# Patient Record
Sex: Female | Born: 1940 | Race: White | Hispanic: No | Marital: Married | Smoking: Former smoker
Health system: Southern US, Community
[De-identification: ages and names within clinical notes are randomized; demographics above are authoritative.]

## PROBLEM LIST (undated history)

## (undated) DIAGNOSIS — I82629 Acute embolism and thrombosis of deep veins of unspecified upper extremity: Secondary | ICD-10-CM

## (undated) DIAGNOSIS — H547 Unspecified visual loss: Secondary | ICD-10-CM

## (undated) DIAGNOSIS — M8580 Other specified disorders of bone density and structure, unspecified site: Secondary | ICD-10-CM

## (undated) DIAGNOSIS — I4719 Other supraventricular tachycardia: Secondary | ICD-10-CM

## (undated) DIAGNOSIS — R609 Edema, unspecified: Secondary | ICD-10-CM

## (undated) DIAGNOSIS — R011 Cardiac murmur, unspecified: Secondary | ICD-10-CM

## (undated) DIAGNOSIS — I83893 Varicose veins of bilateral lower extremities with other complications: Secondary | ICD-10-CM

## (undated) DIAGNOSIS — R296 Repeated falls: Secondary | ICD-10-CM

## (undated) DIAGNOSIS — I471 Supraventricular tachycardia: Secondary | ICD-10-CM

## (undated) DIAGNOSIS — S52022A Displaced fracture of olecranon process without intraarticular extension of left ulna, initial encounter for closed fracture: Secondary | ICD-10-CM

## (undated) DIAGNOSIS — K219 Gastro-esophageal reflux disease without esophagitis: Secondary | ICD-10-CM

## (undated) DIAGNOSIS — R519 Headache, unspecified: Secondary | ICD-10-CM

## (undated) DIAGNOSIS — R0600 Dyspnea, unspecified: Secondary | ICD-10-CM

## (undated) DIAGNOSIS — G8929 Other chronic pain: Secondary | ICD-10-CM

## (undated) DIAGNOSIS — I8390 Asymptomatic varicose veins of unspecified lower extremity: Secondary | ICD-10-CM

## (undated) DIAGNOSIS — T7840XA Allergy, unspecified, initial encounter: Secondary | ICD-10-CM

## (undated) DIAGNOSIS — I509 Heart failure, unspecified: Secondary | ICD-10-CM

## (undated) DIAGNOSIS — M47812 Spondylosis without myelopathy or radiculopathy, cervical region: Secondary | ICD-10-CM

## (undated) DIAGNOSIS — F32A Depression, unspecified: Secondary | ICD-10-CM

## (undated) DIAGNOSIS — R51 Headache: Secondary | ICD-10-CM

## (undated) DIAGNOSIS — B9689 Other specified bacterial agents as the cause of diseases classified elsewhere: Secondary | ICD-10-CM

## (undated) DIAGNOSIS — J45909 Unspecified asthma, uncomplicated: Secondary | ICD-10-CM

## (undated) DIAGNOSIS — N760 Acute vaginitis: Secondary | ICD-10-CM

## (undated) DIAGNOSIS — I10 Essential (primary) hypertension: Secondary | ICD-10-CM

## (undated) DIAGNOSIS — D649 Anemia, unspecified: Secondary | ICD-10-CM

## (undated) DIAGNOSIS — F329 Major depressive disorder, single episode, unspecified: Secondary | ICD-10-CM

## (undated) DIAGNOSIS — G629 Polyneuropathy, unspecified: Secondary | ICD-10-CM

## (undated) DIAGNOSIS — M797 Fibromyalgia: Secondary | ICD-10-CM

## (undated) DIAGNOSIS — E559 Vitamin D deficiency, unspecified: Secondary | ICD-10-CM

## (undated) DIAGNOSIS — J189 Pneumonia, unspecified organism: Secondary | ICD-10-CM

## (undated) HISTORY — PX: COLONOSCOPY: SHX174

## (undated) HISTORY — DX: Major depressive disorder, single episode, unspecified: F32.9

## (undated) HISTORY — PX: BACK SURGERY: SHX140

## (undated) HISTORY — PX: TONSILLECTOMY AND ADENOIDECTOMY: SUR1326

## (undated) HISTORY — DX: Varicose veins of bilateral lower extremities with other complications: I83.893

## (undated) HISTORY — DX: Unspecified asthma, uncomplicated: J45.909

## (undated) HISTORY — PX: WRIST SURGERY: SHX841

## (undated) HISTORY — PX: SHOULDER SURGERY: SHX246

## (undated) HISTORY — DX: Depression, unspecified: F32.A

## (undated) HISTORY — DX: Allergy, unspecified, initial encounter: T78.40XA

## (undated) HISTORY — DX: Unspecified visual loss: H54.7

## (undated) HISTORY — PX: KNEE SURGERY: SHX244

## (undated) HISTORY — DX: Spondylosis without myelopathy or radiculopathy, cervical region: M47.812

## (undated) HISTORY — DX: Acute embolism and thrombosis of deep veins of unspecified upper extremity: I82.629

## (undated) HISTORY — DX: Gastro-esophageal reflux disease without esophagitis: K21.9

## (undated) HISTORY — DX: Vitamin D deficiency, unspecified: E55.9

## (undated) HISTORY — DX: Acute vaginitis: N76.0

## (undated) HISTORY — PX: APPENDECTOMY: SHX54

## (undated) HISTORY — DX: Fibromyalgia: M79.7

## (undated) HISTORY — DX: Other specified bacterial agents as the cause of diseases classified elsewhere: B96.89

## (undated) HISTORY — PX: FOOT SURGERY: SHX648

## (undated) HISTORY — PX: FINGER SURGERY: SHX640

## (undated) HISTORY — DX: Other specified disorders of bone density and structure, unspecified site: M85.80

## (undated) HISTORY — DX: Edema, unspecified: R60.9

## (undated) HISTORY — DX: Other chronic pain: G89.29

## (undated) HISTORY — DX: Asymptomatic varicose veins of unspecified lower extremity: I83.90

---

## 1997-02-22 HISTORY — PX: ABDOMINAL HYSTERECTOMY: SHX81

## 1997-09-16 ENCOUNTER — Ambulatory Visit (HOSPITAL_COMMUNITY): Admission: RE | Admit: 1997-09-16 | Discharge: 1997-09-16 | Payer: Self-pay | Admitting: Obstetrics and Gynecology

## 1997-11-25 ENCOUNTER — Encounter: Payer: Self-pay | Admitting: Orthopedic Surgery

## 1997-11-25 ENCOUNTER — Ambulatory Visit (HOSPITAL_COMMUNITY): Admission: RE | Admit: 1997-11-25 | Discharge: 1997-11-25 | Payer: Self-pay | Admitting: Orthopedic Surgery

## 1998-02-22 HISTORY — PX: CATARACT EXTRACTION: SUR2

## 1998-10-26 ENCOUNTER — Encounter: Payer: Self-pay | Admitting: Emergency Medicine

## 1998-10-26 ENCOUNTER — Emergency Department (HOSPITAL_COMMUNITY): Admission: EM | Admit: 1998-10-26 | Discharge: 1998-10-26 | Payer: Self-pay | Admitting: Emergency Medicine

## 1998-12-29 ENCOUNTER — Ambulatory Visit (HOSPITAL_COMMUNITY): Admission: RE | Admit: 1998-12-29 | Discharge: 1998-12-29 | Payer: Self-pay | Admitting: Orthopedic Surgery

## 1999-01-03 ENCOUNTER — Inpatient Hospital Stay (HOSPITAL_COMMUNITY): Admission: EM | Admit: 1999-01-03 | Discharge: 1999-01-07 | Payer: Self-pay | Admitting: Emergency Medicine

## 1999-01-06 ENCOUNTER — Encounter: Payer: Self-pay | Admitting: Orthopedic Surgery

## 1999-03-13 ENCOUNTER — Other Ambulatory Visit: Admission: RE | Admit: 1999-03-13 | Discharge: 1999-03-13 | Payer: Self-pay | Admitting: Obstetrics and Gynecology

## 1999-03-25 ENCOUNTER — Encounter: Payer: Self-pay | Admitting: Obstetrics and Gynecology

## 1999-03-25 ENCOUNTER — Encounter: Admission: RE | Admit: 1999-03-25 | Discharge: 1999-03-25 | Payer: Self-pay | Admitting: Obstetrics and Gynecology

## 1999-04-07 ENCOUNTER — Encounter: Payer: Self-pay | Admitting: Orthopedic Surgery

## 1999-04-07 ENCOUNTER — Ambulatory Visit (HOSPITAL_COMMUNITY): Admission: RE | Admit: 1999-04-07 | Discharge: 1999-04-07 | Payer: Self-pay | Admitting: Orthopedic Surgery

## 1999-04-22 ENCOUNTER — Encounter: Admission: RE | Admit: 1999-04-22 | Discharge: 1999-04-22 | Payer: Self-pay | Admitting: Orthopedic Surgery

## 1999-04-22 ENCOUNTER — Encounter: Payer: Self-pay | Admitting: Orthopedic Surgery

## 1999-06-09 ENCOUNTER — Encounter: Admission: RE | Admit: 1999-06-09 | Discharge: 1999-06-09 | Payer: Self-pay | Admitting: *Deleted

## 1999-06-09 ENCOUNTER — Encounter: Payer: Self-pay | Admitting: Rheumatology

## 2000-01-22 ENCOUNTER — Encounter: Payer: Self-pay | Admitting: Orthopedic Surgery

## 2000-01-22 ENCOUNTER — Encounter: Admission: RE | Admit: 2000-01-22 | Discharge: 2000-01-22 | Payer: Self-pay | Admitting: Orthopedic Surgery

## 2000-03-14 ENCOUNTER — Other Ambulatory Visit: Admission: RE | Admit: 2000-03-14 | Discharge: 2000-03-14 | Payer: Self-pay | Admitting: Obstetrics and Gynecology

## 2000-06-08 ENCOUNTER — Encounter: Payer: Self-pay | Admitting: Obstetrics and Gynecology

## 2000-06-08 ENCOUNTER — Encounter: Admission: RE | Admit: 2000-06-08 | Discharge: 2000-06-08 | Payer: Self-pay | Admitting: Obstetrics and Gynecology

## 2001-04-17 ENCOUNTER — Other Ambulatory Visit: Admission: RE | Admit: 2001-04-17 | Discharge: 2001-04-17 | Payer: Self-pay | Admitting: Gynecology

## 2001-06-29 ENCOUNTER — Ambulatory Visit (HOSPITAL_COMMUNITY): Admission: RE | Admit: 2001-06-29 | Discharge: 2001-06-29 | Payer: Self-pay | Admitting: Gastroenterology

## 2001-11-02 ENCOUNTER — Emergency Department (HOSPITAL_COMMUNITY): Admission: EM | Admit: 2001-11-02 | Discharge: 2001-11-02 | Payer: Self-pay

## 2002-04-18 ENCOUNTER — Other Ambulatory Visit: Admission: RE | Admit: 2002-04-18 | Discharge: 2002-04-18 | Payer: Self-pay | Admitting: Gynecology

## 2002-04-23 ENCOUNTER — Encounter: Payer: Self-pay | Admitting: Gynecology

## 2002-04-23 ENCOUNTER — Encounter: Admission: RE | Admit: 2002-04-23 | Discharge: 2002-04-23 | Payer: Self-pay | Admitting: Gynecology

## 2002-06-04 ENCOUNTER — Emergency Department (HOSPITAL_COMMUNITY): Admission: EM | Admit: 2002-06-04 | Discharge: 2002-06-04 | Payer: Self-pay

## 2002-06-04 ENCOUNTER — Encounter: Payer: Self-pay | Admitting: Emergency Medicine

## 2002-06-04 ENCOUNTER — Encounter: Payer: Self-pay | Admitting: Orthopedic Surgery

## 2002-12-04 ENCOUNTER — Ambulatory Visit: Admission: RE | Admit: 2002-12-04 | Discharge: 2002-12-04 | Payer: Self-pay | Admitting: Orthopedic Surgery

## 2003-02-01 ENCOUNTER — Ambulatory Visit (HOSPITAL_COMMUNITY): Admission: RE | Admit: 2003-02-01 | Discharge: 2003-02-01 | Payer: Self-pay | Admitting: Orthopedic Surgery

## 2003-05-08 ENCOUNTER — Other Ambulatory Visit: Admission: RE | Admit: 2003-05-08 | Discharge: 2003-05-08 | Payer: Self-pay | Admitting: Gynecology

## 2003-05-24 ENCOUNTER — Ambulatory Visit (HOSPITAL_BASED_OUTPATIENT_CLINIC_OR_DEPARTMENT_OTHER): Admission: RE | Admit: 2003-05-24 | Discharge: 2003-05-24 | Payer: Self-pay | Admitting: Orthopedic Surgery

## 2003-07-23 ENCOUNTER — Encounter: Admission: RE | Admit: 2003-07-23 | Discharge: 2003-07-23 | Payer: Self-pay | Admitting: Internal Medicine

## 2003-08-13 ENCOUNTER — Ambulatory Visit (HOSPITAL_COMMUNITY): Admission: RE | Admit: 2003-08-13 | Discharge: 2003-08-13 | Payer: Self-pay | Admitting: Orthopedic Surgery

## 2003-10-06 ENCOUNTER — Observation Stay (HOSPITAL_COMMUNITY): Admission: EM | Admit: 2003-10-06 | Discharge: 2003-10-08 | Payer: Self-pay | Admitting: Emergency Medicine

## 2003-12-03 ENCOUNTER — Inpatient Hospital Stay (HOSPITAL_COMMUNITY): Admission: RE | Admit: 2003-12-03 | Discharge: 2003-12-08 | Payer: Self-pay | Admitting: Orthopedic Surgery

## 2003-12-04 ENCOUNTER — Ambulatory Visit: Payer: Self-pay | Admitting: Physical Medicine & Rehabilitation

## 2004-01-07 ENCOUNTER — Encounter: Admission: RE | Admit: 2004-01-07 | Discharge: 2004-04-06 | Payer: Self-pay | Admitting: Orthopedic Surgery

## 2004-04-29 ENCOUNTER — Other Ambulatory Visit: Admission: RE | Admit: 2004-04-29 | Discharge: 2004-04-29 | Payer: Self-pay | Admitting: Gynecology

## 2004-07-23 ENCOUNTER — Encounter: Admission: RE | Admit: 2004-07-23 | Discharge: 2004-07-23 | Payer: Self-pay | Admitting: Gynecology

## 2004-08-21 ENCOUNTER — Emergency Department (HOSPITAL_COMMUNITY): Admission: EM | Admit: 2004-08-21 | Discharge: 2004-08-21 | Payer: Self-pay | Admitting: Emergency Medicine

## 2004-09-15 ENCOUNTER — Observation Stay (HOSPITAL_COMMUNITY): Admission: RE | Admit: 2004-09-15 | Discharge: 2004-09-16 | Payer: Self-pay | Admitting: Orthopedic Surgery

## 2004-10-29 ENCOUNTER — Encounter: Admission: RE | Admit: 2004-10-29 | Discharge: 2005-01-27 | Payer: Self-pay | Admitting: Orthopedic Surgery

## 2005-02-02 ENCOUNTER — Encounter: Admission: RE | Admit: 2005-02-02 | Discharge: 2005-05-03 | Payer: Self-pay | Admitting: Orthopedic Surgery

## 2005-02-22 HISTORY — PX: NECK SURGERY: SHX720

## 2005-02-22 HISTORY — PX: REFRACTIVE SURGERY: SHX103

## 2005-03-24 ENCOUNTER — Encounter: Admission: RE | Admit: 2005-03-24 | Discharge: 2005-03-24 | Payer: Self-pay | Admitting: Anesthesiology

## 2005-08-22 LAB — HM DEXA SCAN

## 2005-09-21 ENCOUNTER — Encounter: Admission: RE | Admit: 2005-09-21 | Discharge: 2005-09-21 | Payer: Self-pay | Admitting: Family Medicine

## 2006-03-28 ENCOUNTER — Encounter: Admission: RE | Admit: 2006-03-28 | Discharge: 2006-03-28 | Payer: Self-pay | Admitting: Family Medicine

## 2006-08-17 ENCOUNTER — Ambulatory Visit (HOSPITAL_COMMUNITY): Admission: RE | Admit: 2006-08-17 | Discharge: 2006-08-17 | Payer: Self-pay | Admitting: Anesthesiology

## 2006-12-13 ENCOUNTER — Encounter: Admission: RE | Admit: 2006-12-13 | Discharge: 2006-12-13 | Payer: Self-pay | Admitting: Chiropractic Medicine

## 2007-02-03 ENCOUNTER — Encounter: Admission: RE | Admit: 2007-02-03 | Discharge: 2007-02-03 | Payer: Self-pay | Admitting: Anesthesiology

## 2007-07-24 HISTORY — PX: SPINAL FUSION: SHX223

## 2008-08-27 ENCOUNTER — Inpatient Hospital Stay (HOSPITAL_COMMUNITY): Admission: EM | Admit: 2008-08-27 | Discharge: 2008-08-29 | Payer: Self-pay | Admitting: Emergency Medicine

## 2009-05-12 ENCOUNTER — Encounter: Admission: RE | Admit: 2009-05-12 | Discharge: 2009-05-12 | Payer: Self-pay | Admitting: Orthopedic Surgery

## 2009-05-21 ENCOUNTER — Ambulatory Visit (HOSPITAL_BASED_OUTPATIENT_CLINIC_OR_DEPARTMENT_OTHER): Admission: RE | Admit: 2009-05-21 | Discharge: 2009-05-21 | Payer: Self-pay | Admitting: Orthopedic Surgery

## 2009-08-30 ENCOUNTER — Encounter: Admission: RE | Admit: 2009-08-30 | Discharge: 2009-08-30 | Payer: Self-pay | Admitting: Anesthesiology

## 2009-09-03 ENCOUNTER — Encounter: Admission: RE | Admit: 2009-09-03 | Discharge: 2009-09-03 | Payer: Self-pay | Admitting: Orthopaedic Surgery

## 2009-11-27 ENCOUNTER — Encounter: Admission: RE | Admit: 2009-11-27 | Discharge: 2009-11-27 | Payer: Self-pay | Admitting: Orthopaedic Surgery

## 2010-03-15 ENCOUNTER — Encounter: Payer: Self-pay | Admitting: Internal Medicine

## 2010-05-18 LAB — WOUND CULTURE: Culture: NO GROWTH

## 2010-05-18 LAB — ANAEROBIC CULTURE

## 2010-05-31 LAB — CBC
HCT: 32.1 % — ABNORMAL LOW (ref 36.0–46.0)
HCT: 34.4 % — ABNORMAL LOW (ref 36.0–46.0)
Hemoglobin: 10.7 g/dL — ABNORMAL LOW (ref 12.0–15.0)
Hemoglobin: 11.7 g/dL — ABNORMAL LOW (ref 12.0–15.0)
MCHC: 33.3 g/dL (ref 30.0–36.0)
Platelets: 404 10*3/uL — ABNORMAL HIGH (ref 150–400)
RBC: 3.88 MIL/uL (ref 3.87–5.11)
RDW: 13.8 % (ref 11.5–15.5)
WBC: 5.8 10*3/uL (ref 4.0–10.5)

## 2010-05-31 LAB — DIFFERENTIAL
Basophils Absolute: 0 10*3/uL (ref 0.0–0.1)
Basophils Relative: 1 % (ref 0–1)
Eosinophils Relative: 2 % (ref 0–5)
Eosinophils Relative: 2 % (ref 0–5)
Lymphocytes Relative: 30 % (ref 12–46)
Lymphocytes Relative: 35 % (ref 12–46)
Lymphs Abs: 1.8 10*3/uL (ref 0.7–4.0)
Monocytes Absolute: 0.5 10*3/uL (ref 0.1–1.0)
Monocytes Absolute: 0.5 10*3/uL (ref 0.1–1.0)
Monocytes Relative: 8 % (ref 3–12)

## 2010-05-31 LAB — BASIC METABOLIC PANEL
BUN: 16 mg/dL (ref 6–23)
BUN: 19 mg/dL (ref 6–23)
CO2: 27 mEq/L (ref 19–32)
Calcium: 8.7 mg/dL (ref 8.4–10.5)
Calcium: 9.6 mg/dL (ref 8.4–10.5)
Chloride: 107 mEq/L (ref 96–112)
Creatinine, Ser: 0.7 mg/dL (ref 0.4–1.2)
GFR calc Af Amer: >60 mL/min (ref >60)
GFR calc non Af Amer: >60 mL/min (ref >60)
GFR calc non Af Amer: >60 mL/min (ref >60)
Glucose, Bld: 103 mg/dL — ABNORMAL HIGH (ref 70–99)
Glucose, Bld: 89 mg/dL (ref 70–99)

## 2010-05-31 LAB — GRAM STAIN

## 2010-05-31 LAB — CULTURE, ROUTINE-ABSCESS

## 2010-05-31 LAB — ANAEROBIC CULTURE

## 2010-05-31 LAB — SEDIMENTATION RATE: Sed Rate: 101 mm/hr — ABNORMAL HIGH (ref 0–22)

## 2010-07-07 NOTE — Op Note (Signed)
NAMEADAM, SCHEPPS                 ACCOUNT NO.:  [id number]   MEDICAL RECORD NO.:  [id number]          PATIENT TYPE:  INP   LOCATION:  [place]                         FACILITY:  [facility]   PHYSICIAN:  Doran Heater. Veverly Fells, M.D. DATE OF BIRTH:  04-25-1940   DATE OF PROCEDURE:  08/27/2008  DATE OF DISCHARGE:                               OPERATIVE REPORT   PREOPERATIVE DIAGNOSIS:  Right foot infection.   POSTOPERATIVE DIAGNOSIS:  Right foot infection.   PROCEDURE PERFORMED:  Irrigation and debridement of right foot, deep.   ATTENDING SURGEON:  Doran Heater. Veverly Fells, M.D.   ASSISTANT:  None.   ANESTHESIA:  General anesthesia was used.   ESTIMATED BLOOD LOSS:  Minimal.   FLUID REPLACEMENT:  Crystalloid 100 mL.   TOURNIQUET TIME:  Eighteen minutes at 300 mmHg.   INSTRUMENT COUNT:  Correct.   COMPLICATIONS:  None.   Perioperative antibiotics were given after cultures obtained.   INDICATIONS:  The patient is a 70 year old female about 2 months out  from a right foot surgery from Dr. Weber Cooks.  The patient had, had a  bunionectomy and a metatarsal osteotomy.  The patient had developed a  cellulitis that she was seen for last week by Dr. Beola Cord.  The patient  was placed on warm soaks and also on oral antibiotics.  The patient was  seen last Tuesday, which was 1 week ago.  The patient subsequently  stated by Friday her foot had become more swollen and more red.  She  thought that she could wait through the weekend.  Unfortunately, over  the weekend, her foot got worse and worse to the point where she was  having debilitating pain, extreme swelling and redness to the plantar  aspect of the metatarsophalangeal joint area.  The patient was seen in  the emergency department and was noted to have desquamation of the  plantar aspect of the first metatarsophalangeal joint with severe  swelling and erythema.  The patient is now ready for surgery for I and  D.  Informed consent obtained.   DESCRIPTION OF SURGERY:  After an adequate level of anesthesia was  achieved the patient had a nonsterile tourniquet placed on the right  proximal thigh.  The right leg was sterilely prepped and draped in the  usual manner.  After elevating the limb for 3 minutes, we elevated the  tourniquet to 300 mmHg.  A medial skin incision was created over the  medial border of the foot over the metatarsophalangeal joint of the  great toe.  I immediately encountered pus which was coming from the  plantar pad area underneath the metatarsophalangeal joint.  There was  quite a bit of purulent material expressed.  The desquamated skin was  taken off sharply with scissors.  There was noted to be hole in the  plantar aspect of her foot that communicated with the infected area  beneath the joint.  Her joint was not involved.  We did a sharp  debridement initially of all nonviable tissue.  We were careful in  trying to preserve the dorsal nerve root there  on that medial side.  Again, all nonviable tissue was removed.  We thoroughly irrigated.  Then  we went ahead and injected the metatarsophalangeal joint from a dorsal  approach with saline that did not extravasate into the wound, thus the  joint was closed and we did not make any attempt to open the joint.  The  joint looked normal on x-ray.  Following a third-degree debridement of  the plantar pad the skin surrounding the area where the foot opened up  underneath and from the side wound, we pulse irrigated with 3 liters of  normal saline irrigation.  We then placed a gauze sponge from the medial  wound through the plantar wound as a drain and then placed dry sponges  between the toes.  A large bulky bandage for the foot and then the foot  and ankle placed in a short-leg splint.  The patient tolerated the  procedure well.  Tourniquet deflated at 18 minutes and cultures were  obtained including stat Gram stain, aerobic and anaerobic cultures.  We  will await  those culture results and start her on empiric Unasyn  starting right after the culturing.      Doran Heater. Veverly Fells, M.D.  Electronically Signed     SRN/MEDQ  D:  08/27/2008  T:  08/28/2008  Job:  [id number]   cc:   Weber Cooks, M.D.  Fax: [phone]

## 2010-07-10 NOTE — Op Note (Signed)
[name], [name]                 ACCOUNT NO.:  [id number]   MEDICAL RECORD NO.:  [id number]          PATIENT TYPE:  AMB   LOCATION:  DAY                          FACILITY:  [facility]   PHYSICIAN:  Kipp Brood. Gioffre, M.D.DATE OF BIRTH:  1941/01/24   DATE OF PROCEDURE:  09/15/2004  DATE OF DISCHARGE:                                 OPERATIVE REPORT   SURGEON:  Kipp Brood. Gladstone Lighter, M.D.   ASSISTANT:  Elby Showers. Paitsel, P.A.   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 She previously had rotator cuff tendon repair with Mitek anchor  sutures by another surgeon in town in 1999. She did well for several years  and then suddenly developed pain and limited motion of her shoulder on the  left.   POSTOPERATIVE DIAGNOSIS:  Recurrent tear of the previous repaired rotator  cuff tendon on the left shoulder.   OPERATION:  1.  Partial acromionectomy and acromioplasty, left shoulder.  2.  Lysis of adhesions, left shoulder.  3.  Repair of a previously torn and repaired rotator cuff tendon, left      shoulder, utilizing the Restore tendon graft.   PROCEDURE:  Prior to general anesthesia, the patient had interscalene nerve  block. Following that, she was brought back to the operating room, and  general anesthetic and a routine orthopedic prepping and draping of her left  shoulder was carried out. She has 1 gram of IV Ancef. An incision was made  through the old incision site. Bleeders identified and cauterized. I then  went down and sized the deltoid tendon and the proximal portion of the  deltoid muscle. This was quite scarred down. I freed the area up, and then I  literally incised the adhesions in the subacromial space. Once this was  done, I noted her acromion was sharp and down growing, so I went ahead and  protected the underlying tendon and did a partial acromionectomy and an  acromioplasty in usual fashion. Following this, I exerted some traction on  the  arm and then identified a large longitudinal tendon proximally. I sewed  the first tear end to tendon proximally. I sutured that with #1 Ethibond  suture. The remaining tendon distally was torn and shredded, and I simply  reinforced with a Restore tendon graft. The proximal deltoid tendon portion  of that was absent from a previous scar and surgery, and I reinforced that  with a portion of the Restore tendon graft as well. The remaining part of  the wound was closed in layered usual fashion after we thoroughly irrigated  out the shoulder. Skin was closed with metal staples. Sterile dressings were  applied. She was placed in a shoulder immobilizer.       RAG/MEDQ  D:  09/15/2004  T:  09/15/2004  Job:  [id number]

## 2010-07-10 NOTE — Op Note (Signed)
NAMEANI, PATENAUDE                 ACCOUNT NO.:  [id number]   MEDICAL RECORD NO.:  [id number]          PATIENT TYPE:  INP   LOCATION:  [place]                         FACILITY:  [facility]   PHYSICIAN:  Kipp Brood. Gioffre, M.D.DATE OF BIRTH:  [birth year]   DATE OF PROCEDURE:  12/03/2003  DATE OF DISCHARGE:                                 OPERATIVE REPORT   SURGEON:  Dr. Gladstone Lighter   ASSISTANT:  Dr. 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   OPERATION:  Right total knee arthroplasty, utilizing the Osteonics system.  The sizes used:  A size 26 recessed patella, a size 7 right posterior  stabilized femoral component.  The tibial component was a size 7 tray with a  15 mm thickness flex tibial insert.  We did use vancomycin in the cement.   DESCRIPTION OF PROCEDURE:  Under general anesthesia, first of all the  anesthesiologists attempted a spinal.  They were not able to perform a  spinal, so they converted to do a general anesthetic.  At this time, the  patient had 1 g of IV Ancef.  Following that, a routine orthopedic prep  draping of the right knee was carried out.  An incision was made over the  anterior aspect of the right knee.  Bleeders were identified and cauterized.  Following that, a median parapatellar incision was created, and then the  patella was reflected laterally.  The knee was flexed.  I then carried out  medial and lateral meniscectomies and excised the anterior and posterior  cruciate ligaments.  Following this, we first made our initial drill hole in  the intercondylar notch.  The intramedullary guide rod was inserted with the  #1 jig.  We then removed 12 mm thickness off the distal femur.  After that,  the #2 jig was inserted, and we then went on and cut our anterior,  posterior, and chamfering cuts.  Following this, we prepared the tibia by  remaining  4 mm thickness off of the affected medial side of the tibia.  The  medial tibial plateau was much more depressed than the lateral, despite how  the x-rays looked.  After this, we then cut our patellar notch cut out of  the distal femur and our intercondylar notch cut as well.  We measured for a  size 7 right femoral component, posterior cruciate sacrificing type.  Following that, we then inserted our trials and went through a trial range  of motion and selected the 15 mm thickness tibial tray.  After this was  completed, we then cut our patella.  We utilized a recessed patella  component.  We removed 10 mm thickness off the articular surface of the  patella, and three drill holes were made in the patella.  Following this,  after we went through a range of motion, we then cut our keel cut out of the  proximal tibial metaphysis.  We thoroughly irrigated out the area and then  waterpicked the knee out,  dried the knee out, and then cemented all three  components in simultaneously.  Vancomycin was used in the cement.  All loose  pieces of cement were removed.  At this time, we then went through a range  of motion once again with first a 12 mm thickness tibial insert, then a 15  mm tibial thickness insert, and the 15 mm thickness insert was the most  stable.  We then removed the trials, waterpicked the knee out, dried the  knee out, and then inserted our permanent size 7, 15 mm thickness  tibial insert.  It was the flex-type insert.  We reduced the knee and  irrigated the knee out again and then inserted our Hemovac drain and closed  the knee in layers in the usual fashion.  Sterile Neosporin dressing was  applied.  The patient left the operating room in satisfactory condition.      RAG/MEDQ  D:  12/03/2003  T:  12/03/2003  Job:  [id number]   cc:   Ardeen Jourdain, M.D.  [address]  [address]  [place] [address]  Fax: [phone]

## 2010-07-10 NOTE — H&P (Signed)
NAMEMADISEN, Shirley [name]                 ACCOUNT NO.:  [id number]   MEDICAL RECORD NO.:  [id number]          PATIENT TYPE:  INP   LOCATION:  NA                           FACILITY:  [facility]   PHYSICIAN:  Kipp Brood. Gioffre, M.D.DATE OF BIRTH:  Jul 10, 1940   DATE OF ADMISSION:  DATE OF DISCHARGE:                                HISTORY & PHYSICAL   HISTORY:  The patient has had right knee pain for the past several years.  She has had increasing pain for the past few months. It is very difficult  for her to ambulate. She has gone through a series of Synvisc injections  without relief. She has also tried nonsteroidal antiinflammatory medication  with no relief. Due to the patient's degenerative arthritis, she plans to  proceed with a right total knee arthroplasty.   ALLERGIES:  MORPHINE caused a rash.   PRIMARY CARE PHYSICIAN:  Ardeen Jourdain, M.D.   PAST MEDICAL HISTORY:  Significant for bronchitis, blood clot in left arm,  migraine headaches, anxiety, osteoarthritis, neuropathy in her feet and  hiatal hernia.   CURRENT MEDICATIONS:  1.  Xanax 1 mg t.i.d.  2.  Neurontin 300 mg q.i.d.  3.  Cymbalta 60 mg daily.  4.  Oxycodone 20 mg t.i.d.   PAST SURGICAL HISTORY:  The patient had back surgery in 1977, right rotator  cuff repair in 1994, foot surgery in 1997.  Right rotator cuff repair in  2003, arthroscopic surgery right knee 2005, hysterectomy 1999.   FAMILY HISTORY:  Father heart disease and bladder cancer. Mother arthritis.   REVIEW OF SYMPTOMS:  GENERAL:  Denies weight change, fever, chills or  fatigue. HEENT:  Denies headache, visual changes, __________ or sore throat.  CARDIOVASCULAR:  Denies chest pain, palpitations, shortness of breath,  orthopnea.  PULMONARY:  Denies dyspnea, wheezing, cough, sputum production,  hemoptysis. GU:  Denies dysuria, frequency, urgency or hematuria. ENDOCRINE:  Denies polyuria, polydipsia, appetite change, heat or cold intolerance.  MUSCULOSKELETAL:  The patient has pain in her right knee. NEUROLOGIC:  Denies dizziness, vertigo, syncope, seizure.  SKIN:  Denies itching, rashes,  masses or moles.   PHYSICAL EXAMINATION:  VITAL SIGNS:  Temperature is 98.6, pulse is 82,  respirations 18, blood pressure 140/80 right arm sitting.  GENERAL:  A 70 year old female in no acute distress.  HEENT:  PERRL.  EOM's intact.  Pharynx clear.  NECK:  Supple without masses.  CHEST:  Clear to auscultation bilaterally.  No wheezing, rales or rhonchi  noted.  HEART:  Regular rate and rhythm without murmur.  ABDOMEN:  Positive bowel sounds, soft, nontender.  EXTREMITIES:  Examination of her right knee reveals valgus deformity, lacks  20 degrees of flexion, 5 degrees of extension. Skin is warm and dry.  X-ray  of her right knee reveals collapse in the lateral joints with a valgus  deformity.   IMPRESSION:  Degenerative arthritis right knee.   PLAN:  The patient is to be admitted to [facility] to undergo a  right total knee arthroplasty December 03, 2003.      LKP/MEDQ  D:  11/29/2003  T:  11/29/2003  Job:  [id number]

## 2010-07-10 NOTE — Discharge Summary (Signed)
NAMEPUNEET, [name]                 ACCOUNT NO.:  [id number]   MEDICAL RECORD NO.:  [id number]          PATIENT TYPE:  REC   LOCATION:  [place]                         FACILITY:  [place]   PHYSICIAN:  Ronald A. Gioffre, M.D.DATE OF BIRTH:  1940-07-04   DATE OF ADMISSION:  01/07/2004  DATE OF DISCHARGE:                                 DISCHARGE SUMMARY   ADMISSION DIAGNOSES:  1.  Degenerative arthritis right knee.  2.  Bronchitis.  3.  History of blood clot in left arm.  4.  Migraine headaches.  5.  Anxiety.  6.  Neuropathy in both feet.  7.  Hiatal hernia.   DISCHARGE DIAGNOSES:  1.  Degenerative arthritis right knee, status post right total knee      arthroplasty.  2.  Chronic bronchitis.  3.  Blood clot in left arm.  4.  Migraine headaches.  5.  Anxiety.  6.  Neuropathy in both feet.  7.  Hiatal hernia.   PROCEDURE:  The patient was taken to the operating room on December 03, 2003,  to undergo a right total knee arthroplasty.  Surgeon, Kipp Brood. Gladstone Lighter,  M.D.  Assistant, Pietro Cassis. Alvan Dame, M.D.  Surgery was performed under general  anesthesia.  A Hemovac drain was placed at the time of surgery.   CONSULTATIONS:  PT, OT and rehab.   HISTORY OF PRESENT ILLNESS:  This patient is a 70 year old female who has  had right knee pain for the past several years.  She has had increasing pain  for the past few months.  Pain is making it very difficult for her to  ambulate.  She has gone through a series of Synvisc injections without  relief.  She has also tried nonsteroidal anti-inflammatory medication with  no relief.  Due to the patient's degenerative arthritis, she plans to  proceed with a right total knee arthroplasty.  The patient was admitted to  the hospital for same.   LABORATORY DATA:  Admission CBC with wbc 6.7, rbc 3.75, hemoglobin 10.7,  hematocrit 32.3, platelet count 565,  slightly elevated.  Admission PT 11.6,  INR 0.8, PTT 31.  Admission chemistry with sodium 139,  potassium 3.9,  chloride 102, CO2 27, glucose 166 (elevated), BUN 12, creatinine 0.8.  Calcium 9.1, total protein 6.6, albumin 3.6.  Admission urinalysis normal.  The patient's blood type is A negative.  Negative antibody screen.   Preoperative x-ray of right knee reveals severe osteoarthritic changes  primarily lateral compartment, small joint effusion.  Postoperative x-ray of  right knee reveals right total knee prosthesis in good alignment.   I do not see the patient's EKG or chest x-ray on the medical record.   HOSPITAL COURSE:  The patient was admitted to [facility].  She was  taken to the operating room.  She underwent the above stated procedure  without complications.  She tolerated the procedure well and was allowed to  return to the recovery room and then the orthopedic floor to continue  postoperative care.  Hemovac drain was placed at the time of surgery.  Hemovac drain was  discontinued on postoperative day #1.  The patient's  hemoglobin and hematocrit were followed throughout hospitalization.  She had  a postoperative drop in her hemoglobin to 8.3.  She was transfused with two  units of packed red blood cells and her hemoglobin stabilized to 9.3 prior  to discharge.  The patient was placed on PC analgesia for pain control.  Patient was gradually weaned over to oral analgesics and PCA was  discontinued on postoperative day #3.  PT was consulted for consulted for  gait training ambulation.  The patient was able to ambulate greater than 100  feet by postoperative day #5 and we felt that she was too high functioning  for rehab and that she could safely go home.  The patient was discharged  home on December 06, 2003.   DISCHARGE MEDICATIONS:  1.  Percocet 10/650 one or two every six hours as needed for pain.  2.  Robaxin 500 mg one every six hours as needed for muscle spasm.  3.  Coumadin as dosed per pharmacy.  4.  Trinsicon one tablet twice daily for two weeks.    ACTIVITY:  Full weightbearing with walker.   WOUND CARE:  Daily dressing changes.   DISCHARGE INSTRUCTIONS:  The patient may shower but no tub baths.   FOLLOW UP:  The patient scheduled to follow up with Jori Moll A. Gioffre, M.D.,  two weeks from the date of surgery.   CONDITION ON DISCHARGE:  Stable.      LKP/MEDQ  D:  01/08/2004  T:  01/09/2004  Job:  [id number]

## 2010-07-10 NOTE — Op Note (Signed)
NAME:  Shirley [name], Shirley [name]                           ACCOUNT NO.:  [id number]   MEDICAL RECORD NO.:  [id number]                   PATIENT TYPE:  AMB   LOCATION:  [facility]                                  FACILITY:  [facility]   PHYSICIAN:  Yvette Rack., M.D.             DATE OF BIRTH:  05-25-1940   DATE OF PROCEDURE:  05/24/2003  DATE OF DISCHARGE:                                 OPERATIVE REPORT   INDICATIONS FOR PROCEDURE:  70 year old who, over a year ago, was injured  suffering a knee injury believed as a result of a fall in a store with  resulting pain.  She continued to complain of pain leading to MRI which  showed torn meniscus despite conservative treatment, she continued to  complain of pain and catching thought to be amenable to outpatient surgery.   PREOPERATIVE DIAGNOSIS:  Torn lateral meniscus, right knee, with grade 3  chondromalacia of lateral compartment, medial compartment, and  patellofemoral joint.   POSTOPERATIVE DIAGNOSIS:  Torn lateral meniscus, right knee, with grade 3  chondromalacia of lateral compartment, medial compartment, and  patellofemoral joint.   OPERATION:  1. Partial lateral meniscectomy.  2. Debridement chondroplasty (tricompartmental).   SURGEON:  Lockie Pares, M.D.   ANESTHESIA:  General.   DESCRIPTION OF PROCEDURE:  Arthroscope through an inferomedial and  inferolateral portal.  Systematic inspection of the knee showed the patient  to have grade 4 chondromalacia on one spot on the lateral plateau overlying  a complex tear of the meniscus which required resection of probably more a  peripheral tear of the mid third of the lateral meniscus leaving a good  portion of the anterior and posterior horn intact, but there was a  significant tear and generalized grade 3 changes on the femoral condyle,  grade 3 changes on the lateral compartment, with one small 5 by 5 mm area of  grade 4 changes.  The medial compartment showed no meniscal tear but,  rather,  a more localized lesion of the condyle, grade 3, probably 1 by 1 cm,  which was debrided, and the patellofemoral joint, particularly on the  patellar side, showed generalized advanced grade 3 changes which were  aggressively debrided.  Again, all three compartments were debrided, partial  lateral meniscectomy estimated probably left approximately 1/2 of the  meniscus intact on the lateral side.  The knee drained free of fluid.  The  portals were closed.  Accessory superolateral portal was made for better  access to the lateral compartment.  All three portals were closed.  The knee  was infiltrated with Marcaine and morphine, an addition 40 mg Depo-Medrol.  The patient went to the recovery room in stable condition.  Yvette Rack., M.D.    WDC/MEDQ  D:  05/24/2003  T:  05/24/2003  Job:  [id number]

## 2010-07-10 NOTE — H&P (Signed)
[facility]  Patient:    Shirley [name]                         MRN: [id number] Adm. Date:  [id number] Attending:  Lara Mulch                         History and Physical  HISTORY OF PRESENT ILLNESS:  The patient is a 70 year old white female who has ad several left foot surgeries over the past several years.  The first couple at [facility], and then also by W. Carmine Savoy., M.D.  It appears that she as had an MTP fusion.  She has had intermittent swelling and returned to see  Dr. French Ana this past week.  Dr. French Ana was concerned with her infection, and ad blood work drawn and it was sent, and was scheduled for a three phase bone scan. The patient started to have extreme amounts of swelling in the last 48 hours. ame to the emergency room due to increasing pain, redness and swelling.  PAST MEDICAL HISTORY:  Previous foot surgeries and migraine headaches.  ALLERGIES:  NONE.  REVIEW OF SYSTEMS:  Only with current problem.  PHYSICAL EXAMINATION:  HEENT:  Benign.  CHEST:  Clear to auscultation and percussion bilaterally.  ABDOMEN:  No palpable masses, normal bowel sounds.  VASCULAR EXAMINATION:  Normal.  MUSCULOSKELETAL EXAMINATION:  Upper extremities normal.  Lower extremities - left leg with 2-3+ swelling and erythema, and swelling continuing up the calf and into the thigh.  Soreness in the hamstrings and calf musculature.  Grossly neurovascularly intact.  X-rays show hardware with plate, screws, and pins. All seem to be in fairly reasonable conditioning except for a single pin which was broken and seems to be running towards the plantar aspect under the first MTP joint.  No frank evidence of osteomyelitis.  DIAGNOSIS:  Infection, cellulitis versus abscess versus osteomyelitis versus combination.  PLAN: 1. Aspiration in the emergency room under sterile conditions revealed no frank    purulence. 2. Admit for IV  antibiotics. 3. Noninvasive Doppler to rule out DVT. 4. Schedule for three phase bone scan since there is no frank purulence around he    foot, and we need to rule out an osteomyelitis. DD:  01/04/99 TD:  01/06/99 Job: [id number]

## 2010-07-10 NOTE — Procedures (Signed)
[facility]  Patient:    Shirley [name], Shirley [name] Visit Number: [id number] MRN: [id number]          Service Type: END Location: ENDO Attending Physician:  Orvis Brill Dictated by:   Jeryl Columbia, M.D. Proc. Date: 06/29/01 Admit Date:  06/29/2001   CC:         Nadene Rubins, M.D.  Selinda Orion, M.D.   Procedure Report  PROCEDURE:  Colonoscopy.  INDICATION:  Anemia in a patient due for colonic screening. Consent was signed after risk, benefits, methods, and options were thoroughly discussed in the office.  MEDICINES USED:  Fentanyl 175 mg, Versed 16 mg.  DESCRIPTION OF PROCEDURE:  Rectal inspection was pertinent for external hemorrhoids. Digital exam was negative. The video pediatric adjustable colonoscope was inserted and with some difficulty due to a tortuous colon was able to be advanced to the cecum. This did require rolling her on her back and various abdominal pressures. No obvious abnormalities or signs of bleeding were seen on insertion. The cecum was identified by the appendiceal orifice and the ileocecal valve. _______ the scope was inserted shortways into the terminal ileum which was normal. Photo documentation was obtained. The scope was slowly withdrawn. The prep was adequate. There was some liquid stool that required washing and suctioning. On slow withdrawal through the colon, other than the tortuosity and the flexures in the sigmoid, no abnormalities were seen. Once back in the rectum, the scope was then retroflexed pertinent for some internal hemorrhoids. The scope was drained and readvanced the shortways up the sigmoid. Air was suctioned and the scope removed. The patient tolerated the procedure well. There was no obvious immediate complication.  ENDOSCOPIC DIAGNOSES: 1. Internal/external hemorrhoids. 2. Tortuous sigmoid and flexures. 3. Otherwise within normal limits to the terminal ileum.  PLAN:  Would be happy to see  back p.r.n., otherwise recommend Dr. Tamala Julian to recheck guaiac, CBCs and decide if any other GI workup is needed, like a one-time upper GI/small bowel follow-through. Happy to orchestrate that if you wish; otherwise, probably repeat screening in five years. Might consider an air contrast barium enema since could _______ tortuosity or Diprivan due to her increased narcotic needs based on her chronic narcotics and benzodiazepines. Dictated by:   Jeryl Columbia, M.D. Attending Physician:  Orvis Brill DD:  06/29/01 TD:  07/01/01 Job: [id number]

## 2010-07-10 NOTE — H&P (Signed)
NAME:  Shirley [name], Shirley [name]                           ACCOUNT NO.:  [id number]   MEDICAL RECORD NO.:  [id number]                   PATIENT TYPE:  INP   LOCATION:  3015                                 FACILITY:  [facility]   PHYSICIAN:  Lance Muss, M.D.                DATE OF BIRTH:  1940-07-21   DATE OF ADMISSION:  10/06/2003  DATE OF DISCHARGE:                                HISTORY & PHYSICAL   PROBLEM LIST:  #1 - CONFUSION.  This is a 70 year old patient who has a  history of chronic orthopedic pain and sees Dr. Elsie Saas.  She is on  OxyContin three times a day and Phenergan for nausea.  Over the last two to  three weeks she has had gradual loss of appetite, nausea, vomiting and  diarrhea.  She has had  5-10 loose stools a day, but no blood or mucus,  abdominal pain or fever.  There has been no signs of GI bleeding.  She was  hospitalized for one day in [place], [place] with a diagnosis of  dehydration .  She was rehydrated and sent home.  She had a second two or  three-day stay and ultimately last week she saw Dr. Ivery Quale on October 03, 2003.  A CT scan of the brain was done and said to be normal.  Now over  the last two days she has had increasing loss of appetite, dry heaves this  morning, and again she has had off and on confusion over the last two to  three weeks.  She has a history of migraines, but no new headaches, no focal  neurological symptoms.   PAST HISTORY:  Multiple orthopedic operations.   SOCIAL HISTORY:  Rare smoking.  No alcohol intake.   ALLERGIES:  MORPHINE (caused rash).   FAMILY HISTORY:  Noncontributory.   OBJECTIVE:  VITAL SIGNS:  Blood pressure 160/90, respirations normal,  temperature normal, pulse 90 and regular.  HEENT:  Head was normal.  Eyes showed the right pupil to be 1 mm larger than  the left pupil.  Both reacted to light and accommodated. Both disks were  slightly hazy nonspecifically, but there was no definite papilledema.  The  patient is status post cataract surgery with lens implant.  The ENT  examination is otherwise unremarkable.  NECK:  Normal, supple with full range of motion.  Carotids are 2+ without  bruits.  Thyroid is negative.  CHEST:  Clear to auscultation and percussion.  BREASTS:  Examination was negative.  CARDIOVASCULAR:  Rate and rhythm are normal, with a 2/6 systolic ejection  murmur.  ABDOMEN:  Soft, flat and nontender.  No organomegaly or abnormal masses.  Bowel sounds are normal. There are no abdominal bruits.  NEUROLOGIC:  The patient was awake, alert, oriented and slightly confused to  some of the details over the last week or so.  SKIN:  Normal.  MUSCULOSKELETAL:  Joint survey revealed post surgical changes in her  shoulders, and feet and ankle surgery.  There is 4+ hypertrophic changes of  the right knee.  There is no acute inflammatory changes.  RECTAL:  Normal.  Stools negative for occult blood.  PELVIC:  Exam is deferred.   LABORATORY DATA:  CBC 12.4, hematocrit 37, white count normal at 6100,  platelets are normal.  EKG showed a normal sinus rhythm.  There was  widespread Q-waves of questionable significance.  O2 saturation was 95%.  CMET was normal.   ASSESSMENT:  I spoke with Dr. Kirstie Peri. Hertweck to sign off this patient to  him, and he told me that he had done a B12, folate and TSH levels within the  last week; they were said to be normal.   1. CONFUSION.  Possibly secondary to medications.  2. DIARRHEA.  Etiology unknown.   PLAN:  Admit.  IV fluids. Stool evaluation.  No narcotics.  Halcyon 0.125 mg  would be okay.  Dr. Tomasa Hosteller to follow in the morning.                                                Lance Muss, M.D.    Romie Minus  D:  10/06/2003  T:  10/07/2003  Job:  [id number]

## 2010-07-10 NOTE — Op Note (Signed)
[facility]  Patient:    Shirley [name], Shirley [name]                        MRN: [id number] Proc. Date: 04/07/99 Adm. Date:  [id number] Attending:  Clydie Braun CC:         Josephine Cables., M.D.                           Operative Report  PREOPERATIVE DIAGNOSIS:  Right shoulder dislocation anterior/inferior.  POSTOPERATIVE DIAGNOSIS:  Right shoulder dislocation anterior/inferior.  PROCEDURE:  Right shoulder closed reduction of dislocation.  SURGEON:  Audree Camel. Noemi Chapel, M.D.  ANESTHESIA:  General.  OPERATIVE TIME:   5 minutes.  COMPLICATIONS:  None.  INDICATIONS:  [name] is a 70 year old woman, who sustained a fall and an anterior/inferior dislocation of her right shoulder within the past 24 hours. his dislocation was not reducible in the office and thus she is to undergo closed reduction with anesthesia.  DESCRIPTION OF PROCEDURE:  [name] was brought to the operating room on April 07, 1999 and placed on the operative table in supine position.  After an adequate level of general anesthesia was obtained, she underwent a closed reduction and maneuver of abduction, external rotation and traction and the shoulder popped back into place.  It was stable to examination after this was done.  Intraoperative x-rays confirmed anatomic reduction of the shoulder without fractures.  At this  point, she was awakened, sling and swathe was placed and she was taken to recovery room in stable condition.  FOLLOW-UP CARE:   [name] will be discharged after being cleared through the recovery room.  She will see Dr. French Ana for follow-up in one week. DD:  04/07/99 TD:  04/07/99 Job: [id number]

## 2010-08-23 HISTORY — PX: TRANSTHORACIC ECHOCARDIOGRAM: SHX275

## 2010-08-28 ENCOUNTER — Institutional Professional Consult (permissible substitution): Payer: Self-pay | Admitting: Family Medicine

## 2011-09-15 ENCOUNTER — Encounter: Payer: Self-pay | Admitting: Medical

## 2011-09-15 ENCOUNTER — Ambulatory Visit (INDEPENDENT_AMBULATORY_CARE_PROVIDER_SITE_OTHER): Payer: Medicare Other | Admitting: Medical

## 2011-09-15 VITALS — BP 138/80 | HR 60 | Temp 96.4°F | Resp 16 | Ht 65.0 in | Wt 189.0 lb

## 2011-09-15 DIAGNOSIS — L89509 Pressure ulcer of unspecified ankle, unspecified stage: Secondary | ICD-10-CM

## 2011-09-15 DIAGNOSIS — R062 Wheezing: Secondary | ICD-10-CM

## 2011-09-15 DIAGNOSIS — L8990 Pressure ulcer of unspecified site, unspecified stage: Secondary | ICD-10-CM

## 2011-09-15 DIAGNOSIS — J40 Bronchitis, not specified as acute or chronic: Secondary | ICD-10-CM

## 2011-09-15 MED ORDER — ALBUTEROL SULFATE HFA 108 (90 BASE) MCG/ACT IN AERS: 2.0000 | INHALATION_SPRAY | Freq: Four times a day (QID) | RESPIRATORY_TRACT | Status: DC | PRN

## 2011-09-15 MED ORDER — PREDNISONE 10 MG PO TABS: ORAL_TABLET | ORAL | Status: DC

## 2011-09-15 MED ORDER — AMOXICILLIN-POT CLAVULANATE 875-125 MG PO TABS: 1.0000 | ORAL_TABLET | Freq: Two times a day (BID) | ORAL | Status: AC

## 2011-09-15 NOTE — Progress Notes (Signed)
Subjective:  Shirley [name] is a 71 y.o. female who presents for "bronchitis."  Went to Urgent Care last week, prescribed Zpak for bronchitis, but this didn't help and it usually doesn't help.  Is now worse.  She reports cough, productive sputum, unable to sleep due cough.  Has sinus pressure, odor of sinuses.  Has some wheezing.  Denies sore throat, ear pain.  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  Use to see allergist Dr. Gerilyn Nestle.  No other aggravating or relieving factors.    She also c/o wound to right ankle x 2 years, won't heal.  Has tried various creams, saw ortho one time, but still not resolved.   No other c/o.  The following portions of the patient's history were reviewed and updated as appropriate: allergies, current medications, past family history, past medical history, past social history, past surgical history and problem list.   Objective:   Filed Vitals:   09/15/11 1610  BP: 138/80  Pulse: 60  Temp: 96.4 F (35.8 C)  Resp: 16    General appearance: Alert, WD/WN, no distress, obvious back asymmetry from fusion/and or scoliosis                             Skin: right ankle with small 1cm diameter area of erythema with slight depression/ulceration, no eschar, no drainage, no warmth of fluctuance                           Head: no sinus tenderness                            Eyes: conjunctiva normal, corneas clear, PERRLA                            Ears: pearly TMs, external ear canals normal                          Nose: septum midline, turbinates swollen, with erythema and clear discharge             Mouth/throat: MMM, tongue normal, mild pharyngeal erythema                           Neck: supple, no adenopathy, no thyromegaly, nontender                          Heart: RRR, normal S1, S2, no murmurs                         Lungs: +bronchial breath sounds, +scattered rhonchi, expiratory wheezes, no rales  Extremities: no edema, nontender      Assessment and Plan:   Encounter Diagnoses  Name Primary?  . Bronchitis Yes  . Wheezing   . Decubitus ulcer of ankle    Prescription given today for Augmentin, Prednisone, Albuterol inhaler as below.  Discussed diagnosis and treatment of bronchitis.  Suggested symptomatic OTC remedies for cough and congestion. Call/return in 2-3 days if symptoms are worse or not improving.  Recheck 1wk,  Decubitus ankle ulcer - small, but not resolving.  We will try [employer].  She will pickup from  [facility] and bring it back so we can apply it tomorrow or Friday x 5-7 days.

## 2011-09-15 NOTE — Patient Instructions (Signed)
Acute Bronchitis Bronchitis is a problem of the air tubes leading to your lungs. Acute means the illness started quickly. In this condition, the lining of those tubes becomes puffy (swollen) and can leak fluid. This makes it harder for air to get in and out of your lungs. You may cough a lot. This is because the air tubes are narrow. Bronchitis is most often caused by a virus. Medicines that kill germs (antibiotics) may be needed with germ (bacteria) infections for people who:  Smoke.   Have lasting (chronic) lung problems.   Are elderly.  HOME CARE  Rest.   Drink enough water and fluids to keep the pee clear or pale yellow.   Only take medicine as told by your doctor.   Medicines may be prescribed that will open up the airways. This will help make breathing easier.   Bronchitis usually gets better on its own in a few days.  Recovery from some problems (symptoms) of bronchitis may be slow. You should start feeling a little better after 2 to 3 days. Coughing may last for 3 to 4 weeks. GET HELP RIGHT AWAY IF:  You or your child has a temperature by mouth above 101, not controlled by medicine.   Chills or chest pain develops.   You or your child develops very bad shortness of breath.   There is bloody saliva mixed with mucus (sputum).   You or your child throws up (vomits) often, loses too much fluid (dehydration), feels faint, or has a very bad headache.   You or your child does not improve after 1 week of treatment.  MAKE SURE YOU:   Understand these instructions.   Will watch this condition.   Will get help right away if you or your child is not doing well or gets worse.  Document Released: 07/28/2007 Document Re-Released: 05/05/2009 [employer] Patient Information 2011 [employer].  

## 2011-09-16 ENCOUNTER — Ambulatory Visit: Payer: Self-pay | Admitting: Family Medicine

## 2011-09-22 ENCOUNTER — Ambulatory Visit (INDEPENDENT_AMBULATORY_CARE_PROVIDER_SITE_OTHER): Payer: Medicare Other | Admitting: Medical

## 2011-09-22 ENCOUNTER — Encounter: Payer: Self-pay | Admitting: Medical

## 2011-09-22 VITALS — BP 120/80 | HR 100 | Temp 98.0°F | Resp 16 | Wt 187.0 lb

## 2011-09-22 DIAGNOSIS — L8990 Pressure ulcer of unspecified site, unspecified stage: Secondary | ICD-10-CM

## 2011-09-22 DIAGNOSIS — J40 Bronchitis, not specified as acute or chronic: Secondary | ICD-10-CM

## 2011-09-22 DIAGNOSIS — L89509 Pressure ulcer of unspecified ankle, unspecified stage: Secondary | ICD-10-CM

## 2011-09-22 DIAGNOSIS — R062 Wheezing: Secondary | ICD-10-CM

## 2011-09-22 MED ORDER — PREDNISONE 20 MG PO TABS: ORAL_TABLET | ORAL | Status: DC

## 2011-09-22 NOTE — Progress Notes (Signed)
Subjective:  Shirley [name] is a 71 y.o. female who presents for recheck to have unna boot placed and recheck on "bronchitis."  I saw her last week, put her on Prednisone taper, Augmentin, and Albuterol for bronchitis.  She feels some better, but still getting productive sputum, wheezing.  She says in the past she always has to have 2 rounds of steroid to resolve these bronchitis flares.   She requests second round of steroid today.  No other aggravating or relieving factors.    She also c/o wound to right ankle x 2 years, won't heal.  Has tried various creams, saw ortho one time, but still not resolved.   No other c/o.  The following portions of the patient's history were reviewed and updated as appropriate: allergies, current medications, past family history, past medical history, past social history, past surgical history and problem list.   Objective:   Filed Vitals:   09/22/11 1000  BP: 120/80  Pulse: 100  Temp: 98 F (36.7 C)  Resp: 16    General appearance: Alert, WD/WN, no distress, obvious back asymmetry from fusion/and or scoliosis                             Skin: right ankle with small 1cm diameter area of erythema with slight depression/ulceration, no eschar, no drainage, no warmth of fluctuance                           Head: no sinus tenderness                            Eyes: conjunctiva normal, corneas clear, PERRLA                            Ears: pearly TMs, external ear canals normal                          Nose: septum midline, turbinates swollen, with erythema and clear discharge             Mouth/throat: MMM, tongue normal, mild pharyngeal erythema                           Neck: supple, no adenopathy, no thyromegaly, nontender                          Heart: RRR, normal S1, S2, no murmurs                         Lungs: +bronchial breath sounds, +scattered rhonchi, expiratory wheezes, no rales                Extremities: no edema, nontender  CXR: tortuous aorta,  screws and hardware present from prior scoliosis surgery in the thoracic spine, possible opacity over left hilar area/upper lobe.  Will send CXR for overread.       Assessment and Plan:   Encounter Diagnoses  Name Primary?  . Bronchitis Yes  . Wheezing   . Decubitus ulcer of ankle    Prescription given today for additional prednisone.  i recommended trial of Symbicort but she declines.  Finish Augmentin, use Albuterol inhaler q4 hours, and if not  improving in 3-4 days, then return.   We will send CXR for overread.    Decubitus ankle ulcer - she brought in her [employer], and I wrapped the right ankle and lower leg with unna boot in office.  recheck in 5 days.

## 2011-09-23 ENCOUNTER — Encounter: Payer: Self-pay | Admitting: Family Medicine

## 2011-09-27 ENCOUNTER — Ambulatory Visit (INDEPENDENT_AMBULATORY_CARE_PROVIDER_SITE_OTHER): Payer: Medicare Other | Admitting: Medical

## 2011-09-27 ENCOUNTER — Encounter: Payer: Self-pay | Admitting: Medical

## 2011-09-27 VITALS — BP 130/80 | HR 80 | Temp 98.1°F | Resp 14 | Wt 177.0 lb

## 2011-09-27 DIAGNOSIS — B370 Candidal stomatitis: Secondary | ICD-10-CM

## 2011-09-27 DIAGNOSIS — L8990 Pressure ulcer of unspecified site, unspecified stage: Secondary | ICD-10-CM

## 2011-09-27 DIAGNOSIS — J45909 Unspecified asthma, uncomplicated: Secondary | ICD-10-CM

## 2011-09-27 DIAGNOSIS — L89509 Pressure ulcer of unspecified ankle, unspecified stage: Secondary | ICD-10-CM

## 2011-09-27 MED ORDER — FLUCONAZOLE 100 MG PO TABS: 100.0000 mg | ORAL_TABLET | Freq: Every day | ORAL | Status: AC
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
## 2011-09-29 ENCOUNTER — Other Ambulatory Visit: Payer: Self-pay | Admitting: Medical

## 2011-09-29 MED ORDER — DUODERM CGF SPOTS EXTRA THIN EX MISC: 4.0000 | CUTANEOUS | Status: DC

## 2011-10-01 NOTE — Progress Notes (Signed)
I called out the duoderm to [facility] and I also called and I let the patient know. CLS I also told her to follow up with [place] in 1 week at no charge per [place]. CLS

## 2011-10-20 ENCOUNTER — Encounter: Payer: Self-pay | Admitting: Family Medicine

## 2011-11-16 ENCOUNTER — Encounter (HOSPITAL_BASED_OUTPATIENT_CLINIC_OR_DEPARTMENT_OTHER): Payer: Medicare Other | Attending: General Surgery

## 2011-11-16 DIAGNOSIS — I10 Essential (primary) hypertension: Secondary | ICD-10-CM | POA: Insufficient documentation

## 2011-11-16 DIAGNOSIS — G589 Mononeuropathy, unspecified: Secondary | ICD-10-CM | POA: Insufficient documentation

## 2011-11-16 DIAGNOSIS — I872 Venous insufficiency (chronic) (peripheral): Secondary | ICD-10-CM | POA: Insufficient documentation

## 2011-11-16 DIAGNOSIS — J449 Chronic obstructive pulmonary disease, unspecified: Secondary | ICD-10-CM | POA: Insufficient documentation

## 2011-11-16 DIAGNOSIS — F329 Major depressive disorder, single episode, unspecified: Secondary | ICD-10-CM | POA: Insufficient documentation

## 2011-11-16 DIAGNOSIS — E785 Hyperlipidemia, unspecified: Secondary | ICD-10-CM | POA: Insufficient documentation

## 2011-11-16 DIAGNOSIS — Z79899 Other long term (current) drug therapy: Secondary | ICD-10-CM | POA: Insufficient documentation

## 2011-11-16 DIAGNOSIS — L97309 Non-pressure chronic ulcer of unspecified ankle with unspecified severity: Secondary | ICD-10-CM | POA: Insufficient documentation

## 2011-11-16 DIAGNOSIS — F3289 Other specified depressive episodes: Secondary | ICD-10-CM | POA: Insufficient documentation

## 2011-11-16 DIAGNOSIS — J4489 Other specified chronic obstructive pulmonary disease: Secondary | ICD-10-CM | POA: Insufficient documentation

## 2011-11-22 NOTE — Progress Notes (Signed)
[facility]  NAME:  RAYCHELLE, WHITEHILL NO.:  [id number]  MEDICAL RECORD NO.:  [id number]      DATE OF BIRTH:  1941/01/05  PHYSICIAN:  Judene Companion, M.D.           VISIT DATE:                                  OFFICE VISIT   This is a very pleasant 71 year old lady who has many medical problems including hypertension, hyperlipidemia, COPD, neuropathy, depression. 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 She has had many other surgeries including a hysterectomy and appendectomy, bilateral rotator cuff surgery, right venous vein stripping and cataracts.  She comes to [place] with a small ulcer on the lateral malleolus of the right foot.  Her blood pressure is 125/82, respirations 18, pulse 59, temperature 98.3.  She is 5 feet 8 inches and weighs 185 pounds.  She also has a history of depression and is on Zoloft.  The wound itself is only 2 or 3 mm in diameter, and appears to have some dermis that is intact.  It is reportedly venous stasis ulcer. She apparently has been on some Lasix in the past, and she is going to see her doctor in the next week or so, and I told her to ask him about taking some Lasix as she does have 1-2 mm finger depression pitting of her anterior leg.  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     Judene Companion, M.D.     PP/MEDQ  D:  11/22/2011  T:  11/22/2011  Job:  [id number]

## 2011-11-29 ENCOUNTER — Encounter (HOSPITAL_BASED_OUTPATIENT_CLINIC_OR_DEPARTMENT_OTHER): Payer: Medicare Other | Attending: General Surgery

## 2011-11-29 DIAGNOSIS — I8390 Asymptomatic varicose veins of unspecified lower extremity: Secondary | ICD-10-CM | POA: Insufficient documentation

## 2011-11-29 DIAGNOSIS — I872 Venous insufficiency (chronic) (peripheral): Secondary | ICD-10-CM | POA: Insufficient documentation

## 2011-11-29 DIAGNOSIS — L97309 Non-pressure chronic ulcer of unspecified ankle with unspecified severity: Secondary | ICD-10-CM | POA: Insufficient documentation

## 2011-11-29 DIAGNOSIS — L97809 Non-pressure chronic ulcer of other part of unspecified lower leg with unspecified severity: Secondary | ICD-10-CM | POA: Insufficient documentation

## 2011-12-14 ENCOUNTER — Encounter (HOSPITAL_BASED_OUTPATIENT_CLINIC_OR_DEPARTMENT_OTHER): Payer: Medicare Other

## 2012-02-15 ENCOUNTER — Encounter: Payer: Self-pay | Admitting: Family Medicine

## 2012-02-15 ENCOUNTER — Ambulatory Visit (INDEPENDENT_AMBULATORY_CARE_PROVIDER_SITE_OTHER): Payer: Medicare Other | Admitting: Family Medicine

## 2012-02-15 VITALS — BP 132/80 | HR 64 | Temp 98.0°F | Ht 65.0 in | Wt 182.0 lb

## 2012-02-15 DIAGNOSIS — J40 Bronchitis, not specified as acute or chronic: Secondary | ICD-10-CM

## 2012-02-15 DIAGNOSIS — Z23 Encounter for immunization: Secondary | ICD-10-CM

## 2012-02-15 DIAGNOSIS — R0602 Shortness of breath: Secondary | ICD-10-CM

## 2012-02-15 MED ORDER — PREDNISONE 20 MG PO TABS: 20.0000 mg | ORAL_TABLET | Freq: Two times a day (BID) | ORAL | Status: DC

## 2012-02-15 MED ORDER — DOXYCYCLINE HYCLATE 100 MG PO TABS: 100.0000 mg | ORAL_TABLET | Freq: Two times a day (BID) | ORAL | Status: DC

## 2012-02-15 MED ORDER — INFLUENZA VIRUS VACC SPLIT PF IM SUSP: 0.5000 mL | Freq: Once | INTRAMUSCULAR | Status: DC

## 2012-02-15 NOTE — Progress Notes (Signed)
Chief Complaint  Patient presents with  . Cough    started Saturday, cough is now productive and producing greenish mucus. Also feels/seems a litltle off balance(noticed when she was walking), states she "feels weird." Wants to know if she can have a flu shot today.   HPI: 5 nights ago she could "feel something coming on", scratchy throat, didn't sleep well, and felt much worse 2 days ago--tired.  Denies runny nose.  She has a tickle in her throat, which triggers a cough, and eventually able to cough up phlegm which is green in color.  Using mucinex for 3 days.  Denies fever.  Having some wheezing, started using albuterol 3 days ago (ran out, using her family members), twice daily, not sure if it helped.  Sometimes has chest tightness (not currently).  Today feels like she "isn't with it"--denies confusion, but just hard to concentrate. Denies vertigo, dizziness, ear pain, tinnitus.  Hasn't used inhaler yet today.  She started taking doxycycline leftover from previous infection x 3 days. Not sure if it has helped much.  Denies sick contacts, although recalls holding on to a handrail in a nursing home, and it seemed dirty.  Past Medical History  Diagnosis Date  . Edema   . Vision problem     wears glasses  . Chronic pain   . Depression   . BV (bacterial vaginosis)     history of frequent  . Allergy     vasomotor rhinitis  . Asthma     h/o asthma and bronchitis  . GERD (gastroesophageal reflux disease)     h/o  . Varicose veins   . Osteopenia     mild at hip (T-1.3)  . Cervical spondylosis     herniated disk protruding @ C6-7, impinging cord and left axillary root sleeve.  . Vitamin D deficiency   . Fibromyalgia     sees Dr.Phillips-pain management  . DVT of upper extremity (deep vein thrombosis)     h/o left(after wrist fracture/surgery)   Past Surgical History  Procedure Date  . Appendectomy   . Tonsillectomy and adenoidectomy   . Back surgery age 1    ruptured disk L3-4    . Neck surgery 2007  . Knee surgery     right knee replacement  . Shoulder surgery     x8 (4 on rt side and 4 on left side)  . Wrist surgery     2 surgeries left wrist after fracture(complicated by DVT)  . Foot surgery     multiple(at least 4 on right, 5 on left)-left Dr.Kerner([place])11/08,left Dr.Nunley-excision 2nd and 3rd mt heads w/artho and hammertoe surgery 4th and 5th 04/2006. left great toe IP fusion and revision of fusion of 2nd through 5th toes 12/2005. Right foot surgeries  Dr.Bednarz 04/2008 and 04/2009.  . Abdominal hysterectomy 1999    BSO for endometriosis  . Cataract extraction 2000    B/L  . Refractive surgery 2007    left eye  . Finger surgery     left finger for tender rupture from fall.  Marland Kitchen Spinal fusion 6/09    Dr.Cohen   History   Social History  . Marital Status: Married    Spouse Name: N/A    Number of Children: N/A  . Years of Education: N/A   Occupational History  . Not on file.   Social History Main Topics  . Smoking status: Former Smoker    Quit date: 02/14/1957  . Smokeless tobacco: Never Used  Comment: + h/o passive exposure (second hand) from family, husband (none currently)  . Alcohol Use: No  . Drug Use: No  . Sexually Active: Not on file   Other Topics Concern  . Not on file   Social History Narrative   Lives with husband.  3 children ([place], [place], [place]), 5 grandchildren   Current outpatient prescriptions:ALPRAZolam (XANAX) 1 MG tablet, Take 1 mg by mouth at bedtime as needed., Disp: , Rfl: ;  Fexofenadine HCl (ALLEGRA PO), Take by mouth., Disp: , Rfl: ;  gabapentin (NEURONTIN) 300 MG capsule, Take 300 mg by mouth 4 (four) times daily. , Disp: , Rfl: ;  oxycodone (ROXICODONE) 30 MG immediate release tablet, Take 30 mg by mouth every 4 (four) hours as needed., Disp: , Rfl:  albuterol (PROVENTIL HFA;VENTOLIN HFA) 108 (90 BASE) MCG/ACT inhaler, Inhale 2 puffs into the lungs every 6 (six) hours as needed for wheezing., Disp: 1  Inhaler, Rfl: 0 Taking leftover doxycycline x 3 days.  No Known Allergies  ROS:  Denies fevers, sinus pain, ear pain, sore throat.  Denies chest pain, palpitations, nausea, vomiting, diarrhea, abdominal pain, skin rash, depression.  Denies dizziness/vertigo.  Chronic pain, and reports she always is "leaning" due to her back/neck issues.  PHYSICAL EXAM: BP 132/80  Pulse 64  Temp 98 F (36.7 C) (Oral)  Ht 5\' 5"  (1.651 m)  Wt 182 lb (82.555 kg)  BMI 30.29 kg/m2  Pleasant elderly female, with occasional dry cough.  Speaking easily, in no distress HEENT:  PERRL, EOMI, conjunctiva clear.  TM's and EAC's normal.  Nasal mucosa mildly edematous, pale.  Sinuses nontender.  OP clear Neck: no lymphadenopathy Heart: irregularly irregular Lungs: diffuse ronchi and wheezes throughout, with fair air movement prior to neb.  After neb--felt much better.  No longer felt "off" or trouble concentrating, breathing improved. Exam unchanged--ongoing ronchi and wheezes.  EKG--sinus rhythm rate of 82; marked sinus arrhythmia.  No acute abnormalities EKG performed due to irregular nature of rhythm on exam to r/o atrial fibrillation.  ASSESSMENT/PLAN: 1. SOB (shortness of breath)    2. Bronchitis  predniSONE (DELTASONE) 20 MG tablet, doxycycline (VIBRA-TABS) 100 MG tablet  3. Need for prophylactic vaccination and inoculation against influenza  influenza  inactive virus vaccine (FLUZONE/FLUARIX) injection 0.5 mL    Bronchitis: prednisone 20mg  BID x 5 days.  Likely viral At this point obligated to complete course of ABX  Flu shot today--advised to get earlier in season in future. F/u next week if not improving. Risks/side effects of prednisone reviewed.

## 2012-02-15 NOTE — Patient Instructions (Signed)
Take prednisone twice daily for 5 days. Take antibiotics for a week.  You must complete all antibiotics, and never start them for a future illness without being evaliuated by MD first (unless it is an emergency)  Return next week if your breathing isn't improved.

## 2012-02-17 NOTE — Addendum Note (Signed)
Addended by: Randel Books on: 02/17/2012 09:36 AM   Modules accepted: Orders

## 2012-02-22 ENCOUNTER — Encounter (HOSPITAL_BASED_OUTPATIENT_CLINIC_OR_DEPARTMENT_OTHER): Payer: Medicare Other

## 2012-03-21 ENCOUNTER — Encounter (HOSPITAL_BASED_OUTPATIENT_CLINIC_OR_DEPARTMENT_OTHER): Payer: Medicare Other

## 2012-05-18 ENCOUNTER — Other Ambulatory Visit: Payer: Medicare Other

## 2012-05-18 NOTE — Progress Notes (Unsigned)
Patient came in, was sent over by her pain management doctor. Her bp at their office was 186/112. Patient's bp reading here was 152/96. She did tell me that her husband just had a stroke, that she was rushing to get to her appointment and just felt a little stressed today. She told me that she took a "nerve pill" after they took her bp at her pain management doctors office before she came over to [place]. She is asymptomatic at the present time. Denies any headache, chest pain, arm pain or SOB. Spoke with Dr.Knapp and she would like patient to monitor her bp for the next few days. If the blood pressures remain elevated she will call for appointment. Patient verbalized understanding and was allowed to check out.

## 2012-07-19 ENCOUNTER — Ambulatory Visit (INDEPENDENT_AMBULATORY_CARE_PROVIDER_SITE_OTHER): Payer: Medicare Other | Admitting: Family Medicine

## 2012-07-19 ENCOUNTER — Encounter: Payer: Self-pay | Admitting: Family Medicine

## 2012-07-19 VITALS — BP 120/70 | HR 82 | Temp 100.1°F | Wt 175.0 lb

## 2012-07-19 DIAGNOSIS — I499 Cardiac arrhythmia, unspecified: Secondary | ICD-10-CM

## 2012-07-19 DIAGNOSIS — J209 Acute bronchitis, unspecified: Secondary | ICD-10-CM

## 2012-07-19 DIAGNOSIS — H6690 Otitis media, unspecified, unspecified ear: Secondary | ICD-10-CM

## 2012-07-19 DIAGNOSIS — H6691 Otitis media, unspecified, right ear: Secondary | ICD-10-CM

## 2012-07-19 MED ORDER — AMOXICILLIN-POT CLAVULANATE 875-125 MG PO TABS: 1.0000 | ORAL_TABLET | Freq: Two times a day (BID) | ORAL | Status: DC

## 2012-07-19 NOTE — Progress Notes (Signed)
  Subjective:    Patient ID: Shirley [name], female    DOB: 05/12/1940, 72 y.o.   MRN: [id number]  HPI She has a one week history of sore throat and cough with some chills. She also is having some nasal congestion and runny nose and slightly hoarse voice. She is slowly getting worse.   Review of Systems     Objective:   Physical Exam alert and in no distress. Tympanic membrane on the right is slightly vascular, left is normal,canals are normal. Throat is clear. Tonsils are normal. Neck is supple without adenopathy or thyromegaly. Cardiac exam shows an irregular  rhythm without murmurs or gallops. Lungs show scattered rhonchi. EKG shows baseline interference making reading is very difficult but the arrhythmia appears to be supraventricular. Poor R wave progression also noted. P waves also appear abnormal       Assessment & Plan:  Right otitis media - Plan: amoxicillin-clavulanate (AUGMENTIN) 875-125 MG per tablet  Acute bronchitis - Plan: amoxicillin-clavulanate (AUGMENTIN) 875-125 MG per tablet  Arrhythmia - Plan: EKG 12-Lead, CBC with Differential, Comprehensive metabolic panel, Lipid panel, TSH since the EKG is difficult to interpret, I will have her return here in 10 days for reevaluation of her bronchitis and otitis as well as hopefully getting a better EKG. Cardiology evaluation may possibly be necessary.

## 2012-07-20 LAB — COMPREHENSIVE METABOLIC PANEL
AST: 14 U/L (ref 0–37)
Albumin: 3.9 g/dL (ref 3.5–5.2)
Alkaline Phosphatase: 59 U/L (ref 39–117)
Glucose, Bld: 104 mg/dL — ABNORMAL HIGH (ref 70–99)
Potassium: 4.1 mEq/L (ref 3.5–5.3)
Sodium: 137 mEq/L (ref 135–145)
Total Bilirubin: 0.5 mg/dL (ref 0.3–1.2)
Total Protein: 6.3 g/dL (ref 6.0–8.3)

## 2012-07-20 LAB — CBC WITH DIFFERENTIAL/PLATELET
Basophils Absolute: 0 10*3/uL (ref 0.0–0.1)
Basophils Relative: 0 % (ref 0–1)
Eosinophils Absolute: 0.1 10*3/uL (ref 0.0–0.7)
MCH: 28.1 pg (ref 26.0–34.0)
MCHC: 32.5 g/dL (ref 30.0–36.0)
Monocytes Relative: 14 % — ABNORMAL HIGH (ref 3–12)
Neutro Abs: 5 10*3/uL (ref 1.7–7.7)
Neutrophils Relative %: 68 % (ref 43–77)
Platelets: 452 10*3/uL — ABNORMAL HIGH (ref 150–400)
RDW: 14.2 % (ref 11.5–15.5)

## 2012-07-20 LAB — LIPID PANEL
LDL Cholesterol: 75 mg/dL (ref 0–99)
Triglycerides: 86 mg/dL (ref <150)
VLDL: 17 mg/dL (ref 0–40)

## 2012-07-20 LAB — TSH: TSH: 1.507 u[IU]/mL (ref 0.350–4.500)

## 2012-07-20 NOTE — Progress Notes (Signed)
Quick Note:  CALLED PT HOME # TO INFORM HER THAT HER LABS LOOK OKAY PT VERBALIZED UNDERSTANDING ______

## 2012-07-31 ENCOUNTER — Encounter: Payer: Self-pay | Admitting: Family Medicine

## 2012-07-31 ENCOUNTER — Ambulatory Visit (INDEPENDENT_AMBULATORY_CARE_PROVIDER_SITE_OTHER): Payer: Medicare Other | Admitting: Family Medicine

## 2012-07-31 VITALS — BP 140/86 | HR 74 | Temp 97.9°F | Ht 65.0 in | Wt 180.0 lb

## 2012-07-31 DIAGNOSIS — I809 Phlebitis and thrombophlebitis of unspecified site: Secondary | ICD-10-CM

## 2012-07-31 DIAGNOSIS — I499 Cardiac arrhythmia, unspecified: Secondary | ICD-10-CM

## 2012-07-31 DIAGNOSIS — J45909 Unspecified asthma, uncomplicated: Secondary | ICD-10-CM

## 2012-07-31 DIAGNOSIS — R9431 Abnormal electrocardiogram [ECG] [EKG]: Secondary | ICD-10-CM

## 2012-07-31 DIAGNOSIS — R1900 Intra-abdominal and pelvic swelling, mass and lump, unspecified site: Secondary | ICD-10-CM

## 2012-07-31 MED ORDER — ALBUTEROL SULFATE HFA 108 (90 BASE) MCG/ACT IN AERS: 2.0000 | INHALATION_SPRAY | Freq: Four times a day (QID) | RESPIRATORY_TRACT | Status: DC | PRN

## 2012-07-31 NOTE — Patient Instructions (Signed)
We are referring you to cardiologist for irregular heart rhythm. We are sending you for ultrasound of your stomach to evaluate the "knot". Use moist compresses and ibuprofen for the knot on your right leg  Phlebitis Phlebitis is a redness, tenderness and soreness (inflammation) in a vein. This can occur in your arms, legs, or torso (trunk), as well as deeper inside your body.  CAUSES  Phlebitis can be triggered by multiple factors. These include:  Reduced (restricted) blood flow through your veins. This happens with prolonged bed rest, long distance travel, injury or surgery. Being overweight (obese) and pregnant can also restrict blood flow and lead to phlebitis.  Putting a catheter in the vein (intravenous or IV) and giving certain medications through in the vein (intravenously).  Cancer and cancer treatment.  Use of illegal intravenous drugs.  Inflammatory diseases.  Inherited (genetic) diseases that increase the risk for blood clots.  Hormone therapy (such as birth control pills). SYMPTOMS   Red, tender, swollen, painful area on your skin.  Usually, the area will be long and narrow.  Low grade fever.  Significant firmness along the center of this area. This can indicate that a blood clot has formed.  Surrounding redness or a high fever, which can indicate an infection (cellulitis). DIAGNOSIS   The appearance of your condition and your symptoms will cause your caregiver to suspect phlebitis. Usually, this is enough for a diagnosis.  Your caregiver may request blood tests or an ultrasound test of the area to be sure you do not have an infection or a blood clot. Blood tests and discussing your family history may also indicate if you have an underlying genetic disease that causes blood clots.  Occasionally, a piece of tissue is taken from the body (biopsy) if an unusual cause of phlebitis is suspected. TREATMENT   Raise (elevate) the affected area above the level of the  heart.  Apply a warm compress or heating pad for 20 minutes, 3 or 4 times a day. If you use an electric heating pad, follow the directions so you do not burn yourself.  Anti-inflammatory medications are usually recommended. Follow your caregiver's directions.  Any IV catheter, if present, will be removed by your caregiver.  Your caregiver may prescribe medicines that kill germs (antibiotics) if an infection is present.  Your caregiver may recommend blood thinners if a blood clot is suspected or present.  Support stockings or bandages may be helpful, depending on the cause and location of the phlebitis.  Surgery may be needed to remove very damaged sections of vein, but this is rare. HOME CARE INSTRUCTIONS   Take medications exactly as prescribed.  Follow up with your caregiver as directed.  Use support stockings or bandages if advised. These will speed healing and prevent recurrence.  If you are on blood thinners:  Do follow-up blood tests exactly as directed.  Check with your caregiver before using any new medications.  Wear a pendant to show that you are on blood thinners.  For phlebitis in the legs:  Avoid prolonged standing or bed rest.  Keep your legs moving. Raise your legs with sitting or lying.  Do not smoke.  Women, particularly those over the age of 75, should consider the risks and benefits of taking the contraceptive pill. This kind of hormone treatment can increase your risk for blood clots. SEEK MEDICAL CARE IF:   You have unusual bruising or any bleeding problems.  Swelling or pain in your affected arm or leg is  not gradually improving.  You are on anti-inflammatory medication and you develop belly (abdominal) pain. SEEK IMMEDIATE MEDICAL CARE IF:   An unexplained oral temperature above 100.5 F (38.1 C) develops.  You have sudden onset of chest pain or difficulty breathing. Document Released: 02/02/2001 Document Revised: 05/03/2011 Document  Reviewed: 11/04/2008 [facility] Patient Information 2014 [place].
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
## 2012-08-03 ENCOUNTER — Ambulatory Visit
Admission: RE | Admit: 2012-08-03 | Discharge: 2012-08-03 | Disposition: A | Discharge status: Home / Self Care | Payer: Medicare Other | Source: Ambulatory Visit | Attending: Family Medicine | Admitting: Family Medicine

## 2012-08-03 DIAGNOSIS — R1900 Intra-abdominal and pelvic swelling, mass and lump, unspecified site: Secondary | ICD-10-CM

## 2012-08-08 ENCOUNTER — Encounter: Payer: Self-pay | Admitting: Internal Medicine

## 2012-08-09 ENCOUNTER — Ambulatory Visit (INDEPENDENT_AMBULATORY_CARE_PROVIDER_SITE_OTHER): Payer: Medicare Other | Admitting: Cardiology

## 2012-08-09 ENCOUNTER — Encounter: Payer: Self-pay | Admitting: Cardiology

## 2012-08-09 VITALS — BP 138/70 | HR 88 | Ht 65.0 in | Wt 177.6 lb

## 2012-08-09 DIAGNOSIS — I471 Supraventricular tachycardia, unspecified: Secondary | ICD-10-CM

## 2012-08-09 DIAGNOSIS — I359 Nonrheumatic aortic valve disorder, unspecified: Secondary | ICD-10-CM

## 2012-08-09 DIAGNOSIS — R9431 Abnormal electrocardiogram [ECG] [EKG]: Secondary | ICD-10-CM

## 2012-08-09 DIAGNOSIS — I358 Other nonrheumatic aortic valve disorders: Secondary | ICD-10-CM

## 2012-08-09 DIAGNOSIS — I491 Atrial premature depolarization: Secondary | ICD-10-CM

## 2012-08-09 NOTE — Patient Instructions (Addendum)
Your ECG is indeed abnormal, but not overly concerning.  The underlying rhythm looks to be normal, but you have frequent early ("premature") beats.    This type of finding is often related to long standing lung disease.  You also have a soft Murmur -- irregular sound caused by turbulent flow through one of your heart valves.    I would like to evaluate the Murmur and the abnormal ECG with a test called an Echocardiogram.  I will see you back in a few weeks to discuss the results of your test.  Leonie Man, MD   Your physician has requested that you have an echocardiogram. Echocardiography is a painless test that uses sound waves to create images of your heart. It provides your doctor with information about the size and shape of your heart and how well your heart's chambers and valves are working. This procedure takes approximately one hour. There are no restrictions for this procedure.

## 2012-08-17 ENCOUNTER — Ambulatory Visit (HOSPITAL_COMMUNITY): Payer: Medicare Other

## 2012-08-19 ENCOUNTER — Encounter: Payer: Self-pay | Admitting: Cardiology

## 2012-08-19 DIAGNOSIS — R9431 Abnormal electrocardiogram [ECG] [EKG]: Secondary | ICD-10-CM | POA: Insufficient documentation

## 2012-08-19 DIAGNOSIS — I358 Other nonrheumatic aortic valve disorders: Secondary | ICD-10-CM | POA: Insufficient documentation

## 2012-08-19 DIAGNOSIS — I471 Supraventricular tachycardia: Secondary | ICD-10-CM | POA: Insufficient documentation

## 2012-08-19 NOTE — Assessment & Plan Note (Signed)
These episodes are very brief on her ECG. If she starts noticing having dizziness or sensation of rapid heart beats we would then want to consider checking a event monitor just to determine how fast she goes. That would potentially make [place] want to treat her rate a little more aggressively.

## 2012-08-19 NOTE — Assessment & Plan Note (Signed)
If these do become symptomatic, the best option would probably be a beta-1 selective beta blocker such as metoprolol, however without her having significant symptoms, I don't really know that we did do much aggressive treatment at this time.

## 2012-08-19 NOTE — Assessment & Plan Note (Addendum)
These findings are often consistent with active respiratory/lung disease. Would lack of symptoms and lack of prolonged tachycardia, I'm reluctant to actually treat her. I would like to get an echocardiogram just confirmed as no structural abnormality of the heart that could be contributing.
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
## 2012-08-19 NOTE — Assessment & Plan Note (Signed)
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

## 2012-08-21 ENCOUNTER — Ambulatory Visit: Payer: Medicare Other | Admitting: Family Medicine

## 2012-08-22 ENCOUNTER — Encounter: Payer: Self-pay | Admitting: Cardiology

## 2012-08-22 ENCOUNTER — Ambulatory Visit (INDEPENDENT_AMBULATORY_CARE_PROVIDER_SITE_OTHER): Payer: Medicare Other | Admitting: Cardiology

## 2012-08-22 VITALS — BP 120/60 | HR 56 | Ht 63.0 in | Wt 174.5 lb

## 2012-08-22 DIAGNOSIS — I83893 Varicose veins of bilateral lower extremities with other complications: Secondary | ICD-10-CM

## 2012-08-22 DIAGNOSIS — R600 Localized edema: Secondary | ICD-10-CM

## 2012-08-22 DIAGNOSIS — I358 Other nonrheumatic aortic valve disorders: Secondary | ICD-10-CM

## 2012-08-22 DIAGNOSIS — R609 Edema, unspecified: Secondary | ICD-10-CM

## 2012-08-22 DIAGNOSIS — I359 Nonrheumatic aortic valve disorder, unspecified: Secondary | ICD-10-CM

## 2012-08-22 DIAGNOSIS — I83813 Varicose veins of bilateral lower extremities with pain: Secondary | ICD-10-CM

## 2012-08-22 HISTORY — DX: Varicose veins of bilateral lower extremities with other complications: I83.893

## 2012-08-22 NOTE — Patient Instructions (Addendum)
We are still waiting for your Echocardiogram.    While we are waiting, we will order Vein Dopplers on your legs to look for blood clots & also for Vein valve insufficiency related to your varicose veins. This is to evaluate your leg pain.

## 2012-08-27 ENCOUNTER — Encounter: Payer: Self-pay | Admitting: Cardiology

## 2012-08-27 DIAGNOSIS — R600 Localized edema: Secondary | ICD-10-CM | POA: Insufficient documentation

## 2012-08-27 DIAGNOSIS — I83813 Varicose veins of bilateral lower extremities with pain: Secondary | ICD-10-CM | POA: Insufficient documentation

## 2012-08-27 NOTE — Progress Notes (Signed)
Patient ID: Shirley [name], female   DOB: 20-Sep-1940, 72 y.o.   MRN: [id number]   Patient ID: Shirley [name], female   DOB: 01/12/1941, 72 y.o.   MRN: [id number]  Clinic Note: HPI: Shirley [name] is a 72 y.o. female with a PMH below who is followed back on June 18 for abnormal ECG and cardiac arrhythmia. She was 07 echocardiogram performed which has not yet been done. However she came in today with a complaint of right leg and groin pain that began last night. She notes swelling that comes from the groin all with the knee is worse in the right side than the left it is bilateral she cannot recall any recent injury or recent travel. Is no bruising. She did notice it action of working on her feet all day long. She is felt some ropelike veins in her legs.she has had swelling in her legs in the past again today which is markedly prolonged time but not to this extent. She'll most of which is pulled muscle or some point that is worried about a clot.   She really hasn't had any more palpitations  since last visit. Marland Kitchen No chest pain or shortness of breathWith rest or exertion . She got over bronchitis episode. She has no syncope or near  syncope symptoms.No PND or orthopnea however.  Past Medical History  Diagnosis Date  . Edema   . Vision problem     wears glasses  . Chronic pain   . Depression   . BV (bacterial vaginosis)     history of frequent  . Allergy     vasomotor rhinitis  . Asthma     h/o asthma and bronchitis  . GERD (gastroesophageal reflux disease)     h/o  . Varicose veins   . Osteopenia     mild at hip (T-1.3)  . Cervical spondylosis     herniated disk protruding @ C6-7, impinging cord and left axillary root sleeve.  . Vitamin D deficiency   . Fibromyalgia     sees Dr.Phillips-pain management  . DVT of upper extremity (deep vein thrombosis)     h/o left(after wrist fracture/surgery)    Prior Cardiac Evaluation and Past Surgical History: ~20+ orthopedic surgeries Past Surgical History   Procedure Laterality Date  . Appendectomy    . Tonsillectomy and adenoidectomy    . Back surgery  age 44    ruptured disk L3-4   . Neck surgery  2007  . Knee surgery      right knee replacement  . Shoulder surgery      x8 (4 on rt side and 4 on left side)  . Wrist surgery      2 surgeries left wrist after fracture(complicated by DVT)  . Foot surgery      multiple(at least 4 on right, 5 on left)-left Dr.Kerner([place])11/08,left Dr.Nunley-excision 2nd and 3rd mt heads w/artho and hammertoe surgery 4th and 5th 04/2006. left great toe IP fusion and revision of fusion of 2nd through 5th toes 12/2005. Right foot surgeries  Dr.Bednarz 04/2008 and 04/2009.  . Abdominal hysterectomy  1999    BSO for endometriosis  . Cataract extraction  2000    B/L  . Refractive surgery  2007    left eye  . Finger surgery      left finger for tender rupture from fall.  Marland Kitchen Spinal fusion  6/09    Dr.Cohen   No Known Allergies  Current Outpatient Prescriptions  Medication Sig Dispense  Refill  . albuterol (PROVENTIL HFA;VENTOLIN HFA) 108 (90 BASE) MCG/ACT inhaler Inhale 2 puffs into the lungs every 6 (six) hours as needed for wheezing.  1 Inhaler  1  . ALPRAZolam (XANAX) 1 MG tablet Take 1 mg by mouth at bedtime as needed.      . Cholecalciferol (VITAMIN D3) 5000 UNITS CAPS Take by mouth.      . Fexofenadine HCl (ALLEGRA PO) Take by mouth.      . gabapentin (NEURONTIN) 300 MG capsule Take 300 mg by mouth 4 (four) times daily.       Marland Kitchen oxycodone (ROXICODONE) 30 MG immediate release tablet Take 30 mg by mouth every 4 (four) hours as needed.      . TURMERIC PO Take by mouth.       No current facility-administered medications for this visit.    History   Social History  . Marital Status: Married    Spouse Name: Shirley [name]    Number of Children: N/A  . Years of Education: N/A   Occupational History  . Not on file.   Social History Main Topics  . Smoking status: Former Smoker    Quit date: 02/14/1957  .  Smokeless tobacco: Never Used     Comment: + h/o passive exposure (second hand) from family, husband (none currently)  . Alcohol Use: No  . Drug Use: No  . Sexually Active: Not on file   Other Topics Concern  . Not on file   Social History Narrative   Lives with husband.  3 children ([place], [place], [place]), 5 grandchildren   Family History  Problem Relation Age of Onset  . Stroke Mother   . Osteoarthritis Mother     Died at age 52  . Heart disease Father   . Lung cancer Father   . Osteoporosis Sister   . Diabetes Brother   . Breast cancer Paternal Aunt   . Heart attack Brother 62  . Diabetes Brother   . Diabetes Brother   . Heart attack Brother 61  . Heart failure Father     Died at age 14  . Heart failure Maternal Grandmother     Died at age 90, unsure of her heart disease  . Heart failure Paternal Grandfather     Died at age 24, unsure of cardiac disease.   ROS: A comprehensive Review of Systems - Negative except Pertinent positives noted above and other positives below. Psychological ROS: positive for - anxiety and Significant social stressors dealing with her husbands health. ENT ROS: positive for - nasal congestion, nasal discharge, sneezing, vocal changes and hoarse voice with mild sore throat Allergy and Immunology ROS: positive for - seasonal allergies Respiratory ROS: positive for - cough, shortness of breath and wheezing  PHYSICAL EXAM BP 120/60  Pulse 56  Ht 5\' 3"  (1.6 m)  Wt 174 lb 8 oz (79.153 kg)  BMI 30.92 kg/m2 General appearance: alert, cooperative, appears stated age, no distress and Does have a mild hoarse voice and sounds somewhat congested. She is coughing a few times in the office but not actively wheezing. Neck: no carotid bruit, no JVD and supple, symmetrical, trachea midline Lungs: Coarse, rhonchorous breath sounds, a basal with inspiratory & expiratory wheezing. Heart:  bradycardic irregular rhythm, S1, S2 normal, no S3 or S4, no rub and  No gallop. 2/6 SEM,  Crescendo/decrescendo at RUSB Abdomen: Soft/NT/M.D. with the exception of mild area that was noted in the central epigastric area but is a slight distended area.  Otherwise NABS and no HSM. Extremities:  there is a mild amount of edema right leg is somewhat tense but definitely tender to palpitation. There is no bruising noted and no significant change in range of motion. Is not pitting edema is noted more tenseness. There are some ropelike varicose both legs. Pulses: 2+ and symmetric Skin: Skin color, texture, turgor normal. No rashes or lesions Neurologic: Grossly normal , CN 2-12 grossly intact HEENT: [place]/AT, EOMI, MMM, anicteric sclera  [id number] today: Yes Rate: 88 , Rhythm: Sinus rhythm with frequent PACs, and brief runs of paroxysmal atrial tachycardia. Left atrial abnormality. No ischemic changes.  Recent Labs: Reviewed on Epic  ASSESSMENT: We've yet to have her echocardiogram results evaluate her abnormal ECG an arrhythmia. She does or doesn't have much symptoms for that now. Her biggest concern is her leg.   Varicose veins of both lower extremities with pain - Plan: Lower Extremity Venous Duplex, CANCELED: Lower Extremity Venous Reflux Bilateral, CANCELED: Lower Extremity Venous Duplex Bilateral  Lower extremity edema - Plan: Lower Extremity Venous Duplex, CANCELED: Lower Extremity Venous Reflux Bilateral, CANCELED: Lower Extremity Venous Duplex Bilateral  PLAN: Per problem list. Orders Placed This Encounter  Procedures  . Lower Extremity Venous Duplex    Standing Status: Future     Number of Occurrences:      Standing Expiration Date: 08/22/2013    Scheduling Instructions:     HX DVT R/O DVT    Order Specific Question:  Where should this test be performed:    Answer:  [facility]   Followup: Roughly 2 weeks following  lotion with Dopplers and echocardiogram.  Leonie Man, M.D., M.S. [facility] [address] [place], [place]  [address]  [phone] Pager # [phone]

## 2012-08-27 NOTE — Assessment & Plan Note (Signed)
Echograms to pending.

## 2012-08-27 NOTE — Assessment & Plan Note (Signed)
Motion review his Dopplers for both DVT versus venous insufficiency.

## 2012-08-27 NOTE — Assessment & Plan Note (Signed)
The order lateral lower extremity venous Dopplers as well as venous insufficiency Dopplers to look for either DVT or valvular insufficiency. I do not think that this is an acute DVT however.

## 2012-08-31 ENCOUNTER — Ambulatory Visit (HOSPITAL_COMMUNITY)
Admission: RE | Admit: 2012-08-31 | Discharge: 2012-08-31 | Disposition: A | Discharge status: Home / Self Care | Payer: Medicare Other | Source: Ambulatory Visit | Attending: Cardiovascular Disease | Admitting: Cardiovascular Disease

## 2012-08-31 ENCOUNTER — Ambulatory Visit (HOSPITAL_COMMUNITY): Payer: Medicare Other

## 2012-08-31 ENCOUNTER — Ambulatory Visit (HOSPITAL_BASED_OUTPATIENT_CLINIC_OR_DEPARTMENT_OTHER)
Admission: RE | Admit: 2012-08-31 | Discharge: 2012-08-31 | Disposition: A | Discharge status: Home / Self Care | Payer: Medicare Other | Source: Ambulatory Visit | Attending: Cardiology | Admitting: Cardiology

## 2012-08-31 DIAGNOSIS — Z86718 Personal history of other venous thrombosis and embolism: Secondary | ICD-10-CM | POA: Insufficient documentation

## 2012-08-31 DIAGNOSIS — I83893 Varicose veins of bilateral lower extremities with other complications: Secondary | ICD-10-CM | POA: Insufficient documentation

## 2012-08-31 DIAGNOSIS — I491 Atrial premature depolarization: Secondary | ICD-10-CM | POA: Insufficient documentation

## 2012-08-31 DIAGNOSIS — R011 Cardiac murmur, unspecified: Secondary | ICD-10-CM | POA: Insufficient documentation

## 2012-08-31 DIAGNOSIS — R600 Localized edema: Secondary | ICD-10-CM

## 2012-08-31 DIAGNOSIS — R609 Edema, unspecified: Secondary | ICD-10-CM | POA: Insufficient documentation

## 2012-08-31 DIAGNOSIS — R9431 Abnormal electrocardiogram [ECG] [EKG]: Secondary | ICD-10-CM | POA: Insufficient documentation

## 2012-08-31 DIAGNOSIS — I358 Other nonrheumatic aortic valve disorders: Secondary | ICD-10-CM

## 2012-08-31 DIAGNOSIS — I471 Supraventricular tachycardia: Secondary | ICD-10-CM

## 2012-08-31 DIAGNOSIS — I83813 Varicose veins of bilateral lower extremities with pain: Secondary | ICD-10-CM

## 2012-08-31 NOTE — Progress Notes (Signed)
[facility]   2D echo completed 08/31/2012.   Jamison Neighbor, RDCS

## 2012-08-31 NOTE — Progress Notes (Signed)
Lower Extremity Venous Duplex Completed. Shirley [name]

## 2012-09-07 ENCOUNTER — Encounter: Payer: Self-pay | Admitting: Cardiology

## 2012-09-07 ENCOUNTER — Ambulatory Visit (INDEPENDENT_AMBULATORY_CARE_PROVIDER_SITE_OTHER): Payer: Medicare Other | Admitting: Cardiology

## 2012-09-07 VITALS — BP 140/60 | HR 66 | Ht 66.0 in | Wt 182.4 lb

## 2012-09-07 DIAGNOSIS — R0609 Other forms of dyspnea: Secondary | ICD-10-CM

## 2012-09-07 DIAGNOSIS — I359 Nonrheumatic aortic valve disorder, unspecified: Secondary | ICD-10-CM

## 2012-09-07 DIAGNOSIS — I83893 Varicose veins of bilateral lower extremities with other complications: Secondary | ICD-10-CM

## 2012-09-07 DIAGNOSIS — R609 Edema, unspecified: Secondary | ICD-10-CM

## 2012-09-07 DIAGNOSIS — I491 Atrial premature depolarization: Secondary | ICD-10-CM

## 2012-09-07 DIAGNOSIS — R600 Localized edema: Secondary | ICD-10-CM

## 2012-09-07 DIAGNOSIS — I83813 Varicose veins of bilateral lower extremities with pain: Secondary | ICD-10-CM

## 2012-09-07 DIAGNOSIS — I358 Other nonrheumatic aortic valve disorders: Secondary | ICD-10-CM

## 2012-09-07 MED ORDER — METOPROLOL TARTRATE 25 MG PO TABS: ORAL_TABLET | ORAL | Status: DC

## 2012-09-07 NOTE — Patient Instructions (Addendum)
Your Echocardiogram had some abnormalities that we discussed: (the first 3 may be related to exertional shortness of breath); the last 2 are related to the murmurs  Heart muscle (Left Ventricle) is a bit thickened - making it not relax as well as normal (probably due to high blood pressure)  Partly as a result of the abnormal relaxing , your Left Atrium is stretched out -- this makes you more susceptible to developing Atria Fibrillation  The pressures in the Right side of your hear are moderately elevated  Your Aortic valve has some mild calcium build -up leading to turbulent blood flow, (not a narrowing, so no much concern)  The valve between your Right Atrium and Ventricle does not keep blood from going backwards fully - called Tricuspid regurgitation. -- also sign of high Right Sided pressures  To treat you borderline blood pressure & palpitations, I am prescribing Metoprolol 12.5 mg daily (this will be a 1/2 tab of a 25 mg tab 2 x daily)  IF Swelling continue and bother you  Please call will get you an appointment with Dr Larene Beach   Leonie Man, MD

## 2012-09-10 ENCOUNTER — Encounter: Payer: Self-pay | Admitting: Cardiology

## 2012-09-10 NOTE — Progress Notes (Signed)
Patient ID: Shirley [name], female   DOB: [birth year], 72 y.o.   MRN: [id number] PCP: Shirley Ports, MD  Clinic Note: Chief Complaint  Patient presents with  . Follow-up    results echo ,venous doppler   HPI: Shirley [name] is a 72 y.o. female with a PMH below I initially saw on June 18 to evaluate and abnormal ECG and cardiac arrhythmia.  I then saw her back shortly thereafter on July 1, before her echocardiogram was done when she was noticing significant pain and swelling in her lower extremities. The worst was in her right leg. She then had lower extremity Dopplers performed as well as echocardiogram and is now here for followup.    She continues to note that her palpitations have improved, but still persistent. She also notes that the pain in her leg is less pronounced. She continues to deny chest pain with rest or exertion, but does note mild shortness of breath with exertion still, but improving after getting over her recent bronchitis episode. She has no syncope or near  syncope symptoms, but does feel a little lightheaded when the palpitations occur.Marland Kitchen No PND or orthopnea, and the edema is improved. No claudication symptoms. No melena hematochezia or hematuria.  Study Results:  09/01/2012 Lower Extremity Venous Dopplers: uNo evidence of thrombus or, phlebitis. Right and Left Greater Saphenous Veins appear enlarged and demonstrate valvular insufficiency; Right Short Saphenous Vein appears enlarged and demonstrates valvular insufficiency of 3 second duration; Left Short Saphenous Vein appears patent with no valvular insufficiency.  Results were reviewed by Dr. Debara [name], who felt that while the greater saphenous veins and right short saphenous vein certainly met criteria for venous insufficiency, they were 2 superficial and tortuous to be amenable for venous ablation by the VNUS technique.   He recommended that we consider referral toDr. Eilleen [name] at [facility] for this   2-D Echocardiogram: Mild  LVH/concentric remodeling. EF 60-65%. Grade 2 (pseudo-normal) diastolic dysfunction with elevated LVEDP/LAP. Aortic sclerosis without stenosis. Massively dilated left atrium, moderately elevated pulmonary arterial pressure of roughly 45 mmHg. Mildly dilated right atrium. No clear-cut etiology for murmur, however tricuspid regurgitation or aortic sclerosis as possible.  Past Medical History  Diagnosis Date  . Edema   . Vision problem     wears glasses  . Chronic pain   . Depression   . BV (bacterial vaginosis)     history of frequent  . Allergy     vasomotor rhinitis  . Asthma     h/o asthma and bronchitis  . GERD (gastroesophageal reflux disease)     h/o  . Varicose veins   . Osteopenia     mild at hip (T-1.3)  . Cervical spondylosis     herniated disk protruding @ C6-7, impinging cord and left axillary root sleeve.  . Vitamin D deficiency   . Fibromyalgia     sees Shirley [name]-pain management  . DVT of upper extremity (deep vein thrombosis)     h/o left(after wrist fracture/surgery)    Prior Cardiac Evaluation and Past Surgical History: ~20+ orthopedic surgeries Past Surgical History  Procedure Laterality Date  . Appendectomy    . Tonsillectomy and adenoidectomy    . Back surgery  age 8    ruptured disk L3-4   . Neck surgery  2007  . Knee surgery      right knee replacement  . Shoulder surgery      x8 (4 on rt side and 4 on left side)  .  Wrist surgery      2 surgeries left wrist after fracture(complicated by DVT)  . Foot surgery      multiple(at least 4 on right, 5 on left)-left Shirley [name]11/08,left Shirley [name] 2nd and 3rd mt heads w/artho and hammertoe surgery 4th and 5th 04/2006. left great toe IP fusion and revision of fusion of 2nd through 5th toes 12/2005. Right foot surgeries  Shirley [name] 04/2008 and 04/2009.  . Abdominal hysterectomy  1999    BSO for endometriosis  . Cataract extraction  2000    B/L  . Refractive surgery  2007    left eye  . Finger  surgery      left finger for tender rupture from fall.  Marland Kitchen Spinal fusion  6/09    Shirley [name]   No Known Allergies  Current Outpatient Prescriptions  Medication Sig Dispense Refill  . albuterol (PROVENTIL HFA;VENTOLIN HFA) 108 (90 BASE) MCG/ACT inhaler Inhale 2 puffs into the lungs every 6 (six) hours as needed for wheezing.  1 Inhaler  1  . ALPRAZolam (XANAX) 1 MG tablet Take 1 mg by mouth at bedtime as needed.      . Cholecalciferol (VITAMIN D3) 5000 UNITS CAPS Take by mouth.      . Fexofenadine HCl (ALLEGRA PO) Take by mouth.      . gabapentin (NEURONTIN) 300 MG capsule Take 300 mg by mouth 4 (four) times daily.       Marland Kitchen oxycodone (ROXICODONE) 30 MG immediate release tablet Take 30 mg by mouth every 4 (four) hours as needed.      . TURMERIC PO Take by mouth.       No current facility-administered medications for this visit.    History   Social History  . Marital Status: Married    Spouse Name: Shirley [name]    Number of Children: N/A  . Years of Education: N/A   Occupational History  . Not on file.   Social History Main Topics  . Smoking status: Former Smoker    Quit date: 02/14/1957  . Smokeless tobacco: Never Used     Comment: + h/o passive exposure (second hand) from family, husband (none currently)  . Alcohol Use: No  . Drug Use: No  . Sexually Active: Not on file   Other Topics Concern  . Not on file   Social History Narrative   Lives with husband.  3 children (Shirley [name], Shirley [name], Shirley [name]), 5 grandchildren    ROS: A comprehensive Review of Systems was Negative except Pertinent positives noted above and other positives below. Psychological ROS: positive for - anxiety and Significant social stressors dealing with her husbands health. ENT ROS: positive for - nasal congestion, nasal discharge, sneezing, vocal changes and hoarse voice with mild sore throat  Allergy and Immunology ROS: positive for - seasonal allergies Respiratory ROS: positive for - significantly improved  symptoms of cough, shortness of breath and wheezing  PHYSICAL EXAM BP 140/60  Pulse 66  Ht 5\' 6"  (1.676 m)  Wt 182 lb 6.4 oz (82.736 kg)  BMI 29.45 kg/m2 General appearance: A & O. x3, cooperative, appears stated age, no distress and Does have a mild hoarse voice and sounds somewhat congested. She is coughing a few times in the office but not actively wheezing. Neck: no carotid bruit, no JVD and supple, symmetrical, trachea midline Lungs: Coarse, rhonchorous breath sounds, a basal with inspiratory & expiratory wheezing. Heart:  bradycardic irregular rhythm, S1, S2 normal, no S3 or S4, no rub and No gallop. 2/6 SEM,  Crescendo/decrescendo at  RUSB Abdomen: Soft/NT/ND. with the exception of mild area that was noted in the central epigastric area but is a slight distended area. Otherwise NABS and no HSM. Extremities:  there is a mild amount of edema right leg is somewhat tense but definitely tender to palpitation. There is no bruising noted and no significant change in range of motion. Is not pitting edema is noted more tenseness. There are some ropelike varicose both legs. Pulses: 2+ and symmetric   [id number] today: Yes Rate: 88 , Rhythm: Sinus rhythm with frequent PACs, and brief runs of paroxysmal atrial tachycardia. Left atrial abnormality. No ischemic changes.  Recent Labs: Reviewed on Epic  ASSESSMENT /PLAN:  No diagnosis found.   . metoprolol tartrate (LOPRESSOR) 25 MG tablet Take 1/2 tablet by mouth twice a day  90 tablet  3    Followup: Roughly 2 weeks following  lotion with Dopplers and echocardiogram.  Leonie Man, M.D., M.S. [facility] [place] [place], [place]  [id number]  [phone] Pager # [phone]

## 2012-09-10 NOTE — Assessment & Plan Note (Signed)
With her edema along with venous insufficiency, the major recommendation here would be keep the feet elevated in the day and compression stockings. She didn't admitted to the compression stockings but was considered the idea support hose.

## 2012-09-10 NOTE — Assessment & Plan Note (Signed)
Clearly she does have venous insufficiency in the greater saphenous veins on the right short saphenous vein, unfortunately not amenable to the venous ablation procedures performed here by Dr. Debara Pickett. She is not overly excited about the concept of treatment, however I'll be happy to refer her to Dr. Eilleen Kempf from [facility]. She is planning to think about it and contact us.

## 2012-09-10 NOTE — Assessment & Plan Note (Signed)
She did have aortic sclerosis but not stenosis. I explained her that this was relatively benign. We don't follow if the murmur gets a lot louder.

## 2012-09-10 NOTE — Assessment & Plan Note (Addendum)
This is probably is much related to her allergies, but it may be also probably related to her elevated point pressures and diastolic dysfunction. I was surprised to see the center of left atrial dilatation which would suggest that she does have long-standing diastolic dysfunction.  if his symptoms were to persist or worsen. I would consider evaluation with a stress test. However it does not seem to be significant enough for her to be concerned about it significantly.

## 2012-09-10 NOTE — Assessment & Plan Note (Addendum)
With borderline blood pressure control. We'll start beta blocker -- Lopressor 12.5 mg twice a day. Can likely titrate up to at least 25 twice a day rapidly.

## 2012-09-27 ENCOUNTER — Telehealth: Payer: Self-pay | Admitting: Family Medicine

## 2012-09-27 NOTE — Telephone Encounter (Signed)
JUST SAW APPT NOTE. PT CANCELLED APPT.

## 2012-09-27 NOTE — Telephone Encounter (Signed)
PT STATES SHE RECEIVED A LETTER FROM YOU TELLING PT SHE NEEDED TO CONTACT THE OFFICE THAT WE WERE UNABLE TO REACH HER. I DONT SEE ANY LETTER IN CHART. PLEASE ADVISE. ALSO PT INFORMED ME SHE WAS TRANSFERRING OUT.

## 2012-10-02 ENCOUNTER — Other Ambulatory Visit: Payer: Self-pay | Admitting: Family Medicine

## 2012-10-07 ENCOUNTER — Telehealth: Payer: Self-pay | Admitting: Internal Medicine

## 2012-10-07 NOTE — Telephone Encounter (Signed)
Faxed over medical records to [facility] at [phone]

## 2012-11-13 ENCOUNTER — Other Ambulatory Visit: Payer: Self-pay | Admitting: Orthopaedic Surgery

## 2012-11-13 DIAGNOSIS — M479 Spondylosis, unspecified: Secondary | ICD-10-CM

## 2012-11-16 ENCOUNTER — Encounter: Payer: Medicare Other | Admitting: Family Medicine

## 2012-11-21 ENCOUNTER — Ambulatory Visit
Admission: RE | Admit: 2012-11-21 | Discharge: 2012-11-21 | Disposition: A | Discharge status: Home / Self Care | Payer: Medicare Other | Source: Ambulatory Visit | Attending: Orthopaedic Surgery | Admitting: Orthopaedic Surgery

## 2012-11-21 DIAGNOSIS — M479 Spondylosis, unspecified: Secondary | ICD-10-CM

## 2013-01-25 ENCOUNTER — Ambulatory Visit: Payer: Medicare Other | Admitting: Cardiology

## 2013-02-07 ENCOUNTER — Telehealth: Payer: Self-pay | Admitting: *Deleted

## 2013-02-07 ENCOUNTER — Other Ambulatory Visit: Payer: Self-pay | Admitting: Family Medicine

## 2013-02-07 NOTE — Telephone Encounter (Signed)
This was prescribed for acute illness back in June.  Needs OV

## 2013-02-07 NOTE — Telephone Encounter (Signed)
Is this okay to refill? 

## 2013-02-07 NOTE — Telephone Encounter (Signed)
Spoke with patient and she did not request this medication.

## 2013-02-23 ENCOUNTER — Ambulatory Visit: Payer: Medicare Other | Admitting: Cardiology

## 2013-03-29 ENCOUNTER — Other Ambulatory Visit: Payer: Self-pay | Admitting: *Deleted

## 2013-04-02 ENCOUNTER — Other Ambulatory Visit: Payer: Self-pay | Admitting: Family Medicine

## 2013-08-08 ENCOUNTER — Ambulatory Visit: Payer: Medicare Other | Admitting: Cardiology

## 2013-10-31 ENCOUNTER — Encounter: Payer: Self-pay | Admitting: Cardiology

## 2013-10-31 ENCOUNTER — Ambulatory Visit (INDEPENDENT_AMBULATORY_CARE_PROVIDER_SITE_OTHER): Payer: Medicare Other | Admitting: Cardiology

## 2013-10-31 VITALS — BP 120/70 | HR 95 | Ht 66.0 in | Wt 171.9 lb

## 2013-10-31 DIAGNOSIS — I83813 Varicose veins of bilateral lower extremities with pain: Secondary | ICD-10-CM

## 2013-10-31 DIAGNOSIS — R609 Edema, unspecified: Secondary | ICD-10-CM

## 2013-10-31 DIAGNOSIS — I358 Other nonrheumatic aortic valve disorders: Secondary | ICD-10-CM

## 2013-10-31 DIAGNOSIS — R9431 Abnormal electrocardiogram [ECG] [EKG]: Secondary | ICD-10-CM

## 2013-10-31 DIAGNOSIS — I359 Nonrheumatic aortic valve disorder, unspecified: Secondary | ICD-10-CM

## 2013-10-31 DIAGNOSIS — R600 Localized edema: Secondary | ICD-10-CM

## 2013-10-31 DIAGNOSIS — R0609 Other forms of dyspnea: Secondary | ICD-10-CM

## 2013-10-31 DIAGNOSIS — R0989 Other specified symptoms and signs involving the circulatory and respiratory systems: Secondary | ICD-10-CM

## 2013-10-31 DIAGNOSIS — I83893 Varicose veins of bilateral lower extremities with other complications: Secondary | ICD-10-CM

## 2013-10-31 DIAGNOSIS — I471 Supraventricular tachycardia: Secondary | ICD-10-CM

## 2013-10-31 MED ORDER — METOPROLOL TARTRATE 25 MG PO TABS: ORAL_TABLET | ORAL | Status: DC

## 2013-10-31 NOTE — Patient Instructions (Signed)
NO CHANGE TO CURRENT MEDICATIONS  Your physician wants you to follow-up in 80 MONTH Dr Ellyn Hack. You will receive a reminder letter in the mail two months in advance. If you don't receive a letter, please call our office to schedule the follow-up appointment.

## 2013-11-03 ENCOUNTER — Encounter: Payer: Self-pay | Admitting: Cardiology

## 2013-11-03 NOTE — Assessment & Plan Note (Signed)
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  Refill beta blocker

## 2013-11-03 NOTE — Assessment & Plan Note (Signed)
She has had various PACs and PAT in the past. I think her heart rate being a bit fast and may be related to her being off her beta blocker. Will refill her metoprolol. I do not think that this is atrial fibrillation, and therefore we did not discuss anticoagulation. Also it is not overly worrisome for her.

## 2013-11-03 NOTE — Assessment & Plan Note (Signed)
Not currently a concern for her.

## 2013-11-03 NOTE — Progress Notes (Signed)
PCP: Ardith Dark, PA-C  Clinic Note: Chief Complaint  Patient presents with  . Follow-up    1 year visit. pt denies chest pain and sob. she does have swelling in hands and feet.    HPI: Shirley [name] is a 73 y.o. female with a Cardiovascular Problem List below who presents today for annual followup. She was initially seen in June of 2014 for her abnormal EKG as well as cardiac arrhythmia. She had relatively normal echocardiogram, but noted to have aortic sclerosis as the cause of her murmur. She had her varicose veins and venous stasis evaluated with Dopplers that didn't show reflux but unfortunately when she was not amenable for VNUS ablation based on the tortuosity and superficial nature of the vessels. At that time she declined being referred to [facility]. She also noted intermittent palpitations.  Interval History: She just today really going relatively well. Is a question of whether or not her EKG said A. fib versus atrial tachycardia. She definitely notes irregular heartbeats but is bothersome to her and the nurse to notice that goes very fast. Apparently over the last 2 weeks she ran out of her metoprolol in the night it refilled yet. Since then that the palpitations have picked up a little but more. She denies any chest pressure rest or exertion. No resting or exertional dyspnea. She is active but does not get routine exercise. She has been spending a lot of time taking care of her husband and has been less diligent with exercise. She still has varicose veins but doesn't have much discomfort or tenderness. The veins are tender but the legs are not bothersome. She's not noted any PND orthopnea, syncope/near-syncope, TIA/amaurosis fugax.  Past Medical History  Diagnosis Date  . Edema   . Vision problem     wears glasses  . Chronic pain   . Depression   . BV (bacterial vaginosis)     history of frequent  . Allergy     vasomotor rhinitis  . Asthma     h/o  asthma and bronchitis  . GERD (gastroesophageal reflux disease)     h/o  . Varicose veins   . Osteopenia     mild at hip (T-1.3)  . Cervical spondylosis     herniated disk protruding @ C6-7, impinging cord and left axillary root sleeve.  . Vitamin D deficiency   . Fibromyalgia     sees Dr.Phillips-pain management  . DVT of upper extremity (deep vein thrombosis)     h/o left(after wrist fracture/surgery); no residual thrombus or phlebitis by Dopplers July 2014  . Varicose veins of both legs with edema 08/2012    Also pain; bilateral GSV dilated with reflux, R Short SV dilated with reflux; not amenable to VNUS ablation due to tortuosity and superficial nature of veins -- recommeded referral to Dr. Eilleen Kempf @ [facility]    Prior Cardiac Evaluation and Past Surgical History: Past Surgical History  Procedure Laterality Date  . Wrist surgery      2 surgeries left wrist after fracture(complicated by DVT)  . Foot surgery      multiple(at least 4 on right, 5 on left)-left Dr.Kerner([place])11/08,left Dr.Nunley-excision 2nd and 3rd mt heads w/artho and hammertoe surgery 4th and 5th 04/2006. left great toe IP fusion and revision of fusion of 2nd through 5th toes 12/2005. Right foot surgeries  Dr.Bednarz 04/2008 and 04/2009.  . Abdominal hysterectomy  1999    BSO for endometriosis  . Spinal fusion  6/09    Dr.Cohen  . Doppler echocardiography  July 2012    Normal EF 60-65% , mild concentric LVH. Grade 2 diastolic dysfunction with elevated EDP. Massive left atrial dilation. Pulmonary pressures 30-40 mm Hg; aortic sclerosis but no stenosis. Moderate TR    MEDICATIONS AND ALLERGIES REVIEWED IN EPIC No Change in Social and Family History  ROS: A comprehensive Review of Systems - was performed Review of Systems  Constitutional: Negative for fever and chills.  HENT: Positive for congestion. Negative for nosebleeds.        Seasonal allergies  Respiratory: Negative for cough, hemoptysis,  sputum production, shortness of breath and wheezing.   Cardiovascular: Positive for palpitations and leg swelling.       Also noted in hands. Otherwise Noted in history of present illness  Gastrointestinal: Negative for blood in stool and melena.  Genitourinary: Negative for hematuria.  Neurological: Negative for dizziness, sensory change, speech change, focal weakness, seizures and loss of consciousness.  Endo/Heme/Allergies: Does not bruise/bleed easily.  Psychiatric/Behavioral: Negative for depression. The patient is nervous/anxious. The patient does not have insomnia.        Husband has recently been in and out of hospital. Had 2 strokes  All other systems reviewed and are negative.   Wt Readings from Last 3 Encounters:  10/31/13 171 lb 14.4 oz (77.973 kg)  09/07/12 182 lb 6.4 oz (82.736 kg)  08/22/12 174 lb 8 oz (79.153 kg)   PHYSICAL EXAM BP 120/70  Pulse 95  Ht 5\' 6"  (1.676 m)  Wt 171 lb 14.4 oz (77.973 kg)  BMI 27.76 kg/m2 General appearance: A & O. x3, cooperative, appears stated age, no distress and Does have a mild hoarse voice and sounds somewhat congested. She is coughing a few times in the office but not actively wheezing.  Neck: no carotid bruit, no JVD and supple, symmetrical, trachea midline  Lungs: Coarse, rhonchorous breath sounds, a basal with inspiratory & expiratory wheezing.  Heart: IrregularLY irregular rhythm, S1, S2 normal, no S3 or S4, no rub and No gallop. 2/6 SEM, Crescendo/decrescendo at RUSB  Abdomen: Soft/NT/ND. with the exception of mild area that was noted in the central epigastric area but is a slight distended area. Otherwise NABS and no HSM.  Extremities: there is a mild amount of edema right leg is somewhat tense but definitely tender to palpitation. There are some ropelike varicose both legs.  Pulses: 2+ and symmetric   Adult ECG Report  Rate: 95 ;  Rhythm: indeterminate and Appears to be wandering atrial pacemaker. There are multiple different  P-wave morphologies. Computer reading was atrial fibrillation. Discussed with colleague, we determined this was not atrial fib  Recent Labs not available:   ASSESSMENT / PLAN: Abnormal resting ECG findings - sinus rhythm with PACs, and PAT She has had various PACs and PAT in the past. I think her heart rate being a bit fast and may be related to her being off her beta blocker. Will refill her metoprolol. I do not think that this is atrial fibrillation, and therefore we did not discuss anticoagulation. Also it is not overly worrisome for her.  Varicose veins of both lower extremities with pain She decided not to pursue any invasive treatment for her varicose veins. She does wear her compression stockings most the time.  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  Refill beta blocker  Exertional dyspnea Not currently a concern for her.  Aortic systolic murmur on examination No need to follow with serial echo unless murmur increases    Orders Placed This Encounter  Procedures  . EKG 12-Lead   Meds ordered this encounter  Medications  . PARoxetine (PAXIL) 10 MG tablet    Sig: Take 10 mg by mouth.  . metoprolol tartrate (LOPRESSOR) 25 MG tablet    Sig: Take 1/2 tablet by mouth twice a day    Dispense:  90 tablet    Refill:  3    Followup: One year   Leonie Man, M.D., M.S. Interventional Cardiologist   Pager # [phone]

## 2013-11-03 NOTE — Assessment & Plan Note (Signed)
She decided not to pursue any invasive treatment for her varicose veins. She does wear her compression stockings most the time.

## 2013-11-03 NOTE — Assessment & Plan Note (Signed)
No need to follow with serial echo unless murmur increases

## 2013-11-06 ENCOUNTER — Other Ambulatory Visit (HOSPITAL_COMMUNITY): Payer: Self-pay | Admitting: Family Medicine

## 2013-11-06 DIAGNOSIS — I8310 Varicose veins of unspecified lower extremity with inflammation: Secondary | ICD-10-CM

## 2013-11-06 DIAGNOSIS — I499 Cardiac arrhythmia, unspecified: Secondary | ICD-10-CM

## 2013-11-09 ENCOUNTER — Ambulatory Visit (HOSPITAL_BASED_OUTPATIENT_CLINIC_OR_DEPARTMENT_OTHER)
Admission: RE | Admit: 2013-11-09 | Discharge: 2013-11-09 | Disposition: A | Discharge status: Home / Self Care | Payer: Medicare Other | Source: Ambulatory Visit | Attending: Cardiology | Admitting: Cardiology

## 2013-11-09 ENCOUNTER — Ambulatory Visit (HOSPITAL_COMMUNITY)
Admission: RE | Admit: 2013-11-09 | Discharge: 2013-11-09 | Disposition: A | Discharge status: Home / Self Care | Payer: Medicare Other | Source: Ambulatory Visit | Attending: Cardiology | Admitting: Cardiology

## 2013-11-09 DIAGNOSIS — I499 Cardiac arrhythmia, unspecified: Secondary | ICD-10-CM | POA: Insufficient documentation

## 2013-11-09 DIAGNOSIS — M79609 Pain in unspecified limb: Secondary | ICD-10-CM

## 2013-11-09 DIAGNOSIS — M7989 Other specified soft tissue disorders: Secondary | ICD-10-CM | POA: Insufficient documentation

## 2013-11-09 DIAGNOSIS — I8310 Varicose veins of unspecified lower extremity with inflammation: Secondary | ICD-10-CM

## 2013-11-09 DIAGNOSIS — I872 Venous insufficiency (chronic) (peripheral): Secondary | ICD-10-CM | POA: Diagnosis not present

## 2013-11-09 DIAGNOSIS — I70219 Atherosclerosis of native arteries of extremities with intermittent claudication, unspecified extremity: Secondary | ICD-10-CM

## 2013-11-09 NOTE — Progress Notes (Signed)
Venous Duplex Lower Ext. Completed. Perle Brickhouse, BS, RDMS, RVT  

## 2013-11-09 NOTE — Progress Notes (Addendum)
Arterial Duplex Lower ext. Completed. Oda Cogan, BS, RDMS, RVT

## 2015-01-13 ENCOUNTER — Other Ambulatory Visit (HOSPITAL_COMMUNITY): Payer: Self-pay | Admitting: Orthopedic Surgery

## 2015-01-13 DIAGNOSIS — Z96651 Presence of right artificial knee joint: Secondary | ICD-10-CM

## 2015-01-14 ENCOUNTER — Other Ambulatory Visit: Payer: Self-pay | Admitting: Orthopedic Surgery

## 2015-01-22 ENCOUNTER — Encounter (HOSPITAL_COMMUNITY)
Admission: RE | Admit: 2015-01-22 | Discharge: 2015-01-22 | Disposition: A | Discharge status: Home / Self Care | Payer: PPO | Source: Ambulatory Visit | Attending: Orthopedic Surgery | Admitting: Orthopedic Surgery

## 2015-01-22 DIAGNOSIS — Z96651 Presence of right artificial knee joint: Secondary | ICD-10-CM | POA: Diagnosis not present

## 2015-01-22 MED ORDER — TECHNETIUM TC 99M MEDRONATE IV KIT
25.0000 | PACK | Freq: Once | INTRAVENOUS | Status: AC | PRN
  Administered 2015-01-22: 25 via INTRAVENOUS

## 2015-02-25 DIAGNOSIS — N760 Acute vaginitis: Secondary | ICD-10-CM | POA: Diagnosis not present

## 2015-02-25 DIAGNOSIS — Z124 Encounter for screening for malignant neoplasm of cervix: Secondary | ICD-10-CM | POA: Diagnosis not present

## 2015-02-25 DIAGNOSIS — Z1272 Encounter for screening for malignant neoplasm of vagina: Secondary | ICD-10-CM | POA: Diagnosis not present

## 2015-02-25 DIAGNOSIS — Z6831 Body mass index (BMI) 31.0-31.9, adult: Secondary | ICD-10-CM | POA: Diagnosis not present

## 2015-02-25 DIAGNOSIS — Z01419 Encounter for gynecological examination (general) (routine) without abnormal findings: Secondary | ICD-10-CM | POA: Diagnosis not present

## 2015-03-24 DIAGNOSIS — R296 Repeated falls: Secondary | ICD-10-CM | POA: Diagnosis not present

## 2015-03-24 DIAGNOSIS — M25532 Pain in left wrist: Secondary | ICD-10-CM | POA: Diagnosis not present

## 2015-03-24 DIAGNOSIS — M79642 Pain in left hand: Secondary | ICD-10-CM | POA: Diagnosis not present

## 2015-03-24 DIAGNOSIS — M84434A Pathological fracture, left radius, initial encounter for fracture: Secondary | ICD-10-CM | POA: Diagnosis not present

## 2015-03-24 DIAGNOSIS — Z9889 Other specified postprocedural states: Secondary | ICD-10-CM | POA: Diagnosis not present

## 2015-03-24 DIAGNOSIS — M84432A Pathological fracture, left ulna, initial encounter for fracture: Secondary | ICD-10-CM | POA: Diagnosis not present

## 2015-03-24 DIAGNOSIS — M11232 Other chondrocalcinosis, left wrist: Secondary | ICD-10-CM | POA: Diagnosis not present

## 2015-03-24 DIAGNOSIS — M25552 Pain in left hip: Secondary | ICD-10-CM | POA: Diagnosis not present

## 2015-04-03 DIAGNOSIS — G894 Chronic pain syndrome: Secondary | ICD-10-CM | POA: Diagnosis not present

## 2015-04-03 DIAGNOSIS — M7062 Trochanteric bursitis, left hip: Secondary | ICD-10-CM | POA: Diagnosis not present

## 2015-04-03 DIAGNOSIS — M4120 Other idiopathic scoliosis, site unspecified: Secondary | ICD-10-CM | POA: Diagnosis not present

## 2015-04-03 DIAGNOSIS — M549 Dorsalgia, unspecified: Secondary | ICD-10-CM | POA: Diagnosis not present

## 2015-04-03 DIAGNOSIS — M542 Cervicalgia: Secondary | ICD-10-CM | POA: Diagnosis not present

## 2015-04-03 DIAGNOSIS — M791 Myalgia: Secondary | ICD-10-CM | POA: Diagnosis not present

## 2015-04-07 DIAGNOSIS — I499 Cardiac arrhythmia, unspecified: Secondary | ICD-10-CM | POA: Diagnosis not present

## 2015-04-07 DIAGNOSIS — R609 Edema, unspecified: Secondary | ICD-10-CM | POA: Diagnosis not present

## 2015-04-07 DIAGNOSIS — Z9181 History of falling: Secondary | ICD-10-CM | POA: Diagnosis not present

## 2015-04-07 DIAGNOSIS — M25561 Pain in right knee: Secondary | ICD-10-CM | POA: Diagnosis not present

## 2015-04-07 DIAGNOSIS — G8929 Other chronic pain: Secondary | ICD-10-CM | POA: Diagnosis not present

## 2015-04-07 DIAGNOSIS — M961 Postlaminectomy syndrome, not elsewhere classified: Secondary | ICD-10-CM | POA: Diagnosis not present

## 2015-04-07 DIAGNOSIS — M25562 Pain in left knee: Secondary | ICD-10-CM | POA: Diagnosis not present

## 2015-04-08 ENCOUNTER — Ambulatory Visit: Payer: PPO | Admitting: Cardiology

## 2015-04-08 NOTE — Progress Notes (Signed)
Cardiology Office Note:    Date:  04/09/2015   ID:  Shirley [name], DOB [birth year], MRN [id number]  PCP:  Sheral Apley  Cardiologist:  Dr. Glenetta Hew   Electrophysiologist:  n/a  Chief Complaint  Patient presents with  . Tachycardia    Follow up    History of Present Illness:     Shirley [name] is a 75 y.o. female with a hx of abnormal ECG, PACs, atrial tachycardia, venous insufficiency. Last seen by Dr. Ellyn Hack 9/15. ECG appeared to demonstrate wandering atrial pacemaker with multiple different P-wave morphologies.  Patient saw primary care earlier this week after sustaining a fall. There was concern about her irregular rhythm and follow-up was arranged today. Blood pressure was also elevated.  She denies palpitations. She denies chest pain. She has chronic shortness of breath. It may be slightly worse. She denies coughing or wheezing. She denies orthopnea or PND. She does feel that her LE edema is worse. She does not take of torsemide very often. She denies syncope.  Past Medical History  Diagnosis Date  . Edema   . Vision problem     wears glasses  . Chronic pain   . Depression   . BV (bacterial vaginosis)     history of frequent  . Allergy     vasomotor rhinitis  . Asthma     h/o asthma and bronchitis  . GERD (gastroesophageal reflux disease)     h/o  . Varicose veins   . Osteopenia     mild at hip (T-1.3)  . Cervical spondylosis     herniated disk protruding @ C6-7, impinging cord and left axillary root sleeve.  . Vitamin D deficiency   . Fibromyalgia     sees Dr.Phillips-pain management  . DVT of upper extremity (deep vein thrombosis) ([place])     h/o left(after wrist fracture/surgery); no residual thrombus or phlebitis by Dopplers July 2014  . Varicose veins of both legs with edema 08/2012    Also pain; bilateral GSV dilated with reflux, R Short SV dilated with reflux; not amenable to VNUS ablation due to tortuosity and superficial nature of veins --  recommeded referral to Dr. Eilleen Kempf @ [facility]    Past Surgical History  Procedure Laterality Date  . Appendectomy    . Tonsillectomy and adenoidectomy    . Back surgery  age 62    ruptured disk L3-4   . Neck surgery  2007  . Knee surgery      right knee replacement  . Shoulder surgery      x8 (4 on rt side and 4 on left side)  . Wrist surgery      2 surgeries left wrist after fracture(complicated by DVT)  . Foot surgery      multiple(at least 4 on right, 5 on left)-left Dr.Kerner([place])11/08,left Dr.Nunley-excision 2nd and 3rd mt heads w/artho and hammertoe surgery 4th and 5th 04/2006. left great toe IP fusion and revision of fusion of 2nd through 5th toes 12/2005. Right foot surgeries  Dr.Bednarz 04/2008 and 04/2009.  . Abdominal hysterectomy  1999    BSO for endometriosis  . Cataract extraction  2000    B/L  . Refractive surgery  2007    left eye  . Finger surgery      left finger for tender rupture from fall.  Marland Kitchen Spinal fusion  6/09    Dr.Cohen  . Doppler echocardiography  July 2012    Normal EF 60-65% , mild  concentric LVH. Grade 2 diastolic dysfunction with elevated EDP. Massive left atrial dilation. Pulmonary pressures 30-40 mm Hg; aortic sclerosis but no stenosis. Moderate TR    Current Medications: Outpatient Prescriptions Prior to Visit  Medication Sig Dispense Refill  . ALPRAZolam (XANAX) 1 MG tablet Take 40 mg by mouth once daily as needed for sleep    . Cholecalciferol (VITAMIN D3) 5000 UNITS CAPS Take 5,000 Units by mouth 2 (two) times daily.     Marland Kitchen gabapentin (NEURONTIN) 300 MG capsule Take 300 mg by mouth 4 (four) times daily.     Marland Kitchen oxycodone (ROXICODONE) 30 MG immediate release tablet Take 30 mg by mouth every 4 (four) hours as needed for pain (for pain).     . metoprolol tartrate (LOPRESSOR) 25 MG tablet Take 1/2 tablet by mouth twice a day 90 tablet 3  . PARoxetine (PAXIL) 10 MG tablet Take 10 mg by mouth. Reported on 04/09/2015     No  facility-administered medications prior to visit.     Allergies:   No known allergies   Social History   Social History  . Marital Status: Married    Spouse Name: Martin  . Number of Children: N/A  . Years of Education: N/A   Social History Main Topics  . Smoking status: Former Smoker    Quit date: 02/14/1957  . Smokeless tobacco: Never Used     Comment: + h/o passive exposure (second hand) from family, husband (none currently)  . Alcohol Use: No  . Drug Use: No  . Sexual Activity: Not Asked   Other Topics Concern  . None   Social History Narrative   Lives with husband.  3 children ([place], [place], [place]), 5 grandchildren     Family History:  The patient's family history includes Breast cancer in her paternal aunt; Diabetes in her brother, brother, and brother; Heart attack (age of onset: 60) in her brother; Heart attack (age of onset: 76) in her brother; Heart disease in her father; Heart failure in her father, maternal grandmother, and paternal grandfather; Lung cancer in her father; Osteoarthritis in her mother; Osteoporosis in her sister; Stroke in her mother.   ROS:   Please see the history of present illness.    ROS All other systems reviewed and are negative.   Physical Exam:    VS:  BP 180/50 mmHg  Pulse 82  Ht 5\' 4"  (1.626 m)  Wt 188 lb (85.276 kg)  BMI 32.25 kg/m2   GEN: Well nourished, well developed, in no acute distress HEENT: normal Neck: large a waves noted, no masses Cardiac: Normal S1/S2, Irreg rhythm; early systolic murmur RUSB, [id number] bilateral LE edema    Respiratory: decreased breath sounds bilaterally; no wheezing, rhonchi or rales GI: soft, nontender  MS: no deformity or atrophy Skin: warm and dry  Neuro:  no focal deficits  Psych: Alert and oriented x 3, normal affect  Wt Readings from Last 3 Encounters:  04/09/15 188 lb (85.276 kg)  10/31/13 171 lb 14.4 oz (77.973 kg)  09/07/12 182 lb 6.4 oz (82.736 kg)      Studies/Labs  Reviewed:     EKG:  EKG is   ordered today.  The ekg ordered today demonstrates ectopic atrial rhythm with different P-wave morphologies, HR 81, normal axis, QTc 455 ms, no significant change when compared to prior tracings  Recent Labs: No results found for requested labs within last 365 days.  04/06/15 (at PCP office): Creatinine 0.79, ALT 22 12/16 (at PCP office):  TC 131, TG 63, HDL 46, LDL 72, A1c 6.1, TSH 1.026  Recent Lipid Panel    Component Value Date/Time   CHOL 135 07/19/2012 1651   TRIG 86 07/19/2012 1651   HDL 43 07/19/2012 1651   CHOLHDL 3.1 07/19/2012 1651   VLDL 17 07/19/2012 1651   LDLCALC 75 07/19/2012 1651    Additional studies/ records that were reviewed today include:   Echo 7/14 Mild LVH, basal septal hypertrophy, EF 60-65%, no RWMA, Gr 2 DD, massive LAE, PASP 45 mmHg, mild RAE, mild to mod TR   ASSESSMENT:     1. Atrial arrhythmia   2. Bilateral edema of lower extremity   3. Essential hypertension      PLAN:     In order of problems listed above:  1. Atrial arrhythmia - Her ECG continues to display ectopic atrial rhythm.  Apparently her ECG was abnormal yesterday at her PCPs office. We will request a copy. There has been concern for atrial fibrillation the past. She has a very large left age and. She admits to snoring. She also admits to daytime hypersomnolence. Blood pressure is elevated.  -  Increase metoprolol tartrate to 25 mg twice a day  -  Arrange 24-hour Holter  -  Obtain recent ECG from primary care  -  I suggested a sleep study.  She prefers to hold off for now. This should be considered again in the future.  2. LE edema - She has a history of venous insufficiency. She feels that her edema is worse. She also notes some mild dyspnea. She has a large A wave on exam which is likely related to the variations in her atrial beats. She also has a history of mild to moderate tricuspid regurgitation. Her weights are unreliable. She does not take her  diuretic on a regular basis. I have asked her to increase her torsemide to every other day instead of as needed. She will follow-up in one week with a BMET, BNP. If her BNP is significantly elevated, consider repeat echocardiogram.  3. Hypertension - Her blood pressure is elevated today. It was elevated at her primary care physician's office. As noted, she is asked to take torsemide every other day instead of when necessary. We will also increase her metoprolol. I have asked her to check her blood pressure daily and bring a list of her readings in in one week for review.   Medication Adjustments/Labs and Tests Ordered: Current medicines are reviewed at length with the patient today.  Concerns regarding medicines are outlined above.  Medication changes, Labs and Tests ordered today are outlined in the Patient Instructions noted below. Patient Instructions  Medication Instructions:  1. INCREASE METOPROLOL TARTRATE TO 25 MG 1 TABLET TWICE DAILY; NEW RX SENT IN TODAY WITH THE NEW DIRECTIONS 2. TAKE TORSEMIDE 10 MG EVERY OTHER DAY  Labwork: YOU WILL NEED TO HAVE 1 WEEK FOR BMET, BNP  Testing/Procedures: 1. Your physician has recommended that you wear a holter monitor. Holter monitors are medical devices that record the heart's electrical activity. Doctors most often use these monitors to diagnose arrhythmias. Arrhythmias are problems with the speed or rhythm of the heartbeat. The monitor is a small, portable device. You can wear one while you do your normal daily activities. This is usually used to diagnose what is causing palpitations/syncope (passing out).  Follow-Up: DR. HARDING IN 4-6 WEEKS   Any Other Special Instructions Will Be Listed Below (If Applicable). YOU WILL NEED TO MONITOR YOUR BLOOD  PRESSURE ONCE A DAY AND PLEASE BRING READINGS IN NEXT WEEK WHEN YOU COME IN FOR YOUR LAB WORK FOR [facility]on Willcutt, [facility]   If you need a refill on your cardiac medications before your next appointment,  please call your pharmacy.    Signed, Richardson Dopp, PA-C  04/09/2015 9:00 AM    [facility] [place], [place],   [address] Phone: [phone]; Fax: [phone]

## 2015-04-09 ENCOUNTER — Ambulatory Visit (INDEPENDENT_AMBULATORY_CARE_PROVIDER_SITE_OTHER): Payer: PPO | Admitting: Physician Assistant

## 2015-04-09 ENCOUNTER — Encounter: Payer: Self-pay | Admitting: Physician Assistant

## 2015-04-09 VITALS — BP 180/50 | HR 82 | Ht 64.0 in | Wt 188.0 lb

## 2015-04-09 DIAGNOSIS — I10 Essential (primary) hypertension: Secondary | ICD-10-CM

## 2015-04-09 DIAGNOSIS — I498 Other specified cardiac arrhythmias: Secondary | ICD-10-CM

## 2015-04-09 DIAGNOSIS — I471 Supraventricular tachycardia: Secondary | ICD-10-CM

## 2015-04-09 DIAGNOSIS — I499 Cardiac arrhythmia, unspecified: Secondary | ICD-10-CM | POA: Diagnosis not present

## 2015-04-09 DIAGNOSIS — R600 Localized edema: Secondary | ICD-10-CM

## 2015-04-09 MED ORDER — METOPROLOL TARTRATE 25 MG PO TABS: 25.0000 mg | ORAL_TABLET | Freq: Two times a day (BID) | ORAL | Status: DC

## 2015-04-09 NOTE — Patient Instructions (Addendum)
Medication Instructions:  1. INCREASE METOPROLOL TARTRATE TO 25 MG 1 TABLET TWICE DAILY; NEW RX SENT IN TODAY WITH THE NEW DIRECTIONS 2. TAKE TORSEMIDE 10 MG EVERY OTHER DAY  Labwork: YOU WILL NEED TO HAVE 1 WEEK FOR BMET, BNP  Testing/Procedures: 1. Your physician has recommended that you wear a holter monitor. Holter monitors are medical devices that record the heart's electrical activity. Doctors most often use these monitors to diagnose arrhythmias. Arrhythmias are problems with the speed or rhythm of the heartbeat. The monitor is a small, portable device. You can wear one while you do your normal daily activities. This is usually used to diagnose what is causing palpitations/syncope (passing out).  Follow-Up: DR. HARDING IN 4-6 WEEKS   Any Other Special Instructions Will Be Listed Below (If Applicable). YOU WILL NEED TO MONITOR YOUR BLOOD PRESSURE ONCE A DAY AND PLEASE BRING READINGS IN NEXT WEEK WHEN YOU COME IN FOR YOUR LAB WORK FOR [facility], [facility]   If you need a refill on your cardiac medications before your next appointment, please call your pharmacy.

## 2015-04-16 ENCOUNTER — Other Ambulatory Visit: Payer: PPO

## 2015-04-16 ENCOUNTER — Encounter: Payer: Self-pay | Admitting: *Deleted

## 2015-04-16 NOTE — Progress Notes (Signed)
Patient ID: Shirley [name], female   DOB: January 14, 1941, 75 y.o.   MRN: [id number] Patient did not show up for 04/16/2015, 11:00 AM, appointment to have a 24 hour holter monitor applied.

## 2015-04-23 DIAGNOSIS — M4120 Other idiopathic scoliosis, site unspecified: Secondary | ICD-10-CM | POA: Diagnosis not present

## 2015-04-23 DIAGNOSIS — G894 Chronic pain syndrome: Secondary | ICD-10-CM | POA: Diagnosis not present

## 2015-04-24 ENCOUNTER — Other Ambulatory Visit: Payer: Self-pay | Admitting: Orthopaedic Surgery

## 2015-04-24 ENCOUNTER — Other Ambulatory Visit: Payer: PPO

## 2015-04-24 DIAGNOSIS — M4120 Other idiopathic scoliosis, site unspecified: Secondary | ICD-10-CM

## 2015-04-30 ENCOUNTER — Ambulatory Visit
Admission: RE | Admit: 2015-04-30 | Discharge: 2015-04-30 | Disposition: A | Discharge status: Home / Self Care | Payer: PPO | Source: Ambulatory Visit | Attending: Orthopaedic Surgery | Admitting: Orthopaedic Surgery

## 2015-04-30 DIAGNOSIS — M4326 Fusion of spine, lumbar region: Secondary | ICD-10-CM | POA: Diagnosis not present

## 2015-04-30 DIAGNOSIS — M4120 Other idiopathic scoliosis, site unspecified: Secondary | ICD-10-CM

## 2015-04-30 DIAGNOSIS — M4804 Spinal stenosis, thoracic region: Secondary | ICD-10-CM | POA: Diagnosis not present

## 2015-05-13 DIAGNOSIS — M25561 Pain in right knee: Secondary | ICD-10-CM | POA: Diagnosis not present

## 2015-05-13 DIAGNOSIS — G8929 Other chronic pain: Secondary | ICD-10-CM | POA: Diagnosis not present

## 2015-05-13 DIAGNOSIS — M961 Postlaminectomy syndrome, not elsewhere classified: Secondary | ICD-10-CM | POA: Diagnosis not present

## 2015-05-13 DIAGNOSIS — M2550 Pain in unspecified joint: Secondary | ICD-10-CM | POA: Diagnosis not present

## 2015-05-13 DIAGNOSIS — M4120 Other idiopathic scoliosis, site unspecified: Secondary | ICD-10-CM | POA: Diagnosis not present

## 2015-05-13 DIAGNOSIS — R41840 Attention and concentration deficit: Secondary | ICD-10-CM | POA: Diagnosis not present

## 2015-05-13 DIAGNOSIS — F1190 Opioid use, unspecified, uncomplicated: Secondary | ICD-10-CM | POA: Diagnosis not present

## 2015-05-13 DIAGNOSIS — M25562 Pain in left knee: Secondary | ICD-10-CM | POA: Diagnosis not present

## 2015-05-13 DIAGNOSIS — M5416 Radiculopathy, lumbar region: Secondary | ICD-10-CM | POA: Diagnosis not present

## 2015-05-22 DIAGNOSIS — Z6829 Body mass index (BMI) 29.0-29.9, adult: Secondary | ICD-10-CM | POA: Diagnosis not present

## 2015-05-22 DIAGNOSIS — M4120 Other idiopathic scoliosis, site unspecified: Secondary | ICD-10-CM | POA: Diagnosis not present

## 2015-05-22 DIAGNOSIS — G894 Chronic pain syndrome: Secondary | ICD-10-CM | POA: Diagnosis not present

## 2015-05-22 DIAGNOSIS — M810 Age-related osteoporosis without current pathological fracture: Secondary | ICD-10-CM | POA: Diagnosis not present

## 2015-05-23 ENCOUNTER — Other Ambulatory Visit: Payer: Self-pay | Admitting: Orthopaedic Surgery

## 2015-05-23 DIAGNOSIS — M8580 Other specified disorders of bone density and structure, unspecified site: Secondary | ICD-10-CM

## 2015-05-30 ENCOUNTER — Telehealth: Payer: Self-pay | Admitting: Physician Assistant

## 2015-05-30 NOTE — Telephone Encounter (Signed)
-----   Message from Jeanie Sewer sent at 05/29/2015 11:04 AM EDT ----- Regarding: heart monitor At this time pt does not want to get the monitor attached. I have removed it from the workque. Arbie Cookey if she decides to get it done in the future we will have to add new order.   Thanks  [employer]

## 2015-06-02 NOTE — Progress Notes (Signed)
PCP: Ardith Dark, PA-C  Clinic Note: Chief Complaint  Patient presents with  . Follow-up    no chest pain, no shortness of breath, has edema in legs, has pain or cramping in legs, no lightheadedness or dizziness, has fatigue  . Palpitations  . Leg Swelling    HPI: Shirley [name] is a 75 y.o. female with a PMH below who presents today for 2 month f/u - was seen by Mr. Richardson Dopp in February 2017. --> She had a fall and winter PCP. There was concern of irregular rhythm, therefore follow-up was arranged.  She denied any significant palpitations. --> Metoprolol was increased to 25 twice a day. 24-hour monitor was ordered. Sleep study was suggested. -- Did not wear monitor & decided not to f/u with Mr. Kathlen Mody.  She has long-standing history of atrial arrhythmia - suggested PAT with frequent PACs. Cardiac evaluation and past with an echo showed aortic sclerosis but no stenosis. Normal EF. She has varicose veins with venous stasis --> Dopplers did not show significant reflux that weren't amenable for ablation due to tortuosity. She declined referral to renovascular.  Shirley [name] was last seen by me in September 2015 -- EKG suggested A. fib versus atrial tachycardia. She was noticed some irregular heartbeats apparently she had run out of her beta blocker. EKG suggested wandering atrial pacemaker. This would mean that rapid rate would be MAT.  Recent Hospitalizations: None  Studies Reviewed:   48-hour monitor - did not wear.   Interval History: Doing pretty well.  Only mild palpitations - no rapid irregular HR.  She still has some exertional dyspnea, but really is very limited as far as any activity she can do because of her back discomfort. She states that she recently had some x-ray scans that showed that she had cracked rib as well as a bone in her coccyx. Probably all related to osteopenia/porosis. For the most part, cardiology standpoint, she denies any active symptoms of chest pain  or shortness of breath with rest or exertion. No PND, orthopnea or edema.  No lightheadedness, dizziness, weakness or syncope/near syncope - with the exception of some mild dizziness with turning her head a certain way or getting up too quickly. No TIA/amaurosis fugax symptoms. No melena, hematochezia, hematuria, or epstaxis. No claudication.  ROS: A comprehensive was performed. Review of Systems  Constitutional: Positive for malaise/fatigue (Lack of "get up and go "). Negative for fever and chills.  HENT: Positive for congestion.        Seasonal allergies  Respiratory: Positive for cough (With allergies). Negative for shortness of breath and wheezing.   Cardiovascular: Positive for palpitations and leg swelling.       Otherwise per history of present illness  Gastrointestinal: Negative for heartburn, abdominal pain, blood in stool and melena.  Genitourinary: Negative for dysuria and hematuria.  Musculoskeletal: Positive for back pain, joint pain and falls (Sometimes with loss of balance).  Neurological: Positive for dizziness. Negative for tingling, sensory change, speech change, focal weakness and loss of consciousness.       Having balance issues  Psychiatric/Behavioral: Negative for depression and memory loss. The patient is not nervous/anxious.    Constitutional: Negative for fever and chills.  HENT: Positive for congestion. Negative for nosebleeds.   Seasonal allergies  Respiratory: Negative for cough, hemoptysis, sputum production, shortness of breath and wheezing.  Cardiovascular: Positive for palpitations and leg swelling.   Also noted in hands. Otherwise Noted in history of present illness  Gastrointestinal:  Negative for blood in stool and melena.  Genitourinary: Negative for hematuria.  Neurological: Negative for dizziness, sensory change, speech change, focal weakness, seizures and loss of consciousness.  Endo/Heme/Allergies: Does not bruise/bleed easily.    Psychiatric/Behavioral: Negative for depression. The patient is nervous/anxious. The patient does not have insomnia.   Husband has recently been in and out of hospital. Had 2 strokes   Past Medical History  Diagnosis Date  . Edema   . Vision problem     wears glasses  . Chronic pain   . Depression   . BV (bacterial vaginosis)     history of frequent  . Allergy     vasomotor rhinitis  . Asthma     h/o asthma and bronchitis  . GERD (gastroesophageal reflux disease)     h/o  . Varicose veins   . Osteopenia     mild at hip (T-1.3)  . Cervical spondylosis     herniated disk protruding @ C6-7, impinging cord and left axillary root sleeve.  . Vitamin D deficiency   . Fibromyalgia     sees Dr.Phillips-pain management  . DVT of upper extremity (deep vein thrombosis) ([place])     h/o left(after wrist fracture/surgery); no residual thrombus or phlebitis by Dopplers July 2014  . Varicose veins of both legs with edema 08/2012    Also pain; bilateral GSV dilated with reflux, R Short SV dilated with reflux; not amenable to VNUS ablation due to tortuosity and superficial nature of veins -- recommeded referral to Dr. Eilleen Kempf @ [facility]    Past Surgical History  Procedure Laterality Date  . Appendectomy    . Tonsillectomy and adenoidectomy    . Back surgery  age 70    ruptured disk L3-4   . Neck surgery  2007  . Knee surgery      right knee replacement  . Shoulder surgery      x8 (4 on rt side and 4 on left side)  . Wrist surgery      2 surgeries left wrist after fracture(complicated by DVT)  . Foot surgery      multiple(at least 4 on right, 5 on left)-left Dr.Kerner([place])11/08,left Dr.Nunley-excision 2nd and 3rd mt heads w/artho and hammertoe surgery 4th and 5th 04/2006. left great toe IP fusion and revision of fusion of 2nd through 5th toes 12/2005. Right foot surgeries  Dr.Bednarz 04/2008 and 04/2009.  . Abdominal hysterectomy  1999    BSO for endometriosis  .  Cataract extraction  2000    B/L  . Refractive surgery  2007    left eye  . Finger surgery      left finger for tender rupture from fall.  Marland Kitchen Spinal fusion  6/09    Dr.Cohen  . Doppler echocardiography  July 2012    Normal EF 60-65% , mild concentric LVH. Grade 2 diastolic dysfunction with elevated EDP. Massive left atrial dilation. Pulmonary pressures 30-40 mm Hg; aortic sclerosis but no stenosis. Moderate TR    Prior to Admission medications   Medication Sig Start Date End Date Taking? Authorizing Provider  ALPRAZolam Duanne Moron) 1 MG tablet Take 1 mg by mouth at bedtime as needed for anxiety.   Yes Historical Provider, MD  amphetamine-dextroamphetamine (ADDERALL) 20 MG tablet Take 20 mg by mouth daily. 05/13/15 06/12/15 Yes Historical Provider, MD  cetirizine (ZYRTEC) 10 MG tablet Take 10 mg by mouth daily.  06/24/14  Yes Historical Provider, MD  Cholecalciferol (VITAMIN D3) 5000 UNITS CAPS Take 5,000  Units by mouth 2 (two) times daily.    Yes Historical Provider, MD  DENTA 5000 PLUS 1.1 % CREA dental cream PLACE ON BRUSH AND BRUSH TWICE DAILY FOR 2 MINUTES TO PREVENT TOOTH DECAY AND SENSITIVITY 01/09/15  Yes Historical Provider, MD  FLUoxetine (PROZAC) 40 MG capsule Take 40 mg by mouth daily.   Yes Historical Provider, MD  gabapentin (NEURONTIN) 300 MG capsule Take 300 mg by mouth 4 (four) times daily.    Yes Historical Provider, MD  meloxicam (MOBIC) 15 MG tablet Take 15 mg by mouth daily. 05/14/15  Yes Historical Provider, MD  metoprolol tartrate (LOPRESSOR) 25 MG tablet Take 1 tablet (25 mg total) by mouth 2 (two) times daily. 04/09/15  Yes Liliane Shi, PA-C  oxycodone (ROXICODONE) 30 MG immediate release tablet Take 30 mg by mouth every 4 (four) hours as needed for pain (for pain). Reported on 06/03/2015   Yes Historical Provider, MD  torsemide (DEMADEX) 10 MG tablet Take 10 mg by mouth every other day. 01/13/15  Yes Historical Provider, MD   Allergies  Allergen Reactions  . No Known  Allergies     Social History   Social History  . Marital Status: Married    Spouse Name: Hagerman  . Number of Children: N/A  . Years of Education: N/A   Social History Main Topics  . Smoking status: Current Some Day Smoker    Last Attempt to Quit: 02/14/1957  . Smokeless tobacco: Never Used     Comment: + h/o passive exposure (second hand) from family, husband (none currently)  . Alcohol Use: No  . Drug Use: No  . Sexual Activity: Not Asked   Other Topics Concern  . None   Social History Narrative   Lives with husband.  3 children ([place], [place], [place]), 5 grandchildren   Family History  Problem Relation Age of Onset  . Stroke Mother   . Osteoarthritis Mother     Died at age 91  . Heart disease Father   . Lung cancer Father   . Osteoporosis Sister   . Diabetes Brother   . Breast cancer Paternal Aunt   . Heart attack Brother 77  . Diabetes Brother   . Diabetes Brother   . Heart attack Brother 18  . Heart failure Father     Died at age 36  . Heart failure Maternal Grandmother     Died at age 10, unsure of her heart disease  . Heart failure Paternal Grandfather     Died at age 43, unsure of cardiac disease.    Wt Readings from Last 3 Encounters:  06/03/15 179 lb 4 oz (81.307 kg)  04/09/15 188 lb (85.276 kg)  10/31/13 171 lb 14.4 oz (77.973 kg)    PHYSICAL EXAM BP 142/76 mmHg  Pulse 68  Ht 5\' 4"  (1.626 m)  Wt 179 lb 4 oz (81.307 kg)  BMI 30.75 kg/m2 General appearance: alert, cooperative, appears stated age, no distress and mildy obese -- because of her kyphoscoliosis and back pain, she is sitting in a very contorted position on the table. She does appear to be in somewhat amount of discomfort, but no distress. Neck: no adenopathy, no carotid bruit and no JVD Lungs: clear to auscultation bilaterally, normal percussion bilaterally and non-labored Heart: regular rate and rhythm, S1 and S2 normal, no murmur, click, rub or gallop. Abdomen: soft,  non-tender; bowel sounds normal; no masses,  no organomegaly; no HSM Extremities: extremities normal, atraumatic, no cyanosis, and edema  that is roughly 2+ to the knee bilaterally. Mild venous stasis changes but no obvious varicose veins noted. Pulses: 2+ and symmetric;  Skin: no abnormal pigmentation and Venous stasis dermatitis  Neurologic: Mental status: Alert, oriented, thought content appropriate Cranial nerves: normal (II-XII grossly intact)    Adult ECG Report  Rate: 69 ;  Rhythm: normal sinus rhythm and With sinus arrhythmia/PACs and 1 A-V block (18 ms). Otherwise normal axis, intervals and durations.;   Narrative Interpretation: Stable EKG   Other studies Reviewed: Additional studies/ records that were reviewed today include:  Recent Labs:  Followed by PCP   ASSESSMENT / PLAN: Problem List Items Addressed This Visit    Varicose veins of both lower extremities with pain (Chronic)    No interest in invasive procedures/treatments for her veins. Recommend wearing compression stockings and taking torsemide more frequently than every other day. Perhaps 4-5 days a week      PAT (paroxysmal atrial tachycardia) / MAT (Chronic)    She actually chose not to wear the monitor, citing that she hasn't had a lot of palpitations since seeing Mr. Kathlen Mody. In use on her current dose of Lopressor, stating that that may have made it more controlled.      Relevant Orders   EKG 12-Lead   Exertional dyspnea - Primary    Probably related to deconditioning and significant musculoskeletal symptoms. As a result of her back pain to her mid mobility is very limited and therefore she is quite deconditioned.      Relevant Orders   EKG 12-Lead   Abnormal resting ECG findings - sinus rhythm with PACs, and PAT (Chronic)    Pretty similar looking EKG to prior EKG. No evidence of ischemia.      Relevant Orders   EKG 12-Lead      Current medicines are reviewed at length with the patient today. (+/-  concerns) n/a The following changes have been made:  START WEARING YOUR SUPPORT STOCKING DAILY AND TAKE OFF AT NIGHT  TAKE TORSEMIDE TABLET( FLUID PILL) DAILY .   Your physician wants you to follow-up in [place].   Studies Ordered:   Orders Placed This Encounter  Procedures  . EKG 12-Lead      Leonie Man, M.D., M.S. Interventional Cardiologist   Pager # [phone] Phone # [phone] [address]. [place], [place] [address]

## 2015-06-03 ENCOUNTER — Ambulatory Visit (INDEPENDENT_AMBULATORY_CARE_PROVIDER_SITE_OTHER): Payer: PPO | Admitting: Cardiology

## 2015-06-03 ENCOUNTER — Encounter: Payer: Self-pay | Admitting: Cardiology

## 2015-06-03 VITALS — BP 142/76 | HR 68 | Ht 64.0 in | Wt 179.2 lb

## 2015-06-03 DIAGNOSIS — I83813 Varicose veins of bilateral lower extremities with pain: Secondary | ICD-10-CM | POA: Diagnosis not present

## 2015-06-03 DIAGNOSIS — R0609 Other forms of dyspnea: Secondary | ICD-10-CM

## 2015-06-03 DIAGNOSIS — R9431 Abnormal electrocardiogram [ECG] [EKG]: Secondary | ICD-10-CM | POA: Diagnosis not present

## 2015-06-03 DIAGNOSIS — I471 Supraventricular tachycardia: Secondary | ICD-10-CM

## 2015-06-03 DIAGNOSIS — R600 Localized edema: Secondary | ICD-10-CM

## 2015-06-03 NOTE — Assessment & Plan Note (Signed)
No interest in invasive procedures/treatments for her veins. Recommend wearing compression stockings and taking torsemide more frequently than every other day. Perhaps 4-5 days a week

## 2015-06-03 NOTE — Patient Instructions (Signed)
START WEARING YOUR SUPPORT STOCKING DAILY AND TAKE OFF AT NIGHT  TAKE TORSEMIDE TABLET( FLUID PILL) DAILY .  Your physician wants you to follow-up in [place].  You will receive a reminder letter in the mail two months in advance. If you don't receive a letter, please call our office to schedule the follow-up appointment.  If you need a refill on your cardiac medications before your next appointment, please call your pharmacy.

## 2015-06-03 NOTE — Assessment & Plan Note (Signed)
Pretty similar looking EKG to prior EKG. No evidence of ischemia.

## 2015-06-03 NOTE — Assessment & Plan Note (Signed)
She actually chose not to wear the monitor, citing that she hasn't had a lot of palpitations since seeing Mr. Kathlen Mody. In use on her current dose of Lopressor, stating that that may have made it more controlled.

## 2015-06-03 NOTE — Assessment & Plan Note (Signed)
Probably related to deconditioning and significant musculoskeletal symptoms. As a result of her back pain to her mid mobility is very limited and therefore she is quite deconditioned.

## 2015-06-16 ENCOUNTER — Ambulatory Visit
Admission: RE | Admit: 2015-06-16 | Discharge: 2015-06-16 | Disposition: A | Discharge status: Home / Self Care | Payer: PPO | Source: Ambulatory Visit | Attending: Orthopaedic Surgery | Admitting: Orthopaedic Surgery

## 2015-06-16 ENCOUNTER — Other Ambulatory Visit: Payer: Self-pay

## 2015-06-16 DIAGNOSIS — M85852 Other specified disorders of bone density and structure, left thigh: Secondary | ICD-10-CM | POA: Diagnosis not present

## 2015-06-16 DIAGNOSIS — Z1231 Encounter for screening mammogram for malignant neoplasm of breast: Secondary | ICD-10-CM

## 2015-06-16 DIAGNOSIS — M8580 Other specified disorders of bone density and structure, unspecified site: Secondary | ICD-10-CM

## 2015-06-16 DIAGNOSIS — Z780 Asymptomatic menopausal state: Secondary | ICD-10-CM | POA: Diagnosis not present

## 2015-06-25 DIAGNOSIS — G894 Chronic pain syndrome: Secondary | ICD-10-CM | POA: Diagnosis not present

## 2015-06-25 DIAGNOSIS — M4120 Other idiopathic scoliosis, site unspecified: Secondary | ICD-10-CM | POA: Diagnosis not present

## 2015-06-26 DIAGNOSIS — Z7409 Other reduced mobility: Secondary | ICD-10-CM | POA: Diagnosis not present

## 2015-06-30 ENCOUNTER — Ambulatory Visit: Payer: PPO

## 2015-07-01 DIAGNOSIS — Z7409 Other reduced mobility: Secondary | ICD-10-CM | POA: Diagnosis not present

## 2015-07-03 DIAGNOSIS — Z7409 Other reduced mobility: Secondary | ICD-10-CM | POA: Diagnosis not present

## 2015-07-07 DIAGNOSIS — Z7409 Other reduced mobility: Secondary | ICD-10-CM | POA: Diagnosis not present

## 2015-07-09 DIAGNOSIS — G894 Chronic pain syndrome: Secondary | ICD-10-CM | POA: Diagnosis not present

## 2015-07-09 DIAGNOSIS — M4120 Other idiopathic scoliosis, site unspecified: Secondary | ICD-10-CM | POA: Diagnosis not present

## 2015-07-14 DIAGNOSIS — Z7409 Other reduced mobility: Secondary | ICD-10-CM | POA: Diagnosis not present

## 2015-07-22 DIAGNOSIS — Z7409 Other reduced mobility: Secondary | ICD-10-CM | POA: Diagnosis not present

## 2015-07-30 DIAGNOSIS — Z7409 Other reduced mobility: Secondary | ICD-10-CM | POA: Diagnosis not present

## 2015-08-05 DIAGNOSIS — Z7409 Other reduced mobility: Secondary | ICD-10-CM | POA: Diagnosis not present

## 2015-08-06 DIAGNOSIS — M4120 Other idiopathic scoliosis, site unspecified: Secondary | ICD-10-CM | POA: Diagnosis not present

## 2015-08-06 DIAGNOSIS — M791 Myalgia: Secondary | ICD-10-CM | POA: Diagnosis not present

## 2015-08-06 DIAGNOSIS — G894 Chronic pain syndrome: Secondary | ICD-10-CM | POA: Diagnosis not present

## 2015-08-12 DIAGNOSIS — M2550 Pain in unspecified joint: Secondary | ICD-10-CM | POA: Diagnosis not present

## 2015-08-12 DIAGNOSIS — M961 Postlaminectomy syndrome, not elsewhere classified: Secondary | ICD-10-CM | POA: Diagnosis not present

## 2015-08-12 DIAGNOSIS — Z0000 Encounter for general adult medical examination without abnormal findings: Secondary | ICD-10-CM | POA: Diagnosis not present

## 2015-08-12 DIAGNOSIS — G8929 Other chronic pain: Secondary | ICD-10-CM | POA: Diagnosis not present

## 2015-08-12 DIAGNOSIS — R41840 Attention and concentration deficit: Secondary | ICD-10-CM | POA: Diagnosis not present

## 2015-09-16 ENCOUNTER — Ambulatory Visit: Payer: PPO

## 2015-10-06 DIAGNOSIS — N898 Other specified noninflammatory disorders of vagina: Secondary | ICD-10-CM | POA: Diagnosis not present

## 2015-10-06 DIAGNOSIS — N760 Acute vaginitis: Secondary | ICD-10-CM | POA: Diagnosis not present

## 2015-10-07 DIAGNOSIS — Z803 Family history of malignant neoplasm of breast: Secondary | ICD-10-CM | POA: Diagnosis not present

## 2015-10-07 DIAGNOSIS — Z1231 Encounter for screening mammogram for malignant neoplasm of breast: Secondary | ICD-10-CM | POA: Diagnosis not present

## 2015-10-15 DIAGNOSIS — Z961 Presence of intraocular lens: Secondary | ICD-10-CM | POA: Diagnosis not present

## 2015-10-15 DIAGNOSIS — Z0100 Encounter for examination of eyes and vision without abnormal findings: Secondary | ICD-10-CM | POA: Diagnosis not present

## 2015-11-11 DIAGNOSIS — M961 Postlaminectomy syndrome, not elsewhere classified: Secondary | ICD-10-CM | POA: Diagnosis not present

## 2015-11-11 DIAGNOSIS — Z23 Encounter for immunization: Secondary | ICD-10-CM | POA: Diagnosis not present

## 2015-11-11 DIAGNOSIS — R41840 Attention and concentration deficit: Secondary | ICD-10-CM | POA: Diagnosis not present

## 2015-11-11 DIAGNOSIS — M4120 Other idiopathic scoliosis, site unspecified: Secondary | ICD-10-CM | POA: Diagnosis not present

## 2015-11-11 DIAGNOSIS — M2550 Pain in unspecified joint: Secondary | ICD-10-CM | POA: Diagnosis not present

## 2015-11-11 DIAGNOSIS — R609 Edema, unspecified: Secondary | ICD-10-CM | POA: Diagnosis not present

## 2015-11-11 DIAGNOSIS — I872 Venous insufficiency (chronic) (peripheral): Secondary | ICD-10-CM | POA: Diagnosis not present

## 2015-11-11 DIAGNOSIS — M5416 Radiculopathy, lumbar region: Secondary | ICD-10-CM | POA: Diagnosis not present

## 2015-11-11 DIAGNOSIS — G8929 Other chronic pain: Secondary | ICD-10-CM | POA: Diagnosis not present

## 2015-11-25 DIAGNOSIS — M961 Postlaminectomy syndrome, not elsewhere classified: Secondary | ICD-10-CM | POA: Diagnosis not present

## 2015-11-25 DIAGNOSIS — M5416 Radiculopathy, lumbar region: Secondary | ICD-10-CM | POA: Diagnosis not present

## 2015-11-27 DIAGNOSIS — M4120 Other idiopathic scoliosis, site unspecified: Secondary | ICD-10-CM | POA: Diagnosis not present

## 2015-11-27 DIAGNOSIS — M810 Age-related osteoporosis without current pathological fracture: Secondary | ICD-10-CM | POA: Diagnosis not present

## 2015-11-27 DIAGNOSIS — G894 Chronic pain syndrome: Secondary | ICD-10-CM | POA: Diagnosis not present

## 2015-11-27 DIAGNOSIS — M961 Postlaminectomy syndrome, not elsewhere classified: Secondary | ICD-10-CM | POA: Diagnosis not present

## 2015-11-27 DIAGNOSIS — M7062 Trochanteric bursitis, left hip: Secondary | ICD-10-CM | POA: Diagnosis not present

## 2015-12-05 DIAGNOSIS — M7062 Trochanteric bursitis, left hip: Secondary | ICD-10-CM | POA: Diagnosis not present

## 2015-12-05 DIAGNOSIS — M961 Postlaminectomy syndrome, not elsewhere classified: Secondary | ICD-10-CM | POA: Diagnosis not present

## 2015-12-05 DIAGNOSIS — M4120 Other idiopathic scoliosis, site unspecified: Secondary | ICD-10-CM | POA: Diagnosis not present

## 2015-12-10 ENCOUNTER — Other Ambulatory Visit: Payer: Self-pay | Admitting: Orthopaedic Surgery

## 2015-12-10 DIAGNOSIS — M961 Postlaminectomy syndrome, not elsewhere classified: Secondary | ICD-10-CM

## 2015-12-15 ENCOUNTER — Ambulatory Visit
Admission: RE | Admit: 2015-12-15 | Discharge: 2015-12-15 | Disposition: A | Discharge status: Home / Self Care | Payer: PPO | Source: Ambulatory Visit | Attending: Orthopaedic Surgery | Admitting: Orthopaedic Surgery

## 2015-12-15 DIAGNOSIS — R1030 Lower abdominal pain, unspecified: Secondary | ICD-10-CM | POA: Diagnosis not present

## 2015-12-15 DIAGNOSIS — M961 Postlaminectomy syndrome, not elsewhere classified: Secondary | ICD-10-CM

## 2015-12-19 ENCOUNTER — Other Ambulatory Visit: Payer: Self-pay | Admitting: Orthopaedic Surgery

## 2015-12-19 DIAGNOSIS — G894 Chronic pain syndrome: Secondary | ICD-10-CM | POA: Diagnosis not present

## 2015-12-19 DIAGNOSIS — M009 Pyogenic arthritis, unspecified: Secondary | ICD-10-CM | POA: Diagnosis not present

## 2015-12-19 DIAGNOSIS — M25552 Pain in left hip: Secondary | ICD-10-CM | POA: Diagnosis not present

## 2015-12-19 DIAGNOSIS — M546 Pain in thoracic spine: Secondary | ICD-10-CM | POA: Diagnosis not present

## 2015-12-19 DIAGNOSIS — M4120 Other idiopathic scoliosis, site unspecified: Secondary | ICD-10-CM | POA: Diagnosis not present

## 2015-12-20 ENCOUNTER — Ambulatory Visit
Admission: RE | Admit: 2015-12-20 | Discharge: 2015-12-20 | Disposition: A | Discharge status: Home / Self Care | Payer: PPO | Source: Ambulatory Visit | Attending: Orthopaedic Surgery | Admitting: Orthopaedic Surgery

## 2015-12-20 DIAGNOSIS — M25552 Pain in left hip: Secondary | ICD-10-CM

## 2015-12-22 ENCOUNTER — Emergency Department (HOSPITAL_COMMUNITY): Payer: PPO

## 2015-12-22 ENCOUNTER — Encounter (HOSPITAL_COMMUNITY): Payer: Self-pay | Admitting: Emergency Medicine

## 2015-12-22 ENCOUNTER — Inpatient Hospital Stay (HOSPITAL_COMMUNITY)
Admission: EM | Admit: 2015-12-22 | Discharge: 2015-12-25 | DRG: 565 | Disposition: A | Discharge status: Home Health | Payer: PPO | Attending: Internal Medicine | Admitting: Internal Medicine

## 2015-12-22 DIAGNOSIS — N390 Urinary tract infection, site not specified: Secondary | ICD-10-CM | POA: Diagnosis not present

## 2015-12-22 DIAGNOSIS — M8580 Other specified disorders of bone density and structure, unspecified site: Secondary | ICD-10-CM | POA: Diagnosis not present

## 2015-12-22 DIAGNOSIS — Z8262 Family history of osteoporosis: Secondary | ICD-10-CM | POA: Diagnosis not present

## 2015-12-22 DIAGNOSIS — Z9842 Cataract extraction status, left eye: Secondary | ICD-10-CM

## 2015-12-22 DIAGNOSIS — Z823 Family history of stroke: Secondary | ICD-10-CM | POA: Diagnosis not present

## 2015-12-22 DIAGNOSIS — Z8249 Family history of ischemic heart disease and other diseases of the circulatory system: Secondary | ICD-10-CM

## 2015-12-22 DIAGNOSIS — F17200 Nicotine dependence, unspecified, uncomplicated: Secondary | ICD-10-CM | POA: Diagnosis present

## 2015-12-22 DIAGNOSIS — G894 Chronic pain syndrome: Secondary | ICD-10-CM | POA: Diagnosis not present

## 2015-12-22 DIAGNOSIS — M25552 Pain in left hip: Secondary | ICD-10-CM | POA: Diagnosis present

## 2015-12-22 DIAGNOSIS — Z6829 Body mass index (BMI) 29.0-29.9, adult: Secondary | ICD-10-CM | POA: Diagnosis not present

## 2015-12-22 DIAGNOSIS — Z791 Long term (current) use of non-steroidal anti-inflammatories (NSAID): Secondary | ICD-10-CM

## 2015-12-22 DIAGNOSIS — R262 Difficulty in walking, not elsewhere classified: Secondary | ICD-10-CM

## 2015-12-22 DIAGNOSIS — M797 Fibromyalgia: Secondary | ICD-10-CM | POA: Diagnosis not present

## 2015-12-22 DIAGNOSIS — G8929 Other chronic pain: Secondary | ICD-10-CM | POA: Diagnosis not present

## 2015-12-22 DIAGNOSIS — M25452 Effusion, left hip: Principal | ICD-10-CM | POA: Diagnosis present

## 2015-12-22 DIAGNOSIS — Z79899 Other long term (current) drug therapy: Secondary | ICD-10-CM | POA: Diagnosis not present

## 2015-12-22 DIAGNOSIS — M899 Disorder of bone, unspecified: Secondary | ICD-10-CM | POA: Diagnosis not present

## 2015-12-22 DIAGNOSIS — Z96651 Presence of right artificial knee joint: Secondary | ICD-10-CM | POA: Diagnosis present

## 2015-12-22 DIAGNOSIS — F329 Major depressive disorder, single episode, unspecified: Secondary | ICD-10-CM | POA: Diagnosis not present

## 2015-12-22 DIAGNOSIS — N3000 Acute cystitis without hematuria: Secondary | ICD-10-CM | POA: Diagnosis not present

## 2015-12-22 DIAGNOSIS — Z803 Family history of malignant neoplasm of breast: Secondary | ICD-10-CM | POA: Diagnosis not present

## 2015-12-22 DIAGNOSIS — E559 Vitamin D deficiency, unspecified: Secondary | ICD-10-CM | POA: Diagnosis present

## 2015-12-22 DIAGNOSIS — I878 Other specified disorders of veins: Secondary | ICD-10-CM | POA: Diagnosis present

## 2015-12-22 DIAGNOSIS — Z7189 Other specified counseling: Secondary | ICD-10-CM | POA: Diagnosis not present

## 2015-12-22 DIAGNOSIS — F17210 Nicotine dependence, cigarettes, uncomplicated: Secondary | ICD-10-CM | POA: Diagnosis not present

## 2015-12-22 DIAGNOSIS — Z833 Family history of diabetes mellitus: Secondary | ICD-10-CM | POA: Diagnosis not present

## 2015-12-22 DIAGNOSIS — Z515 Encounter for palliative care: Secondary | ICD-10-CM | POA: Diagnosis present

## 2015-12-22 DIAGNOSIS — Z801 Family history of malignant neoplasm of trachea, bronchus and lung: Secondary | ICD-10-CM | POA: Diagnosis not present

## 2015-12-22 DIAGNOSIS — Z981 Arthrodesis status: Secondary | ICD-10-CM

## 2015-12-22 DIAGNOSIS — Z9841 Cataract extraction status, right eye: Secondary | ICD-10-CM | POA: Diagnosis not present

## 2015-12-22 DIAGNOSIS — Z66 Do not resuscitate: Secondary | ICD-10-CM | POA: Diagnosis present

## 2015-12-22 LAB — CBC WITH DIFFERENTIAL/PLATELET
Basophils Absolute: 0 10*3/uL (ref 0.0–0.1)
Basophils Relative: 1 %
EOS ABS: 0.2 10*3/uL (ref 0.0–0.7)
Eosinophils Relative: 3 %
HEMATOCRIT: 32.4 % — AB (ref 36.0–46.0)
HEMOGLOBIN: 10 g/dL — AB (ref 12.0–15.0)
LYMPHS ABS: 1.2 10*3/uL (ref 0.7–4.0)
Lymphocytes Relative: 18 %
MCH: 28.5 pg (ref 26.0–34.0)
MCHC: 30.9 g/dL (ref 30.0–36.0)
MCV: 92.3 fL (ref 78.0–100.0)
MONOS PCT: 9 %
Monocytes Absolute: 0.6 10*3/uL (ref 0.1–1.0)
NEUTROS ABS: 4.6 10*3/uL (ref 1.7–7.7)
NEUTROS PCT: 69 %
Platelets: 627 10*3/uL — ABNORMAL HIGH (ref 150–400)
RBC: 3.51 MIL/uL — ABNORMAL LOW (ref 3.87–5.11)
RDW: 13.9 % (ref 11.5–15.5)
WBC: 6.6 10*3/uL (ref 4.0–10.5)

## 2015-12-22 LAB — BASIC METABOLIC PANEL
Anion gap: 7 (ref 5–15)
BUN: 32 mg/dL — AB (ref 6–20)
CHLORIDE: 108 mmol/L (ref 101–111)
CO2: 23 mmol/L (ref 22–32)
CREATININE: 1.22 mg/dL — AB (ref 0.44–1.00)
Calcium: 8.7 mg/dL — ABNORMAL LOW (ref 8.9–10.3)
GFR calc Af Amer: 48 mL/min — ABNORMAL LOW (ref >60)
GFR calc non Af Amer: 42 mL/min — ABNORMAL LOW (ref >60)
Glucose, Bld: 103 mg/dL — ABNORMAL HIGH (ref 65–99)
Potassium: 4.8 mmol/L (ref 3.5–5.1)
Sodium: 138 mmol/L (ref 135–145)

## 2015-12-22 LAB — URINALYSIS, ROUTINE W REFLEX MICROSCOPIC
Bilirubin Urine: NEGATIVE
Glucose, UA: NEGATIVE mg/dL
Hgb urine dipstick: NEGATIVE
KETONES UR: NEGATIVE mg/dL
NITRITE: POSITIVE — AB
PROTEIN: NEGATIVE mg/dL
Specific Gravity, Urine: 1.022 (ref 1.005–1.030)
pH: 6 (ref 5.0–8.0)

## 2015-12-22 LAB — C-REACTIVE PROTEIN: CRP: 1.2 mg/dL — ABNORMAL HIGH (ref <1.0)

## 2015-12-22 LAB — URINE MICROSCOPIC-ADD ON

## 2015-12-22 LAB — SEDIMENTATION RATE: Sed Rate: 35 mm/hr — ABNORMAL HIGH (ref 0–22)

## 2015-12-22 MED ORDER — OXYCODONE HCL 5 MG PO TABS
30.0000 mg | ORAL_TABLET | Freq: Once | ORAL | Status: AC
  Administered 2015-12-22: 30 mg via ORAL
  Filled 2015-12-22: qty 6

## 2015-12-22 MED ORDER — IOPAMIDOL (ISOVUE-M 200) INJECTION 41%
INTRAMUSCULAR | Status: AC
  Filled 2015-12-22: qty 10

## 2015-12-22 MED ORDER — LIDOCAINE HCL 1 % IJ SOLN
INTRAMUSCULAR | Status: AC
  Filled 2015-12-22: qty 10

## 2015-12-22 MED ORDER — LIDOCAINE HCL 1 % IJ SOLN
INTRAMUSCULAR | Status: AC
  Filled 2015-12-22: qty 20

## 2015-12-22 NOTE — ED Notes (Signed)
MD at bedside. 

## 2015-12-22 NOTE — ED Notes (Signed)
Bed: [facility]PT1 Expected date:  Expected time:  Means of arrival:  Comments: 

## 2015-12-22 NOTE — ED Triage Notes (Signed)
Pt brought in by daughter, states pt was sent by Dr. Patrice Paradise for possible septic arthritis. Pt c/o severe left hip pain x2 weeks. Patient's daughter reports pt needs sedated MRI, because pt was in too much pain on Saturday to get MRI at [place]. Pt A&Ox4. Hx orthopedic surgeries.

## 2015-12-22 NOTE — ED Provider Notes (Signed)
[facility] DEPT Provider Note   CSN: [id number] Arrival date & time: 12/22/15  1530     History   Chief Complaint Chief Complaint  Patient presents with  . Hip Pain    HPI Shirley [name] is a 75 y.o. female.  The history is provided by the patient.  Hip Pain  This is a new problem. Episode onset: 2 weeks. The problem occurs constantly. The problem has been gradually worsening. Pertinent negatives include no chest pain and no abdominal pain. The symptoms are aggravated by bending (movement). Nothing relieves the symptoms. Treatments tried: narcotics. The treatment provided no relief.    Past Medical History:  Diagnosis Date  . Allergy    vasomotor rhinitis  . Asthma    h/o asthma and bronchitis  . BV (bacterial vaginosis)    history of frequent  . Cervical spondylosis    herniated disk protruding @ C6-7, impinging cord and left axillary root sleeve.  . Chronic pain   . Depression   . DVT of upper extremity (deep vein thrombosis) ([place])    h/o left(after wrist fracture/surgery); no residual thrombus or phlebitis by Dopplers July 2014  . Edema   . Fibromyalgia    sees Dr.Phillips-pain management  . GERD (gastroesophageal reflux disease)    h/o  . Osteopenia    mild at hip (T-1.3)  . Varicose veins   . 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 Eilleen Kempf @ [facility]  . Vision problem    wears glasses  . Vitamin D deficiency     Patient Active Problem List   Diagnosis Date Noted  . Exertional dyspnea 09/07/2012  . Varicose veins of both lower extremities with pain 08/27/2012  . Lower extremity edema 08/27/2012  . Abnormal resting ECG findings - sinus rhythm with PACs, and PAT 08/19/2012  . Aortic systolic murmur on examination 08/19/2012  . PAT (paroxysmal  atrial tachycardia) / MAT 08/19/2012    Past Surgical History:  Procedure Laterality Date  . ABDOMINAL HYSTERECTOMY  1999   BSO for endometriosis  . APPENDECTOMY    . BACK SURGERY  age 27   ruptured disk L3-4   . CATARACT EXTRACTION  2000   B/L  . DOPPLER ECHOCARDIOGRAPHY  July 2012   Normal EF 60-65% , mild concentric LVH. Grade 2 diastolic dysfunction with elevated EDP. Massive left atrial dilation. Pulmonary pressures 30-40 mm Hg; aortic sclerosis but no stenosis. Moderate TR  . FINGER SURGERY     left finger for tender rupture from fall.  [facility] Kitchen FOOT SURGERY     multiple(at least 4 on right, 5 on left)-left Dr.Kerner([place])11/08,left Dr.Nunley-excision 2nd and 3rd mt heads w/artho and hammertoe surgery 4th and 5th 04/2006. left great toe IP fusion and revision of fusion of 2nd through 5th toes 12/2005. Right foot surgeries  Dr.Bednarz 04/2008 and 04/2009.  Marland Kitchen KNEE SURGERY     right knee replacement  . NECK SURGERY  2007  . REFRACTIVE SURGERY  2007   left eye  . SHOULDER SURGERY     x8 (4 on rt side and 4 on left side)  . SPINAL FUSION  6/09   Dr.Cohen  . TONSILLECTOMY AND ADENOIDECTOMY    . WRIST SURGERY     2 surgeries left wrist after fracture(complicated by DVT)  OB History    No data available       Home Medications    Prior to Admission medications   Medication Sig Start Date End Date Taking? Authorizing Provider  ALPRAZolam Duanne Moron) 1 MG tablet Take 1 mg by mouth at bedtime as needed for anxiety.    Historical Provider, MD  amphetamine-dextroamphetamine (ADDERALL) 20 MG tablet Take 20 mg by mouth daily. 05/13/15 06/12/15  Historical Provider, MD  cetirizine (ZYRTEC) 10 MG tablet Take 10 mg by mouth daily.  06/24/14   Historical Provider, MD  Cholecalciferol (VITAMIN D3) 5000 UNITS CAPS Take 5,000 Units by mouth 2 (two) times daily.     Historical Provider, MD  DENTA 5000 PLUS 1.1 % CREA dental cream PLACE ON BRUSH AND BRUSH TWICE DAILY FOR 2 MINUTES TO PREVENT TOOTH DECAY  AND SENSITIVITY 01/09/15   Historical Provider, MD  FLUoxetine (PROZAC) 40 MG capsule Take 40 mg by mouth daily.    Historical Provider, MD  gabapentin (NEURONTIN) 300 MG capsule Take 300 mg by mouth 4 (four) times daily.     Historical Provider, MD  meloxicam (MOBIC) 15 MG tablet Take 15 mg by mouth daily. 05/14/15   Historical Provider, MD  metoprolol tartrate (LOPRESSOR) 25 MG tablet Take 1 tablet (25 mg total) by mouth 2 (two) times daily. 04/09/15   Liliane Shi, PA-C  oxycodone (ROXICODONE) 30 MG immediate release tablet Take 30 mg by mouth every 4 (four) hours as needed for pain (for pain). Reported on 06/03/2015    Historical Provider, MD  torsemide (DEMADEX) 10 MG tablet Take 10 mg by mouth every other day. 01/13/15   Historical Provider, MD    Family History Family History  Problem Relation Age of Onset  . Stroke Mother   . Osteoarthritis Mother     Died at age 35  . Heart disease Father   . Lung cancer Father   . Heart failure Father     Died at age 7  . Osteoporosis Sister   . Diabetes Brother   . Heart attack Brother 57  . Diabetes Brother   . Diabetes Brother   . Heart attack Brother 82  . Breast cancer Paternal Aunt   . Heart failure Maternal Grandmother     Died at age 77, unsure of her heart disease  . Heart failure Paternal Grandfather     Died at age 48, unsure of cardiac disease.    Social History Social History  Substance Use Topics  . Smoking status: Current Some Day Smoker    Last attempt to quit: 02/14/1957  . Smokeless tobacco: Never Used     Comment: + h/o passive exposure (second hand) from family, husband (none currently)  . Alcohol use No     Allergies   Review of patient's allergies indicates no known allergies.   Review of Systems Review of Systems  Cardiovascular: Negative for chest pain.  Gastrointestinal: Negative for abdominal pain.  Musculoskeletal:       Bilateral leg swelling  All other systems reviewed and are  negative.    Physical Exam Updated Vital Signs BP 167/62   Pulse 115   Temp 98.4 F (36.9 C) (Oral)   Resp 17   Ht 5\' 4"  (1.626 m)   Wt 174 lb (78.9 kg)   SpO2 98%   BMI 29.87 kg/m   Physical Exam  Constitutional: She is oriented to person, place, and time. She appears well-developed and well-nourished. No distress.  HENT:  Head: Normocephalic.  Nose: Nose normal.  Eyes: Conjunctivae are normal.  Neck: Neck supple. No tracheal deviation present.  Cardiovascular: Normal rate and regular rhythm.   Pulmonary/Chest: Effort normal. No respiratory distress.  Abdominal: Soft. She exhibits no distension.  Musculoskeletal:  Left hip tenderness. Bilateral 3+ lower extremity edema with overlying erythema consistent with venous stasis dermatitis  Neurological: She is alert and oriented to person, place, and time.  Skin: Skin is warm and dry.  Psychiatric: She has a normal mood and affect.     ED Treatments / Results  Labs (all labs ordered are listed, but only abnormal results are displayed) Labs Reviewed  CBC WITH DIFFERENTIAL/PLATELET - Abnormal; Notable for the following:       Result Value   RBC 3.51 (*)    Hemoglobin 10.0 (*)    HCT 32.4 (*)    Platelets 627 (*)    All other components within normal limits  BASIC METABOLIC PANEL - Abnormal; Notable for the following:    Glucose, Bld 103 (*)    BUN 32 (*)    Creatinine, Ser 1.22 (*)    Calcium 8.7 (*)    GFR calc non Af Amer 42 (*)    GFR calc Af Amer 48 (*)    All other components within normal limits  SEDIMENTATION RATE - Abnormal; Notable for the following:    Sed Rate 35 (*)    All other components within normal limits  CULTURE, BLOOD (ROUTINE X 2)  CULTURE, BLOOD (ROUTINE X 2)  BODY FLUID CULTURE  GRAM STAIN  C-REACTIVE PROTEIN  URINALYSIS, ROUTINE W REFLEX MICROSCOPIC (NOT AT [facility])  SYNOVIAL CELL COUNT + DIFF, W/ CRYSTALS  GLUCOSE, SYNOVIAL FLUID  PROTEIN, SYNOVIAL FLUID  CYTOLOGY - NON PAP    EKG   EKG Interpretation None       Radiology No results found.  Procedures Procedures (including critical care time)  Medications Ordered in ED Medications  oxyCODONE (Oxy IR/ROXICODONE) immediate release tablet 30 mg (not administered)     Initial Impression / Assessment and Plan / ED Course  I have reviewed the triage vital signs and the nursing notes.  Pertinent labs & imaging results that were available during my care of the patient were reviewed by me and considered in my medical decision making (see chart for details).  Clinical Course    75 y.o. female presents with Left hip joint effusion that was noted on outpatient CT scanning a week ago. Dr. Patrice Paradise sent her here for evaluation of possible left septic hip versus malignant joint effusion with surrounding lytic lesions. Patient was unable to undergo outpatient MRI due to inability to tolerate procedure. On further review, if the concern is for septic arthritis of the hip she needs emergent rule out with aspiration of the joint. I discussed the case with Dr. Patrice Paradise who agreed with proceeding to rule out septic arthritis. I discussed the case with interventional radiology who deferred to diagnostic radiology. The radiologist on call who would perform the procedure is at [facility] so the patient will be transferred there to undergo arthrocentesis pending counts to evaluate the need for IV antibiotics and admission.  I discussed the case with Dr. Vanita Panda who accepted the patient in transfer to [facility] emergency department.  Final Clinical Impressions(s) / ED Diagnoses   Final diagnoses:  Effusion of left hip    New Prescriptions New Prescriptions   No medications on file     Leo Grosser, MD 12/23/15 [phone]

## 2015-12-22 NOTE — ED Triage Notes (Addendum)
Pt presents to ED from [facility] via EMS. Per EMS, pt c/o L hip pain x 2 weeks that has worsened the last 2 days. Pt was sent to [facility] by her doctor who suspected possible arthritic sepsis. Pt with hx of osteopenia and fibromyalgia. Pt given IR oxycodone at [facility] and reports pain 2/10. 22 G in R wrist. Denies fever, chills, N/V/D. Pt able to ambulate but reports pain when walking. Alert and oriented x 4.

## 2015-12-22 NOTE — ED Notes (Signed)
Dr. Laneta Simmers made aware of pt.

## 2015-12-22 NOTE — ED Notes (Signed)
Pt transported by radiology.

## 2015-12-22 NOTE — Procedures (Signed)
  CLINICAL DATA: [Left hip effusion]  EXAM: [Left] HIP ARTHOCENTESIS UNDER FLUOROSCOPY  FLUOROSCOPY TIME: Fluoroscopy Time:  [0 minutes, 24 seconds]  Radiation Exposure Index (if provided by the fluoroscopic device):  [113 microGy*m^2]  Number of Acquired Spot Images: [0]  PROCEDURE:  I discussed the risks (including hemorrhage, infection, and allergic reaction, among others), benefits, and alternatives to the procedure with the patient's daughter, who is currently making medical decisions for her.  We specifically discussed the high technical likelihood of success of the procedure.  The patient understood and elected to undergo the procedure.      Standard time-out was employed.  Following sterile skin prep and local anesthetic administration consisting of 1% lidocaine, a 20 gauge needle was advanced without difficulty into the [left hip] joint under fluoroscopic guidance.  A total of about 6 cc of a green tinged but otherwise clear fluid was aspirated from the joint.  The needle was subsequently removed and the skin cleansed and bandaged.  No immediate complications were observed.        IMPRESSION: [ Technically successful left hip arthrocentesis yielding 6 cc of green tinged but otherwise clear fluid from the left hip.  The sample was appropriately labeled and forwarded to the lab for analysis.   ]

## 2015-12-22 NOTE — ED Notes (Signed)
Bed: WLPT4 Expected date:  Expected time:  Means of arrival:  Comments: 

## 2015-12-23 ENCOUNTER — Observation Stay (HOSPITAL_COMMUNITY): Payer: PPO

## 2015-12-23 DIAGNOSIS — M797 Fibromyalgia: Secondary | ICD-10-CM | POA: Diagnosis not present

## 2015-12-23 DIAGNOSIS — Z803 Family history of malignant neoplasm of breast: Secondary | ICD-10-CM | POA: Diagnosis not present

## 2015-12-23 DIAGNOSIS — F329 Major depressive disorder, single episode, unspecified: Secondary | ICD-10-CM | POA: Diagnosis not present

## 2015-12-23 DIAGNOSIS — M25552 Pain in left hip: Secondary | ICD-10-CM | POA: Diagnosis present

## 2015-12-23 DIAGNOSIS — N390 Urinary tract infection, site not specified: Secondary | ICD-10-CM | POA: Diagnosis present

## 2015-12-23 DIAGNOSIS — M899 Disorder of bone, unspecified: Secondary | ICD-10-CM | POA: Diagnosis not present

## 2015-12-23 DIAGNOSIS — Z801 Family history of malignant neoplasm of trachea, bronchus and lung: Secondary | ICD-10-CM | POA: Diagnosis not present

## 2015-12-23 DIAGNOSIS — G894 Chronic pain syndrome: Secondary | ICD-10-CM

## 2015-12-23 DIAGNOSIS — Z515 Encounter for palliative care: Secondary | ICD-10-CM | POA: Diagnosis not present

## 2015-12-23 DIAGNOSIS — Z79899 Other long term (current) drug therapy: Secondary | ICD-10-CM | POA: Diagnosis not present

## 2015-12-23 DIAGNOSIS — Z791 Long term (current) use of non-steroidal anti-inflammatories (NSAID): Secondary | ICD-10-CM | POA: Diagnosis not present

## 2015-12-23 DIAGNOSIS — Z981 Arthrodesis status: Secondary | ICD-10-CM | POA: Diagnosis not present

## 2015-12-23 DIAGNOSIS — F17210 Nicotine dependence, cigarettes, uncomplicated: Secondary | ICD-10-CM | POA: Diagnosis not present

## 2015-12-23 DIAGNOSIS — E559 Vitamin D deficiency, unspecified: Secondary | ICD-10-CM | POA: Diagnosis not present

## 2015-12-23 DIAGNOSIS — G8929 Other chronic pain: Secondary | ICD-10-CM | POA: Diagnosis not present

## 2015-12-23 DIAGNOSIS — Z96651 Presence of right artificial knee joint: Secondary | ICD-10-CM | POA: Diagnosis not present

## 2015-12-23 DIAGNOSIS — Z8249 Family history of ischemic heart disease and other diseases of the circulatory system: Secondary | ICD-10-CM | POA: Diagnosis not present

## 2015-12-23 DIAGNOSIS — N3000 Acute cystitis without hematuria: Secondary | ICD-10-CM

## 2015-12-23 DIAGNOSIS — M25452 Effusion, left hip: Principal | ICD-10-CM

## 2015-12-23 DIAGNOSIS — Z833 Family history of diabetes mellitus: Secondary | ICD-10-CM | POA: Diagnosis not present

## 2015-12-23 DIAGNOSIS — M8580 Other specified disorders of bone density and structure, unspecified site: Secondary | ICD-10-CM | POA: Diagnosis not present

## 2015-12-23 DIAGNOSIS — Z6829 Body mass index (BMI) 29.0-29.9, adult: Secondary | ICD-10-CM | POA: Diagnosis not present

## 2015-12-23 DIAGNOSIS — Z7189 Other specified counseling: Secondary | ICD-10-CM

## 2015-12-23 DIAGNOSIS — Z8262 Family history of osteoporosis: Secondary | ICD-10-CM | POA: Diagnosis not present

## 2015-12-23 DIAGNOSIS — Z9842 Cataract extraction status, left eye: Secondary | ICD-10-CM | POA: Diagnosis not present

## 2015-12-23 DIAGNOSIS — Z9841 Cataract extraction status, right eye: Secondary | ICD-10-CM | POA: Diagnosis not present

## 2015-12-23 DIAGNOSIS — F17200 Nicotine dependence, unspecified, uncomplicated: Secondary | ICD-10-CM | POA: Diagnosis not present

## 2015-12-23 DIAGNOSIS — Z823 Family history of stroke: Secondary | ICD-10-CM | POA: Diagnosis not present

## 2015-12-23 LAB — HEPATIC FUNCTION PANEL
ALBUMIN: 3.4 g/dL — AB (ref 3.5–5.0)
ALT: 11 U/L — ABNORMAL LOW (ref 14–54)
AST: 16 U/L (ref 15–41)
Alkaline Phosphatase: 85 U/L (ref 38–126)
Bilirubin, Direct: 0.1 mg/dL (ref 0.1–0.5)
Indirect Bilirubin: 0.4 mg/dL (ref 0.3–0.9)
Total Bilirubin: 0.5 mg/dL (ref 0.3–1.2)
Total Protein: 5.8 g/dL — ABNORMAL LOW (ref 6.5–8.1)

## 2015-12-23 LAB — MISC LABCORP TEST (SEND OUT): Labcorp test code: 19497

## 2015-12-23 LAB — SYNOVIAL CELL COUNT + DIFF, W/ CRYSTALS
CRYSTALS FLUID: NONE SEEN
Lymphocytes-Synovial Fld: 8 % (ref 0–20)
Monocyte-Macrophage-Synovial Fluid: 90 % (ref 50–90)
NEUTROPHIL, SYNOVIAL: 2 % (ref 0–25)
WBC, Synovial: 124 /mm3 (ref 0–200)

## 2015-12-23 MED ORDER — HYDROMORPHONE HCL 2 MG/ML IJ SOLN: 1.0000 mg | INTRAMUSCULAR | Status: DC | PRN

## 2015-12-23 MED ORDER — HYDROMORPHONE HCL 2 MG/ML IJ SOLN
1.0000 mg | INTRAMUSCULAR | Status: DC | PRN
  Administered 2015-12-23 (×2): 1 mg via INTRAVENOUS
  Filled 2015-12-23 (×2): qty 1

## 2015-12-23 MED ORDER — GABAPENTIN 300 MG PO CAPS
300.0000 mg | ORAL_CAPSULE | ORAL | Status: DC
  Administered 2015-12-23 – 2015-12-25 (×5): 300 mg via ORAL
  Filled 2015-12-23 (×5): qty 1

## 2015-12-23 MED ORDER — ENOXAPARIN SODIUM 40 MG/0.4ML ~~LOC~~ SOLN
40.0000 mg | SUBCUTANEOUS | Status: DC
  Administered 2015-12-23 – 2015-12-25 (×3): 40 mg via SUBCUTANEOUS
  Filled 2015-12-23 (×3): qty 0.4

## 2015-12-23 MED ORDER — IBUPROFEN 200 MG PO TABS
800.0000 mg | ORAL_TABLET | Freq: Four times a day (QID) | ORAL | Status: DC | PRN
  Administered 2015-12-23: 800 mg via ORAL
  Filled 2015-12-23: qty 4
  Filled 2015-12-23: qty 1

## 2015-12-23 MED ORDER — VITAMIN D 1000 UNITS PO TABS
5000.0000 [IU] | ORAL_TABLET | Freq: Two times a day (BID) | ORAL | Status: DC
  Administered 2015-12-23 – 2015-12-25 (×5): 5000 [IU] via ORAL
  Filled 2015-12-23 (×5): qty 5

## 2015-12-23 MED ORDER — IBUPROFEN 200 MG PO TABS
800.0000 mg | ORAL_TABLET | Freq: Two times a day (BID) | ORAL | Status: DC | PRN
  Administered 2015-12-23 – 2015-12-25 (×2): 800 mg via ORAL
  Filled 2015-12-23 (×2): qty 4

## 2015-12-23 MED ORDER — SENNOSIDES-DOCUSATE SODIUM 8.6-50 MG PO TABS
1.0000 | ORAL_TABLET | Freq: Two times a day (BID) | ORAL | Status: DC
  Administered 2015-12-23 – 2015-12-25 (×3): 1 via ORAL
  Filled 2015-12-23 (×5): qty 1

## 2015-12-23 MED ORDER — FLUOXETINE HCL 20 MG PO CAPS
40.0000 mg | ORAL_CAPSULE | Freq: Every day | ORAL | Status: DC
  Administered 2015-12-23 – 2015-12-25 (×3): 40 mg via ORAL
  Filled 2015-12-23 (×3): qty 2

## 2015-12-23 MED ORDER — POTASSIUM CHLORIDE CRYS ER 20 MEQ PO TBCR
40.0000 meq | EXTENDED_RELEASE_TABLET | Freq: Once | ORAL | Status: AC
  Administered 2015-12-23: 40 meq via ORAL
  Filled 2015-12-23: qty 2

## 2015-12-23 MED ORDER — GABAPENTIN 300 MG PO CAPS
900.0000 mg | ORAL_CAPSULE | Freq: Every day | ORAL | Status: DC
  Administered 2015-12-23 – 2015-12-24 (×3): 900 mg via ORAL
  Filled 2015-12-23: qty 9
  Filled 2015-12-23 (×2): qty 3

## 2015-12-23 MED ORDER — LORATADINE 10 MG PO TABS
10.0000 mg | ORAL_TABLET | Freq: Every day | ORAL | Status: DC
  Administered 2015-12-23 – 2015-12-25 (×3): 10 mg via ORAL
  Filled 2015-12-23 (×3): qty 1

## 2015-12-23 MED ORDER — POLYETHYLENE GLYCOL 3350 17 G PO PACK: 17.0000 g | PACK | Freq: Every day | ORAL | Status: DC | PRN

## 2015-12-23 MED ORDER — HYDROMORPHONE HCL 2 MG/ML IJ SOLN: 1.0000 mg | Freq: Four times a day (QID) | INTRAMUSCULAR | Status: DC | PRN

## 2015-12-23 MED ORDER — OXYCODONE HCL 5 MG PO TABS: 30.0000 mg | ORAL_TABLET | Freq: Four times a day (QID) | ORAL | Status: DC | PRN

## 2015-12-23 MED ORDER — ALPRAZOLAM 0.25 MG PO TABS
0.5000 mg | ORAL_TABLET | Freq: Three times a day (TID) | ORAL | Status: DC | PRN
  Administered 2015-12-23 – 2015-12-25 (×6): 1 mg via ORAL
  Filled 2015-12-23 (×6): qty 4

## 2015-12-23 MED ORDER — DEXTROSE 5 % IV SOLN
1.0000 g | INTRAVENOUS | Status: DC
  Administered 2015-12-23 – 2015-12-25 (×3): 1 g via INTRAVENOUS
  Filled 2015-12-23 (×4): qty 10

## 2015-12-23 MED ORDER — OXYCODONE HCL 5 MG PO TABS
30.0000 mg | ORAL_TABLET | ORAL | Status: DC | PRN
  Administered 2015-12-23 – 2015-12-25 (×10): 30 mg via ORAL
  Filled 2015-12-23 (×11): qty 6

## 2015-12-23 NOTE — Progress Notes (Addendum)
Patient seen and examined, and admitted by Dr. Alcario Drought this morning.  Briefly 75 year old female with chronic pain, osteopenia presented with 2 week history of worsening Pain. Described as constant, worse with movement, better with home narcotics and she has been increasing dose frequency. CT of the pelvis showed no acute injury, left hip joint effusion, recommended arthrocentesis. Small lucencies in the left ischium and left greater trochanter of uncertain etiology can be seen in multiple myeloma.  BP (!) 180/91 (BP Location: Right Arm) Comment: RN notified  Pulse 82   Temp 98 F (36.7 C) (Oral)   Resp 18   Ht _0  (1.626 m)   Wt 78.9 kg (174 lb)   SpO2 97%   BMI 29.87 kg/m   Labs and imaging reviewed - Patient underwent left hip arthrocentesis, studies appears not septic. - Follow SPEP, UPEP, immunofixation - Ordered bone survey - Requested palliative consult with assistance for pain management - PT eval - UA positive for UTI, continue Rocephin, follow urine culture   RAI,RIPUDEEP M.D. [facility] 12/23/2015, 10:20 AM  Pager: [phone]

## 2015-12-23 NOTE — Evaluation (Signed)
Physical Therapy Evaluation Patient Details Name: Shirley [name] MRN: [id number] DOB: [birth year] Today's Date: 12/23/2015   History of Present Illness  75 y.o. female admitted to [facility] on 12/22/15 for worsening left hip pain.  Arthrodesis did not reveal a spetic joint.  CT scan showed small lucencies in the left ischium and left greater trochanter of uncertain etiology can be seen in multiple myeloma.  Pt with significant PMHx of osteopenia, fibromyalgia, DVT of UE, cervical spondylosis s/p neck surgery, L wrist fx surgeries x 2, spinal fusion, R TKA, and multiple bil foot surgeries.   Clinical Impression  Pt was able to get up and walk with min assist and RW.  Her baseline is to walk with a cane, so we will attempt this tomorrow to see if she needs a more supportive assistive device.  Pt will likely be able to go home, and would benefit from f/u therapy at home for balance and gait progression.   PT to follow acutely for deficits listed below.       Follow Up Recommendations Home health PT;Supervision for mobility/OOB    Equipment Recommendations  Rolling walker with 5" wheels    Recommendations for Other Services   NA    Precautions / Restrictions Precautions Precautions: Fall      Mobility  Bed Mobility Overal bed mobility: Needs Assistance Bed Mobility: Supine to Sit     Supine to sit: Min assist     General bed mobility comments: Min hand held assist to get to EOB.   Transfers Overall transfer level: Needs assistance Equipment used: Rolling walker (2 wheeled) Transfers: Sit to/from Stand Sit to Stand: Min guard         General transfer comment: min guard assist for safety and balance during transitions.   Ambulation/Gait Ambulation/Gait assistance: Min assist Ambulation Distance (Feet): 40 Feet Assistive device: Rolling walker (2 wheeled) Gait Pattern/deviations: Step-through pattern;Shuffle;Trunk flexed Gait velocity: decreased   General Gait Details: Pt leans  to  her right due to the curve in her spine, verbal cues for closer proximity to RW, min assist to support trunk for balance during gait.          Balance Overall balance assessment: Needs assistance Sitting-balance support: Feet supported;No upper extremity supported Sitting balance-Leahy Scale: Fair     Standing balance support: Bilateral upper extremity supported Standing balance-Leahy Scale: Poor                               Pertinent Vitals/Pain Pain Assessment: 0-10 Pain Score: 4  Pain Location: left hip Pain Descriptors / Indicators: Jabbing Pain Intervention(s): Limited activity within patient's tolerance;Monitored during session;Repositioned;Premedicated before session    Home Living Family/patient expects to be discharged to:: Private residence Living Arrangements: Spouse/significant other (pt has had 2 strokes with residual cognative deficits) Available Help at Discharge: Family Type of Home: House Home Access: Stairs to enter Entrance Stairs-Rails: Right;Left;Can reach both Entrance Stairs-Number of Steps: 2 Home Layout: One level Home Equipment: Cane - single point;Shower seat - built in;Walker - standard      Prior Function Level of Independence: Independent with assistive device(s)         Comments: prior to hip pain starting she used [facility] for community ambulation, since hip pain she has started using a cane in the house.     Hand Dominance   Dominant Hand: Right    Extremity/Trunk Assessment   Upper Extremity Assessment: Defer to  OT evaluation           Lower Extremity Assessment: Generalized weakness      Cervical / Trunk Assessment: Other exceptions  Communication      Cognition Arousal/Alertness: Awake/alert Behavior During Therapy: WFL for tasks assessed/performed Overall Cognitive Status: Within Functional Limits for tasks assessed                         Exercises General Exercises - Lower  Extremity Long Arc Quad: AROM;Both;10 reps;Seated Hip ABduction/ADduction: AROM;Both;10 reps;Seated Hip Flexion/Marching: AROM;Both;10 reps;Seated Toe Raises: AROM;Both;10 reps;Seated Heel Raises: AROM;Both;10 reps;Seated   Assessment/Plan    PT Assessment Patient needs continued PT services  PT Problem List Decreased strength;Decreased activity tolerance;Decreased mobility;Decreased balance;Decreased knowledge of use of DME;Pain          PT Treatment Interventions DME instruction;Gait training;Stair training;Functional mobility training;Therapeutic activities;Therapeutic exercise;Balance training;Patient/family education    PT Goals (Current goals can be found in the Care Plan section)  Acute Rehab PT Goals Patient Stated Goal: to get her hip to feel better, get back to only using a cane when outside of the house PT Goal Formulation: With patient Time For Goal Achievement: 01/06/16 Potential to Achieve Goals: Good    Frequency Min 3X/week           End of Session Equipment Utilized During Treatment: Gait belt Activity Tolerance: Patient limited by fatigue;Patient limited by pain Patient left: in chair;with call bell/phone within reach;with family/visitor present      Functional Assessment Tool Used: assist level Functional Limitation: Mobility: Walking and moving around Mobility: Walking and Moving Around Current Status [phone]): At least 20 percent but less than 40 percent impaired, limited or restricted Mobility: Walking and Moving Around Goal Status [phone]): At least 1 percent but less than 20 percent impaired, limited or restricted    Time: [phone] (we talked quite a bit, no charge) PT Time Calculation (min) (ACUTE ONLY): 41 min   Charges:   PT Evaluation $PT Eval Moderate Complexity: 1 Procedure PT Treatments $Gait Training: 8-22 mins   PT G Codes:   PT G-Codes **NOT FOR INPATIENT CLASS** Functional Assessment Tool Used: assist level Functional Limitation:  Mobility: Walking and moving around Mobility: Walking and Moving Around Current Status ([id number]): At least 20 percent but less than 40 percent impaired, limited or restricted Mobility: Walking and Moving Around Goal Status [phone]): At least 1 percent but less than 20 percent impaired, limited or restricted    Lakethia Coppess B. Alisse Tuite, PT, DPT [phone]   12/23/2015, 5:49 PM

## 2015-12-23 NOTE — H&P (Signed)
History and Physical    Shirley [name] OBS:[id number] DOB: 1940/09/02 DOA: 12/22/2015   PCP: Sheral Apley Chief Complaint:  Chief Complaint  Patient presents with  . Hip Pain    HPI: Shirley [name] is a 75 y.o. female with medical history significant of chronic pain, spinal surgery, osteopenia.  Patient presents to the ED with 2 week history of worsening L hip pain.  Pain is constant, worse with movement, better with home narcotics which she has been escalating dosing frequency of, now on oxy IR 52m Q4H at this point.  Has an MRI of the L hip scheduled but due to pain when laying down flat had to have it scheduled with anesthesia.  Due to worsening pain got sent in to ED.  ED Course: Hip Tap shows no signs of infection, CT hip done 7 days ago show lucencies in greater trochanter of femur and pelvis that are suspicious for multiple myeloma.  Review of Systems: As per HPI otherwise 10 point review of systems negative.    Past Medical History:  Diagnosis Date  . Allergy    vasomotor rhinitis  . Asthma    h/o asthma and bronchitis  . BV (bacterial vaginosis)    history of frequent  . Cervical spondylosis    herniated disk protruding @ C6-7, impinging cord and left axillary root sleeve.  . Chronic pain   . Depression   . [place]    h/o left(after wrist fracture/surgery); no residual thrombus or phlebitis by Dopplers July 2014  . Edema   . Fibromyalgia    sees Dr.Phillips-pain management  . GERD (gastroesophageal reflux disease)    h/o  . Osteopenia    mild at hip (T-1.3)  . Varicose veins   . 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 [facility]illeen Kempf@ [facility]  . Vision problem    wears glasses  . Vitamin D deficiency      Past Surgical History:  Procedure Laterality Date  . ABDOMINAL HYSTERECTOMY  1999   BSO for endometriosis  . APPENDECTOMY    . BACK SURGERY  age 75  ruptured disk L3-4   . CATARACT EXTRACTION  2000   B/L  . DOPPLER ECHOCARDIOGRAPHY  July 2012   Normal EF 60-65% , mild concentric LVH. Grade 2 diastolic dysfunction with elevated EDP. Massive left atrial dilation. Pulmonary pressures 30-40 mm Hg; aortic sclerosis but no stenosis. Moderate TR  . FINGER SURGERY     left finger for tender rupture from fall.  .Marland KitchenFOOT SURGERY     multiple(at least 4 on right, 5 on left)-left Dr.Kerner()11/08,left Dr.Nunley-excision 2nd and 3rd mt heads w/artho and hammertoe surgery 4th and 5th 04/2006. left great toe IP fusion and revision of fusion of 2nd through 5th toes 12/2005. Right foot surgeries  Dr.Bednarz 04/2008 and 04/2009.  .Marland KitchenKNEE SURGERY     right knee replacement  . NECK SURGERY  2007  . REFRACTIVE SURGERY  2007   left eye  . SHOULDER SURGERY     x8 (4 on rt side and 4 on left side)  . SPINAL FUSION  6/09   Dr.Cohen  . TONSILLECTOMY AND ADENOIDECTOMY    . WRIST SURGERY     2 surgeries left wrist  after fracture(complicated by DVT)     reports that she has been smoking.  She has never used smokeless tobacco. She reports that she does not drink alcohol or use drugs.  No Known Allergies  Family History  Problem Relation Age of Onset  . Stroke Mother   . Osteoarthritis Mother     Died at age 85  . Heart disease Father   . Lung cancer Father   . Heart failure Father     Died at age 71  . Osteoporosis Sister   . Diabetes Brother   . Heart attack Brother 71  . Diabetes Brother   . Diabetes Brother   . Heart attack Brother 63  . Breast cancer Paternal Aunt   . Heart failure Maternal Grandmother     Died at age 47, unsure of her heart disease  . Heart failure Paternal Grandfather     Died at age 77, unsure of cardiac disease.      Prior to Admission medications    Medication Sig Start Date End Date Taking? Authorizing Provider  ALPRAZolam Duanne Moron) 1 MG tablet Take 0.5-1 mg by mouth 3 (three) times daily as needed for anxiety or sleep.    Yes Historical Provider, MD  amphetamine-dextroamphetamine (ADDERALL) 20 MG tablet Take 20 mg by mouth daily as needed (ADHD).  05/13/15 12/22/15 Yes Historical Provider, MD  cetirizine (ZYRTEC) 10 MG tablet Take 10 mg by mouth daily as needed for allergies.  06/24/14  Yes Historical Provider, MD  Cholecalciferol (VITAMIN D3) 5000 UNITS CAPS Take 5,000 Units by mouth 2 (two) times daily.    Yes Historical Provider, MD  FLUoxetine (PROZAC) 40 MG capsule Take 40 mg by mouth daily.   Yes Historical Provider, MD  gabapentin (NEURONTIN) 300 MG capsule Take 300-900 mg by mouth 3 (three) times daily. Take 351m in the am and afternoon, and then take 904mat night   Yes Historical Provider, MD  ibuprofen (ADVIL,MOTRIN) 200 MG tablet Take 800 mg by mouth every 6 (six) hours as needed for moderate pain.   Yes Historical Provider, MD  oxycodone (ROXICODONE) 30 MG immediate release tablet Take 30 mg by mouth every 4 (four) hours as needed for pain (for pain). Reported on 06/03/2015   Yes Historical Provider, MD  torsemide (DEMADEX) 10 MG tablet Take 10 mg by mouth daily. And may take an extra dose if needed for fluid/edema 01/13/15  Yes Historical Provider, MD  metoprolol tartrate (LOPRESSOR) 25 MG tablet Take 1 tablet (25 mg total) by mouth 2 (two) times daily. Patient not taking: Reported on 12/22/2015 04/09/15   ScLiliane ShiPA-C    Physical Exam: Vitals:   12/22/15 1546 12/22/15 1548 12/22/15 1853 12/22/15 2118  BP: 167/62  142/70   Pulse: 115  76   Resp: 17  19   Temp: 98.4 F (36.9 C)     TempSrc: Oral     SpO2: 98%  96%   Weight:  78.9 kg (174 lb)  78.9 kg (174 lb)  Height:  '5\' 4"'  (1.626 m)  '5\' 4"'  (1.626 m)      Constitutional: NAD, calm, comfortable Eyes: PERRL, lids and conjunctivae normal ENMT: Mucous membranes  are moist. Posterior pharynx clear of any exudate or lesions.Normal dentition.  Neck: normal, supple, no masses, no thyromegaly Respiratory: clear to auscultation bilaterally, no wheezing, no crackles. Normal respiratory effort. No accessory muscle use.  Cardiovascular: Regular rate and rhythm, no murmurs / rubs / gallops. No extremity edema. 2+ pedal  pulses. No carotid bruits.  Abdomen: no tenderness, no masses palpated. No hepatosplenomegaly. Bowel sounds positive.  Musculoskeletal: no clubbing / cyanosis. No joint deformity upper and lower extremities. Good ROM, no contractures. Normal muscle tone.  Skin: no rashes, lesions, ulcers. No induration Neurologic: CN 2-12 grossly intact. Sensation intact, DTR normal. Strength 5/5 in all 4.  Psychiatric: Normal judgment and insight. Alert and oriented x 3. Normal mood.    Labs on Admission: I have personally reviewed following labs and imaging studies  CBC:  Recent Labs Lab 12/22/15 1736  WBC 6.6  NEUTROABS 4.6  HGB 10.0*  HCT 32.4*  MCV 92.3  PLT 478*   Basic Metabolic Panel:  Recent Labs Lab 12/22/15 1736  NA 138  K 4.8  CL 108  CO2 23  GLUCOSE 103*  BUN 32*  CREATININE 1.22*  CALCIUM 8.7*   GFR: Estimated Creatinine Clearance: 39.3 mL/min (by C-G formula based on SCr of 1.22 mg/dL (H)). Liver Function Tests: No results for input(s): AST, ALT, ALKPHOS, BILITOT, PROT, ALBUMIN in the last 168 hours. No results for input(s): LIPASE, AMYLASE in the last 168 hours. No results for input(s): AMMONIA in the last 168 hours. Coagulation Profile: No results for input(s): INR, PROTIME in the last 168 hours. Cardiac Enzymes: No results for input(s): CKTOTAL, CKMB, CKMBINDEX, TROPONINI in the last 168 hours. BNP (last 3 results) No results for input(s): PROBNP in the last 8760 hours. HbA1C: No results for input(s): HGBA1C in the last 72 hours. CBG: No results for input(s): GLUCAP in the last 168 hours. Lipid Profile: No  results for input(s): CHOL, HDL, LDLCALC, TRIG, CHOLHDL, LDLDIRECT in the last 72 hours. Thyroid Function Tests: No results for input(s): TSH, T4TOTAL, FREET4, T3FREE, THYROIDAB in the last 72 hours. Anemia Panel: No results for input(s): VITAMINB12, FOLATE, FERRITIN, TIBC, IRON, RETICCTPCT in the last 72 hours. Urine analysis:    Component Value Date/Time   COLORURINE YELLOW 12/22/2015 1848   APPEARANCEUR CLOUDY (A) 12/22/2015 1848   LABSPEC 1.022 12/22/2015 1848   PHURINE 6.0 12/22/2015 1848   GLUCOSEU NEGATIVE 12/22/2015 1848   HGBUR NEGATIVE 12/22/2015 1848   BILIRUBINUR NEGATIVE 12/22/2015 1848   KETONESUR NEGATIVE 12/22/2015 1848   PROTEINUR NEGATIVE 12/22/2015 1848   NITRITE POSITIVE (A) 12/22/2015 1848   LEUKOCYTESUR MODERATE (A) 12/22/2015 1848   Sepsis Labs: '@LABRCNTIP' (procalcitonin:4,lacticidven:4) ) Recent Results (from the past 240 hour(s))  Blood culture (routine x 2)     Status: None (Preliminary result)   Collection Time: 12/22/15  5:36 PM  Result Value Ref Range Status   Specimen Description   Final    BLOOD RIGHT ANTECUBITAL Performed at [facility]  Final   Culture PENDING  Incomplete   Report Status PENDING  Incomplete  Body fluid culture     Status: None (Preliminary result)   Collection Time: 12/22/15 11:02 PM  Result Value Ref Range Status   Specimen Description FLUID SYNOVIAL LEFT HIP  Final   Special Requests NONE  Final   Gram Stain   Final    RARE WBC PRESENT, PREDOMINANTLY MONONUCLEAR NO ORGANISMS SEEN    Culture PENDING  Incomplete   Report Status PENDING  Incomplete     Radiological Exams on Admission: Dg Fluoro Guided Needle Plc Aspiration/injection Loc  Result Date: 12/22/2015 CLINICAL DATA:  Left hip effusion EXAM: Left HIP ARTHOCENTESIS UNDER FLUOROSCOPY FLUOROSCOPY TIME:  Fluoroscopy Time:  0 minutes, 24 seconds Radiation Exposure Index (if provided  by the fluoroscopic device):  113 microGy*m^2 Number of Acquired Spot Images: 0 PROCEDURE: I discussed the risks (including hemorrhage, infection, and allergic reaction, among others), benefits, and alternatives to the procedure with the patient's daughter, who is currently making medical decisions for her. We specifically discussed the high technical likelihood of success of the procedure. The patient understood and elected to undergo the procedure. Standard time-out was employed. Following sterile skin prep and local anesthetic administration consisting of 1% lidocaine, a 20 gauge needle was advanced without difficulty into the left hip joint under fluoroscopic guidance. A total of about 6 cc of a green tinged but otherwise clear fluid was aspirated from the joint. The needle was subsequently removed and the skin cleansed and bandaged. No immediate complications were observed. IMPRESSION: 1. Technically successful left hip arthrocentesis yielding 6 cc of green tinged but otherwise clear fluid from the left hip. The sample was appropriately labeled and forwarded to the lab for analysis. Electronically Signed   By: Van Clines M.D.   On: 12/22/2015 22:27    EKG: Independently reviewed.  Assessment/Plan Active Problems:   Left hip pain    1. Left hip pain - 1. Pain control with dilaudid PRN 2. May benefit from pal care consult for ongoing pain management given increasing med requirements at home, especially if testing comes back positive for MM. 3. SPEP, UPEP, for MM testing 4. PT/OT 2. UTI on UA - 1. Rocephin 2. Culture pending   DVT prophylaxis: Lovenox Code Status: Full Family Communication: Family at bedside Consults called: None Admission status: Admit to obs   [facility], [facility] Pager [phone] from 7PM-7AM  If 7AM-7PM, please contact the day physician for the patient [email or web address] Password [email or web address]  12/23/2015, 1:33 AM

## 2015-12-23 NOTE — Progress Notes (Signed)
PT Cancellation Note  Patient Details Name: Shirley [name] MRN: [id number] DOB: January 26, 1941   Cancelled Treatment:    Reason Eval/Treat Not Completed: Patient at procedure or test/unavailable.  Per RN pt is a bone scan.  PT will attempt to check back later as time allows.   Thanks,    Barbarann Ehlers. [place], [place], DPT [phone]   12/23/2015, 2:08 PM

## 2015-12-23 NOTE — Consult Note (Signed)
                                                                                 Consultation Note Date: 12/23/2015   Patient Name: Shirley [name]  DOB: 06/27/1940  MRN: [id number]  Age / Sex: 75 y.o., female  PCP: Suzanne Hedgecock, PA-C Referring Physician: Ripudeep K Rai, MD  Reason for Consultation: Pain control and Psychosocial/spiritual support  HPI/Patient Profile: 75 y.o. female  admitted on 12/22/2015 with  female with medical history significant of chronic pain, spinal surgery, osteopenia.    Patient presents to the ED with 2 week history of worsening L hip pain.  Pain is constant, worse with movement, better with home narcotics which she has been escalating dosing frequency of, now on oxy IR 30mg Q4H at this point. She is managed by pain specialist   Has an MRI of the L hip scheduled but due to pain when laying down flat had to have it scheduled with anesthesia.  Due to worsening pain got sent in to ED.  ED Course: Hip Tap shows no signs of infection, CT hip done 7 days ago show lucencies in greater trochanter of femur and pelvis that are suspicious for multiple myeloma.   Clinical Assessment and Goals of Care:   HCPOA- is daughter Jennifer Conner    SUMMARY OF RECOMMENDATIONS    Code Status/Advance Care Planning:  Full code  Discussed importance of documentation of advanced directives   Symptom Management:   Chronic pain-  Patient has lived with chronic pain for many years.    She tells me she has had 23 orthopedic surgery, fibromyalgia and neuropathy in both feet.    She was a patient of the [facility] in the past and now is treated by Suzanne Hedgecock PA on [place].   She tells me she is compliant with all regulations.    However in the past two weeks she has been having excruciating left hip pain unmanaged by her regular medications.  She is prescribed Oxycodone 30 mg po every 4 hrs prn.   We discussed utilization of extended release  medications but she tells me that "they didn't work in the past".  She reports living with and tolerating pain at an 8 out of 10 on a daily basis. She understands that she will never be pain free and seek pain relief to maximize function.  Recommendations:  - Utilize oral  Agents in anticipation of discharge home.  Continue with treatment regiment as prescribed at pain clinic for continuity.  She supports this plan        Oxycodone 30 mg po every 4 hrs prn        Add Ibruprofen 800 mg po every 12 hrs prn        Bedside commode         K-pad to left hip prn  - will f/u in morning to evaluate efficacy    Family is anxious for feedback on labs clarifying possible multiple myeloma suggested on CT, discussed with Dr Rai     Palliative Prophylaxis:   Bowel Regimen   Psycho-social/Spiritual:    She has strong family support.    She lives at home with her husband who has cognitive deficients 2/2 CVA.  PMT will continue to support holsitically    Discharge Planning: Home with Home Health      Primary Diagnoses: Present on Admission: . Left hip pain . Effusion of left hip . UTI (urinary tract infection)   I have reviewed the medical record, interviewed the patient and family, and examined the patient. The following aspects are pertinent.  Past Medical History:  Diagnosis Date  . Allergy    vasomotor rhinitis  . Asthma    h/o asthma and bronchitis  . BV (bacterial vaginosis)    history of frequent  . Cervical spondylosis    herniated disk protruding @ C6-7, impinging cord and left axillary root sleeve.  . Chronic pain   . Depression   . DVT of upper extremity (deep vein thrombosis) ([facility])    h/o left(after wrist fracture/surgery); no residual thrombus or phlebitis by Dopplers July 2014  . Edema   . Fibromyalgia    sees Dr.Phillips-pain management  . GERD (gastroesophageal reflux disease)    h/o  . Osteopenia    mild at hip (T-1.3)  . Varicose veins   . Varicose  veins of both legs with edema 08/2012   Also pain; bilateral GSV dilated with reflux, R Short SV dilated with reflux; not amenable to VNUS ablation due to tortuosity and superficial nature of veins -- recommeded referral to Dr. Krusch @ [facility]  . Vision problem    wears glasses  . Vitamin D deficiency    Social History   Social History  . Marital status: Married    Spouse name: [place]  . Number of children: N/A  . Years of education: N/A   Social History Main Topics  . Smoking status: Current Some Day Smoker    Last attempt to quit: 02/14/1957  . Smokeless tobacco: Never Used     Comment: + h/o passive exposure (second hand) from family, husband (none currently)  . Alcohol use No  . Drug use: No  . Sexual activity: Not Asked   Other Topics Concern  . None   Social History Narrative   Lives with husband.  3 children ([place], [place], [place]), 5 grandchildren   Family History  Problem Relation Age of Onset  . Stroke Mother   . Osteoarthritis Mother     Died at age 92  . Heart disease Father   . Lung cancer Father   . Heart failure Father     Died at age 78  . Osteoporosis Sister   . Diabetes Brother   . Heart attack Brother 63  . Diabetes Brother   . Diabetes Brother   . Heart attack Brother 62  . Breast cancer Paternal Aunt   . Heart failure Maternal Grandmother     Died at age 80, unsure of her heart disease  . Heart failure Paternal Grandfather     Died at age 64, unsure of cardiac disease.   Scheduled Meds: . cefTRIAXone (ROCEPHIN)  IV  1 g Intravenous Q24H  . cholecalciferol  5,000 Units Oral BID  . enoxaparin (LOVENOX) injection  40 mg Subcutaneous Q24H  . FLUoxetine  40 mg Oral Daily  . gabapentin  300 mg Oral 2 times per day  . gabapentin  900 mg Oral QHS  . loratadine  10 mg Oral Daily  . senna-docusate  1 tablet Oral BID   Continuous Infusions:  PRN Meds:.ALPRAZolam, HYDROmorphone (DILAUDID) injection, polyethylene    glycol Medications Prior to Admission:  Prior to Admission medications   Medication Sig Start Date End Date Taking? Authorizing Provider  ALPRAZolam (XANAX) 1 MG tablet Take 0.5-1 mg by mouth 3 (three) times daily as needed for anxiety or sleep.    Yes Historical Provider, MD  amphetamine-dextroamphetamine (ADDERALL) 20 MG tablet Take 20 mg by mouth daily as needed (ADHD).  05/13/15 12/22/15 Yes Historical Provider, MD  cetirizine (ZYRTEC) 10 MG tablet Take 10 mg by mouth daily as needed for allergies.  06/24/14  Yes Historical Provider, MD  Cholecalciferol (VITAMIN D3) 5000 UNITS CAPS Take 5,000 Units by mouth 2 (two) times daily.    Yes Historical Provider, MD  FLUoxetine (PROZAC) 40 MG capsule Take 40 mg by mouth daily.   Yes Historical Provider, MD  gabapentin (NEURONTIN) 300 MG capsule Take 300-900 mg by mouth 3 (three) times daily. Take 300mg in the am and afternoon, and then take 900mg at night   Yes Historical Provider, MD  ibuprofen (ADVIL,MOTRIN) 200 MG tablet Take 800 mg by mouth every 6 (six) hours as needed for moderate pain.   Yes Historical Provider, MD  oxycodone (ROXICODONE) 30 MG immediate release tablet Take 30 mg by mouth every 4 (four) hours as needed for pain (for pain). Reported on 06/03/2015   Yes Historical Provider, MD  torsemide (DEMADEX) 10 MG tablet Take 10 mg by mouth daily. And may take an extra dose if needed for fluid/edema 01/13/15  Yes Historical Provider, MD   No Known Allergies Review of Systems  Constitutional: Positive for activity change and fatigue.  Gastrointestinal: Negative.   Musculoskeletal: Positive for back pain.  Neurological: Positive for weakness.    Physical Exam  Constitutional: She appears well-developed. She is cooperative.  HENT:  Mouth/Throat: Oropharynx is clear and moist.  Cardiovascular: Normal rate, regular rhythm and normal heart sounds.   Pulmonary/Chest: Effort normal and breath sounds normal.  Neurological: She is alert.  Skin:  Skin is warm and dry.    Vital Signs: BP (!) 180/91 (BP Location: Right Arm) Comment: RN notified  Pulse 82   Temp 98 F (36.7 C) (Oral)   Resp 18   Ht 5' 4" (1.626 m)   Wt 78.9 kg (174 lb)   SpO2 97%   BMI 29.87 kg/m  Pain Assessment: 0-10 POSS *See Group Information*: 1-Acceptable,Awake and alert Pain Score: 8    SpO2: SpO2: 97 % O2 Device:SpO2: 97 % O2 Flow Rate: .   IO: Intake/output summary:  Intake/Output Summary (Last 24 hours) at 12/23/15 1152 Last data filed at 12/23/15 0900  Gross per 24 hour  Intake              290 ml  Output                0 ml  Net              290 ml    LBM: Last BM Date: 12/23/15 Baseline Weight: Weight: 78.9 kg (174 lb) Most recent weight: Weight: 78.9 kg (174 lb)      Palliative Assessment/Data: 50 % currently   Discussed with Dr Rai  Time In: 1115 Time Out: 1230 Time Total: 75 min Greater than 50%  of this time was spent counseling and coordinating care related to the above assessment and plan.  Signed by: LARACH, MARY, NP   Please contact Palliative Medicine Team phone at [phone] for questions and concerns.  For individual provider: See Amion             

## 2015-12-23 NOTE — ED Provider Notes (Signed)
12:41 AM Pt with continued pain at this time.  Left hip effusion appears to not be septic based on arthrocentesis performed.  Culture still pending.  I suspect the majority of her ongoing left hip pain is these several lucencies noted on recent CT imaging of her left hip which could represent multiple myeloma.  She's required escalating doses of her extended release oxycodone over the past several weeks and is less ambulatory secondary to pain.  Patient will be admitted for ongoing pain control and further evaluation of these new lytic lesions.  She'll also benefit from working with physical therapy  Ct Pelvis Wo Contrast  Result Date: 12/15/2015 CLINICAL DATA:  Left groin pain for 2 months.  Prior back surgery. EXAM: CT PELVIS WITHOUT CONTRAST TECHNIQUE: Multidetector CT imaging of the pelvis was performed following the standard protocol without intravenous contrast. COMPARISON:  None. FINDINGS: Bones/Joint/Cartilage No fracture or dislocation. Normal alignment. Left hip joint effusion. Mild degenerative changes of bilateral sacroiliac joints and pubic symphysis. Partially visualized is posterior lumbar interbody fusion at L4-5. Posterior lumbar fusion at L5-S1. Grade 1 anterolisthesis of L5 on S1. 15 mm lucency in the left ischium. Second 10 mm lucency in the left ischium. 11 mm lucency in the left greater trochanter. Ligaments Ligaments are suboptimally evaluated by CT. Muscles and Tendons Atrophy of the peripheral left gluteus medius and maximus muscles. Mild muscle atrophy of the left gluteus minimus. Soft tissue No fluid collection or hematoma.  No soft tissue mass. IMPRESSION: 1. No acute osseous injury of the pelvis. 2. Left hip joint effusion. If there is concern regarding septic arthritis, recommend arthrocentesis. 3. Small lucencies in the left ischium and left greater trochanter of uncertain etiology. This appearance can be seen with multiple myeloma. Correlate with laboratory values.  Electronically Signed   By: Kathreen Devoid   On: 12/15/2015 12:00   Dg Fluoro Guided Needle Plc Aspiration/injection Loc  Result Date: 12/22/2015 CLINICAL DATA:  Left hip effusion EXAM: Left HIP ARTHOCENTESIS UNDER FLUOROSCOPY FLUOROSCOPY TIME:  Fluoroscopy Time:  0 minutes, 24 seconds Radiation Exposure Index (if provided by the fluoroscopic device): 113 microGy*m^2 Number of Acquired Spot Images: 0 PROCEDURE: I discussed the risks (including hemorrhage, infection, and allergic reaction, among others), benefits, and alternatives to the procedure with the patient's daughter, who is currently making medical decisions for her. We specifically discussed the high technical likelihood of success of the procedure. The patient understood and elected to undergo the procedure. Standard time-out was employed. Following sterile skin prep and local anesthetic administration consisting of 1% lidocaine, a 20 gauge needle was advanced without difficulty into the left hip joint under fluoroscopic guidance. A total of about 6 cc of a green tinged but otherwise clear fluid was aspirated from the joint. The needle was subsequently removed and the skin cleansed and bandaged. No immediate complications were observed. IMPRESSION: 1. Technically successful left hip arthrocentesis yielding 6 cc of green tinged but otherwise clear fluid from the left hip. The sample was appropriately labeled and forwarded to the lab for analysis. Electronically Signed   By: Van Clines M.D.   On: 12/22/2015 22:27      Jola Schmidt, MD 12/23/15 [phone]

## 2015-12-24 DIAGNOSIS — M25552 Pain in left hip: Secondary | ICD-10-CM

## 2015-12-24 LAB — CBC
HCT: 31.9 % — ABNORMAL LOW (ref 36.0–46.0)
HEMOGLOBIN: 10 g/dL — AB (ref 12.0–15.0)
MCH: 28.1 pg (ref 26.0–34.0)
MCHC: 31.3 g/dL (ref 30.0–36.0)
MCV: 89.6 fL (ref 78.0–100.0)
PLATELETS: 558 10*3/uL — AB (ref 150–400)
RBC: 3.56 MIL/uL — AB (ref 3.87–5.11)
RDW: 13.3 % (ref 11.5–15.5)
WBC: 6.7 10*3/uL (ref 4.0–10.5)

## 2015-12-24 LAB — PROTEIN ELECTROPHORESIS, SERUM
A/G RATIO SPE: 1.3 (ref 0.7–1.7)
ALPHA-1-GLOBULIN: 0.3 g/dL (ref 0.0–0.4)
ALPHA-2-GLOBULIN: 0.7 g/dL (ref 0.4–1.0)
Albumin ELP: 3.3 g/dL (ref 2.9–4.4)
Beta Globulin: 0.8 g/dL (ref 0.7–1.3)
GLOBULIN, TOTAL: 2.5 g/dL (ref 2.2–3.9)
Gamma Globulin: 0.7 g/dL (ref 0.4–1.8)
TOTAL PROTEIN ELP: 5.8 g/dL — AB (ref 6.0–8.5)

## 2015-12-24 LAB — BASIC METABOLIC PANEL
ANION GAP: 7 (ref 5–15)
BUN: 16 mg/dL (ref 6–20)
CO2: 26 mmol/L (ref 22–32)
Calcium: 8.8 mg/dL — ABNORMAL LOW (ref 8.9–10.3)
Chloride: 106 mmol/L (ref 101–111)
Creatinine, Ser: 0.96 mg/dL (ref 0.44–1.00)
GFR, EST NON AFRICAN AMERICAN: 56 mL/min — AB (ref >60)
Glucose, Bld: 97 mg/dL (ref 65–99)
POTASSIUM: 4.1 mmol/L (ref 3.5–5.1)
SODIUM: 139 mmol/L (ref 135–145)

## 2015-12-24 LAB — URINE CULTURE

## 2015-12-24 LAB — MISC LABCORP TEST (SEND OUT): Labcorp test code: 19588

## 2015-12-24 LAB — IMMUNOFIXATION, URINE

## 2015-12-24 NOTE — Consult Note (Signed)
    [facility] CM Primary Care Navigator  12/24/2015  Shirley [name] 1940/12/01 [id number]    Patient seen at the bedside to identify discharge needs.  Patient mentioned that "left hip was hurting so bad and getting worse" which limits activities that had led to this admission/ surgery.  Patient confirms that primary care provider is Dr. Ardith [name] with [facility]).  Patient states being able to drive prior to admission/ surgery but daughter Shirley [name].) or granddaughter Shirley [name]) will provide transportation to her doctors' appointments after discharge as stated.   Patient states using [facility] at [employer] or at [place] to obtain medications without any problem. Patient verbalized being independent with medication management at home using "pill box" system.  Patient shares living with spouse (with some memory problem).  Plan for discharge is home with Home Health services. Daughter and granddaughters Shirley [name] and Shirley [name]) will be the caregivers at home per patient.  Patient voiced understanding to call primary care provider's office once discharged, for a post discharge follow-up appointment within a week or sooner if needed.   Patient denies any other needs or concerns at this time.  For questions, please contact:  Shirley [name], BSN, RN- [facility] Primary Care Navigator  Telephone: [phone] [place]

## 2015-12-24 NOTE — Progress Notes (Signed)
Physical Therapy Treatment Patient Details Name: Shirley [name] MRN: [id number] DOB: 1940-09-22 Today's Date: 12/24/2015    History of Present Illness 75 y.o. female admitted to [facility] on 12/22/15 for worsening left hip pain.  Arthrodesis did not reveal a spetic joint.  CT scan showed small lucencies in the left ischium and left greater trochanter of uncertain etiology can be seen in multiple myeloma.  Pt with significant PMHx of osteopenia, fibromyalgia, DVT of UE, cervical spondylosis s/p neck surgery, L wrist fx surgeries x 2, spinal fusion, R TKA, and multiple bil foot surgeries.     PT Comments    Pt ambulated HHA then again with RW. Pt in agreement with RW for home to increased gait stability. She reports decreased pain today resulting in increased ability with mobility.   Follow Up Recommendations  Home health PT;Supervision for mobility/OOB     Equipment Recommendations  Rolling walker with 5" wheels    Recommendations for Other Services       Precautions / Restrictions Precautions Precautions: Fall    Mobility  Bed Mobility               General bed mobility comments: Pt received in recliner.  Transfers Overall transfer level: Needs assistance Equipment used: Ambulation equipment used Transfers: Stand Pivot Transfers Sit to Stand: Min guard Stand pivot transfers: Min guard       General transfer comment: min guard for safety. Increased time to stabilize initial standing balance.  Ambulation/Gait Ambulation/Gait assistance: Supervision Ambulation Distance (Feet): 150 Feet Assistive device: Rolling walker (2 wheeled) Gait Pattern/deviations: Step-through pattern;Decreased stride length;Trunk flexed Gait velocity: decreased   General Gait Details: Pt leans to right due to spinal curvature. Pt also ambulated 150 feet with + 1 hand held min guard assist.    Stairs            Wheelchair Mobility    Modified Rankin (Stroke Patients Only)        Balance Overall balance assessment: Needs assistance Sitting-balance support: No upper extremity supported;Feet supported Sitting balance-Leahy Scale: Good     Standing balance support: Single extremity supported;During functional activity Standing balance-Leahy Scale: Fair Standing balance comment: Able to remove single UE from RW while standing at sink to brush teeth                    Cognition Arousal/Alertness: Awake/alert Behavior During Therapy: WFL for tasks assessed/performed Overall Cognitive Status: Within Functional Limits for tasks assessed                      Exercises      General Comments        Pertinent Vitals/Pain Pain Assessment: 0-10 Pain Score: 3  Pain Location: L hip Pain Descriptors / Indicators: Sore Pain Intervention(s): Monitored during session;Limited activity within patient's tolerance;Heat applied    Home Living Family/patient expects to be discharged to:: Private residence Living Arrangements: Spouse/significant other;Other (Comment) (husband has cognitive deficits from 2 previous strokes) Available Help at Discharge: Family;Available PRN/intermittently Type of Home: House Home Access: Stairs to enter Entrance Stairs-Rails: Right;Left;Can reach both Home Layout: One level Home Equipment: Cane - single point;Bedside commode;Walker - standard;Shower seat - built in;Hand held shower head      Prior Function Level of Independence: Needs assistance  Gait / Transfers Assistance Needed: used cane at times for community mobility ADL's / Homemaking Assistance Needed: Required assistance for styling hair (able to shampoo independently with modifications) due to previous shoulder  injuries/surgeries. Required assistance to don/doff socks.     PT Goals (current goals can now be found in the care plan section) Acute Rehab PT Goals Patient Stated Goal: to get her hip to feel better, get back to only using a cane when outside of the  house PT Goal Formulation: With patient Time For Goal Achievement: 01/06/16 Potential to Achieve Goals: Good Progress towards PT goals: Progressing toward goals    Frequency    Min 3X/week      PT Plan Current plan remains appropriate    Co-evaluation             End of Session Equipment Utilized During Treatment: Gait belt Activity Tolerance: Patient tolerated treatment well Patient left: in chair;with call bell/phone within reach;with family/visitor present     Time: 2505-3976 PT Time Calculation (min) (ACUTE ONLY): 30 min  Charges:  $Gait Training: 23-37 mins                    G Codes:      Lorriane Shire 12/24/2015, 11:56 AM

## 2015-12-24 NOTE — Progress Notes (Signed)
PROGRESS NOTE    Shirley [name]  XKG:[id number] DOB: 08-Oct-1940 DOA: 12/22/2015 PCP: Sheral Apley  Brief Narrative: 75 year old female with chronic pain, osteopenia presented with 2 week history of worsening Pain. Described as constant, worse with movement, better with home narcotics and she has been increasing dose frequency x 2days. CT of the pelvis showed no acute injury, left hip joint effusion, and small lucencies in the left ischium and left greater trochanter of uncertain etiology can be seen in multiple myeloma. EDP d/w Dr.Cohen her spine surgeon and she underwent arthrocentesis in ED prior to admission.   Assessment & Plan:   1. L Hip Pain -acute on chronic -L hip effusion, s/p arthrocentesis, no evidence of infection -Bone scan suggests that the lesions are likely bony cysts  -pt and daughter now report that they feel that her pain is at baseline  And are not interested in pursuing an MRI under anesthesia at this time -continue Oxycodone 47m Q4H PRN per home regimen -Home PT at discharge -SPEP and Immunofixation pending -I dont suspect Myeloma based on her Calcium, Creatinine and absense of lytic lesions   2. UTI -continue Ceftriaxone -FU urine Cx  3. Chronic pain syndrome H/o 23 orthopedic surgeries -followed by Pain clinic  4. Chronic venous stasis -stable, supportive care  Full Code Family: d/w daughter at bedside [facility]  Consultants:    Subjective: Feels better today, feels that her Hip/back pain is at baseline now  Objective: Vitals:   12/23/15 0529 12/23/15 1500 12/23/15 1954 12/24/15 0518  BP: (!) 180/91 (!) 178/74 (!) 160/67 (!) 178/79  Pulse: 82 84 73 69  Resp: _0 Temp: 98 F (36.7 C) 98.2 F (36.8 C) 98 F (36.7 C) 97.8 F (36.6 C)  TempSrc: Oral Oral Oral Oral  SpO2:  98% 98% 99%  Weight:      Height:        Intake/Output Summary (Last 24 hours) at 12/24/15 1610 Last data filed at  12/24/15 0900  Gross per 24 hour  Intake              360 ml  Output                0 ml  Net              360 ml   Filed Weights   12/22/15 1548 12/22/15 2118  Weight: 78.9 kg (174 lb) 78.9 kg (174 lb)    Examination:  General exam: Appears calm and comfortable, AAOx3 Respiratory system: Clear to auscultation. Respiratory effort normal. Cardiovascular system: S1 & S2 heard, RRR. No JVD, murmurs, rubs, gallops or clicks. No pedal edema. Gastrointestinal system: Abdomen is nondistended, soft and nontender. No organomegaly or masses felt. Normal bowel sounds heard. Central nervous system: Alert and oriented. No focal neurological deficits. Extremities: Symmetric 5 x 5 power. Skin: No rashes, lesions or ulcers Psychiatry: Judgement and insight appear normal. Mood & affect appropriate.     Data Reviewed: I have personally reviewed following labs and imaging studies  CBC:  Recent Labs Lab 12/22/15 1736 12/24/15 0608  WBC 6.6 6.7  NEUTROABS 4.6  --   HGB 10.0* 10.0*  HCT 32.4* 31.9*  MCV 92.3 89.6  PLT 627* 5702   Basic Metabolic Panel:  Recent Labs Lab 12/22/15 1736 12/24/15 0608  NA 138 139  K 4.8 4.1  CL 108 106  CO2 23 26  GLUCOSE 103* 97  BUN 32*  16  CREATININE 1.22* 0.96  CALCIUM 8.7* 8.8*   GFR: Estimated Creatinine Clearance: 49.9 mL/min (by C-G formula based on SCr of 0.96 mg/dL). Liver Function Tests:  Recent Labs Lab 12/23/15 0154  AST 16  ALT 11*  ALKPHOS 85  BILITOT 0.5  PROT 5.8*  ALBUMIN 3.4*   No results for input(s): LIPASE, AMYLASE in the last 168 hours. No results for input(s): AMMONIA in the last 168 hours. Coagulation Profile: No results for input(s): INR, PROTIME in the last 168 hours. Cardiac Enzymes: No results for input(s): CKTOTAL, CKMB, CKMBINDEX, TROPONINI in the last 168 hours. BNP (last 3 results) No results for input(s): PROBNP in the last 8760 hours. HbA1C: No results for input(s): HGBA1C in the last 72  hours. CBG: No results for input(s): GLUCAP in the last 168 hours. Lipid Profile: No results for input(s): CHOL, HDL, LDLCALC, TRIG, CHOLHDL, LDLDIRECT in the last 72 hours. Thyroid Function Tests: No results for input(s): TSH, T4TOTAL, FREET4, T3FREE, THYROIDAB in the last 72 hours. Anemia Panel: No results for input(s): VITAMINB12, FOLATE, FERRITIN, TIBC, IRON, RETICCTPCT in the last 72 hours. Urine analysis:    Component Value Date/Time   COLORURINE YELLOW 12/22/2015 1848   APPEARANCEUR CLOUDY (A) 12/22/2015 1848   LABSPEC 1.022 12/22/2015 1848   PHURINE 6.0 12/22/2015 1848   GLUCOSEU NEGATIVE 12/22/2015 1848   HGBUR NEGATIVE 12/22/2015 1848   BILIRUBINUR NEGATIVE 12/22/2015 1848   KETONESUR NEGATIVE 12/22/2015 1848   PROTEINUR NEGATIVE 12/22/2015 1848   NITRITE POSITIVE (A) 12/22/2015 1848   LEUKOCYTESUR MODERATE (A) 12/22/2015 1848   Sepsis Labs: _0 (procalcitonin:4,lacticidven:4)  ) Recent Results (from the past 240 hour(s))  Blood culture (routine x 2)     Status: None (Preliminary result)   Collection Time: 12/22/15  5:36 PM  Result Value Ref Range Status   Specimen Description BLOOD RIGHT ANTECUBITAL  Final   Special Requests IN PEDIATRIC BOTTLE 3CC  Final   Culture   Final    NO GROWTH 2 DAYS Performed at [facility]    Report Status PENDING  Incomplete  Blood culture (routine x 2)     Status: None (Preliminary result)   Collection Time: 12/22/15  6:03 PM  Result Value Ref Range Status   Specimen Description BLOOD RIGHT WRIST  Final   Special Requests BOTTLES DRAWN AEROBIC AND ANAEROBIC 5CC  Final   Culture   Final    NO GROWTH 2 DAYS Performed at [facility]    Report Status PENDING  Incomplete  Body fluid culture     Status: None (Preliminary result)   Collection Time: 12/22/15 11:02 PM  Result Value Ref Range Status   Specimen Description FLUID SYNOVIAL LEFT HIP  Final   Special Requests NONE  Final   Gram Stain   Final     RARE WBC PRESENT, PREDOMINANTLY MONONUCLEAR NO ORGANISMS SEEN    Culture NO GROWTH 2 DAYS  Final   Report Status PENDING  Incomplete  Urine culture     Status: Abnormal   Collection Time: 12/23/15  5:20 AM  Result Value Ref Range Status   Specimen Description URINE, RANDOM  Final   Special Requests ADDED [id number]  Final   Culture MULTIPLE SPECIES PRESENT, SUGGEST RECOLLECTION (A)  Final   Report Status 12/24/2015 FINAL  Final         Radiology Studies: Dg Bone Survey Met  Result Date: 12/23/2015 CLINICAL DATA:  Lytic lesions observed in the left ischium and left greater trochanteric  region on recent pelvic CT scan. EXAM: METASTATIC BONE SURVEY COMPARISON:  Pelvic CT scan of December 15, 2015 FINDINGS: Calvarium:  No lytic or blastic bony lesions are observed. Upper extremities: The patient has undergone previous rotator cuff repair bilaterally. There is a metallic screw present involving the bony glenoid on the right. There is extensive degenerative change of both glenohumeral joints. The patient has undergone plate and screw fixation of a distal left radial fracture. No lytic or blastic lesions within the upper extremities are observed. Spine: There is mild reversal of the normal cervical lordosis. There is degenerative disc disease from C4 inferiorly. The prevertebral soft tissue spaces are grossly normal. There is dextrocurvature of the thoracolumbar spine and the patient has undergone previous posterior fusion from T10 to S1. The thoracic vertebral bodies are reasonably well maintained in height. No significant compression of the lumbar vertebral bodies is observed. There is multilevel degenerative disc disease principally of the lumbar spine. Chest: The lungs are mildly hyperinflated. The heart is top-normal in size. There is tortuosity of the descending thoracic aorta. No rib lesions are observed. Pelvis: A metallic screw traverses the right SI joint as a part of the thoracolumbar  fusion procedure. Mineralization of the bony pelvis appears normal. Lower extremities: There is a well-circumscribed sclerotic marginated lucency in the greater trochanter measuring 17 mm in greatest dimension which likely reflects a benign cystic bony lesion. No lytic or blastic lesions are demonstrated elsewhere in the lower extremities. 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 There are mild degenerative changes of the native left knee joint. IMPRESSION: No definite ischial lesions are observed on today's study. The lucency in the greater trochanter on the left has a sclerotic margin and likely reflects a benign cystic bony lesion. Elsewhere there are significant chronic changes but no findings worrisome for primary or metastatic malignancy including multiple myeloma. Electronically Signed   By: David  Martinique M.D.   On: 12/23/2015 14:38   Dg Fluoro Guided Needle Plc Aspiration/injection Loc  Result Date: 12/22/2015 CLINICAL DATA:  Left hip effusion EXAM: Left HIP ARTHOCENTESIS UNDER FLUOROSCOPY FLUOROSCOPY TIME:  Fluoroscopy Time:  0 minutes, 24 seconds Radiation Exposure Index (if provided by the fluoroscopic device): 113 microGy*m^2 Number of Acquired Spot Images: 0 PROCEDURE: I discussed the risks (including hemorrhage, infection, and allergic reaction, among others), benefits, and alternatives to the procedure with the patient's daughter, who is currently making medical decisions for her. We specifically discussed the high technical likelihood of success of the procedure. The patient understood and elected to undergo the procedure. Standard time-out was employed. Following sterile skin prep and local anesthetic administration consisting of 1% lidocaine, a 20 gauge needle was advanced without difficulty into the left hip joint under fluoroscopic guidance. A total of about 6 cc of a green tinged but otherwise clear fluid was aspirated from the joint. The needle was subsequently removed and the skin cleansed  and bandaged. No immediate complications were observed. IMPRESSION: 1. Technically successful left hip arthrocentesis yielding 6 cc of green tinged but otherwise clear fluid from the left hip. The sample was appropriately labeled and forwarded to the lab for analysis. Electronically Signed   By: Van Clines M.D.   On: 12/22/2015 22:27        Scheduled Meds: . cefTRIAXone (ROCEPHIN)  IV  1 g Intravenous Q24H  . cholecalciferol  5,000 Units Oral BID  . enoxaparin (LOVENOX) injection  40 mg Subcutaneous Q24H  . FLUoxetine  40 mg Oral Daily  .  gabapentin  300 mg Oral 2 times per day  . gabapentin  900 mg Oral QHS  . loratadine  10 mg Oral Daily  . senna-docusate  1 tablet Oral BID   Continuous Infusions:    LOS: 1 day    Time spent: 52mn    PDomenic Polite MD [facility] Pager [phone] If 7PM-7AM, please contact night-coverage [email or web address] Password [email or web address] 12/24/2015, 4:10 PM

## 2015-12-24 NOTE — Evaluation (Signed)
Occupational Therapy Evaluation Patient Details Name: Shirley [name] MRN: [id number] DOB: 01-Jun-1940 Today's Date: 12/24/2015    History of Present Illness 75 y.o. female admitted to [facility] on 12/22/15 for worsening left hip pain.  Arthrodesis did not reveal a spetic joint.  CT scan showed small lucencies in the left ischium and left greater trochanter of uncertain etiology can be seen in multiple myeloma.  Pt with significant PMHx of osteopenia, fibromyalgia, DVT of UE, cervical spondylosis s/p neck surgery, L wrist fx surgeries x 2, spinal fusion, R TKA, and multiple bil foot surgeries.    Clinical Impression   PTA, pt was unable to don/doff socks and to style hair without assistance from family. However she was able to shampoo hair and dress her upper body independently. Additionally, she was utilizing single point cane for community mobility and at times while ambulating in her home. Currently, pt requires min assist for UB ADL secondary to decreased bilateral shoulder ROM from previous injuries and mod assist for LB ADL secondary to hip pain. She required min guard assist for functional mobility. Pt has intermittent assistance from her daughter and granddaughters at home and lives with her husband who is unable to provide all assistance needed due to cognitive deficits from previous strokes. Recommend home health OT post-acute D/C to improve activity tolerance and independence with ADL and IADL. Pt would benefit from continued OT services while admitted to improve independence with ADL. OT will continue to follow acutely.    Follow Up Recommendations  Home health OT;Supervision/Assistance - 24 hour    Equipment Recommendations  None recommended by OT    Recommendations for Other Services       Precautions / Restrictions Precautions Precautions: Fall      Mobility Bed Mobility               General bed mobility comments: Out of bed in chair on OT arrival.  Transfers Overall  transfer level: Needs assistance Equipment used: Rolling walker (2 wheeled) Transfers: Sit to/from Stand Sit to Stand: Min guard              Balance Overall balance assessment: Needs assistance Sitting-balance support: Feet supported;No upper extremity supported Sitting balance-Leahy Scale: Fair     Standing balance support: Single extremity supported;During functional activity Standing balance-Leahy Scale: Poor Standing balance comment: Able to remove single UE from RW while standing at sink to brush teeth                            ADL Overall ADL's : Needs assistance/impaired Eating/Feeding: Set up;Sitting   Grooming: Min guard;Oral care;Standing   Upper Body Bathing: Sitting;Minimal assitance Upper Body Bathing Details (indicate cue type and reason): Min assist due to decreased B shoulder ROM. Lower Body Bathing: Moderate assistance;Sit to/from stand   Upper Body Dressing : Minimal assistance;Sitting Upper Body Dressing Details (indicate cue type and reason): Min assist due to decreased shoulder ROM. Lower Body Dressing: Moderate assistance;Sit to/from stand   Toilet Transfer: Min guard;Ambulation;Regular Toilet;Grab bars;RW   Toileting- Water quality scientist and Hygiene: Min guard;Sit to/from stand       Functional mobility during ADLs: Min guard;Rolling walker General ADL Comments: Educated pt on compensatory strategies for ADL to compensate for decreased shoulder ROM. Educated pt on energy conservation techniques.     Vision Vision Assessment?: No apparent visual deficits   Perception     Praxis      Pertinent Vitals/Pain Pain  Assessment: 0-10 Pain Score: 3  Pain Location: L hip Pain Descriptors / Indicators: Aching Pain Intervention(s): Limited activity within patient's tolerance;Monitored during session;Repositioned;Heat applied     Hand Dominance Right   Extremity/Trunk Assessment Upper Extremity Assessment Upper Extremity  Assessment: RUE deficits/detail;LUE deficits/detail RUE Deficits / Details: Decreased ROM at baseline due to previous surgery. Pt able to reach approximately 120 degrees. LUE Deficits / Details: Decreased ROM from previous surgery. Pt able to reach approximately 100 degrees   Lower Extremity Assessment Lower Extremity Assessment: Generalized weakness   Cervical / Trunk Assessment Cervical / Trunk Assessment: Other exceptions Cervical / Trunk Exceptions: Pt with significant amount of scoliosis in her spine.    Communication Communication Communication: No difficulties   Cognition Arousal/Alertness: Awake/alert Behavior During Therapy: WFL for tasks assessed/performed Overall Cognitive Status: Within Functional Limits for tasks assessed                     General Comments       Exercises       Shoulder Instructions      Home Living Family/patient expects to be discharged to:: Private residence Living Arrangements: Spouse/significant other;Other (Comment) (husband has cognitive deficits from 2 previous strokes) Available Help at Discharge: Family;Available PRN/intermittently Type of Home: House Home Access: Stairs to enter [employer] of Steps: 2 Entrance Stairs-Rails: Right;Left;Can reach both Home Layout: One level     Bathroom Shower/Tub: Occupational psychologist: Standard     Home Equipment: Cane - single point;Bedside commode;Walker - standard;Shower seat - built in;Hand held shower head          Prior Functioning/Environment Level of Independence: Needs assistance  Gait / Transfers Assistance Needed: used cane at times for community mobility ADL's / Homemaking Assistance Needed: Required assistance for styling hair (able to shampoo independently with modifications) due to previous shoulder injuries/surgeries. Required assistance to don/doff socks.            OT Problem List: Decreased strength;Decreased range of motion;Decreased  activity tolerance;Impaired balance (sitting and/or standing);Pain   OT Treatment/Interventions: Self-care/ADL training;Therapeutic exercise;Therapeutic activities;Patient/family education;Balance training    OT Goals(Current goals can be found in the care plan section) Acute Rehab OT Goals Patient Stated Goal: to get her hip to feel better, get back to only using a cane when outside of the house OT Goal Formulation: With patient Time For Goal Achievement: 01/07/16 Potential to Achieve Goals: Good ADL Goals Pt Will Perform Upper Body Bathing: sitting;with modified independence Pt Will Perform Lower Body Bathing: with adaptive equipment;sit to/from stand;with modified independence Pt Will Perform Upper Body Dressing: sitting;with modified independence Pt Will Perform Lower Body Dressing: with adaptive equipment;sit to/from stand;with modified independence Pt Will Transfer to Toilet: with modified independence;regular height toilet;ambulating Pt Will Perform Toileting - Clothing Manipulation and hygiene: with modified independence;sit to/from stand Pt Will Perform Tub/Shower Transfer: with supervision;shower seat;rolling walker;ambulating  OT Frequency: Min 2X/week   Barriers to D/C:            Co-evaluation              End of Session Equipment Utilized During Treatment: Gait belt;Rolling walker  Activity Tolerance:   Patient left:     Time: 1660-6301 OT Time Calculation (min): 30 min Charges:  OT General Charges $OT Visit: 1 Procedure OT Evaluation $OT Eval Moderate Complexity: 1 Procedure OT Treatments $Self Care/Home Management : 8-22 mins  Norman Herrlich, OTR/L [phone] 12/24/2015, 8:34 AM

## 2015-12-25 MED ORDER — CEPHALEXIN 500 MG PO CAPS: 500.0000 mg | ORAL_CAPSULE | Freq: Three times a day (TID) | ORAL | 0 refills | Status: DC

## 2015-12-25 MED ORDER — AMLODIPINE BESYLATE 10 MG PO TABS: 10.0000 mg | ORAL_TABLET | Freq: Every day | ORAL | 0 refills | Status: DC

## 2015-12-25 MED ORDER — HYDRALAZINE HCL 20 MG/ML IJ SOLN
10.0000 mg | Freq: Once | INTRAMUSCULAR | Status: AC
  Administered 2015-12-25: 10 mg via INTRAVENOUS
  Filled 2015-12-25: qty 1

## 2015-12-25 MED ORDER — POLYETHYLENE GLYCOL 3350 17 G PO PACK: 17.0000 g | PACK | Freq: Every day | ORAL | 0 refills | Status: DC | PRN

## 2015-12-25 MED ORDER — AMLODIPINE BESYLATE 10 MG PO TABS
10.0000 mg | ORAL_TABLET | Freq: Every day | ORAL | Status: DC
  Administered 2015-12-25: 10 mg via ORAL
  Filled 2015-12-25: qty 1

## 2015-12-25 NOTE — Progress Notes (Signed)
Discharge instructions and prescriptions reviewed with patient. Patient states understanding. IV removed. Family here for transportation Product manager, Therapist, sports

## 2015-12-25 NOTE — Progress Notes (Addendum)
Occupational Therapy Treatment/Discharge Patient Details Name: Shirley [name] MRN: [id number] DOB: 06-11-1940 Today's Date: 12/25/2015    History of present illness 75 y.o. female admitted to [facility] on 12/22/15 for worsening left hip pain.  Arthrodesis did not reveal a spetic joint.  CT scan showed small lucencies in the left ischium and left greater trochanter of uncertain etiology can be seen in multiple myeloma.  Pt with significant PMHx of osteopenia, fibromyalgia, DVT of UE, cervical spondylosis s/p neck surgery, L wrist fx surgeries x 2, spinal fusion, R TKA, and multiple bil foot surgeries.    OT comments  Pt progressing well toward OT goals. Pt able to complete LB ADL with min assist and functional mobility with supervision for safety. Educated pt on benefits of AE to improve independence with LB ADL, but pt reports that she would prefer for family to assist with this post-acute D/C. Completed education concerning safe use of DME, safe shower transfers, energy conservation, and fall prevention and pt verbalizes and demonstrates understanding. Pt reports that she will be moving soon and is unsure of new home set-up. Recommended that pt discuss with home health therapists if new challenges arise due to new home set-up in order to improve safety in home environment. Pt reports no further questions or concerns. Pt has no further acute OT needs. Continue to recommend home health OT for OT follow-up post-acute D/C along with 24 hour assistance/supervision from family. Acute OT will sign off.   Follow Up Recommendations  Home health OT;Supervision/Assistance - 24 hour    Equipment Recommendations  None recommended by OT       Precautions / Restrictions Precautions Precautions: Fall Restrictions Weight Bearing Restrictions: No       Mobility Bed Mobility               General bed mobility comments: Pt received in recliner.  Transfers Overall transfer level: Needs assistance Equipment  used: Rolling walker (2 wheeled) Transfers: Sit to/from Stand Sit to Stand: Supervision         General transfer comment: Supervision for safety.    Balance Overall balance assessment: Needs assistance Sitting-balance support: No upper extremity supported;Feet supported Sitting balance-Leahy Scale: Good     Standing balance support: Single extremity supported;During functional activity Standing balance-Leahy Scale: Fair                     ADL Overall ADL's : Needs assistance/impaired                     Lower Body Dressing: Minimal assistance;Sit to/from stand   Toilet Transfer: Supervision/safety;Ambulation;RW;BSC Toilet Transfer Details (indicate cue type and reason): Simulated with sit<>stand with ambulation     Tub/ Shower Transfer: Min guard;Cueing for sequencing;Shower seat;Ambulation;Rolling walker;Walk-in shower   Functional mobility during ADLs: Supervision/safety;Min guard;Rolling walker (Supervision for ambulation, min guard shower transfer.) General ADL Comments: Educated pt on dressing techniques, benefits of AE for LB dressing, energy conservation, fall prevention, and safe shower transfer.                Cognition   Behavior During Therapy: WFL for tasks assessed/performed Overall Cognitive Status: Within Functional Limits for tasks assessed                                    Pertinent Vitals/ Pain       Pain Assessment: 0-10 Pain Score: 6  Pain Location: L hip Pain Descriptors / Indicators: Aching;Sore Pain Intervention(s): Monitored during session;RN gave pain meds during session;Heat applied;Limited activity within patient's tolerance         Frequency  Min 2X/week        Progress Toward Goals  OT Goals(current goals can now be found in the care plan section)     Acute Rehab OT Goals Patient Stated Goal: to get her hip to feel better, get back to only using a cane when outside of the house OT Goal  Formulation: With patient Time For Goal Achievement: 01/07/16 Potential to Achieve Goals: Good ADL Goals Pt Will Perform Upper Body Bathing: sitting;with modified independence Pt Will Perform Lower Body Bathing: with adaptive equipment;sit to/from stand;with modified independence Pt Will Perform Upper Body Dressing: sitting;with modified independence Pt Will Perform Lower Body Dressing: with adaptive equipment;sit to/from stand;with modified independence Pt Will Transfer to Toilet: with modified independence;regular height toilet;ambulating Pt Will Perform Toileting - Clothing Manipulation and hygiene: with modified independence;sit to/from stand Pt Will Perform Tub/Shower Transfer: with supervision;shower seat;rolling walker;ambulating  Plan Discharge plan remains appropriate       End of Session Equipment Utilized During Treatment: Gait belt;Rolling walker   Activity Tolerance Patient tolerated treatment well   Patient Left in chair;with call bell/phone within reach           Time: 0758-0825 OT Time Calculation (min): 27 min  Charges: OT General Charges $OT Visit: 1 Procedure OT Treatments $Self Care/Home Management : 23-37 mins  Norman Herrlich, OTR/L [phone] 12/25/2015, 9:22 AM

## 2015-12-25 NOTE — Care Management Note (Signed)
Case Management Note  Patient Details  Name: SWANNIE MILIUS MRN: [id number] Date of Birth: Aug 29, 1940  Subjective/Objective:   75 yr old female admitted with septic left hip,patient had a left hip aspiration.                 Action/Plan: Case manager spoke with patient concerning [place] and DME needs. Choice was offered for Home Health agency, CM called referral to [place] [facility]iaison.Case Manager ordered RW. Patient will have family support at discharge.   Expected Discharge Date:  12/24/16           Expected Discharge Plan:  [place]  In-House Referral:     Discharge planning Services  CM Consult  Post Acute Care Choice:  Home Health, Durable Medical Equipment Choice offered to:  Patient  DME Arranged:  Walker rolling DME Agency:  [place] Arranged:  PT, OT [facility] Agency:  [place]  Status of Service:  Completed, signed off  If discussed at [place] of Stay Meetings, dates discussed:    Additional Comments:  Ninfa Meeker, RN 12/25/2015, 1:55 PM

## 2015-12-25 NOTE — Discharge Summary (Signed)
Physician Discharge Summary  Shirley [name] VQQ:[id number] DOB: [birth year] DOA: 12/22/2015  PCP: Ardith Dark, PA-C  Admit date: 12/22/2015 Discharge date: 12/25/2015  Time spent: 45 minutes  Recommendations for Outpatient Follow-up:  PCP Ardith Dark PA in 1 week Orthopedic surgeon Dr.Cohen in [place]  Discharge Diagnoses:  Active Problems:   Acute pain of left hip   Effusion of left hip   Lytic bone lesions on xray   UTI (urinary tract infection)   DNR (do not resuscitate) discussion   Chronic pain syndrome   Left hip pain   Discharge Condition: Stable  Diet recommendation: regular  Filed Weights   12/22/15 1548 12/22/15 2118  Weight: 78.9 kg (174 lb) 78.9 kg (174 lb)    History of present illness:  75 year old female with chronic pain, osteopenia presented with 2 week history of worsening Pain. Described as constant, worse with movement, better with home narcotics and she has been increasing dose frequency x 2days. CT of the pelvis prior to admission showed no acute injury, left hip joint effusion, and small lucencies in the left ischium and left greater trochanter of uncertain etiology can be seen in multiple myeloma. EDP d/w Dr.Cohen her spine surgeon and she underwent arthrocentesis in ED prior to admission  Hospital Course:  1. L Hip Pain -acute on chronic -L hip effusion, s/p arthrocentesis, no evidence of infection -Bone scan suggested that the lesions are likely bony cysts  -pt and daughter now report that they feel that her pain is at baseline now and are not interested in pursuing an MRI under anesthesia at this time -continue Oxycodone 17m Q4H PRN per home regimen -Home PT set up at discharge -SPEP and Immunofixation normal-not suggestive of myeloma  2. UTI -treated with ceftriaxone -cultures negative, changed to Po keflex at discharge  3. Chronic pain syndrome H/o 23 orthopedic surgeries -followed by Pain clinic -continue oxycodone per  home regimen  4. Chronic venous stasis -stable, improved with supportive care   Discharge Exam: Vitals:   12/25/15 0507 12/25/15 0643  BP: (!) 215/85 (!) 181/65  Pulse:    Resp:    Temp:      General: AAOx3 Cardiovascular:S1S2/RRR Respiratory: CTAB  Discharge Instructions   Discharge Instructions    Diet - low sodium heart healthy    Complete by:  As directed    Increase activity slowly    Complete by:  As directed      Discharge Medication List as of 12/25/2015 12:55 PM    START taking these medications   Details  amLODipine (NORVASC) 10 MG tablet Take 1 tablet (10 mg total) by mouth daily., Starting Fri 12/26/2015, Print    cephALEXin (KEFLEX) 500 MG capsule Take 1 capsule (500 mg total) by mouth 3 (three) times daily. For 2days, Starting Thu 12/25/2015, Print    polyethylene glycol (MIRALAX / GLYCOLAX) packet Take 17 g by mouth daily as needed for moderate constipation., Starting Thu 12/25/2015, Print      CONTINUE these medications which have NOT CHANGED   Details  ALPRAZolam (XANAX) 1 MG tablet Take 0.5-1 mg by mouth 3 (three) times daily as needed for anxiety or sleep. , Historical Med    amphetamine-dextroamphetamine (ADDERALL) 20 MG tablet Take 20 mg by mouth daily as needed (ADHD). , Starting Tue 05/13/2015, Until Mon 12/22/2015, Historical Med    cetirizine (ZYRTEC) 10 MG tablet Take 10 mg by mouth daily as needed for allergies. , Starting Mon 06/24/2014, Historical Med    Cholecalciferol (VITAMIN D3) 5000  UNITS CAPS Take 5,000 Units by mouth 2 (two) times daily. , Historical Med    FLUoxetine (PROZAC) 40 MG capsule Take 40 mg by mouth daily., Historical Med    gabapentin (NEURONTIN) 300 MG capsule Take 300-900 mg by mouth 3 (three) times daily. Take 397m in the am and afternoon, and then take 9063mat night, Historical Med    ibuprofen (ADVIL,MOTRIN) 200 MG tablet Take 800 mg by mouth every 6 (six) hours as needed for moderate pain., Historical Med     oxycodone (ROXICODONE) 30 MG immediate release tablet Take 30 mg by mouth every 4 (four) hours as needed for pain (for pain). Reported on 06/03/2015, Historical Med    torsemide (DEMADEX) 10 MG tablet Take 10 mg by mouth daily. And may take an extra dose if needed for fluid/edema, Starting Mon 01/13/2015, Historical Med       No Known Allergies Follow-up Information    HEDGECOCK,SUZANNE, PA-C. Schedule an appointment as soon as possible for a visit in 1 week(s).   Specialty:  Physician Assistant Contact information: [address] [address] [phone]      MAStarling MannsMD. Schedule an appointment as soon as possible for a visit in 2 week(s).   Specialty:  Orthopedic Surgery Contact information: [id number] [place]   Why:  Someone from [facility] contact you to arrange start date and time for therapy. Contact information: [address] [phone]          The results of significant diagnostics from this hospitalization (including imaging, microbiology, ancillary and laboratory) are listed below for reference.    Significant Diagnostic Studies: Ct Pelvis Wo Contrast  Result Date: 12/15/2015 CLINICAL DATA:  Left groin pain for 2 months.  Prior back surgery. EXAM: CT PELVIS WITHOUT CONTRAST TECHNIQUE: Multidetector CT imaging of the pelvis was performed following the standard protocol without intravenous contrast. COMPARISON:  None. FINDINGS: Bones/Joint/Cartilage No fracture or dislocation. Normal alignment. Left hip joint effusion. Mild degenerative changes of bilateral sacroiliac joints and pubic symphysis. Partially visualized is posterior lumbar interbody fusion at L4-5. Posterior lumbar fusion at L5-S1. Grade 1 anterolisthesis of L5 on S1. 15 mm lucency in the left ischium. Second 10 mm lucency in the left  ischium. 11 mm lucency in the left greater trochanter. Ligaments Ligaments are suboptimally evaluated by CT. Muscles and Tendons Atrophy of the peripheral left gluteus medius and maximus muscles. Mild muscle atrophy of the left gluteus minimus. Soft tissue No fluid collection or hematoma.  No soft tissue mass. IMPRESSION: 1. No acute osseous injury of the pelvis. 2. Left hip joint effusion. If there is concern regarding septic arthritis, recommend arthrocentesis. 3. Small lucencies in the left ischium and left greater trochanter of uncertain etiology. This appearance can be seen with multiple myeloma. Correlate with laboratory values. Electronically Signed   By: HeKathreen Devoid On: 12/15/2015 12:00   Dg Bone Survey Met  Result Date: 12/23/2015 CLINICAL DATA:  Lytic lesions observed in the left ischium and left greater trochanteric region on recent pelvic CT scan. EXAM: METASTATIC BONE SURVEY COMPARISON:  Pelvic CT scan of December 15, 2015 FINDINGS: Calvarium:  No lytic or blastic bony lesions are observed. Upper extremities: The patient has undergone previous rotator cuff repair bilaterally. There is a metallic screw present involving the bony  glenoid on the right. There is extensive degenerative change of both glenohumeral joints. The patient has undergone plate and screw fixation of a distal left radial fracture. No lytic or blastic lesions within the upper extremities are observed. Spine: There is mild reversal of the normal cervical lordosis. There is degenerative disc disease from C4 inferiorly. The prevertebral soft tissue spaces are grossly normal. There is dextrocurvature of the thoracolumbar spine and the patient has undergone previous posterior fusion from T10 to S1. The thoracic vertebral bodies are reasonably well maintained in height. No significant compression of the lumbar vertebral bodies is observed. There is multilevel degenerative disc disease principally of the lumbar spine. Chest: The lungs  are mildly hyperinflated. The heart is top-normal in size. There is tortuosity of the descending thoracic aorta. No rib lesions are observed. Pelvis: A metallic screw traverses the right SI joint as a part of the thoracolumbar fusion procedure. Mineralization of the bony pelvis appears normal. Lower extremities: There is a well-circumscribed sclerotic marginated lucency in the greater trochanter measuring 17 mm in greatest dimension which likely reflects a benign cystic bony lesion. No lytic or blastic lesions are demonstrated elsewhere in the lower extremities. 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 There are mild degenerative changes of the native left knee joint. IMPRESSION: No definite ischial lesions are observed on today's study. The lucency in the greater trochanter on the left has a sclerotic margin and likely reflects a benign cystic bony lesion. Elsewhere there are significant chronic changes but no findings worrisome for primary or metastatic malignancy including multiple myeloma. Electronically Signed   By: David  Martinique M.D.   On: 12/23/2015 14:38   Dg Fluoro Guided Needle Plc Aspiration/injection Loc  Result Date: 12/22/2015 CLINICAL DATA:  Left hip effusion EXAM: Left HIP ARTHOCENTESIS UNDER FLUOROSCOPY FLUOROSCOPY TIME:  Fluoroscopy Time:  0 minutes, 24 seconds Radiation Exposure Index (if provided by the fluoroscopic device): 113 microGy*m^2 Number of Acquired Spot Images: 0 PROCEDURE: I discussed the risks (including hemorrhage, infection, and allergic reaction, among others), benefits, and alternatives to the procedure with the patient's daughter, who is currently making medical decisions for her. We specifically discussed the high technical likelihood of success of the procedure. The patient understood and elected to undergo the procedure. Standard time-out was employed. Following sterile skin prep and local anesthetic administration consisting of 1% lidocaine, a 20 gauge needle was  advanced without difficulty into the left hip joint under fluoroscopic guidance. A total of about 6 cc of a green tinged but otherwise clear fluid was aspirated from the joint. The needle was subsequently removed and the skin cleansed and bandaged. No immediate complications were observed. IMPRESSION: 1. Technically successful left hip arthrocentesis yielding 6 cc of green tinged but otherwise clear fluid from the left hip. The sample was appropriately labeled and forwarded to the lab for analysis. Electronically Signed   By: Van Clines M.D.   On: 12/22/2015 22:27    Microbiology: Recent Results (from the past 240 hour(s))  Blood culture (routine x 2)     Status: None (Preliminary result)   Collection Time: 12/22/15  5:36 PM  Result Value Ref Range Status   Specimen Description BLOOD RIGHT ANTECUBITAL  Final   Special Requests IN PEDIATRIC BOTTLE 3CC  Final   Culture   Final    NO GROWTH 3 DAYS Performed at [facility]    Report Status PENDING  Incomplete  Blood culture (routine x 2)     Status: None (Preliminary  result)   Collection Time: 12/22/15  6:03 PM  Result Value Ref Range Status   Specimen Description BLOOD RIGHT WRIST  Final   Special Requests BOTTLES DRAWN AEROBIC AND ANAEROBIC 5CC  Final   Culture   Final    NO GROWTH 3 DAYS Performed at [facility]    Report Status PENDING  Incomplete  Body fluid culture     Status: None (Preliminary result)   Collection Time: 12/22/15 11:02 PM  Result Value Ref Range Status   Specimen Description FLUID SYNOVIAL LEFT HIP  Final   Special Requests NONE  Final   Gram Stain   Final    RARE WBC PRESENT, PREDOMINANTLY MONONUCLEAR NO ORGANISMS SEEN    Culture NO GROWTH 2 DAYS  Final   Report Status PENDING  Incomplete  Urine culture     Status: Abnormal   Collection Time: 12/23/15  5:20 AM  Result Value Ref Range Status   Specimen Description URINE, RANDOM  Final   Special Requests ADDED [id number]  Final    Culture MULTIPLE SPECIES PRESENT, SUGGEST RECOLLECTION (A)  Final   Report Status 12/24/2015 FINAL  Final     Labs: Basic Metabolic Panel:  Recent Labs Lab 12/22/15 1736 12/24/15 0608  NA 138 139  K 4.8 4.1  CL 108 106  CO2 23 26  GLUCOSE 103* 97  BUN 32* 16  CREATININE 1.22* 0.96  CALCIUM 8.7* 8.8*   Liver Function Tests:  Recent Labs Lab 12/23/15 0154  AST 16  ALT 11*  ALKPHOS 85  BILITOT 0.5  PROT 5.8*  ALBUMIN 3.4*   No results for input(s): LIPASE, AMYLASE in the last 168 hours. No results for input(s): AMMONIA in the last 168 hours. CBC:  Recent Labs Lab 12/22/15 1736 12/24/15 0608  WBC 6.6 6.7  NEUTROABS 4.6  --   HGB 10.0* 10.0*  HCT 32.4* 31.9*  MCV 92.3 89.6  PLT 627* 558*   Cardiac Enzymes: No results for input(s): CKTOTAL, CKMB, CKMBINDEX, TROPONINI in the last 168 hours. BNP: BNP (last 3 results) No results for input(s): BNP in the last 8760 hours.  ProBNP (last 3 results) No results for input(s): PROBNP in the last 8760 hours.  CBG: No results for input(s): GLUCAP in the last 168 hours.     SignedDomenic Polite MD.  [facility] 12/25/2015, 3:40 PM

## 2015-12-26 DIAGNOSIS — I872 Venous insufficiency (chronic) (peripheral): Secondary | ICD-10-CM | POA: Diagnosis not present

## 2015-12-26 DIAGNOSIS — M25559 Pain in unspecified hip: Secondary | ICD-10-CM | POA: Diagnosis not present

## 2015-12-26 DIAGNOSIS — G894 Chronic pain syndrome: Secondary | ICD-10-CM | POA: Diagnosis not present

## 2015-12-26 DIAGNOSIS — Z96651 Presence of right artificial knee joint: Secondary | ICD-10-CM | POA: Diagnosis not present

## 2015-12-26 DIAGNOSIS — I10 Essential (primary) hypertension: Secondary | ICD-10-CM | POA: Diagnosis not present

## 2015-12-26 LAB — BODY FLUID CULTURE: CULTURE: NO GROWTH

## 2015-12-27 LAB — CULTURE, BLOOD (ROUTINE X 2)
CULTURE: NO GROWTH
CULTURE: NO GROWTH

## 2016-01-01 DIAGNOSIS — I10 Essential (primary) hypertension: Secondary | ICD-10-CM | POA: Diagnosis not present

## 2016-01-01 DIAGNOSIS — M25559 Pain in unspecified hip: Secondary | ICD-10-CM | POA: Diagnosis not present

## 2016-01-01 DIAGNOSIS — I872 Venous insufficiency (chronic) (peripheral): Secondary | ICD-10-CM | POA: Diagnosis not present

## 2016-01-01 DIAGNOSIS — G894 Chronic pain syndrome: Secondary | ICD-10-CM | POA: Diagnosis not present

## 2016-01-01 DIAGNOSIS — Z96651 Presence of right artificial knee joint: Secondary | ICD-10-CM | POA: Diagnosis not present

## 2016-01-02 DIAGNOSIS — M25559 Pain in unspecified hip: Secondary | ICD-10-CM | POA: Diagnosis not present

## 2016-01-02 DIAGNOSIS — I872 Venous insufficiency (chronic) (peripheral): Secondary | ICD-10-CM | POA: Diagnosis not present

## 2016-01-02 DIAGNOSIS — Z96651 Presence of right artificial knee joint: Secondary | ICD-10-CM | POA: Diagnosis not present

## 2016-01-02 DIAGNOSIS — G894 Chronic pain syndrome: Secondary | ICD-10-CM | POA: Diagnosis not present

## 2016-01-02 DIAGNOSIS — I10 Essential (primary) hypertension: Secondary | ICD-10-CM | POA: Diagnosis not present

## 2016-01-05 ENCOUNTER — Encounter (HOSPITAL_COMMUNITY): Payer: Self-pay

## 2016-01-05 ENCOUNTER — Inpatient Hospital Stay (HOSPITAL_COMMUNITY)
Admission: RE | Admit: 2016-01-05 | Discharge: 2016-01-05 | Disposition: A | Discharge status: Home / Self Care | Payer: PPO | Source: Ambulatory Visit

## 2016-01-05 DIAGNOSIS — M25552 Pain in left hip: Secondary | ICD-10-CM | POA: Diagnosis not present

## 2016-01-05 DIAGNOSIS — Z87440 Personal history of urinary (tract) infections: Secondary | ICD-10-CM | POA: Diagnosis not present

## 2016-01-05 DIAGNOSIS — M899 Disorder of bone, unspecified: Secondary | ICD-10-CM | POA: Diagnosis not present

## 2016-01-05 HISTORY — DX: Polyneuropathy, unspecified: G62.9

## 2016-01-05 HISTORY — DX: Supraventricular tachycardia: I47.1

## 2016-01-05 HISTORY — DX: Dyspnea, unspecified: R06.00

## 2016-01-05 HISTORY — DX: Other supraventricular tachycardia: I47.19

## 2016-01-05 HISTORY — DX: Headache, unspecified: R51.9

## 2016-01-05 HISTORY — DX: Essential (primary) hypertension: I10

## 2016-01-05 HISTORY — DX: Headache: R51

## 2016-01-05 NOTE — Progress Notes (Signed)
Mrs. Verrastro denies chest pain or palpations, or shortness or breath at rest.  Dr Ellyn Hack is patient's cardiologist  (history of PAT, palpations) and was seen in April of 2017.

## 2016-01-06 ENCOUNTER — Ambulatory Visit (HOSPITAL_COMMUNITY)
Admission: RE | Admit: 2016-01-06 | Discharge: 2016-01-06 | Disposition: A | Discharge status: Home / Self Care | Payer: PPO | Source: Ambulatory Visit | Attending: Orthopaedic Surgery | Admitting: Orthopaedic Surgery

## 2016-01-06 ENCOUNTER — Encounter (HOSPITAL_COMMUNITY)
Admission: RE | Disposition: A | Discharge status: Home / Self Care | Payer: Self-pay | Source: Ambulatory Visit | Attending: Orthopaedic Surgery

## 2016-01-06 ENCOUNTER — Ambulatory Visit (HOSPITAL_COMMUNITY): Payer: PPO | Admitting: Anesthesiology

## 2016-01-06 ENCOUNTER — Encounter (HOSPITAL_COMMUNITY): Payer: Self-pay

## 2016-01-06 ENCOUNTER — Encounter (HOSPITAL_COMMUNITY): Payer: Self-pay | Admitting: *Deleted

## 2016-01-06 DIAGNOSIS — N390 Urinary tract infection, site not specified: Secondary | ICD-10-CM | POA: Diagnosis not present

## 2016-01-06 DIAGNOSIS — M25559 Pain in unspecified hip: Secondary | ICD-10-CM | POA: Diagnosis not present

## 2016-01-06 DIAGNOSIS — R0609 Other forms of dyspnea: Secondary | ICD-10-CM | POA: Diagnosis not present

## 2016-01-06 DIAGNOSIS — S76011A Strain of muscle, fascia and tendon of right hip, initial encounter: Secondary | ICD-10-CM | POA: Diagnosis not present

## 2016-01-06 DIAGNOSIS — G894 Chronic pain syndrome: Secondary | ICD-10-CM | POA: Diagnosis not present

## 2016-01-06 DIAGNOSIS — M25452 Effusion, left hip: Secondary | ICD-10-CM | POA: Diagnosis not present

## 2016-01-06 DIAGNOSIS — M7061 Trochanteric bursitis, right hip: Secondary | ICD-10-CM | POA: Diagnosis not present

## 2016-01-06 DIAGNOSIS — I872 Venous insufficiency (chronic) (peripheral): Secondary | ICD-10-CM | POA: Diagnosis not present

## 2016-01-06 DIAGNOSIS — X58XXXA Exposure to other specified factors, initial encounter: Secondary | ICD-10-CM | POA: Diagnosis not present

## 2016-01-06 DIAGNOSIS — I10 Essential (primary) hypertension: Secondary | ICD-10-CM | POA: Diagnosis not present

## 2016-01-06 DIAGNOSIS — M898X8 Other specified disorders of bone, other site: Secondary | ICD-10-CM | POA: Diagnosis not present

## 2016-01-06 DIAGNOSIS — M1612 Unilateral primary osteoarthritis, left hip: Secondary | ICD-10-CM | POA: Insufficient documentation

## 2016-01-06 DIAGNOSIS — M25552 Pain in left hip: Secondary | ICD-10-CM | POA: Insufficient documentation

## 2016-01-06 DIAGNOSIS — S76312A Strain of muscle, fascia and tendon of the posterior muscle group at thigh level, left thigh, initial encounter: Secondary | ICD-10-CM | POA: Diagnosis not present

## 2016-01-06 DIAGNOSIS — Z96651 Presence of right artificial knee joint: Secondary | ICD-10-CM | POA: Diagnosis not present

## 2016-01-06 HISTORY — PX: RADIOLOGY WITH ANESTHESIA: SHX6223

## 2016-01-06 SURGERY — RADIOLOGY WITH ANESTHESIA
Anesthesia: Monitor Anesthesia Care | Laterality: Left

## 2016-01-06 MED ORDER — GADOBENATE DIMEGLUMINE 529 MG/ML IV SOLN
15.0000 mL | Freq: Once | INTRAVENOUS | Status: AC
  Administered 2016-01-06: 15 mL via INTRAVENOUS

## 2016-01-06 MED ORDER — FENTANYL CITRATE (PF) 100 MCG/2ML IJ SOLN: 25.0000 ug | INTRAMUSCULAR | Status: DC | PRN

## 2016-01-06 MED ORDER — ONDANSETRON HCL 4 MG/2ML IJ SOLN: 4.0000 mg | Freq: Once | INTRAMUSCULAR | Status: DC | PRN

## 2016-01-06 MED ORDER — LACTATED RINGERS IV SOLN: INTRAVENOUS | Status: DC

## 2016-01-06 NOTE — Anesthesia Postprocedure Evaluation (Signed)
Anesthesia Post Note  Patient: Shirley [name]  Procedure(s) Performed: Procedure(s) (LRB): MRI LEFT HIP WITH AND WITHOUT (Left)  Patient location during evaluation: PACU Anesthesia Type: General Level of consciousness: awake, awake and alert and oriented Pain management: pain level controlled Respiratory status: spontaneous breathing, nonlabored ventilation and patient connected to nasal cannula oxygen Cardiovascular status: blood pressure returned to baseline Postop Assessment: no headache Anesthetic complications: no    Last Vitals:  Vitals:   01/06/16 1108 01/06/16 1120  BP:  129/70  Pulse: 62 62  Resp: 18 18  Temp: 36.4 C     Last Pain:  Vitals:   01/06/16 0734  TempSrc: Oral  PainSc:                  Kutter Schnepf COKER

## 2016-01-06 NOTE — Addendum Note (Signed)
Addendum  created 01/06/16 1330 by Roberts Gaudy, MD   Anesthesia Attestations filed

## 2016-01-06 NOTE — Anesthesia Preprocedure Evaluation (Addendum)
Anesthesia Evaluation  Patient identified by MRN, date of birth, ID band Patient awake    Reviewed: Allergy & Precautions, NPO status , Patient's Chart, lab work & pertinent test results  Airway Mallampati: II  TM Distance: >3 FB Neck ROM: Full    Dental  (+) Teeth Intact, Dental Advisory Given   Pulmonary Current Smoker,    breath sounds clear to auscultation       Cardiovascular hypertension,  Rhythm:Regular Rate:Normal     Neuro/Psych    GI/Hepatic   Endo/Other    Renal/GU      Musculoskeletal   Abdominal   Peds  Hematology   Anesthesia Other Findings   Reproductive/Obstetrics                             Anesthesia Physical Anesthesia Plan  ASA: III  Anesthesia Plan: MAC   Post-op Pain Management:    Induction: Intravenous  Airway Management Planned: Natural Airway and Nasal Cannula  Additional Equipment:   Intra-op Plan:   Post-operative Plan:   Informed Consent: I have reviewed the patients History and Physical, chart, labs and discussed the procedure including the risks, benefits and alternatives for the proposed anesthesia with the patient or authorized representative who has indicated his/her understanding and acceptance.   Dental advisory given  Plan Discussed with: CRNA and Anesthesiologist  Anesthesia Plan Comments:         Anesthesia Quick Evaluation  

## 2016-01-06 NOTE — Progress Notes (Signed)
MRI Info faxed to [facility] department.

## 2016-01-06 NOTE — Transfer of Care (Signed)
Immediate Anesthesia Transfer of Care Note  Patient: Shirley [name]  Procedure(s) Performed: Procedure(s): MRI LEFT HIP WITH AND WITHOUT (Left)  Patient Location: PACU  Anesthesia Type:MAC  Level of Consciousness: awake  Airway & Oxygen Therapy: Patient Spontanous Breathing and Patient connected to face mask oxygen  Post-op Assessment: Report given to RN, Post -op Vital signs reviewed and stable and Patient moving all extremities X 4  Post vital signs: Reviewed and stable  Last Vitals:  Vitals:   01/06/16 0734 01/06/16 1017  BP: (!) 164/73 127/62  Pulse: 72 60  Resp: 18 10  Temp: 36.3 C 36.4 C    Last Pain:  Vitals:   01/06/16 0734  TempSrc: Oral  PainSc:       Patients Stated Pain Goal: 2 (68/59/92 3414)  Complications: No apparent anesthesia complications

## 2016-01-07 ENCOUNTER — Encounter (HOSPITAL_COMMUNITY): Payer: Self-pay | Admitting: Radiology

## 2016-01-07 DIAGNOSIS — Z96651 Presence of right artificial knee joint: Secondary | ICD-10-CM | POA: Diagnosis not present

## 2016-01-07 DIAGNOSIS — M25559 Pain in unspecified hip: Secondary | ICD-10-CM | POA: Diagnosis not present

## 2016-01-07 DIAGNOSIS — I10 Essential (primary) hypertension: Secondary | ICD-10-CM | POA: Diagnosis not present

## 2016-01-07 DIAGNOSIS — G894 Chronic pain syndrome: Secondary | ICD-10-CM | POA: Diagnosis not present

## 2016-01-07 DIAGNOSIS — I872 Venous insufficiency (chronic) (peripheral): Secondary | ICD-10-CM | POA: Diagnosis not present

## 2016-01-08 MED FILL — Dexmedetomidine HCl IV Soln 200 MCG/2ML: INTRAVENOUS | Qty: 2 | Status: AC

## 2016-01-08 MED FILL — Lactated Ringer's Solution: INTRAVENOUS | Qty: 1000 | Status: AC

## 2016-01-08 MED FILL — Fentanyl Citrate Preservative Free (PF) Inj 100 MCG/2ML: INTRAMUSCULAR | Qty: 4 | Status: AC

## 2016-01-09 DIAGNOSIS — M4120 Other idiopathic scoliosis, site unspecified: Secondary | ICD-10-CM | POA: Diagnosis not present

## 2016-01-09 DIAGNOSIS — G894 Chronic pain syndrome: Secondary | ICD-10-CM | POA: Diagnosis not present

## 2016-01-09 DIAGNOSIS — M546 Pain in thoracic spine: Secondary | ICD-10-CM | POA: Diagnosis not present

## 2016-01-09 DIAGNOSIS — M25559 Pain in unspecified hip: Secondary | ICD-10-CM | POA: Diagnosis not present

## 2016-01-09 DIAGNOSIS — Z96651 Presence of right artificial knee joint: Secondary | ICD-10-CM | POA: Diagnosis not present

## 2016-01-09 DIAGNOSIS — I872 Venous insufficiency (chronic) (peripheral): Secondary | ICD-10-CM | POA: Diagnosis not present

## 2016-01-09 DIAGNOSIS — I10 Essential (primary) hypertension: Secondary | ICD-10-CM | POA: Diagnosis not present

## 2016-01-13 DIAGNOSIS — I10 Essential (primary) hypertension: Secondary | ICD-10-CM | POA: Diagnosis not present

## 2016-01-13 DIAGNOSIS — M25559 Pain in unspecified hip: Secondary | ICD-10-CM | POA: Diagnosis not present

## 2016-01-13 DIAGNOSIS — I872 Venous insufficiency (chronic) (peripheral): Secondary | ICD-10-CM | POA: Diagnosis not present

## 2016-01-13 DIAGNOSIS — G894 Chronic pain syndrome: Secondary | ICD-10-CM | POA: Diagnosis not present

## 2016-01-13 DIAGNOSIS — Z96651 Presence of right artificial knee joint: Secondary | ICD-10-CM | POA: Diagnosis not present

## 2016-02-09 DIAGNOSIS — M1612 Unilateral primary osteoarthritis, left hip: Secondary | ICD-10-CM | POA: Diagnosis not present

## 2016-02-09 DIAGNOSIS — M21611 Bunion of right foot: Secondary | ICD-10-CM | POA: Diagnosis not present

## 2016-02-10 DIAGNOSIS — M5416 Radiculopathy, lumbar region: Secondary | ICD-10-CM | POA: Diagnosis not present

## 2016-02-10 DIAGNOSIS — M961 Postlaminectomy syndrome, not elsewhere classified: Secondary | ICD-10-CM | POA: Diagnosis not present

## 2016-02-10 DIAGNOSIS — M79671 Pain in right foot: Secondary | ICD-10-CM | POA: Diagnosis not present

## 2016-02-10 DIAGNOSIS — G8929 Other chronic pain: Secondary | ICD-10-CM | POA: Diagnosis not present

## 2016-02-10 DIAGNOSIS — M2550 Pain in unspecified joint: Secondary | ICD-10-CM | POA: Diagnosis not present

## 2016-02-10 DIAGNOSIS — Z79899 Other long term (current) drug therapy: Secondary | ICD-10-CM | POA: Diagnosis not present

## 2016-02-10 DIAGNOSIS — R41840 Attention and concentration deficit: Secondary | ICD-10-CM | POA: Diagnosis not present

## 2016-02-10 DIAGNOSIS — F4321 Adjustment disorder with depressed mood: Secondary | ICD-10-CM | POA: Diagnosis not present

## 2016-03-17 ENCOUNTER — Emergency Department (HOSPITAL_COMMUNITY): Payer: PPO

## 2016-03-17 ENCOUNTER — Encounter (HOSPITAL_COMMUNITY): Payer: Self-pay | Admitting: Emergency Medicine

## 2016-03-17 ENCOUNTER — Inpatient Hospital Stay (HOSPITAL_COMMUNITY)
Admission: EM | Admit: 2016-03-17 | Discharge: 2016-03-24 | DRG: 981 | Disposition: A | Discharge status: Home Health | Payer: PPO | Attending: Nephrology | Admitting: Nephrology

## 2016-03-17 DIAGNOSIS — T39395A Adverse effect of other nonsteroidal anti-inflammatory drugs [NSAID], initial encounter: Secondary | ICD-10-CM | POA: Diagnosis present

## 2016-03-17 DIAGNOSIS — N281 Cyst of kidney, acquired: Secondary | ICD-10-CM | POA: Diagnosis present

## 2016-03-17 DIAGNOSIS — M797 Fibromyalgia: Secondary | ICD-10-CM

## 2016-03-17 DIAGNOSIS — D509 Iron deficiency anemia, unspecified: Secondary | ICD-10-CM | POA: Diagnosis not present

## 2016-03-17 DIAGNOSIS — S52022A Displaced fracture of olecranon process without intraarticular extension of left ulna, initial encounter for closed fracture: Secondary | ICD-10-CM | POA: Diagnosis not present

## 2016-03-17 DIAGNOSIS — K219 Gastro-esophageal reflux disease without esophagitis: Secondary | ICD-10-CM | POA: Diagnosis not present

## 2016-03-17 DIAGNOSIS — T148XXA Other injury of unspecified body region, initial encounter: Secondary | ICD-10-CM

## 2016-03-17 DIAGNOSIS — R410 Disorientation, unspecified: Secondary | ICD-10-CM | POA: Diagnosis not present

## 2016-03-17 DIAGNOSIS — G9341 Metabolic encephalopathy: Secondary | ICD-10-CM | POA: Diagnosis present

## 2016-03-17 DIAGNOSIS — Z803 Family history of malignant neoplasm of breast: Secondary | ICD-10-CM

## 2016-03-17 DIAGNOSIS — I158 Other secondary hypertension: Secondary | ICD-10-CM | POA: Diagnosis not present

## 2016-03-17 DIAGNOSIS — Z9841 Cataract extraction status, right eye: Secondary | ICD-10-CM

## 2016-03-17 DIAGNOSIS — Z8673 Personal history of transient ischemic attack (TIA), and cerebral infarction without residual deficits: Secondary | ICD-10-CM

## 2016-03-17 DIAGNOSIS — W19XXXA Unspecified fall, initial encounter: Secondary | ICD-10-CM

## 2016-03-17 DIAGNOSIS — I517 Cardiomegaly: Secondary | ICD-10-CM | POA: Diagnosis not present

## 2016-03-17 DIAGNOSIS — N179 Acute kidney failure, unspecified: Secondary | ICD-10-CM | POA: Diagnosis not present

## 2016-03-17 DIAGNOSIS — Z981 Arthrodesis status: Secondary | ICD-10-CM

## 2016-03-17 DIAGNOSIS — G894 Chronic pain syndrome: Secondary | ICD-10-CM | POA: Diagnosis present

## 2016-03-17 DIAGNOSIS — R296 Repeated falls: Secondary | ICD-10-CM | POA: Diagnosis present

## 2016-03-17 DIAGNOSIS — Z720 Tobacco use: Secondary | ICD-10-CM

## 2016-03-17 DIAGNOSIS — E559 Vitamin D deficiency, unspecified: Secondary | ICD-10-CM | POA: Diagnosis not present

## 2016-03-17 DIAGNOSIS — D72829 Elevated white blood cell count, unspecified: Secondary | ICD-10-CM

## 2016-03-17 DIAGNOSIS — Z823 Family history of stroke: Secondary | ICD-10-CM

## 2016-03-17 DIAGNOSIS — Y92009 Unspecified place in unspecified non-institutional (private) residence as the place of occurrence of the external cause: Secondary | ICD-10-CM

## 2016-03-17 DIAGNOSIS — D62 Acute posthemorrhagic anemia: Secondary | ICD-10-CM

## 2016-03-17 DIAGNOSIS — Z801 Family history of malignant neoplasm of trachea, bronchus and lung: Secondary | ICD-10-CM

## 2016-03-17 DIAGNOSIS — J45909 Unspecified asthma, uncomplicated: Secondary | ICD-10-CM | POA: Diagnosis not present

## 2016-03-17 DIAGNOSIS — G253 Myoclonus: Secondary | ICD-10-CM | POA: Diagnosis not present

## 2016-03-17 DIAGNOSIS — Z86718 Personal history of other venous thrombosis and embolism: Secondary | ICD-10-CM

## 2016-03-17 DIAGNOSIS — Z96651 Presence of right artificial knee joint: Secondary | ICD-10-CM | POA: Diagnosis present

## 2016-03-17 DIAGNOSIS — G9340 Encephalopathy, unspecified: Secondary | ICD-10-CM

## 2016-03-17 DIAGNOSIS — Z8249 Family history of ischemic heart disease and other diseases of the circulatory system: Secondary | ICD-10-CM

## 2016-03-17 DIAGNOSIS — G629 Polyneuropathy, unspecified: Secondary | ICD-10-CM | POA: Diagnosis not present

## 2016-03-17 DIAGNOSIS — E669 Obesity, unspecified: Secondary | ICD-10-CM | POA: Diagnosis present

## 2016-03-17 DIAGNOSIS — I739 Peripheral vascular disease, unspecified: Secondary | ICD-10-CM | POA: Diagnosis not present

## 2016-03-17 DIAGNOSIS — I471 Supraventricular tachycardia: Secondary | ICD-10-CM | POA: Diagnosis not present

## 2016-03-17 DIAGNOSIS — R441 Visual hallucinations: Secondary | ICD-10-CM | POA: Diagnosis present

## 2016-03-17 DIAGNOSIS — I639 Cerebral infarction, unspecified: Secondary | ICD-10-CM | POA: Diagnosis present

## 2016-03-17 DIAGNOSIS — E872 Acidosis: Secondary | ICD-10-CM | POA: Diagnosis present

## 2016-03-17 DIAGNOSIS — T07XXXA Unspecified multiple injuries, initial encounter: Secondary | ICD-10-CM

## 2016-03-17 DIAGNOSIS — Z973 Presence of spectacles and contact lenses: Secondary | ICD-10-CM

## 2016-03-17 DIAGNOSIS — Z7982 Long term (current) use of aspirin: Secondary | ICD-10-CM

## 2016-03-17 DIAGNOSIS — N189 Chronic kidney disease, unspecified: Secondary | ICD-10-CM | POA: Diagnosis present

## 2016-03-17 DIAGNOSIS — N390 Urinary tract infection, site not specified: Secondary | ICD-10-CM | POA: Diagnosis not present

## 2016-03-17 DIAGNOSIS — R29702 NIHSS score 2: Secondary | ICD-10-CM | POA: Diagnosis present

## 2016-03-17 DIAGNOSIS — G8918 Other acute postprocedural pain: Secondary | ICD-10-CM

## 2016-03-17 DIAGNOSIS — D75839 Thrombocytosis, unspecified: Secondary | ICD-10-CM | POA: Diagnosis present

## 2016-03-17 DIAGNOSIS — M549 Dorsalgia, unspecified: Secondary | ICD-10-CM | POA: Diagnosis present

## 2016-03-17 DIAGNOSIS — Z791 Long term (current) use of non-steroidal anti-inflammatories (NSAID): Secondary | ICD-10-CM

## 2016-03-17 DIAGNOSIS — D473 Essential (hemorrhagic) thrombocythemia: Secondary | ICD-10-CM | POA: Diagnosis not present

## 2016-03-17 DIAGNOSIS — F329 Major depressive disorder, single episode, unspecified: Secondary | ICD-10-CM | POA: Diagnosis present

## 2016-03-17 DIAGNOSIS — S42402A Unspecified fracture of lower end of left humerus, initial encounter for closed fracture: Secondary | ICD-10-CM | POA: Diagnosis not present

## 2016-03-17 DIAGNOSIS — Z90710 Acquired absence of both cervix and uterus: Secondary | ICD-10-CM

## 2016-03-17 DIAGNOSIS — Z6830 Body mass index (BMI) 30.0-30.9, adult: Secondary | ICD-10-CM

## 2016-03-17 DIAGNOSIS — F17200 Nicotine dependence, unspecified, uncomplicated: Secondary | ICD-10-CM | POA: Diagnosis not present

## 2016-03-17 DIAGNOSIS — D649 Anemia, unspecified: Secondary | ICD-10-CM

## 2016-03-17 DIAGNOSIS — Z9842 Cataract extraction status, left eye: Secondary | ICD-10-CM

## 2016-03-17 DIAGNOSIS — R739 Hyperglycemia, unspecified: Secondary | ICD-10-CM

## 2016-03-17 DIAGNOSIS — Z79891 Long term (current) use of opiate analgesic: Secondary | ICD-10-CM

## 2016-03-17 DIAGNOSIS — Z79899 Other long term (current) drug therapy: Secondary | ICD-10-CM

## 2016-03-17 DIAGNOSIS — Z833 Family history of diabetes mellitus: Secondary | ICD-10-CM

## 2016-03-17 DIAGNOSIS — M1990 Unspecified osteoarthritis, unspecified site: Secondary | ICD-10-CM | POA: Diagnosis present

## 2016-03-17 DIAGNOSIS — Z8262 Family history of osteoporosis: Secondary | ICD-10-CM

## 2016-03-17 HISTORY — DX: Displaced fracture of olecranon process without intraarticular extension of left ulna, initial encounter for closed fracture: S52.022A

## 2016-03-17 LAB — COMPREHENSIVE METABOLIC PANEL
ALBUMIN: 4.2 g/dL (ref 3.5–5.0)
ALT: 14 U/L (ref 14–54)
AST: 25 U/L (ref 15–41)
Alkaline Phosphatase: 80 U/L (ref 38–126)
Anion gap: 18 — ABNORMAL HIGH (ref 5–15)
BILIRUBIN TOTAL: 0.5 mg/dL (ref 0.3–1.2)
BUN: 82 mg/dL — AB (ref 6–20)
CHLORIDE: 100 mmol/L — AB (ref 101–111)
CO2: 17 mmol/L — ABNORMAL LOW (ref 22–32)
CREATININE: 6.42 mg/dL — AB (ref 0.44–1.00)
Calcium: 9.1 mg/dL (ref 8.9–10.3)
GFR calc Af Amer: 6 mL/min — ABNORMAL LOW (ref >60)
GFR, EST NON AFRICAN AMERICAN: 6 mL/min — AB (ref >60)
Glucose, Bld: 170 mg/dL — ABNORMAL HIGH (ref 65–99)
POTASSIUM: 5.1 mmol/L (ref 3.5–5.1)
Sodium: 135 mmol/L (ref 135–145)
Total Protein: 7.1 g/dL (ref 6.5–8.1)

## 2016-03-17 LAB — I-STAT CG4 LACTIC ACID, ED: Lactic Acid, Venous: 1.22 mmol/L (ref 0.5–1.9)

## 2016-03-17 LAB — CBC
HEMATOCRIT: 33.3 % — AB (ref 36.0–46.0)
Hemoglobin: 10.6 g/dL — ABNORMAL LOW (ref 12.0–15.0)
MCH: 27.8 pg (ref 26.0–34.0)
MCHC: 31.8 g/dL (ref 30.0–36.0)
MCV: 87.4 fL (ref 78.0–100.0)
Platelets: 870 10*3/uL — ABNORMAL HIGH (ref 150–400)
RBC: 3.81 MIL/uL — ABNORMAL LOW (ref 3.87–5.11)
RDW: 14.9 % (ref 11.5–15.5)
WBC: 9.2 10*3/uL (ref 4.0–10.5)

## 2016-03-17 NOTE — ED Notes (Signed)
Back from xray

## 2016-03-17 NOTE — ED Triage Notes (Signed)
Patient from home, was LSN last night when she spoke to daughter on phone.  Today, daughter called around 6, mother had fallen multiple times today, having confusion.  Patient with pain and deformity to left forearm and elbow.  Patient is CAOx4, at this time, she seems clearer at this time to daughter.  Patient also having pain in back.  Patient does have a mild twitch, but she is having more exaggerated movements per daughter. No facial droop, no slurred speech, equal hand grips.

## 2016-03-18 ENCOUNTER — Observation Stay (HOSPITAL_COMMUNITY): Payer: PPO

## 2016-03-18 ENCOUNTER — Emergency Department (HOSPITAL_COMMUNITY): Payer: PPO

## 2016-03-18 ENCOUNTER — Inpatient Hospital Stay (HOSPITAL_COMMUNITY): Payer: PPO

## 2016-03-18 ENCOUNTER — Encounter (HOSPITAL_COMMUNITY): Payer: Self-pay | Admitting: Internal Medicine

## 2016-03-18 DIAGNOSIS — K219 Gastro-esophageal reflux disease without esophagitis: Secondary | ICD-10-CM | POA: Diagnosis not present

## 2016-03-18 DIAGNOSIS — N281 Cyst of kidney, acquired: Secondary | ICD-10-CM | POA: Diagnosis not present

## 2016-03-18 DIAGNOSIS — M797 Fibromyalgia: Secondary | ICD-10-CM | POA: Diagnosis not present

## 2016-03-18 DIAGNOSIS — I638 Other cerebral infarction: Secondary | ICD-10-CM | POA: Diagnosis not present

## 2016-03-18 DIAGNOSIS — G9341 Metabolic encephalopathy: Secondary | ICD-10-CM | POA: Diagnosis not present

## 2016-03-18 DIAGNOSIS — I158 Other secondary hypertension: Secondary | ICD-10-CM | POA: Diagnosis not present

## 2016-03-18 DIAGNOSIS — F17200 Nicotine dependence, unspecified, uncomplicated: Secondary | ICD-10-CM | POA: Diagnosis not present

## 2016-03-18 DIAGNOSIS — G9340 Encephalopathy, unspecified: Secondary | ICD-10-CM

## 2016-03-18 DIAGNOSIS — I639 Cerebral infarction, unspecified: Secondary | ICD-10-CM | POA: Diagnosis present

## 2016-03-18 DIAGNOSIS — W19XXXD Unspecified fall, subsequent encounter: Secondary | ICD-10-CM | POA: Diagnosis not present

## 2016-03-18 DIAGNOSIS — E872 Acidosis: Secondary | ICD-10-CM | POA: Diagnosis not present

## 2016-03-18 DIAGNOSIS — D509 Iron deficiency anemia, unspecified: Secondary | ICD-10-CM | POA: Diagnosis not present

## 2016-03-18 DIAGNOSIS — S199XXA Unspecified injury of neck, initial encounter: Secondary | ICD-10-CM | POA: Diagnosis not present

## 2016-03-18 DIAGNOSIS — E559 Vitamin D deficiency, unspecified: Secondary | ICD-10-CM | POA: Diagnosis not present

## 2016-03-18 DIAGNOSIS — R0600 Dyspnea, unspecified: Secondary | ICD-10-CM | POA: Diagnosis not present

## 2016-03-18 DIAGNOSIS — I739 Peripheral vascular disease, unspecified: Secondary | ICD-10-CM | POA: Diagnosis not present

## 2016-03-18 DIAGNOSIS — I480 Paroxysmal atrial fibrillation: Secondary | ICD-10-CM | POA: Diagnosis not present

## 2016-03-18 DIAGNOSIS — T148XXA Other injury of unspecified body region, initial encounter: Secondary | ICD-10-CM | POA: Diagnosis not present

## 2016-03-18 DIAGNOSIS — S42402A Unspecified fracture of lower end of left humerus, initial encounter for closed fracture: Secondary | ICD-10-CM | POA: Insufficient documentation

## 2016-03-18 DIAGNOSIS — N189 Chronic kidney disease, unspecified: Secondary | ICD-10-CM | POA: Diagnosis present

## 2016-03-18 DIAGNOSIS — I517 Cardiomegaly: Secondary | ICD-10-CM | POA: Diagnosis not present

## 2016-03-18 DIAGNOSIS — S3993XA Unspecified injury of pelvis, initial encounter: Secondary | ICD-10-CM | POA: Diagnosis not present

## 2016-03-18 DIAGNOSIS — R4182 Altered mental status, unspecified: Secondary | ICD-10-CM | POA: Diagnosis not present

## 2016-03-18 DIAGNOSIS — N390 Urinary tract infection, site not specified: Secondary | ICD-10-CM | POA: Diagnosis not present

## 2016-03-18 DIAGNOSIS — S52022A Displaced fracture of olecranon process without intraarticular extension of left ulna, initial encounter for closed fracture: Secondary | ICD-10-CM | POA: Diagnosis not present

## 2016-03-18 DIAGNOSIS — S0990XA Unspecified injury of head, initial encounter: Secondary | ICD-10-CM | POA: Diagnosis not present

## 2016-03-18 DIAGNOSIS — R441 Visual hallucinations: Secondary | ICD-10-CM | POA: Diagnosis not present

## 2016-03-18 DIAGNOSIS — G894 Chronic pain syndrome: Secondary | ICD-10-CM | POA: Diagnosis not present

## 2016-03-18 DIAGNOSIS — G629 Polyneuropathy, unspecified: Secondary | ICD-10-CM | POA: Diagnosis not present

## 2016-03-18 DIAGNOSIS — R609 Edema, unspecified: Secondary | ICD-10-CM | POA: Diagnosis not present

## 2016-03-18 DIAGNOSIS — W19XXXA Unspecified fall, initial encounter: Secondary | ICD-10-CM | POA: Diagnosis not present

## 2016-03-18 DIAGNOSIS — S52032A Displaced fracture of olecranon process with intraarticular extension of left ulna, initial encounter for closed fracture: Secondary | ICD-10-CM | POA: Diagnosis not present

## 2016-03-18 DIAGNOSIS — N179 Acute kidney failure, unspecified: Secondary | ICD-10-CM | POA: Diagnosis present

## 2016-03-18 DIAGNOSIS — I471 Supraventricular tachycardia: Secondary | ICD-10-CM | POA: Diagnosis not present

## 2016-03-18 DIAGNOSIS — G253 Myoclonus: Secondary | ICD-10-CM | POA: Diagnosis not present

## 2016-03-18 DIAGNOSIS — R410 Disorientation, unspecified: Secondary | ICD-10-CM | POA: Diagnosis not present

## 2016-03-18 DIAGNOSIS — D473 Essential (hemorrhagic) thrombocythemia: Secondary | ICD-10-CM | POA: Diagnosis not present

## 2016-03-18 DIAGNOSIS — D62 Acute posthemorrhagic anemia: Secondary | ICD-10-CM | POA: Diagnosis not present

## 2016-03-18 DIAGNOSIS — T39395A Adverse effect of other nonsteroidal anti-inflammatory drugs [NSAID], initial encounter: Secondary | ICD-10-CM | POA: Diagnosis not present

## 2016-03-18 DIAGNOSIS — Y92009 Unspecified place in unspecified non-institutional (private) residence as the place of occurrence of the external cause: Secondary | ICD-10-CM | POA: Diagnosis not present

## 2016-03-18 DIAGNOSIS — D649 Anemia, unspecified: Secondary | ICD-10-CM | POA: Diagnosis not present

## 2016-03-18 DIAGNOSIS — J45909 Unspecified asthma, uncomplicated: Secondary | ICD-10-CM | POA: Diagnosis not present

## 2016-03-18 LAB — HEPATIC FUNCTION PANEL
ALBUMIN: 3.1 g/dL — AB (ref 3.5–5.0)
ALK PHOS: 63 U/L (ref 38–126)
ALT: 13 U/L — ABNORMAL LOW (ref 14–54)
AST: 18 U/L (ref 15–41)
BILIRUBIN TOTAL: 0.9 mg/dL (ref 0.3–1.2)
Bilirubin, Direct: <0.1 mg/dL — ABNORMAL LOW (ref 0.1–0.5)
Total Protein: 5.4 g/dL — ABNORMAL LOW (ref 6.5–8.1)

## 2016-03-18 LAB — RAPID URINE DRUG SCREEN, HOSP PERFORMED
Amphetamines: NOT DETECTED
Barbiturates: NOT DETECTED
Benzodiazepines: POSITIVE — AB
Cocaine: NOT DETECTED
Opiates: POSITIVE — AB
Tetrahydrocannabinol: NOT DETECTED

## 2016-03-18 LAB — CBC
HCT: 29 % — ABNORMAL LOW (ref 36.0–46.0)
HEMATOCRIT: 27.1 % — AB (ref 36.0–46.0)
HEMOGLOBIN: 8.5 g/dL — AB (ref 12.0–15.0)
Hemoglobin: 9.2 g/dL — ABNORMAL LOW (ref 12.0–15.0)
MCH: 27.1 pg (ref 26.0–34.0)
MCH: 27.6 pg (ref 26.0–34.0)
MCHC: 31.4 g/dL (ref 30.0–36.0)
MCHC: 31.7 g/dL (ref 30.0–36.0)
MCV: 86.3 fL (ref 78.0–100.0)
MCV: 87.1 fL (ref 78.0–100.0)
PLATELETS: 793 10*3/uL — AB (ref 150–400)
Platelets: 789 10*3/uL — ABNORMAL HIGH (ref 150–400)
RBC: 3.14 MIL/uL — ABNORMAL LOW (ref 3.87–5.11)
RBC: 3.33 MIL/uL — ABNORMAL LOW (ref 3.87–5.11)
RDW: 14.6 % (ref 11.5–15.5)
RDW: 15 % (ref 11.5–15.5)
WBC: 6.9 10*3/uL (ref 4.0–10.5)
WBC: 7.4 10*3/uL (ref 4.0–10.5)

## 2016-03-18 LAB — URINALYSIS, ROUTINE W REFLEX MICROSCOPIC
Bilirubin Urine: NEGATIVE
GLUCOSE, UA: NEGATIVE mg/dL
Ketones, ur: NEGATIVE mg/dL
NITRITE: NEGATIVE
PH: 5 (ref 5.0–8.0)
PROTEIN: NEGATIVE mg/dL
SPECIFIC GRAVITY, URINE: 1.013 (ref 1.005–1.030)

## 2016-03-18 LAB — RETICULOCYTES
RBC.: 3.33 MIL/uL — ABNORMAL LOW (ref 3.87–5.11)
Retic Count, Absolute: 40 10*3/uL (ref 19.0–186.0)
Retic Ct Pct: 1.2 % (ref 0.4–3.1)

## 2016-03-18 LAB — BASIC METABOLIC PANEL
ANION GAP: 16 — AB (ref 5–15)
BUN: 79 mg/dL — ABNORMAL HIGH (ref 6–20)
CHLORIDE: 104 mmol/L (ref 101–111)
CO2: 16 mmol/L — AB (ref 22–32)
CREATININE: 6.05 mg/dL — AB (ref 0.44–1.00)
Calcium: 7.9 mg/dL — ABNORMAL LOW (ref 8.9–10.3)
GFR calc non Af Amer: 6 mL/min — ABNORMAL LOW (ref >60)
GFR, EST AFRICAN AMERICAN: 7 mL/min — AB (ref >60)
Glucose, Bld: 99 mg/dL (ref 65–99)
Potassium: 4.3 mmol/L (ref 3.5–5.1)
Sodium: 136 mmol/L (ref 135–145)

## 2016-03-18 LAB — I-STAT ARTERIAL BLOOD GAS, ED
ACID-BASE DEFICIT: 7 mmol/L — AB (ref 0.0–2.0)
BICARBONATE: 18.3 mmol/L — AB (ref 20.0–28.0)
O2 Saturation: 93 %
PO2 ART: 74 mmHg — AB (ref 83.0–108.0)
Patient temperature: 98.6
TCO2: 19 mmol/L (ref 0–100)
pCO2 arterial: 36 mmHg (ref 32.0–48.0)
pH, Arterial: 7.315 — ABNORMAL LOW (ref 7.350–7.450)

## 2016-03-18 LAB — CREATININE, URINE, RANDOM: Creatinine, Urine: 142.5 mg/dL

## 2016-03-18 LAB — LIPID PANEL
CHOL/HDL RATIO: 2.8 ratio
Cholesterol: 152 mg/dL (ref 0–200)
HDL: 55 mg/dL (ref >40)
LDL Cholesterol: 84 mg/dL (ref 0–99)
Triglycerides: 64 mg/dL (ref <150)
VLDL: 13 mg/dL (ref 0–40)

## 2016-03-18 LAB — VITAMIN B12: Vitamin B-12: 266 pg/mL (ref 180–914)

## 2016-03-18 LAB — IRON AND TIBC
IRON: 20 ug/dL — AB (ref 28–170)
Saturation Ratios: 6 % — ABNORMAL LOW (ref 10.4–31.8)
TIBC: 315 ug/dL (ref 250–450)
UIBC: 295 ug/dL

## 2016-03-18 LAB — CREATININE, SERUM
Creatinine, Ser: 6.06 mg/dL — ABNORMAL HIGH (ref 0.44–1.00)
GFR calc non Af Amer: 6 mL/min — ABNORMAL LOW (ref >60)
GFR, EST AFRICAN AMERICAN: 7 mL/min — AB (ref >60)

## 2016-03-18 LAB — CK: Total CK: 258 U/L — ABNORMAL HIGH (ref 38–234)

## 2016-03-18 LAB — AMMONIA: AMMONIA: 26 umol/L (ref 9–35)

## 2016-03-18 LAB — SODIUM, URINE, RANDOM: SODIUM UR: 24 mmol/L

## 2016-03-18 LAB — FERRITIN: FERRITIN: 73 ng/mL (ref 11–307)

## 2016-03-18 LAB — SALICYLATE LEVEL: Salicylate Lvl: <7 mg/dL (ref 2.8–30.0)

## 2016-03-18 LAB — FOLATE: Folate: 10.1 ng/mL (ref >5.9)

## 2016-03-18 LAB — TSH: TSH: 1.854 u[IU]/mL (ref 0.350–4.500)

## 2016-03-18 LAB — ECHOCARDIOGRAM COMPLETE

## 2016-03-18 MED ORDER — SODIUM CHLORIDE 0.9 % IV BOLUS (SEPSIS)
500.0000 mL | Freq: Once | INTRAVENOUS | Status: AC
  Administered 2016-03-18: 500 mL via INTRAVENOUS

## 2016-03-18 MED ORDER — SODIUM CHLORIDE 0.9 % IV SOLN
INTRAVENOUS | Status: DC
  Administered 2016-03-18: 07:00:00 via INTRAVENOUS

## 2016-03-18 MED ORDER — LORATADINE 10 MG PO TABS
10.0000 mg | ORAL_TABLET | Freq: Every day | ORAL | Status: DC
  Administered 2016-03-18 – 2016-03-24 (×7): 10 mg via ORAL
  Filled 2016-03-18 (×7): qty 1

## 2016-03-18 MED ORDER — HEPARIN SODIUM (PORCINE) 5000 UNIT/ML IJ SOLN: 5000.0000 [IU] | Freq: Three times a day (TID) | INTRAMUSCULAR | Status: DC

## 2016-03-18 MED ORDER — ACETAMINOPHEN 650 MG RE SUPP: 650.0000 mg | RECTAL | Status: DC | PRN

## 2016-03-18 MED ORDER — ACETAMINOPHEN 160 MG/5ML PO SOLN: 650.0000 mg | ORAL | Status: DC | PRN

## 2016-03-18 MED ORDER — ASPIRIN 325 MG PO TABS
325.0000 mg | ORAL_TABLET | Freq: Every day | ORAL | Status: DC
  Administered 2016-03-18 – 2016-03-19 (×2): 325 mg via ORAL
  Filled 2016-03-18 (×2): qty 1

## 2016-03-18 MED ORDER — ONDANSETRON HCL 4 MG PO TABS
4.0000 mg | ORAL_TABLET | Freq: Four times a day (QID) | ORAL | Status: DC | PRN
  Administered 2016-03-23: 4 mg via ORAL
  Filled 2016-03-18: qty 1

## 2016-03-18 MED ORDER — ACETAMINOPHEN 325 MG PO TABS
650.0000 mg | ORAL_TABLET | ORAL | Status: DC | PRN
  Administered 2016-03-21 – 2016-03-23 (×3): 650 mg via ORAL
  Filled 2016-03-18 (×3): qty 2

## 2016-03-18 MED ORDER — STROKE: EARLY STAGES OF RECOVERY BOOK
Freq: Once | Status: DC
  Filled 2016-03-18: qty 1

## 2016-03-18 MED ORDER — ONDANSETRON HCL 4 MG/2ML IJ SOLN
4.0000 mg | Freq: Four times a day (QID) | INTRAMUSCULAR | Status: DC | PRN
  Administered 2016-03-22 – 2016-03-23 (×2): 4 mg via INTRAVENOUS
  Filled 2016-03-18: qty 2

## 2016-03-18 MED ORDER — DEXTROSE 5 % IV SOLN
1.0000 g | Freq: Once | INTRAVENOUS | Status: AC
  Administered 2016-03-18: 1 g via INTRAVENOUS
  Filled 2016-03-18: qty 10

## 2016-03-18 MED ORDER — ASPIRIN 300 MG RE SUPP: 300.0000 mg | Freq: Every day | RECTAL | Status: DC

## 2016-03-18 MED ORDER — ACETAMINOPHEN 325 MG PO TABS: 650.0000 mg | ORAL_TABLET | Freq: Four times a day (QID) | ORAL | Status: DC | PRN

## 2016-03-18 MED ORDER — HEPARIN SODIUM (PORCINE) 5000 UNIT/ML IJ SOLN
5000.0000 [IU] | Freq: Three times a day (TID) | INTRAMUSCULAR | Status: DC
  Administered 2016-03-18 – 2016-03-24 (×16): 5000 [IU] via SUBCUTANEOUS
  Filled 2016-03-18 (×16): qty 1

## 2016-03-18 MED ORDER — ACETAMINOPHEN 650 MG RE SUPP: 650.0000 mg | Freq: Four times a day (QID) | RECTAL | Status: DC | PRN

## 2016-03-18 MED ORDER — GABAPENTIN 600 MG PO TABS
300.0000 mg | ORAL_TABLET | Freq: Every day | ORAL | Status: DC
  Administered 2016-03-18 – 2016-03-23 (×6): 300 mg via ORAL
  Filled 2016-03-18 (×6): qty 1

## 2016-03-18 MED ORDER — ALPRAZOLAM 0.25 MG PO TABS
0.5000 mg | ORAL_TABLET | Freq: Three times a day (TID) | ORAL | Status: DC | PRN
  Administered 2016-03-19 – 2016-03-23 (×8): 0.5 mg via ORAL
  Administered 2016-03-23: 1 mg via ORAL
  Filled 2016-03-18 (×7): qty 2
  Filled 2016-03-18 (×2): qty 4
  Filled 2016-03-18 (×2): qty 2

## 2016-03-18 MED ORDER — CEFTRIAXONE SODIUM 1 G IJ SOLR
1.0000 g | INTRAMUSCULAR | Status: DC
  Administered 2016-03-19 – 2016-03-20 (×2): 1 g via INTRAVENOUS
  Filled 2016-03-18 (×2): qty 10

## 2016-03-18 MED ORDER — OXYCODONE HCL 5 MG PO TABS
5.0000 mg | ORAL_TABLET | Freq: Four times a day (QID) | ORAL | Status: DC | PRN
  Administered 2016-03-18 – 2016-03-23 (×15): 5 mg via ORAL
  Filled 2016-03-18 (×15): qty 1

## 2016-03-18 MED ORDER — AMPHETAMINE-DEXTROAMPHETAMINE 10 MG PO TABS: 20.0000 mg | ORAL_TABLET | Freq: Every day | ORAL | Status: DC | PRN

## 2016-03-18 NOTE — Progress Notes (Deleted)
Orthopedic Tech Progress Note Patient Details:  Shirley [name] [birth year] [id number]  Ortho Devices Type of Ortho Device: Ace wrap, Sugartong splint, Arm sling Ortho Device/Splint Location: lue Ortho Device/Splint Interventions: Ordered, Application   Karolee Stamps 03/18/2016, 3:08 AM

## 2016-03-18 NOTE — ED Notes (Signed)
Ordered meal tray. 

## 2016-03-18 NOTE — ED Notes (Signed)
Patient transported to X-ray 

## 2016-03-18 NOTE — H&P (Addendum)
History and Physical    Shirley [name] YIR:[id number] DOB: June 06, 1940 DOA: 03/17/2016  PCP: Ardith Dark, PA-C  Patient coming from: Home.  History also obtained from patient's daughter and patient.  Chief Complaint: Fall and confusion.  HPI: Shirley [name] is a 76 y.o. female with history of chronic pain and arthritis was recently placed on indomethacin last 2 weeks was brought to the ER after patient was found to be confused and had a fall. As per patient's daughter patient has been having frequent falls last 2 to 3 days. In addition patient is also found to be confused with some myoclonic jerks. In the ER patient's creatinine is found to be around 6 with anion gap of 18. Patient's creatinine in November 2017 was normal. UA does not show any definite casts. Shows possible UTI. Patient denies any nausea vomiting diarrhea. After the fall patient had left elbow pain and x-ray showed left olecranon fracture. CT of the head shows possible subacute occipital infarct. Patient is being admitted for acute encephalopathy with acute renal failure. On exam patient is able to move all extremities has myoclonic jerks with periods of confusion.  ED Course: CT head was showing possible left occipital infarct. CT C-spine was unremarkable. X-ray of the left elbow shows olecranon fracture. Chest x-ray shows cardiomegaly.  Review of Systems: As per HPI, rest all negative.   Past Medical History:  Diagnosis Date  . Allergy    vasomotor rhinitis  . Asthma    h/o asthma and bronchitis  . BV (bacterial vaginosis)    history of frequent  . Cervical spondylosis    herniated disk protruding @ C6-7, impinging cord and left axillary root sleeve.  . Chronic pain   . Depression   . DVT of upper extremity (deep vein thrombosis) ([place])    h/o left(after wrist fracture/surgery); no residual thrombus or phlebitis by Dopplers July 2014  . Dyspnea    with exertion  . Edema   . Fibromyalgia    sees  Dr.Phillips-pain management  . GERD (gastroesophageal reflux disease)    h/o  . Headache    marigraine- hormal - none in years  . Hypertension   . Neuropathy ([place])   . Neuropathy ([place])   . Osteopenia    mild at hip (T-1.3)  . PAT (paroxysmal atrial tachycardia) ([place])   . Varicose veins   . 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 Eilleen Kempf @ [facility]  . Vision problem    wears glasses  . Vitamin D deficiency     Past Surgical History:  Procedure Laterality Date  . ABDOMINAL HYSTERECTOMY  1999   BSO for endometriosis  . APPENDECTOMY    . BACK SURGERY  age 50   ruptured disk L3-4   . CATARACT EXTRACTION  2000   B/L  . COLONOSCOPY    . DOPPLER ECHOCARDIOGRAPHY  July 2012   Normal EF 60-65% , mild concentric LVH. Grade 2 diastolic dysfunction with elevated EDP. Massive left atrial dilation. Pulmonary pressures 30-40 mm Hg; aortic sclerosis but no stenosis. Moderate TR  . FINGER SURGERY     left finger for tender rupture from fall.  Marland Kitchen FOOT SURGERY     multiple(at least 4 on right, 5 on left)-left Dr.Kerner([place])11/08,left Dr.Nunley-excision 2nd and 3rd mt heads  w/artho and hammertoe surgery 4th and 5th 04/2006. left great toe IP fusion and revision of fusion of 2nd through 5th toes 12/2005. Right foot surgeries  Dr.Bednarz 04/2008 and 04/2009.  Marland Kitchen KNEE SURGERY Right    right knee replacement  . NECK SURGERY  2007   cervical  . RADIOLOGY WITH ANESTHESIA Left 01/06/2016   Procedure: MRI LEFT HIP WITH AND WITHOUT;  Surgeon: Medication Radiologist, MD;  Location: [place];  Service: Radiology;  Laterality: Left;  . REFRACTIVE SURGERY  2007   left eye  . SHOULDER SURGERY     x8 (4 on rt side and 4 on left side)  . SPINAL FUSION  6/09   Dr.Cohen  . TONSILLECTOMY AND ADENOIDECTOMY     . WRIST SURGERY Left    2 surgeries left wrist after fracture(complicated by DVT)     reports that she has been smoking.  She has never used smokeless tobacco. She reports that she does not drink alcohol or use drugs.  No Known Allergies  Family History  Problem Relation Age of Onset  . Stroke Mother   . Osteoarthritis Mother     Died at age 70  . Heart disease Father   . Lung cancer Father   . Heart failure Father     Died at age 11  . Osteoporosis Sister   . Diabetes Brother   . Heart attack Brother 34  . Diabetes Brother   . Diabetes Brother   . Heart attack Brother 75  . Breast cancer Paternal Aunt   . Heart failure Maternal Grandmother     Died at age 23, unsure of her heart disease  . Heart failure Paternal Grandfather     Died at age 57, unsure of cardiac disease.    Prior to Admission medications   Medication Sig Start Date End Date Taking? Authorizing Provider  ALPRAZolam Duanne Moron) 1 MG tablet Take 0.5-1 mg by mouth 3 (three) times daily as needed for anxiety or sleep.    Yes Historical Provider, MD  amphetamine-dextroamphetamine (ADDERALL) 20 MG tablet Take 20 mg by mouth daily as needed (ADHD).  05/13/15 03/18/16 Yes Historical Provider, MD  cetirizine (ZYRTEC) 10 MG tablet Take 10 mg by mouth daily as needed for allergies.  06/24/14  Yes Historical Provider, MD  Cholecalciferol (VITAMIN D3) 5000 UNITS CAPS Take 5,000 Units by mouth 2 (two) times daily.    Yes Historical Provider, MD  gabapentin (NEURONTIN) 300 MG capsule Take 300-900 mg by mouth 3 (three) times daily. Take 300mg  in the am and afternoon, and then take 900mg  at night   Yes Historical Provider, MD  ibuprofen (ADVIL,MOTRIN) 200 MG tablet Take 800 mg by mouth every 6 (six) hours as needed for moderate pain.   Yes Historical Provider, MD  oxycodone (ROXICODONE) 30 MG immediate release tablet Take 30 mg by mouth every 4 (four) hours as needed for pain (for pain). Reported on 06/03/2015   Yes Historical Provider,  MD  sertraline (ZOLOFT) 100 MG tablet Take 100 mg by mouth at bedtime. 02/10/16 02/09/17 Yes Historical Provider, MD  torsemide (DEMADEX) 10 MG tablet Take 10 mg by mouth daily. And may take an extra dose if needed for fluid/edema 01/13/15  Yes Historical Provider, MD  amLODipine (NORVASC) 10 MG tablet Take 1 tablet (10 mg total) by mouth daily. Patient not taking: Reported on 03/18/2016 12/26/15   Domenic Polite, MD  cephALEXin (KEFLEX) 500 MG capsule Take 1 capsule (500 mg total) by mouth 3 (three) times  daily. For 2days Patient not taking: Reported on 03/18/2016 12/25/15   Domenic Polite, MD  polyethylene glycol [facility] / Floria Raveling) packet Take 17 g by mouth daily as needed for moderate constipation. Patient not taking: Reported on 03/18/2016 12/25/15   Domenic Polite, MD    Physical Exam: Vitals:   03/18/16 0346 03/18/16 0415 03/18/16 0500 03/18/16 0515  BP:  (!) 110/53 129/69 118/64  Pulse:  84 87 (!) 46  Resp:  15 16 (!) 30  Temp:      TempSrc:      SpO2: 95% 94% 94% 96%      Constitutional: Moderately built and nourished. Vitals:   03/18/16 0346 03/18/16 0415 03/18/16 0500 03/18/16 0515  BP:  (!) 110/53 129/69 118/64  Pulse:  84 87 (!) 46  Resp:  15 16 (!) 30  Temp:      TempSrc:      SpO2: 95% 94% 94% 96%   Eyes: Anicteric no pallor. ENMT: No discharge from the ears eyes nose and mouth. Neck: No mass felt. No neck rigidity. Respiratory: No rhonchi or crepitations. Cardiovascular: S1-S2 heard no murmurs appreciated. Abdomen: Soft nontender bowel sounds present. No guarding or rigidity. Musculoskeletal: Left elbow is under splint. Skin: No rash. Skin appears warm. Neurologic: Alert awake oriented to time place and person with periods of confusion. Moves all activities. Psychiatric: Has periods of confusion.   Labs on Admission: I have personally reviewed following labs and imaging studies  CBC:  Recent Labs Lab 03/17/16 2205  WBC 9.2  HGB 10.6*  HCT 33.3*  MCV  87.4  PLT 818*   Basic Metabolic Panel:  Recent Labs Lab 03/17/16 2205  NA 135  K 5.1  CL 100*  CO2 17*  GLUCOSE 170*  BUN 82*  CREATININE 6.42*  CALCIUM 9.1   GFR: CrCl cannot be calculated (Unknown ideal weight.). Liver Function Tests:  Recent Labs Lab 03/17/16 2205  AST 25  ALT 14  ALKPHOS 80  BILITOT 0.5  PROT 7.1  ALBUMIN 4.2   No results for input(s): LIPASE, AMYLASE in the last 168 hours. No results for input(s): AMMONIA in the last 168 hours. Coagulation Profile: No results for input(s): INR, PROTIME in the last 168 hours. Cardiac Enzymes: No results for input(s): CKTOTAL, CKMB, CKMBINDEX, TROPONINI in the last 168 hours. BNP (last 3 results) No results for input(s): PROBNP in the last 8760 hours. HbA1C: No results for input(s): HGBA1C in the last 72 hours. CBG: No results for input(s): GLUCAP in the last 168 hours. Lipid Profile: No results for input(s): CHOL, HDL, LDLCALC, TRIG, CHOLHDL, LDLDIRECT in the last 72 hours. Thyroid Function Tests: No results for input(s): TSH, T4TOTAL, FREET4, T3FREE, THYROIDAB in the last 72 hours. Anemia Panel: No results for input(s): VITAMINB12, FOLATE, FERRITIN, TIBC, IRON, RETICCTPCT in the last 72 hours. Urine analysis:    Component Value Date/Time   COLORURINE YELLOW 03/18/2016 0052   APPEARANCEUR HAZY (A) 03/18/2016 0052   LABSPEC 1.013 03/18/2016 0052   PHURINE 5.0 03/18/2016 0052   GLUCOSEU NEGATIVE 03/18/2016 0052   HGBUR SMALL (A) 03/18/2016 0052   BILIRUBINUR NEGATIVE 03/18/2016 0052   KETONESUR NEGATIVE 03/18/2016 0052   PROTEINUR NEGATIVE 03/18/2016 0052   NITRITE NEGATIVE 03/18/2016 0052   LEUKOCYTESUR TRACE (A) 03/18/2016 0052   Sepsis Labs: @LABRCNTIP (procalcitonin:4,lacticidven:4) )No results found for this or any previous visit (from the past 240 hour(s)).   Radiological Exams on Admission: Dg Chest 1 View  Result Date: 03/18/2016 CLINICAL DATA:  Altered mental  status EXAM: CHEST 1 VIEW  COMPARISON:  05/09/2014 CXR FINDINGS: Heart is enlarged. The thoracic aorta is tortuous and partially calcified. Mild interstitial prominence is noted, chronic in appearance without pneumonic consolidation, effusion or pneumothorax. Surgical clips project over both humeral heads. Single screw projects over the right scapula and axilla. Spurring is noted off both humeral heads. IMPRESSION: Stable cardiomegaly. No acute pulmonary disease. Aortic atherosclerosis. Electronically Signed   By: Ashley Royalty M.D.   On: 03/18/2016 01:36   Dg Forearm Left  Result Date: 03/17/2016 CLINICAL DATA:  76 year old female with fall and pain in the left forearm. EXAM: LEFT FOREARM - 2 VIEW COMPARISON:  Left wrist radiograph dated 03/24/2015 FINDINGS: There is a displaced fracture of the proximal ulna involving the olecranon process with approximately 2 cm distraction. There is a a nondisplaced linear fracture in the proximal ulna extending into the articular surface. No other acute fracture identified. The bones are osteopenic. Distal radial fixation plate and screws noted from prior fracture. There is no dislocation. The radiocapitellar alignment is preserved. There are degenerative changes of the carpal bones. There is soft tissue swelling of the elbow and proximal forearm. No radiopaque foreign object or soft tissue gas. IMPRESSION: Displaced fracture of the olecranon process of the ulna with inter articular extension of the fracture. No dislocation. Additional fractures may be present but not evident due to osteopenia. Dedicated radiograph of the elbow or CT is recommended for further evaluation. Electronically Signed   By: Anner Crete M.D.   On: 03/17/2016 22:26   Ct Head Wo Contrast  Result Date: 03/18/2016 CLINICAL DATA:  Status post fall multiple times, with confusion. Concern for head or cervical spine injury. Initial encounter. EXAM: CT HEAD WITHOUT CONTRAST CT CERVICAL SPINE WITHOUT CONTRAST TECHNIQUE:  Multidetector CT imaging of the head and cervical spine was performed following the standard protocol without intravenous contrast. Multiplanar CT image reconstructions of the cervical spine were also generated. 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 No evidence of hemorrhage, hydrocephalus, extra-axial collection or mass lesion/mass effect. Prominence of the ventricles and sulci reflects mild cortical volume loss. Scattered periventricular and subcortical white matter change likely reflects small vessel ischemic microangiopathy. The brainstem and fourth ventricle are within normal limits. The basal ganglia are unremarkable in appearance. The cerebral hemispheres demonstrate grossly normal gray-white differentiation. No mass effect or midline shift is seen. Vascular: No hyperdense vessel or unexpected calcification. Skull: There is no evidence of fracture; visualized osseous structures are unremarkable in appearance. Sinuses/Orbits: The orbits are within normal limits. There is mild partial opacification of the sphenoid sinus. The remaining paranasal sinuses and mastoid air cells are well-aerated. Other: No significant soft tissue abnormalities are seen. CT CERVICAL SPINE FINDINGS Alignment: There is grade 1 anterolisthesis of C3 on C4. Underlying facet disease is noted. Skull base and vertebrae: No acute fracture. No primary bone lesion or focal pathologic process. Soft tissues and spinal canal: No prevertebral fluid or swelling. No visible canal hematoma. Disc levels: Multilevel disc space narrowing is noted along the lower cervical spine, with enlarged anterior disc osteophyte complexes. Mild underlying facet disease is noted. Upper chest: Hypodensities within the thyroid gland measure up to 1.6 cm on the  left. The visualized lung apices are grossly clear. Scattered calcification is seen at the left lung apex.  Other: No additional soft tissue abnormalities are seen. IMPRESSION: 1. No evidence of traumatic intracranial injury or fracture. 2. No evidence of acute fracture or subluxation along the cervical spine. 3. 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 Would correlate for any associated symptoms. 4. Mild cortical volume loss and scattered small vessel ischemic microangiopathy. 5. Mild degenerative change along the cervical spine, with grade 1 anterolisthesis of C3 on C4. 6. Mild partial opacification of the sphenoid sinus. 7. **An incidental finding of potential clinical significance has been found. Hypodensities within the thyroid gland measure up to 1.6 cm on the left. Consider further evaluation with thyroid ultrasound. If patient is clinically hyperthyroid, consider nuclear medicine thyroid uptake and scan.** Electronically Signed   By: Garald Balding M.D.   On: 03/18/2016 01:35   Ct Cervical Spine Wo Contrast  Result Date: 03/18/2016 CLINICAL DATA:  Status post fall multiple times, with confusion. Concern for head or cervical spine injury. Initial encounter. EXAM: CT HEAD WITHOUT CONTRAST CT CERVICAL SPINE WITHOUT CONTRAST TECHNIQUE: Multidetector CT imaging of the head and cervical spine was performed following the standard protocol without intravenous contrast. Multiplanar CT image reconstructions of the cervical spine were also generated. 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 No evidence of hemorrhage, hydrocephalus, extra-axial collection or mass lesion/mass effect. Prominence of the ventricles and sulci reflects mild cortical volume loss.  Scattered periventricular and subcortical white matter change likely reflects small vessel ischemic microangiopathy. The brainstem and fourth ventricle are within normal limits. The basal ganglia are unremarkable in appearance. The cerebral hemispheres demonstrate grossly normal gray-white differentiation. No mass effect or midline shift is seen. Vascular: No hyperdense vessel or unexpected calcification. Skull: There is no evidence of fracture; visualized osseous structures are unremarkable in appearance. Sinuses/Orbits: The orbits are within normal limits. There is mild partial opacification of the sphenoid sinus. The remaining paranasal sinuses and mastoid air cells are well-aerated. Other: No significant soft tissue abnormalities are seen. CT CERVICAL SPINE FINDINGS Alignment: There is grade 1 anterolisthesis of C3 on C4. Underlying facet disease is noted. Skull base and vertebrae: No acute fracture. No primary bone lesion or focal pathologic process. Soft tissues and spinal canal: No prevertebral fluid or swelling. No visible canal hematoma. Disc levels: Multilevel disc space narrowing is noted along the lower cervical spine, with enlarged anterior disc osteophyte complexes. Mild underlying facet disease is noted. Upper chest: Hypodensities within the thyroid gland measure up to 1.6 cm on the left. The visualized lung apices are grossly clear. Scattered calcification is seen at the left lung apex. Other: No additional soft tissue abnormalities are seen. IMPRESSION: 1. No evidence of traumatic intracranial injury or fracture. 2. No evidence of acute fracture or subluxation along the cervical spine. 3. 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 Would correlate for any associated symptoms. 4. Mild cortical volume loss and scattered small vessel ischemic microangiopathy. 5. Mild degenerative change along the cervical spine, with grade 1 anterolisthesis of C3 on  C4. 6. Mild partial opacification of the sphenoid sinus. 7. **An incidental finding of potential clinical significance has been found. Hypodensities within the thyroid gland measure up to 1.6 cm on the left. Consider further evaluation with thyroid ultrasound. If patient  is clinically hyperthyroid, consider nuclear medicine thyroid uptake and scan.** Electronically Signed   By: Garald Balding M.D.   On: 03/18/2016 01:35   US Renal  Result Date: 03/18/2016 CLINICAL DATA:  76 year old female with acute renal failure. EXAM: RENAL / URINARY TRACT ULTRASOUND COMPLETE COMPARISON:  None. FINDINGS: Right Kidney: Length: 11 cm. There is increased renal echotexture. No hydronephrosis or echogenic stone. Left Kidney: Length: 11 cm. There is increased renal echogenicity. There is a 1.0 x 0.8 x 1.3 cm cyst in the left kidney. Bladder: Appears normal for degree of bladder distention. IMPRESSION: Increased renal echotexture compatible with underlying chronic medical renal disease. No hydronephrosis or echogenic stone. Electronically Signed   By: Anner Crete M.D.   On: 03/18/2016 03:22     Assessment/Plan Principal Problem:   Acute renal failure [facility]) Active Problems:   Chronic pain syndrome   Acute encephalopathy   Elbow fracture, left, closed, initial encounter    1. Acute renal failure with metabolic acidosis nonoliguric - cause not clear. Patient so far has made 200 mL output. Patient has been on indomethacin last few days and has been taking ibuprofen prior to that, Patient is also on Demadex. We'll check FENa. I have ordered normal saline 500 bolus with normal saline at 100 mL per hour. Renal sonogram does not show any obstruction. Shows chronic renal disease. Check CK levels, SPEP and UPEP. Will check salicylate levels. Hold Demadex and discontinue NSAIDs. 2. Acute encephalopathy - most likely multifactorial including metabolic encephalopathy secondary to electrolyte changes, pain medications, possible  UTI and CT showing possible subacute infarct. Check ammonia levels. 3. Possible subacute stroke - I have discussed with on-call neurologist Dr. Nicole Kindred who advised. MRI brain and if it shows stroke and further workup. 4. UTI - check urine cultures. Patient is placed on ceftriaxone. 5. Chronic pain syndrome and arthritis - I have decreased patient's oxycodone from 30 mg every 6 hourly to 10 mg every 6 when necessary and decreased gabapentin from 300 mg 3 times a day to 300 mg at bedtime due to renal failure. May have to hold all these medications if there is decreasing urine output or worsening creatinine. 6. Left olecranon fracture - please consult orthopedics surgery in a.m. 7. Chronic anemia - check anemia panel.  ABG, MRI brain, x-ray pelvis and EKG are pending.   DVT prophylaxis: Heparin. Code Status: Full code.  Family Communication: Discussed with patient's daughter.  Disposition Plan: To be determined.  Consults called: Discussed with neurologist. Physical therapy consult.  Admission status: Inpatient.    Rise Patience MD [facility] Pager [phone].  If 7PM-7AM, please contact night-coverage [email or web address] Password [email or web address]  03/18/2016, 5:46 AM

## 2016-03-18 NOTE — ED Provider Notes (Addendum)
[facility] DEPT Provider Note   CSN: [id number] Arrival date & time: 03/17/16  2116  By signing my name below, I, Delton Prairie, attest that this documentation has been prepared under the direction and in the presence of Delora Fuel, MD  Electronically Signed: Delton Prairie, ED Scribe. 03/18/16. 1:01 AM.  History   Chief Complaint Chief Complaint  Patient presents with  . Altered Mental Status   The history is provided by the patient. No language interpreter was used.   HPI Comments:  Shirley [name] is a 76 y.o. female who presents to the Emergency Department complaining of acute onset, moderate left arm pain s/p 4 falls which occurred yesterday. Daughter repots the pt is confused, has been having visual hallucinations and has been disoriented x yesterday. She states the pt was last normal 2 nights ago. Pt reports she hit her head and notes associated pain. No alleviating factors noted. Pt denies any other associated symptoms at this time. Pt is on indomethacin which she takes daily.    PCP: Ardith Dark, PA-C  Orthopedist: Dr. Latanya Maudlin   Past Medical History:  Diagnosis Date  . Allergy    vasomotor rhinitis  . Asthma    h/o asthma and bronchitis  . BV (bacterial vaginosis)    history of frequent  . Cervical spondylosis    herniated disk protruding @ C6-7, impinging cord and left axillary root sleeve.  . Chronic pain   . Depression   . DVT of upper extremity (deep vein thrombosis) ([place])    h/o left(after wrist fracture/surgery); no residual thrombus or phlebitis by Dopplers July 2014  . Dyspnea    with exertion  . Edema   . Fibromyalgia    sees Dr.Phillips-pain management  . GERD (gastroesophageal reflux disease)    h/o  . Headache    marigraine- hormal - none in years  . Hypertension   . Neuropathy ([place])   . Neuropathy ([place])   . Osteopenia    mild at hip (T-1.3)  . PAT (paroxysmal atrial tachycardia) ([place])   . Varicose veins   . Varicose veins of both  legs with edema 08/2012   Also pain; bilateral GSV dilated with reflux, R Short SV dilated with reflux; not amenable to VNUS ablation due to tortuosity and superficial nature of veins -- recommeded referral to Dr. Eilleen Kempf @ [facility]  . Vision problem    wears glasses  . Vitamin D deficiency     Patient Active Problem List   Diagnosis Date Noted  . Acute pain of left hip 12/23/2015  . UTI (urinary tract infection) 12/23/2015  . DNR (do not resuscitate) discussion 12/23/2015  . Palliative care by specialist 12/23/2015  . Chronic pain syndrome 12/23/2015  . Left hip pain 12/23/2015  . Effusion of left hip   . Lytic bone lesions on xray   . Exertional dyspnea 09/07/2012  . Varicose veins of both lower extremities with pain 08/27/2012  . Lower extremity edema 08/27/2012  . Abnormal resting ECG findings - sinus rhythm with PACs, and PAT 08/19/2012  . Aortic systolic murmur on examination 08/19/2012  . PAT (paroxysmal atrial tachycardia) / MAT 08/19/2012    Past Surgical History:  Procedure Laterality Date  . ABDOMINAL HYSTERECTOMY  1999   BSO for endometriosis  . APPENDECTOMY    . BACK SURGERY  age 62   ruptured disk L3-4   . CATARACT EXTRACTION  2000   B/L  . COLONOSCOPY    . DOPPLER ECHOCARDIOGRAPHY  July 2012   Normal EF 60-65% , mild concentric LVH. Grade 2 diastolic dysfunction with elevated EDP. Massive left atrial dilation. Pulmonary pressures 30-40 mm Hg; aortic sclerosis but no stenosis. Moderate TR  . FINGER SURGERY     left finger for tender rupture from fall.  Marland Kitchen FOOT SURGERY     multiple(at least 4 on right, 5 on left)-left Dr.Kerner([place])11/08,left Dr.Nunley-excision 2nd and 3rd mt heads w/artho and hammertoe surgery 4th and 5th 04/2006. left great toe IP fusion and revision of fusion of 2nd through 5th toes 12/2005. Right foot surgeries  Dr.Bednarz 04/2008 and 04/2009.  Marland Kitchen KNEE SURGERY Right    right knee replacement  . NECK SURGERY  2007   cervical  .  RADIOLOGY WITH ANESTHESIA Left 01/06/2016   Procedure: MRI LEFT HIP WITH AND WITHOUT;  Surgeon: Medication Radiologist, MD;  Location: [facility];  Service: Radiology;  Laterality: Left;  . REFRACTIVE SURGERY  2007   left eye  . SHOULDER SURGERY     x8 (4 on rt side and 4 on left side)  . SPINAL FUSION  6/09   Dr.Cohen  . TONSILLECTOMY AND ADENOIDECTOMY    . WRIST SURGERY Left    2 surgeries left wrist after fracture(complicated by DVT)    OB History    No data available       Home Medications    Prior to Admission medications   Medication Sig Start Date End Date Taking? Authorizing Provider  ALPRAZolam Duanne Moron) 1 MG tablet Take 0.5-1 mg by mouth 3 (three) times daily as needed for anxiety or sleep.    Yes Historical Provider, MD  amphetamine-dextroamphetamine (ADDERALL) 20 MG tablet Take 20 mg by mouth daily as needed (ADHD).  05/13/15 03/18/16 Yes Historical Provider, MD  cetirizine (ZYRTEC) 10 MG tablet Take 10 mg by mouth daily as needed for allergies.  06/24/14  Yes Historical Provider, MD  Cholecalciferol (VITAMIN D3) 5000 UNITS CAPS Take 5,000 Units by mouth 2 (two) times daily.    Yes Historical Provider, MD  gabapentin (NEURONTIN) 300 MG capsule Take 300-900 mg by mouth 3 (three) times daily. Take 300mg  in the am and afternoon, and then take 900mg  at night   Yes Historical Provider, MD  ibuprofen (ADVIL,MOTRIN) 200 MG tablet Take 800 mg by mouth every 6 (six) hours as needed for moderate pain.   Yes Historical Provider, MD  oxycodone (ROXICODONE) 30 MG immediate release tablet Take 30 mg by mouth every 4 (four) hours as needed for pain (for pain). Reported on 06/03/2015   Yes Historical Provider, MD  sertraline (ZOLOFT) 100 MG tablet Take 100 mg by mouth at bedtime. 02/10/16 02/09/17 Yes Historical Provider, MD  torsemide (DEMADEX) 10 MG tablet Take 10 mg by mouth daily. And may take an extra dose if needed for fluid/edema 01/13/15  Yes Historical Provider, MD  amLODipine (NORVASC) 10  MG tablet Take 1 tablet (10 mg total) by mouth daily. Patient not taking: Reported on 03/18/2016 12/26/15   Domenic Polite, MD  cephALEXin (KEFLEX) 500 MG capsule Take 1 capsule (500 mg total) by mouth 3 (three) times daily. For 2days Patient not taking: Reported on 03/18/2016 12/25/15   Domenic Polite, MD  polyethylene glycol [facility] / Floria Raveling) packet Take 17 g by mouth daily as needed for moderate constipation. Patient not taking: Reported on 03/18/2016 12/25/15   Domenic Polite, MD    Family History Family History  Problem Relation Age of Onset  . Stroke Mother   . Osteoarthritis Mother  Died at age 90  . Heart disease Father   . Lung cancer Father   . Heart failure Father     Died at age 52  . Osteoporosis Sister   . Diabetes Brother   . Heart attack Brother 13  . Diabetes Brother   . Diabetes Brother   . Heart attack Brother 64  . Breast cancer Paternal Aunt   . Heart failure Maternal Grandmother     Died at age 27, unsure of her heart disease  . Heart failure Paternal Grandfather     Died at age 51, unsure of cardiac disease.    Social History Social History  Substance Use Topics  . Smoking status: Current Some Day Smoker  . Smokeless tobacco: Never Used     Comment: doesnt smoke every day  . Alcohol use No     Allergies   Patient has no known allergies.   Review of Systems Review of Systems  Constitutional: Negative for chills and fever.  Musculoskeletal: Positive for arthralgias and myalgias.  Neurological: Positive for headaches.  Psychiatric/Behavioral: Positive for hallucinations.  All other systems reviewed and are negative.  Physical Exam Updated Vital Signs BP 143/70   Pulse 76   Temp 98.5 F (36.9 C) (Oral)   Resp 18   SpO2 98%   Physical Exam  Constitutional: She is oriented to person, place, and time. She appears well-developed and well-nourished.  HENT:  Head: Normocephalic and atraumatic.  Eyes: EOM are normal. Pupils are equal,  round, and reactive to light.  Neck: Normal range of motion. No JVD present.  Cardiovascular: Normal rate, regular rhythm and normal heart sounds.   No murmur heard. Pulmonary/Chest: Effort normal and breath sounds normal. She has no wheezes. She has no rales. She exhibits no tenderness.  Abdominal: Soft. Bowel sounds are normal. She exhibits no distension and no mass. There is no tenderness.  Musculoskeletal: Normal range of motion. She exhibits no edema.  Tender left elbow but does have full active ROM. Distal neurovascular intact.   Lymphadenopathy:    She has no cervical adenopathy.  Neurological: She is alert and oriented to person, place, and time. No cranial nerve deficit. She exhibits normal muscle tone. Coordination normal.  Slow to answer questions. Moderate cogwheel rigidity.    Skin: Skin is warm and dry. No rash noted.  Psychiatric: She has a normal mood and affect. Her behavior is normal. Judgment and thought content normal.  Nursing note and vitals reviewed.    ED Treatments / Results  DIAGNOSTIC STUDIES:  Oxygen Saturation is 98% on RA, normal by my interpretation.    COORDINATION OF CARE:  12:55 AM Discussed treatment plan with pt at bedside and pt agreed to plan.  Labs (all labs ordered are listed, but only abnormal results are displayed) Labs Reviewed  COMPREHENSIVE METABOLIC PANEL - Abnormal; Notable for the following:       Result Value   Chloride 100 (*)    CO2 17 (*)    Glucose, Bld 170 (*)    BUN 82 (*)    Creatinine, Ser 6.42 (*)    GFR calc non Af Amer 6 (*)    GFR calc Af Amer 6 (*)    Anion gap 18 (*)    All other components within normal limits  CBC - Abnormal; Notable for the following:    RBC 3.81 (*)    Hemoglobin 10.6 (*)    HCT 33.3 (*)    Platelets 870 (*)  All other components within normal limits  URINALYSIS, ROUTINE W REFLEX MICROSCOPIC - Abnormal; Notable for the following:    APPearance HAZY (*)    Hgb urine dipstick SMALL (*)     Leukocytes, UA TRACE (*)    Bacteria, UA MANY (*)    Squamous Epithelial / LPF 0-5 (*)    All other components within normal limits  URINE CULTURE  SODIUM, URINE, RANDOM  CREATININE, URINE, RANDOM  I-STAT CG4 LACTIC ACID, ED     Radiology Dg Chest 1 View  Result Date: 03/18/2016 CLINICAL DATA:  Altered mental status EXAM: CHEST 1 VIEW COMPARISON:  05/09/2014 CXR FINDINGS: Heart is enlarged. The thoracic aorta is tortuous and partially calcified. Mild interstitial prominence is noted, chronic in appearance without pneumonic consolidation, effusion or pneumothorax. Surgical clips project over both humeral heads. Single screw projects over the right scapula and axilla. Spurring is noted off both humeral heads. IMPRESSION: Stable cardiomegaly. No acute pulmonary disease. Aortic atherosclerosis. Electronically Signed   By: Ashley Royalty M.D.   On: 03/18/2016 01:36   Dg Forearm Left  Result Date: 03/17/2016 CLINICAL DATA:  76 year old female with fall and pain in the left forearm. EXAM: LEFT FOREARM - 2 VIEW COMPARISON:  Left wrist radiograph dated 03/24/2015 FINDINGS: There is a displaced fracture of the proximal ulna involving the olecranon process with approximately 2 cm distraction. There is a a nondisplaced linear fracture in the proximal ulna extending into the articular surface. No other acute fracture identified. The bones are osteopenic. Distal radial fixation plate and screws noted from prior fracture. There is no dislocation. The radiocapitellar alignment is preserved. There are degenerative changes of the carpal bones. There is soft tissue swelling of the elbow and proximal forearm. No radiopaque foreign object or soft tissue gas. IMPRESSION: Displaced fracture of the olecranon process of the ulna with inter articular extension of the fracture. No dislocation. Additional fractures may be present but not evident due to osteopenia. Dedicated radiograph of the elbow or CT is recommended for  further evaluation. Electronically Signed   By: Anner Crete M.D.   On: 03/17/2016 22:26   Ct Head Wo Contrast  Result Date: 03/18/2016 CLINICAL DATA:  Status post fall multiple times, with confusion. Concern for head or cervical spine injury. Initial encounter. EXAM: CT HEAD WITHOUT CONTRAST CT CERVICAL SPINE WITHOUT CONTRAST TECHNIQUE: Multidetector CT imaging of the head and cervical spine was performed following the standard protocol without intravenous contrast. Multiplanar CT image reconstructions of the cervical spine were also generated. 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 No evidence of hemorrhage, hydrocephalus, extra-axial collection or mass lesion/mass effect. Prominence of the ventricles and sulci reflects mild cortical volume loss. Scattered periventricular and subcortical white matter change likely reflects small vessel ischemic microangiopathy. The brainstem and fourth ventricle are within normal limits. The basal ganglia are unremarkable in appearance. The cerebral hemispheres demonstrate grossly normal gray-white differentiation. No mass effect or midline shift is seen. Vascular: No hyperdense vessel or unexpected calcification. Skull: There is no evidence of fracture; visualized osseous structures are unremarkable in appearance. Sinuses/Orbits: The orbits are within normal limits. There is mild partial opacification of the sphenoid sinus. The remaining paranasal sinuses and mastoid air cells are well-aerated. Other: No significant soft tissue abnormalities are seen. CT CERVICAL SPINE FINDINGS Alignment: There  is grade 1 anterolisthesis of C3 on C4. Underlying facet disease is noted. Skull base and vertebrae: No acute fracture. No primary bone lesion or focal pathologic process.  Soft tissues and spinal canal: No prevertebral fluid or swelling. No visible canal hematoma. Disc levels: Multilevel disc space narrowing is noted along the lower cervical spine, with enlarged anterior disc osteophyte complexes. Mild underlying facet disease is noted. Upper chest: Hypodensities within the thyroid gland measure up to 1.6 cm on the left. The visualized lung apices are grossly clear. Scattered calcification is seen at the left lung apex. Other: No additional soft tissue abnormalities are seen. IMPRESSION: 1. No evidence of traumatic intracranial injury or fracture. 2. No evidence of acute fracture or subluxation along the cervical spine. 3. 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 Would correlate for any associated symptoms. 4. Mild cortical volume loss and scattered small vessel ischemic microangiopathy. 5. Mild degenerative change along the cervical spine, with grade 1 anterolisthesis of C3 on C4. 6. Mild partial opacification of the sphenoid sinus. 7. **An incidental finding of potential clinical significance has been found. Hypodensities within the thyroid gland measure up to 1.6 cm on the left. Consider further evaluation with thyroid ultrasound. If patient is clinically hyperthyroid, consider nuclear medicine thyroid uptake and scan.** Electronically Signed   By: Garald Balding M.D.   On: 03/18/2016 01:35   Ct Cervical Spine Wo Contrast  Result Date: 03/18/2016 CLINICAL DATA:  Status post fall multiple times, with confusion. Concern for head or cervical spine injury. Initial encounter. EXAM: CT HEAD WITHOUT CONTRAST CT CERVICAL SPINE WITHOUT CONTRAST TECHNIQUE: Multidetector CT imaging of the head and cervical spine was performed following the standard protocol without intravenous contrast. Multiplanar CT image reconstructions of the cervical spine were also generated. 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 No evidence of hemorrhage, hydrocephalus, extra-axial collection or mass lesion/mass effect. Prominence of the ventricles and sulci reflects mild cortical volume loss. Scattered periventricular and subcortical white matter change likely reflects small vessel ischemic microangiopathy. The brainstem and fourth ventricle are within normal limits. The basal ganglia are unremarkable in appearance. The cerebral hemispheres demonstrate grossly normal gray-white differentiation. No mass effect or midline shift is seen. Vascular: No hyperdense vessel or unexpected calcification. Skull: There is no evidence of fracture; visualized osseous structures are unremarkable in appearance. Sinuses/Orbits: The orbits are within normal limits. There is mild partial opacification of the sphenoid sinus. The remaining paranasal sinuses and mastoid air cells are well-aerated. Other: No significant soft tissue abnormalities are seen. CT CERVICAL SPINE FINDINGS Alignment: There is grade 1 anterolisthesis of C3 on C4. Underlying facet disease is noted. Skull base and vertebrae: No acute fracture. No primary bone lesion or focal pathologic process. Soft tissues and spinal canal: No prevertebral fluid or swelling. No visible canal hematoma. Disc levels: Multilevel disc space narrowing is noted along the lower cervical spine, with enlarged anterior disc osteophyte complexes. Mild underlying facet disease is noted. Upper chest: Hypodensities within the thyroid gland measure up to 1.6 cm on the left. The visualized lung apices are grossly clear. Scattered calcification is seen at the left lung apex. Other: No additional soft tissue abnormalities are seen. IMPRESSION: 1. No evidence of traumatic intracranial injury or fracture. 2. No evidence  of acute fracture or  subluxation along the cervical spine. 3. 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 Would correlate for any associated symptoms. 4. Mild cortical volume loss and scattered small vessel ischemic microangiopathy. 5. Mild degenerative change along the cervical spine, with grade 1 anterolisthesis of C3 on C4. 6. Mild partial opacification of the sphenoid sinus. 7. **An incidental finding of potential clinical significance has been found. Hypodensities within the thyroid gland measure up to 1.6 cm on the left. Consider further evaluation with thyroid ultrasound. If patient is clinically hyperthyroid, consider nuclear medicine thyroid uptake and scan.** Electronically Signed   By: Garald Balding M.D.   On: 03/18/2016 01:35   US Renal  Result Date: 03/18/2016 CLINICAL DATA:  76 year old female with acute renal failure. EXAM: RENAL / URINARY TRACT ULTRASOUND COMPLETE COMPARISON:  None. FINDINGS: Right Kidney: Length: 11 cm. There is increased renal echotexture. No hydronephrosis or echogenic stone. Left Kidney: Length: 11 cm. There is increased renal echogenicity. There is a 1.0 x 0.8 x 1.3 cm cyst in the left kidney. Bladder: Appears normal for degree of bladder distention. IMPRESSION: Increased renal echotexture compatible with underlying chronic medical renal disease. No hydronephrosis or echogenic stone. Electronically Signed   By: Anner Crete M.D.   On: 03/18/2016 03:22    EKG Interpretation  Date/Time:  Thursday March 18 2016 06:53:06 EST Ventricular Rate:  77 PR Interval:    QRS Duration: 85 QT Interval:  380 QTC Calculation: 430 R Axis:   96 Text Interpretation:  Sinus rhythm Right axis deviation Borderline T wave abnormalities When compared with ECG of 08/27/2008, Nonspecific T wave abnormality are now present Confirmed by [facility]  MD, Kamaryn Grimley ([id number]) on 03/18/2016 6:59:37 AM        Procedures Procedures (including critical care  time) SPLINT APPLICATION Date/Time: 4:78 AM Authorized by: GNFAO,ZHYQM Consent: Verbal consent obtained. Risks and benefits: risks, benefits and alternatives were discussed Consent given by: patient Splint applied by: orthopedic technician Location details: left arm Splint type: posterior long arm  Supplies used: cast padding, Orthoglas, Ace wrap Post-procedure: The splinted body part was neurovascularly unchanged following the procedure. Patient tolerance: Patient tolerated the procedure well with no immediate complications.   Medications Ordered in ED Medications  cefTRIAXone (ROCEPHIN) 1 g in dextrose 5 % 50 mL IVPB (1 g Intravenous New Bag/Given 03/18/16 0355)     Initial Impression / Assessment and Plan / ED Course  I have reviewed the triage vital signs and the nursing notes.  Pertinent labs & imaging results that were available during my care of the patient were reviewed by me and considered in my medical decision making (see chart for details).  Fall at home with left arm injury. X-ray shows olecranon fracture. Arm is splinted for comfort. She will need orthopedic consultation for open reduction and internal fixation. Confusion with visual hallucinations. Screening labs are obtained showing evidence of acute renal failure. Creatinine has gone from 0.96 on November 1, to 6.42 today. Metabolic acidosis is consistent with renal failure. She has mild anemia which is unchanged from baseline. Urinalysis does show bacteria so she started on ceftriaxone and urine culture sent. Renal ultrasound was obtained which shows no acute process. Because of her fall, CT was obtained of head and cervical spine which were unremarkable. Case is discussed with Dr. Hal Hope of [facility] hospital[facility] who agrees to admit the patient.  Final Clinical Impressions(s) / ED Diagnoses   Final diagnoses:  Acute  renal failure, unspecified acute renal failure type ([place])  Fall at home, initial encounter  Closed  fracture of left olecranon process, initial encounter  Urinary tract infection without hematuria, site unspecified  Confusion  Visual hallucination  Normochromic normocytic anemia    New Prescriptions New Prescriptions   No medications on file    I personally performed the services described in this documentation, which was scribed in my presence. The recorded information has been reviewed and is accurate.      Delora Fuel, MD 61/48/30 [id number]    Delora Fuel, MD 30/14/84 0397    Delora Fuel, MD 95/36/92 2300

## 2016-03-18 NOTE — ED Notes (Signed)
Patient transported to EEG

## 2016-03-18 NOTE — Progress Notes (Signed)
Pharmacy Antibiotic Note  Shirley [name] is a 76 y.o. female admitted on 03/17/2016 with UTI.  Pharmacy has been consulted for Rocephin dosing.  Plan: Rocephin 1gm IV q24h Pharmacy will sign off - please reconsult if needed     Temp (24hrs), Avg:98.5 F (36.9 C), Min:98.5 F (36.9 C), Max:98.5 F (36.9 C)   Recent Labs Lab 03/17/16 2205 03/17/16 2214  WBC 9.2  --   CREATININE 6.42*  --   LATICACIDVEN  --  1.22    CrCl cannot be calculated (Unknown ideal weight.).    No Known Allergies  Thank you for allowing pharmacy to be a part of this patient's care.  Sherlon Handing, PharmD, BCPS Clinical pharmacist, pager [phone] 03/18/2016 5:46 AM

## 2016-03-18 NOTE — ED Notes (Signed)
Attempted report 

## 2016-03-18 NOTE — ED Notes (Addendum)
Assisting pt with eating lunch. Pt did not finish lunch, due to having to get an echocardiogram.

## 2016-03-18 NOTE — Progress Notes (Signed)
Orthopedic Tech Progress Note Patient Details:  Shirley [name] 31-Oct-1940 [id number]  Ortho Devices Type of Ortho Device: Ace wrap, Arm sling, Post (long arm) splint Ortho Device/Splint Location: lue Ortho Device/Splint Interventions: Ordered, Application   Karolee Stamps 03/18/2016, 3:09 AM

## 2016-03-18 NOTE — Progress Notes (Signed)
Patient seen and evaluated earlier this AM by my associate. Please refer to plan in H and P.  Vital signs reviewed  MRI of brain reports no acute findings, Renal U/S reports no hydronephrosis  Will reassess next am.  Velvet Bathe

## 2016-03-18 NOTE — ED Notes (Signed)
Attempted report.  Pt on the way to the floor.

## 2016-03-18 NOTE — Consult Note (Signed)
Orthopaedic Trauma Service Consultation  Reason for Consult: Left olecranon fracture Referring Physician: Velvet Bathe, MD  Shirley [name] is an 76 y.o. female.  HPI: Patient fell multiple times yesterday, hallucinated, and was found to have an UTI with acute renal failure and displaced left olecranon. Patient is a veteran orthopaedic patient with over twenty surgeries by multiple patients, including Dr. Gladstone Lighter and [place] and [facility] Cord and [place]. Daughter at bedside. Appears from notes and patient that Mobic was added to her extensive med list in November. In a splint and sling.  Past Medical History:  Diagnosis Date  . Allergy    vasomotor rhinitis  . Asthma    h/o asthma and bronchitis  . BV (bacterial vaginosis)    history of frequent  . Cervical spondylosis    herniated disk protruding @ C6-7, impinging cord and left axillary root sleeve.  . Chronic pain   . Depression   . DVT of upper extremity (deep vein thrombosis) ([place])    h/o left(after wrist fracture/surgery); no residual thrombus or phlebitis by Dopplers July 2014  . Dyspnea    with exertion  . Edema   . Fibromyalgia    sees Dr.Phillips-pain management  . GERD (gastroesophageal reflux disease)    h/o  . Headache    marigraine- hormal - none in years  . Hypertension   . Neuropathy ([place])   . Neuropathy ([place])   . Osteopenia    mild at hip (T-1.3)  . PAT (paroxysmal atrial tachycardia) ([place])   . Varicose veins   . 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 Eilleen Kempf @ [facility]  . Vision problem    wears glasses  . Vitamin D deficiency     Past Surgical History:  Procedure Laterality Date  . ABDOMINAL HYSTERECTOMY  1999   BSO for endometriosis  . APPENDECTOMY    . BACK SURGERY  age 52   ruptured disk L3-4    . CATARACT EXTRACTION  2000   B/L  . COLONOSCOPY    . DOPPLER ECHOCARDIOGRAPHY  July 2012   Normal EF 60-65% , mild concentric LVH. Grade 2 diastolic dysfunction with elevated EDP. Massive left atrial dilation. Pulmonary pressures 30-40 mm Hg; aortic sclerosis but no stenosis. Moderate TR  . FINGER SURGERY     left finger for tender rupture from fall.  Marland Kitchen FOOT SURGERY     multiple(at least 4 on right, 5 on left)-left Dr.Kerner([facility])11/08,left Dr.Nunley-excision 2nd and 3rd mt heads w/artho and hammertoe surgery 4th and 5th 04/2006. left great toe IP fusion and revision of fusion of 2nd through 5th toes 12/2005. Right foot surgeries  Dr.Bednarz 04/2008 and 04/2009.  Marland Kitchen KNEE SURGERY Right    right knee replacement  . NECK SURGERY  2007   cervical  . RADIOLOGY WITH ANESTHESIA Left 01/06/2016   Procedure: MRI LEFT HIP WITH AND WITHOUT;  Surgeon: Medication Radiologist, MD;  Location: [facility];  Service: Radiology;  Laterality: Left;  . REFRACTIVE SURGERY  2007   left eye  . SHOULDER SURGERY     x8 (4 on rt side and 4 on left side)  . SPINAL FUSION  6/09   Dr.Cohen  . TONSILLECTOMY AND ADENOIDECTOMY    . WRIST SURGERY Left    2 surgeries left wrist after fracture(complicated by  DVT)    Family History  Problem Relation Age of Onset  . Stroke Mother   . Osteoarthritis Mother     Died at age 23  . Heart disease Father   . Lung cancer Father   . Heart failure Father     Died at age 25  . Osteoporosis Sister   . Diabetes Brother   . Heart attack Brother 42  . Diabetes Brother   . Diabetes Brother   . Heart attack Brother 67  . Breast cancer Paternal Aunt   . Heart failure Maternal Grandmother     Died at age 65, unsure of her heart disease  . Heart failure Paternal Grandfather     Died at age 31, unsure of cardiac disease.    Social History:  reports that she has been smoking.  She has never used smokeless tobacco. She reports that she does not drink alcohol or use  drugs.  Allergies: No Known Allergies  Medications:  Prior to Admission:  Prescriptions Prior to Admission  Medication Sig Dispense Refill Last Dose  . ALPRAZolam (XANAX) 1 MG tablet Take 0.5-1 mg by mouth 3 (three) times daily as needed for anxiety or sleep.    03/16/2016 at Unknown time  . amphetamine-dextroamphetamine (ADDERALL) 20 MG tablet Take 20 mg by mouth daily as needed (ADHD).    03/16/2016 at Unknown time  . cetirizine (ZYRTEC) 10 MG tablet Take 10 mg by mouth daily as needed for allergies.    03/16/2016 at Unknown time  . Cholecalciferol (VITAMIN D3) 5000 UNITS CAPS Take 5,000 Units by mouth 2 (two) times daily.    03/16/2016 at Unknown time  . gabapentin (NEURONTIN) 300 MG capsule Take 300-900 mg by mouth 3 (three) times daily. Take 349m in the am and afternoon, and then take 9065mat night   03/16/2016 at Unknown time  . ibuprofen (ADVIL,MOTRIN) 200 MG tablet Take 800 mg by mouth every 6 (six) hours as needed for moderate pain.   03/16/2016 at Unknown time  . oxycodone (ROXICODONE) 30 MG immediate release tablet Take 30 mg by mouth every 4 (four) hours as needed for pain (for pain). Reported on 06/03/2015   03/16/2016 at Unknown time  . sertraline (ZOLOFT) 100 MG tablet Take 100 mg by mouth at bedtime.   03/16/2016 at Unknown time  . torsemide (DEMADEX) 10 MG tablet Take 10 mg by mouth daily. And may take an extra dose if needed for fluid/edema   03/16/2016 at Unknown time  . amLODipine (NORVASC) 10 MG tablet Take 1 tablet (10 mg total) by mouth daily. (Patient not taking: Reported on 03/18/2016) 30 tablet 0 Not Taking at Unknown time  . cephALEXin (KEFLEX) 500 MG capsule Take 1 capsule (500 mg total) by mouth 3 (three) times daily. For 2days (Patient not taking: Reported on 03/18/2016) 6 capsule 0 Completed Course at Unknown time  . polyethylene glycol (MIRALAX / GLYCOLAX) packet Take 17 g by mouth daily as needed for moderate constipation. (Patient not taking: Reported on 03/18/2016) 14 each  0 Not Taking at Unknown time    Results for orders placed or performed during the hospital encounter of 03/17/16 (from the past 48 hour(s))  Comprehensive metabolic panel     Status: Abnormal   Collection Time: 03/17/16 10:05 PM  Result Value Ref Range   Sodium 135 135 - 145 mmol/L   Potassium 5.1 3.5 - 5.1 mmol/L   Chloride 100 (L) 101 - 111 mmol/L   CO2 17 (L) 22 -  32 mmol/L   Glucose, Bld 170 (H) 65 - 99 mg/dL   BUN 82 (H) 6 - 20 mg/dL   Creatinine, Ser 6.42 (H) 0.44 - 1.00 mg/dL   Calcium 9.1 8.9 - 10.3 mg/dL   Total Protein 7.1 6.5 - 8.1 g/dL   Albumin 4.2 3.5 - 5.0 g/dL   AST 25 15 - 41 U/L   ALT 14 14 - 54 U/L   Alkaline Phosphatase 80 38 - 126 U/L   Total Bilirubin 0.5 0.3 - 1.2 mg/dL   GFR calc non Af Amer 6 (L) >60 mL/min   GFR calc Af Amer 6 (L) >60 mL/min    Comment: (NOTE) The eGFR has been calculated using the CKD EPI equation. This calculation has not been validated in all clinical situations. eGFR's persistently <60 mL/min signify possible Chronic Kidney Disease.    Anion gap 18 (H) 5 - 15  CBC     Status: Abnormal   Collection Time: 03/17/16 10:05 PM  Result Value Ref Range   WBC 9.2 4.0 - 10.5 K/uL   RBC 3.81 (L) 3.87 - 5.11 MIL/uL   Hemoglobin 10.6 (L) 12.0 - 15.0 g/dL   HCT 33.3 (L) 36.0 - 46.0 %   MCV 87.4 78.0 - 100.0 fL   MCH 27.8 26.0 - 34.0 pg   MCHC 31.8 30.0 - 36.0 g/dL   RDW 14.9 11.5 - 15.5 %   Platelets 870 (H) 150 - 400 K/uL  I-Stat CG4 Lactic Acid, ED     Status: None   Collection Time: 03/17/16 10:14 PM  Result Value Ref Range   Lactic Acid, Venous 1.22 0.5 - 1.9 mmol/L  Urinalysis, Routine w reflex microscopic     Status: Abnormal   Collection Time: 03/18/16 12:52 AM  Result Value Ref Range   Color, Urine YELLOW YELLOW   APPearance HAZY (A) CLEAR   Specific Gravity, Urine 1.013 1.005 - 1.030   pH 5.0 5.0 - 8.0   Glucose, UA NEGATIVE NEGATIVE mg/dL   Hgb urine dipstick SMALL (A) NEGATIVE   Bilirubin Urine NEGATIVE NEGATIVE    Ketones, ur NEGATIVE NEGATIVE mg/dL   Protein, ur NEGATIVE NEGATIVE mg/dL   Nitrite NEGATIVE NEGATIVE   Leukocytes, UA TRACE (A) NEGATIVE   RBC / HPF 0-5 0 - 5 RBC/hpf   WBC, UA 0-5 0 - 5 WBC/hpf   Bacteria, UA MANY (A) NONE SEEN   Squamous Epithelial / LPF 0-5 (A) NONE SEEN   Hyaline Casts, UA PRESENT   Sodium, urine, random     Status: None   Collection Time: 03/18/16 12:52 AM  Result Value Ref Range   Sodium, Ur 24 mmol/L  Creatinine, urine, random     Status: None   Collection Time: 03/18/16 12:52 AM  Result Value Ref Range   Creatinine, Urine 142.50 mg/dL  Urine rapid drug screen (hosp performed)not at [facility]     Status: Abnormal   Collection Time: 03/18/16 12:52 AM  Result Value Ref Range   Opiates POSITIVE (A) NONE DETECTED   Cocaine NONE DETECTED NONE DETECTED   Benzodiazepines POSITIVE (A) NONE DETECTED   Amphetamines NONE DETECTED NONE DETECTED   Tetrahydrocannabinol NONE DETECTED NONE DETECTED   Barbiturates NONE DETECTED NONE DETECTED    Comment:        DRUG SCREEN FOR MEDICAL PURPOSES ONLY.  IF CONFIRMATION IS NEEDED FOR ANY PURPOSE, NOTIFY LAB WITHIN 5 DAYS.        LOWEST DETECTABLE LIMITS FOR URINE DRUG SCREEN Drug Class  Cutoff (ng/mL) Amphetamine      1000 Barbiturate      200 Benzodiazepine   354 Tricyclics       656 Opiates          300 Cocaine          300 THC              50   Basic metabolic panel     Status: Abnormal   Collection Time: 03/18/16  6:00 AM  Result Value Ref Range   Sodium 136 135 - 145 mmol/L   Potassium 4.3 3.5 - 5.1 mmol/L   Chloride 104 101 - 111 mmol/L   CO2 16 (L) 22 - 32 mmol/L   Glucose, Bld 99 65 - 99 mg/dL   BUN 79 (H) 6 - 20 mg/dL   Creatinine, Ser 6.05 (H) 0.44 - 1.00 mg/dL   Calcium 7.9 (L) 8.9 - 10.3 mg/dL   GFR calc non Af Amer 6 (L) >60 mL/min   GFR calc Af Amer 7 (L) >60 mL/min    Comment: (NOTE) The eGFR has been calculated using the CKD EPI equation. This calculation has not been validated in all  clinical situations. eGFR's persistently <60 mL/min signify possible Chronic Kidney Disease.    Anion gap 16 (H) 5 - 15  Hepatic function panel     Status: Abnormal   Collection Time: 03/18/16  6:00 AM  Result Value Ref Range   Total Protein 5.4 (L) 6.5 - 8.1 g/dL   Albumin 3.1 (L) 3.5 - 5.0 g/dL   AST 18 15 - 41 U/L   ALT 13 (L) 14 - 54 U/L   Alkaline Phosphatase 63 38 - 126 U/L   Total Bilirubin 0.9 0.3 - 1.2 mg/dL   Bilirubin, Direct <0.1 (L) 0.1 - 0.5 mg/dL   Indirect Bilirubin NOT CALCULATED 0.3 - 0.9 mg/dL  CK     Status: Abnormal   Collection Time: 03/18/16  6:00 AM  Result Value Ref Range   Total CK 258 (H) 38 - 234 U/L  TSH     Status: None   Collection Time: 03/18/16  6:00 AM  Result Value Ref Range   TSH 1.854 0.350 - 4.500 uIU/mL    Comment: Performed by a 3rd Generation assay with a functional sensitivity of <=0.01 uIU/mL.  CBC     Status: Abnormal   Collection Time: 03/18/16  6:00 AM  Result Value Ref Range   WBC 6.9 4.0 - 10.5 K/uL   RBC 3.14 (L) 3.87 - 5.11 MIL/uL   Hemoglobin 8.5 (L) 12.0 - 15.0 g/dL   HCT 27.1 (L) 36.0 - 46.0 %   MCV 86.3 78.0 - 100.0 fL   MCH 27.1 26.0 - 34.0 pg   MCHC 31.4 30.0 - 36.0 g/dL   RDW 14.6 11.5 - 15.5 %   Platelets 789 (H) 150 - 400 K/uL  I-Stat arterial blood gas, ED     Status: Abnormal   Collection Time: 03/18/16  6:00 AM  Result Value Ref Range   pH, Arterial 7.315 (L) 7.350 - 7.450   pCO2 arterial 36.0 32.0 - 48.0 mmHg   pO2, Arterial 74.0 (L) 83.0 - 108.0 mmHg   Bicarbonate 18.3 (L) 20.0 - 28.0 mmol/L   TCO2 19 0 - 100 mmol/L   O2 Saturation 93.0 %   Acid-base deficit 7.0 (H) 0.0 - 2.0 mmol/L   Patient temperature 98.6 F    Collection site RADIAL, ALLEN'S TEST ACCEPTABLE  Drawn by Operator    Sample type ARTERIAL   Lipid panel     Status: None   Collection Time: 03/18/16  7:31 AM  Result Value Ref Range   Cholesterol 152 0 - 200 mg/dL   Triglycerides 64 <150 mg/dL   HDL 55 >40 mg/dL   Total CHOL/HDL Ratio  2.8 RATIO   VLDL 13 0 - 40 mg/dL   LDL Cholesterol 84 0 - 99 mg/dL    Comment:        Total Cholesterol/HDL:CHD Risk Coronary Heart Disease Risk Table                     Men   Women  1/2 Average Risk   3.4   3.3  Average Risk       5.0   4.4  2 X Average Risk   9.6   7.1  3 X Average Risk  23.4   11.0        Use the calculated Patient Ratio above and the CHD Risk Table to determine the patient's CHD Risk.        ATP III CLASSIFICATION (LDL):  <100     mg/dL   Optimal  100-129  mg/dL   Near or Above                    Optimal  130-159  mg/dL   Borderline  160-189  mg/dL   High  >190     mg/dL   Very High   CBC     Status: Abnormal   Collection Time: 03/18/16  7:31 AM  Result Value Ref Range   WBC 7.4 4.0 - 10.5 K/uL   RBC 3.33 (L) 3.87 - 5.11 MIL/uL   Hemoglobin 9.2 (L) 12.0 - 15.0 g/dL   HCT 29.0 (L) 36.0 - 46.0 %   MCV 87.1 78.0 - 100.0 fL   MCH 27.6 26.0 - 34.0 pg   MCHC 31.7 30.0 - 36.0 g/dL   RDW 15.0 11.5 - 15.5 %   Platelets 793 (H) 150 - 400 K/uL  Creatinine, serum     Status: Abnormal   Collection Time: 03/18/16  7:31 AM  Result Value Ref Range   Creatinine, Ser 6.06 (H) 0.44 - 1.00 mg/dL   GFR calc non Af Amer 6 (L) >60 mL/min   GFR calc Af Amer 7 (L) >60 mL/min    Comment: (NOTE) The eGFR has been calculated using the CKD EPI equation. This calculation has not been validated in all clinical situations. eGFR's persistently <60 mL/min signify possible Chronic Kidney Disease.   Vitamin B12     Status: None   Collection Time: 03/18/16  7:31 AM  Result Value Ref Range   Vitamin B-12 266 180 - 914 pg/mL    Comment: (NOTE) This assay is not validated for testing neonatal or myeloproliferative syndrome specimens for Vitamin B12 levels.   Folate     Status: None   Collection Time: 03/18/16  7:31 AM  Result Value Ref Range   Folate 10.1 >5.9 ng/mL  Iron and TIBC     Status: Abnormal   Collection Time: 03/18/16  7:31 AM  Result Value Ref Range   Iron 20  (L) 28 - 170 ug/dL   TIBC 315 250 - 450 ug/dL   Saturation Ratios 6 (L) 10.4 - 31.8 %   UIBC 295 ug/dL  Ferritin     Status: None   Collection Time: 03/18/16  7:31  AM  Result Value Ref Range   Ferritin 73 11 - 307 ng/mL  Reticulocytes     Status: Abnormal   Collection Time: 03/18/16  7:31 AM  Result Value Ref Range   Retic Ct Pct 1.2 0.4 - 3.1 %   RBC. 3.33 (L) 3.87 - 5.11 MIL/uL   Retic Count, Manual 40.0 19.0 - 186.0 K/uL  Ammonia     Status: None   Collection Time: 03/18/16  7:31 AM  Result Value Ref Range   Ammonia 26 9 - 35 umol/L  Salicylate level     Status: None   Collection Time: 03/18/16  7:31 AM  Result Value Ref Range   Salicylate Lvl <5.1 2.8 - 30.0 mg/dL    Dg Chest 1 View  Result Date: 03/18/2016 CLINICAL DATA:  Altered mental status EXAM: CHEST 1 VIEW COMPARISON:  05/09/2014 CXR FINDINGS: Heart is enlarged. The thoracic aorta is tortuous and partially calcified. Mild interstitial prominence is noted, chronic in appearance without pneumonic consolidation, effusion or pneumothorax. Surgical clips project over both humeral heads. Single screw projects over the right scapula and axilla. Spurring is noted off both humeral heads. IMPRESSION: Stable cardiomegaly. No acute pulmonary disease. Aortic atherosclerosis. Electronically Signed   By: Ashley Royalty M.D.   On: 03/18/2016 01:36   Dg Pelvis 1-2 Views  Result Date: 03/18/2016 CLINICAL DATA:  Initial evaluation for acute trauma, fall. EXAM: PELVIS - 1-2 VIEW COMPARISON:  Prior MRI from 01/06/2016. FINDINGS: Postsurgical changes present within the lower lumbar spine and about the right aspect of the sacrum. Bony pelvis intact. SI joints approximated. No pubic diastasis. No acute osseous abnormality about the right hip. Severe degenerative osteoarthritic changes present about the left hip with flattening and sclerosis of the left femoral head, similar to previous, and likely related to degenerative osteoarthritic changes. No  acute fracture identified. Visualized soft tissues within normal limits. IMPRESSION: 1. 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 Changes are grossly similar as compared to previous MRI from 01/06/2016. No definite superimposed acute fracture or dislocation. If there is high clinical suspicion for possible occult hip fracture, further evaluation with dedicated MRI could be performed as indicated. 2. No other acute osseous abnormality about the pelvis. Electronically Signed   By: Jeannine Boga M.D.   On: 03/18/2016 06:46   Dg Forearm Left  Result Date: 03/17/2016 CLINICAL DATA:  76 year old female with fall and pain in the left forearm. EXAM: LEFT FOREARM - 2 VIEW COMPARISON:  Left wrist radiograph dated 03/24/2015 FINDINGS: There is a displaced fracture of the proximal ulna involving the olecranon process with approximately 2 cm distraction. There is a a nondisplaced linear fracture in the proximal ulna extending into the articular surface. No other acute fracture identified. The bones are osteopenic. Distal radial fixation plate and screws noted from prior fracture. There is no dislocation. The radiocapitellar alignment is preserved. There are degenerative changes of the carpal bones. There is soft tissue swelling of the elbow and proximal forearm. No radiopaque foreign object or soft tissue gas. IMPRESSION: Displaced fracture of the olecranon process of the ulna with inter articular extension of the fracture. No dislocation. Additional fractures may be present but not evident due to osteopenia. Dedicated radiograph of the elbow or CT is recommended for further evaluation. Electronically Signed   By: Anner Crete M.D.   On: 03/17/2016 22:26   Ct Head Wo Contrast  Result Date: 03/18/2016 CLINICAL DATA:  Status post fall multiple times, with confusion. Concern for head  or cervical spine injury. Initial encounter. EXAM: CT HEAD WITHOUT CONTRAST CT CERVICAL SPINE WITHOUT CONTRAST  TECHNIQUE: Multidetector CT imaging of the head and cervical spine was performed following the standard protocol without intravenous contrast. Multiplanar CT image reconstructions of the cervical spine were also generated. 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 No evidence of hemorrhage, hydrocephalus, extra-axial collection or mass lesion/mass effect. Prominence of the ventricles and sulci reflects mild cortical volume loss. Scattered periventricular and subcortical white matter change likely reflects small vessel ischemic microangiopathy. The brainstem and fourth ventricle are within normal limits. The basal ganglia are unremarkable in appearance. The cerebral hemispheres demonstrate grossly normal gray-white differentiation. No mass effect or midline shift is seen. Vascular: No hyperdense vessel or unexpected calcification. Skull: There is no evidence of fracture; visualized osseous structures are unremarkable in appearance. Sinuses/Orbits: The orbits are within normal limits. There is mild partial opacification of the sphenoid sinus. The remaining paranasal sinuses and mastoid air cells are well-aerated. Other: No significant soft tissue abnormalities are seen. CT CERVICAL SPINE FINDINGS Alignment: There is grade 1 anterolisthesis of C3 on C4. Underlying facet disease is noted. Skull base and vertebrae: No acute fracture. No primary bone lesion or focal pathologic process. Soft tissues and spinal canal: No prevertebral fluid or swelling. No visible canal hematoma. Disc levels: Multilevel disc space narrowing is noted along the lower cervical spine, with enlarged anterior disc osteophyte complexes. Mild underlying facet disease is noted. Upper chest: Hypodensities within the thyroid gland measure up to 1.6  cm on the left. The visualized lung apices are grossly clear. Scattered calcification is seen at the left lung apex. Other: No additional soft tissue abnormalities are seen. IMPRESSION: 1. No evidence of traumatic intracranial injury or fracture. 2. No evidence of acute fracture or subluxation along the cervical spine. 3. 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 Would correlate for any associated symptoms. 4. Mild cortical volume loss and scattered small vessel ischemic microangiopathy. 5. Mild degenerative change along the cervical spine, with grade 1 anterolisthesis of C3 on C4. 6. Mild partial opacification of the sphenoid sinus. 7. **An incidental finding of potential clinical significance has been found. Hypodensities within the thyroid gland measure up to 1.6 cm on the left. Consider further evaluation with thyroid ultrasound. If patient is clinically hyperthyroid, consider nuclear medicine thyroid uptake and scan.** Electronically Signed   By: Garald Balding M.D.   On: 03/18/2016 01:35   Ct Cervical Spine Wo Contrast  Result Date: 03/18/2016 CLINICAL DATA:  Status post fall multiple times, with confusion. Concern for head or cervical spine injury. Initial encounter. EXAM: CT HEAD WITHOUT CONTRAST CT CERVICAL SPINE WITHOUT CONTRAST TECHNIQUE: Multidetector CT imaging of the head and cervical spine was performed following the standard protocol without intravenous contrast. Multiplanar CT image reconstructions of the cervical spine were also generated. 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 No evidence of hemorrhage, hydrocephalus, extra-axial collection or mass lesion/mass effect. Prominence of the ventricles and sulci reflects mild cortical  volume loss. Scattered periventricular and subcortical white matter change likely reflects small  vessel ischemic microangiopathy. The brainstem and fourth ventricle are within normal limits. The basal ganglia are unremarkable in appearance. The cerebral hemispheres demonstrate grossly normal gray-white differentiation. No mass effect or midline shift is seen. Vascular: No hyperdense vessel or unexpected calcification. Skull: There is no evidence of fracture; visualized osseous structures are unremarkable in appearance. Sinuses/Orbits: The orbits are within normal limits. There is mild partial opacification of the sphenoid sinus. The remaining paranasal sinuses and mastoid air cells are well-aerated. Other: No significant soft tissue abnormalities are seen. CT CERVICAL SPINE FINDINGS Alignment: There is grade 1 anterolisthesis of C3 on C4. Underlying facet disease is noted. Skull base and vertebrae: No acute fracture. No primary bone lesion or focal pathologic process. Soft tissues and spinal canal: No prevertebral fluid or swelling. No visible canal hematoma. Disc levels: Multilevel disc space narrowing is noted along the lower cervical spine, with enlarged anterior disc osteophyte complexes. Mild underlying facet disease is noted. Upper chest: Hypodensities within the thyroid gland measure up to 1.6 cm on the left. The visualized lung apices are grossly clear. Scattered calcification is seen at the left lung apex. Other: No additional soft tissue abnormalities are seen. IMPRESSION: 1. No evidence of traumatic intracranial injury or fracture. 2. No evidence of acute fracture or subluxation along the cervical spine. 3. 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 Would correlate for any associated symptoms. 4. Mild cortical volume loss and scattered small vessel ischemic microangiopathy. 5. Mild degenerative change along the cervical spine, with grade 1  anterolisthesis of C3 on C4. 6. Mild partial opacification of the sphenoid sinus. 7. **An incidental finding of potential clinical significance has been found. Hypodensities within the thyroid gland measure up to 1.6 cm on the left. Consider further evaluation with thyroid ultrasound. If patient is clinically hyperthyroid, consider nuclear medicine thyroid uptake and scan.** Electronically Signed   By: Garald Balding M.D.   On: 03/18/2016 01:35   Mr Brain Wo Contrast  Result Date: 03/18/2016 CLINICAL DATA:  Fall and confusion. EXAM: MRI HEAD WITHOUT CONTRAST TECHNIQUE: Multiplanar, multiecho pulse sequences of the brain and surrounding structures were obtained without intravenous contrast. COMPARISON:  10/07/2003 FINDINGS: Brain: No acute infarction, hemorrhage, hydrocephalus, extra-axial collection or mass lesion. 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 Confluent gliosis in the cerebral white matter attributed to chronic microvascular disease given comorbidities and patient age. These changes have significantly progressed since 2005. Milder microvascular ischemic change in the pons. Normal brain volume. Vascular: Preserved flow voids Skull and upper cervical spine: No acute finding Sinuses/Orbits: Bilateral cataract resection. Chronic fluid and/or mucosal thickening in the right mastoid air cells. Other: Intermittently motion degraded exam which decreases diagnostic sensitivity. IMPRESSION: 1. No acute finding. 2. 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 3. 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 Electronically Signed   By: Monte Fantasia M.D.   On: 03/18/2016 09:23   US Renal  Result Date: 03/18/2016 CLINICAL DATA:  76 year old female with acute renal failure. EXAM: RENAL / URINARY TRACT ULTRASOUND COMPLETE COMPARISON:  None. FINDINGS: Right Kidney: Length: 11 cm. There is increased  renal echotexture. No hydronephrosis or echogenic stone. Left Kidney: Length: 11 cm. There is increased renal echogenicity. There is a 1.0 x 0.8 x 1.3 cm cyst in the left kidney. Bladder: Appears normal for degree of bladder distention. IMPRESSION: Increased renal echotexture compatible with underlying chronic medical renal  disease. No hydronephrosis or echogenic stone. Electronically Signed   By: Anner Crete M.D.   On: 03/18/2016 03:22    ROS extensive as noted above Blood pressure 113/62, pulse 77, temperature 98.5 F (36.9 C), resp. rate 12, SpO2 91 %. Physical Exam  NCAT Patient alert and speaking well; slight but frequent shaking LUEx   Sling and splint in place   Wrist, digits- no skin wounds, nontender  Sens  Ax/R/M/U intact  Mot   Ax/ R/ PIN/ M/ AIN/ U intact  Rad 2+  chronic arthritic changes RUEx shoulder, elbow, wrist, digits- no skin wounds, nontender, no instability, no blocks to motion  Sens  Ax/R/M/U intact  Mot   Ax/ R/ PIN/ M/ AIN/ U intact  Rad 2+  chronic arthritic changes LLE No traumatic wounds, ecchymosis, or rash  Nontender  Knee stable to varus/ valgus and anterior/posterior stress  Sens DPN, SPN, TN intact  Motor EHL, ext, flex, evers 5/5  DP 2+, No significant edema RLE No traumatic wounds, ecchymosis, or rash  Nontender  Knee stable to varus/ valgus and anterior/posterior stress  Sens DPN, SPN, TN intact  Motor EHL, ext, flex, evers 5/5  DP 2+, No significant edema  Assessment/Plan: Displaced and comminuted left olecranon fracture  1. Will plan for ORIF Monday pm, which should provide adequate time for renal recovery; recommend regional block 2. Will follow  I discussed with the patient and her daughter the risks and benefits of surgery, including the possibility of infection, nerve injury, vessel injury, wound breakdown, arthritis, symptomatic hardware, DVT/ PE, loss of motion, and need for further surgery among others.  They acknowledged these  risks and wished to proceed.   Altamese Hoskins, MD [facility] [phone] (p)  03/18/2016  6:54 PM

## 2016-03-18 NOTE — Progress Notes (Signed)
  Echocardiogram 2D Echocardiogram has been performed.  Jennette Dubin 03/18/2016, 2:48 PM

## 2016-03-18 NOTE — ED Notes (Signed)
Patient transported to Ultrasound 

## 2016-03-19 DIAGNOSIS — S42402A Unspecified fracture of lower end of left humerus, initial encounter for closed fracture: Secondary | ICD-10-CM

## 2016-03-19 LAB — URINE CULTURE

## 2016-03-19 LAB — BASIC METABOLIC PANEL
Anion gap: 10 (ref 5–15)
BUN: 74 mg/dL — AB (ref 6–20)
CO2: 19 mmol/L — ABNORMAL LOW (ref 22–32)
Calcium: 7.8 mg/dL — ABNORMAL LOW (ref 8.9–10.3)
Chloride: 109 mmol/L (ref 101–111)
Creatinine, Ser: 5.03 mg/dL — ABNORMAL HIGH (ref 0.44–1.00)
GFR calc Af Amer: 9 mL/min — ABNORMAL LOW (ref >60)
GFR, EST NON AFRICAN AMERICAN: 8 mL/min — AB (ref >60)
GLUCOSE: 90 mg/dL (ref 65–99)
POTASSIUM: 4.2 mmol/L (ref 3.5–5.1)
Sodium: 138 mmol/L (ref 135–145)

## 2016-03-19 LAB — PROTEIN ELECTROPHORESIS, SERUM
A/G RATIO SPE: 1.3 (ref 0.7–1.7)
ALPHA-1-GLOBULIN: 0.3 g/dL (ref 0.0–0.4)
ALPHA-2-GLOBULIN: 0.7 g/dL (ref 0.4–1.0)
Albumin ELP: 3.1 g/dL (ref 2.9–4.4)
Beta Globulin: 0.7 g/dL (ref 0.7–1.3)
GLOBULIN, TOTAL: 2.3 g/dL (ref 2.2–3.9)
Gamma Globulin: 0.6 g/dL (ref 0.4–1.8)
PDF SPE: 0
Total Protein ELP: 5.4 g/dL — ABNORMAL LOW (ref 6.0–8.5)

## 2016-03-19 LAB — CBC
HEMATOCRIT: 28.2 % — AB (ref 36.0–46.0)
Hemoglobin: 8.8 g/dL — ABNORMAL LOW (ref 12.0–15.0)
MCH: 27.2 pg (ref 26.0–34.0)
MCHC: 31.2 g/dL (ref 30.0–36.0)
MCV: 87.3 fL (ref 78.0–100.0)
Platelets: 763 10*3/uL — ABNORMAL HIGH (ref 150–400)
RBC: 3.23 MIL/uL — ABNORMAL LOW (ref 3.87–5.11)
RDW: 14.9 % (ref 11.5–15.5)
WBC: 6.7 10*3/uL (ref 4.0–10.5)

## 2016-03-19 LAB — HEMOGLOBIN A1C
Hgb A1c MFr Bld: 6 % — ABNORMAL HIGH (ref 4.8–5.6)
Mean Plasma Glucose: 126 mg/dL

## 2016-03-19 LAB — IMMUNOFIXATION, URINE

## 2016-03-19 MED ORDER — SODIUM CHLORIDE 0.9 % IV SOLN: INTRAVENOUS | Status: DC

## 2016-03-19 MED ORDER — ASPIRIN EC 81 MG PO TBEC
81.0000 mg | DELAYED_RELEASE_TABLET | Freq: Every day | ORAL | Status: DC
  Administered 2016-03-20 – 2016-03-24 (×5): 81 mg via ORAL
  Filled 2016-03-19 (×5): qty 1

## 2016-03-19 MED ORDER — CYANOCOBALAMIN 1000 MCG/ML IJ SOLN
1000.0000 ug | Freq: Every day | INTRAMUSCULAR | Status: AC
Start: 2016-03-19 — End: 2016-03-21
  Administered 2016-03-19 – 2016-03-21 (×3): 1000 ug via INTRAMUSCULAR
  Filled 2016-03-19 (×3): qty 1

## 2016-03-19 MED ORDER — DOCUSATE SODIUM 100 MG PO CAPS
100.0000 mg | ORAL_CAPSULE | Freq: Every day | ORAL | Status: DC
  Administered 2016-03-20 – 2016-03-24 (×4): 100 mg via ORAL
  Filled 2016-03-19 (×5): qty 1

## 2016-03-19 MED ORDER — SODIUM CHLORIDE 0.9 % IV SOLN
510.0000 mg | Freq: Once | INTRAVENOUS | Status: AC
  Administered 2016-03-19: 510 mg via INTRAVENOUS
  Filled 2016-03-19: qty 17

## 2016-03-19 NOTE — Evaluation (Signed)
Occupational Therapy Evaluation Patient Details Name: Shirley [name] MRN: [id number] DOB: 1940/03/21 Today's Date: 03/19/2016    History of Present Illness Pt admitted with frequent falls, confusion, UTI, acute renal failure and L olecranon fx. Plan for ORIF of L elbow 1/29. Neuro consult pending, possible subacute CVA. PMH: multiple orthopedic surgeries, tachycardia, neuropathy, depression, fibromyalgia.   Clinical Impression   Pt was independent in self care and ambulated with a cane prior to admission. 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 She presents with chronic L hip pain, generalized weakness, decreased balance and no functional use of her L UE. 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 Pt currently requires 2 person assist to attempt to ambulate. She is not able to use a walker due to L olecranon fx and is a high fall risk. Will follow.     Follow Up Recommendations  CIR;Supervision/Assistance - 24 hour    Equipment Recommendations  3 in 1 bedside commode    Recommendations for Other Services Rehab consult     Precautions / Restrictions Precautions Precautions: Fall Required Braces or Orthoses: Sling Restrictions Other Position/Activity Restrictions: no current weight bearing restrictions, but adhered to NWB L UE.      Mobility Bed Mobility               General bed mobility comments: pt in chair  Transfers Overall transfer level: Needs assistance   Transfers: Sit to/from Stand Sit to Stand: +2 physical assistance;Min assist         General transfer comment: stood from chair with assist to rise and steady    Balance Overall balance assessment: Needs assistance   Sitting balance-Leahy Scale: Fair       Standing balance-Leahy Scale: Poor                              ADL Overall ADL's : Needs assistance/impaired Eating/Feeding: Set up;Sitting   Grooming: Wash/dry hands;Wash/dry  face;Sitting;Set up   Upper Body Bathing: Sitting;Maximal assistance   Lower Body Bathing: Maximal assistance;Sit to/from stand   Upper Body Dressing : Sitting;Maximal assistance   Lower Body Dressing: Maximal assistance;Sit to/from stand   Toilet Transfer: +2 for physical assistance;Minimal assistance;Stand-pivot             General ADL Comments: pt with longstanding tremor     Vision Additional Comments: wears contacts    Perception     Praxis      Pertinent Vitals/Pain Pain Assessment: 0-10 Pain Score: 4  Pain Location: L hip Pain Descriptors / Indicators: Aching Pain Intervention(s): RN gave pain meds during session     Hand Dominance Right   Extremity/Trunk Assessment Upper Extremity Assessment Upper Extremity Assessment: LUE deficits/detail;RUE deficits/detail RUE Deficits / Details: shoulder limitations longstanding, arthritic changes in hand, tremor RUE Coordination: decreased gross motor;decreased fine motor LUE Deficits / Details: olecranon fx, pt in sling/splint, did not assess shoulder, arthritic changes in hand LUE Coordination: decreased gross motor;decreased fine motor   Lower Extremity Assessment Lower Extremity Assessment: Defer to PT evaluation   Cervical / Trunk Assessment Cervical / Trunk Assessment: Other exceptions Cervical / Trunk Exceptions: hx of multiple back surgeries   Communication Communication Communication: No difficulties   Cognition Arousal/Alertness: Awake/alert Behavior During Therapy: WFL for tasks assessed/performed Overall Cognitive Status: Within Functional Limits for tasks assessed  General Comments: pt able to answer PLOF questions, no family available to confirm   General Comments       Exercises       Shoulder Instructions      Home Living Family/patient expects to be discharged to:: Private residence Living Arrangements: Spouse/significant other (pt has had a stroke, cannot  provide assist) Available Help at Discharge: Family;Available PRN/intermittently Type of Home: House Home Access: Stairs to enter [employer] of Steps: 3 Entrance Stairs-Rails: Right;Left;Can reach both Home Layout: One level     Bathroom Shower/Tub: Occupational psychologist: Handicapped height     Home Equipment: Environmental consultant - 2 wheels;Cane - single point;Bedside commode      Lives With: Spouse    Prior Functioning/Environment Level of Independence: Independent with assistive device(s)        Comments: walked with a cane, had housekeeping assist        OT Problem List: Decreased strength;Decreased range of motion;Decreased activity tolerance;Impaired balance (sitting and/or standing);Decreased coordination;Decreased knowledge of use of DME or AE;Pain;Impaired UE functional use   OT Treatment/Interventions: Self-care/ADL training;Therapeutic activities;Patient/family education;Balance training;DME and/or AE instruction    OT Goals(Current goals can be found in the care plan section) Acute Rehab OT Goals Patient Stated Goal: to return to PLOF OT Goal Formulation: With patient Time For Goal Achievement: 04/02/16 Potential to Achieve Goals: Good ADL Goals Pt Will Perform Grooming: (P) with supervision;standing  OT Frequency: Min 2X/week   Barriers to D/C:            Co-evaluation PT/OT/SLP Co-Evaluation/Treatment: Yes Reason for Co-Treatment: For patient/therapist safety   OT goals addressed during session: ADL's and self-care      End of Session Equipment Utilized During Treatment: Gait belt Nurse Communication: Patient requests pain meds;Mobility status  Activity Tolerance: Patient tolerated treatment well Patient left: in chair;with call bell/phone within reach;with nursing/sitter in room   Time: 4982-6415 OT Time Calculation (min): 32 min Charges:  OT General Charges $OT Visit: 1 Procedure OT Evaluation $OT Eval Moderate Complexity: 1  Procedure G-Codes:    Malka So 03/19/2016, 11:39 AM  [phone]

## 2016-03-19 NOTE — Evaluation (Signed)
Physical Therapy Evaluation Patient Details Name: Shirley [name] MRN: [id number] DOB: 18-May-1940 Today's Date: 03/19/2016   History of Present Illness  Pt admitted with frequent falls, confusion, UTI, acute renal failure and L olecranon fx. Plan for ORIF of L elbow 1/29. Neuro consult pending, possible subacute CVA. PMH: multiple orthopedic surgeries, tachycardia, neuropathy, depression, fibromyalgia.  Clinical Impression  Pt presented sitting OOB in recliner when therapists entered room. Prior to admission, pt reported that she was independent with all functional mobility and ADLs. She also stated that she is primary caregiver for her husband that has a hx of CVA. Pt currently requires min A x2 for safety with transfers. Pt participated in pre-gait activities in standing but was limited secondary to significant pain in her L hip. Pt would continue to benefit from skilled physical therapy services at this time while admitted and after d/c to address her below listed limitations in order to improve her overall safety and independence with functional mobility.    Follow Up Recommendations CIR    Equipment Recommendations  None recommended by PT    Recommendations for Other Services Rehab consult     Precautions / Restrictions Precautions Precautions: Fall Required Braces or Orthoses: Sling Restrictions Other Position/Activity Restrictions: no current weight bearing restrictions, but adhered to NWB L UE.      Mobility  Bed Mobility               General bed mobility comments: pt OOB in recliner when PT entered room  Transfers Overall transfer level: Needs assistance Equipment used: None Transfers: Sit to/from Stand Sit to Stand: +2 physical assistance;Min assist         General transfer comment: pt required increased time, use of R UE on recliner arm rest, and min A x2 for physical assist and stability upon standing from recliner   Ambulation/Gait              General Gait Details: pt able to participate in pre-gait activities including lateral weight shifting and stepping in place. Ambulation deferred at this time secondary to significant L hip pain (10/10)  Stairs            Wheelchair Mobility    Modified Rankin (Stroke Patients Only)       Balance Overall balance assessment: Needs assistance Sitting-balance support: Feet supported Sitting balance-Leahy Scale: Fair     Standing balance support: During functional activity;Single extremity supported Standing balance-Leahy Scale: Poor                               Pertinent Vitals/Pain Pain Assessment: 0-10 Pain Score: 10-Worst pain ever Pain Location: L hip Pain Descriptors / Indicators: Aching Pain Intervention(s): Monitored during session;Repositioned    Home Living Family/patient expects to be discharged to:: Private residence Living Arrangements: Spouse/significant other (pt has had a stroke, cannot provide assist) Available Help at Discharge: Family;Available PRN/intermittently Type of Home: House Home Access: Stairs to enter Entrance Stairs-Rails: Right;Left;Can reach both Entrance Stairs-Number of Steps: 3 Home Layout: One level Home Equipment: Walker - 2 wheels;Cane - single point;Bedside commode      Prior Function Level of Independence: Independent with assistive device(s)         Comments: walked with a cane, had housekeeping assist     Hand Dominance   Dominant Hand: Right    Extremity/Trunk Assessment   Upper Extremity Assessment Upper Extremity Assessment: Defer to OT evaluation RUE Deficits /  Details: shoulder limitations longstanding, arthritic changes in hand, tremor RUE Coordination: decreased gross motor;decreased fine motor LUE Deficits / Details: olecranon fx, pt in sling/splint, did not assess shoulder, arthritic changes in hand LUE Coordination: decreased gross motor;decreased fine motor    Lower Extremity  Assessment Lower Extremity Assessment: LLE deficits/detail LLE Deficits / Details: pt with reported chronic L hip pain LLE: Unable to fully assess due to pain    Cervical / Trunk Assessment Cervical / Trunk Assessment: Other exceptions Cervical / Trunk Exceptions: hx of multiple back surgeries  Communication   Communication: No difficulties  Cognition Arousal/Alertness: Awake/alert Behavior During Therapy: WFL for tasks assessed/performed Overall Cognitive Status: Within Functional Limits for tasks assessed                 General Comments: pt able to answer PLOF questions, no family available to confirm    General Comments      Exercises     Assessment/Plan    PT Assessment Patient needs continued PT services  PT Problem List Decreased strength;Decreased activity tolerance;Decreased balance;Decreased mobility;Decreased coordination;Decreased knowledge of use of DME;Decreased knowledge of precautions;Decreased safety awareness;Pain          PT Treatment Interventions DME instruction;Gait training;Stair training;Functional mobility training;Therapeutic activities;Therapeutic exercise;Balance training;Neuromuscular re-education;Patient/family education    PT Goals (Current goals can be found in the Care Plan section)  Acute Rehab PT Goals Patient Stated Goal: to return to PLOF PT Goal Formulation: With patient Time For Goal Achievement: 04/02/16 Potential to Achieve Goals: Fair    Frequency Min 3X/week   Barriers to discharge        Co-evaluation PT/OT/SLP Co-Evaluation/Treatment: Yes Reason for Co-Treatment: For patient/therapist safety;To address functional/ADL transfers PT goals addressed during session: Mobility/safety with mobility;Balance;Proper use of DME;Strengthening/ROM OT goals addressed during session: ADL's and self-care       End of Session   Activity Tolerance: Patient limited by pain Patient left: in chair;with call bell/phone within  reach Nurse Communication: Mobility status         Time: 1937-9024 PT Time Calculation (min) (ACUTE ONLY): 32 min   Charges:   PT Evaluation $PT Eval Moderate Complexity: 1 Procedure     PT G CodesClearnce Sorrel Henson Fraticelli 03/19/2016, 11:51 AM Sherie Don, PT, DPT [phone]
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
## 2016-03-19 NOTE — Care Management Note (Signed)
Case Management Note  Patient Details  Name: Shirley [name] MRN: [id number] Date of Birth: Jul 16, 1940  Subjective/Objective:  Presents with multiple falls, l elbow fx, on schedule for Monday for surgery, pain management , CIR following.  NCM will cont to follow for dc needs.                  Action/Plan:   Expected Discharge Date:                  Expected Discharge Plan:     In-House Referral:     Discharge planning Services  CM Consult  Post Acute Care Choice:    Choice offered to:     DME Arranged:    DME Agency:     HH Arranged:    HH Agency:     Status of Service:  In process, will continue to follow  If discussed at Long Length of Stay Meetings, dates discussed:    Additional Comments:  Zenon Mayo, RN 03/19/2016, 4:45 PM

## 2016-03-19 NOTE — Evaluation (Signed)
Speech Language Pathology Evaluation Patient Details Name: Shirley [name] MRN: [id number] DOB: 1940-02-23 Today's Date: 03/19/2016 Time: 9509-3267 SLP Time Calculation (min) (ACUTE ONLY): 24 min  Problem List:  Patient Active Problem List   Diagnosis Date Noted  . Acute renal failure ([place]) 03/18/2016  . Acute encephalopathy 03/18/2016  . Elbow fracture, left, closed, initial encounter 03/18/2016  . 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 Stroke ([place]) 03/18/2016  . Acute pain of left hip 12/23/2015  . UTI (urinary tract infection) 12/23/2015  . DNR (do not resuscitate) discussion 12/23/2015  . Palliative care by specialist 12/23/2015  . Chronic pain syndrome 12/23/2015  . Left hip pain 12/23/2015  . Effusion of left hip   . Lytic bone lesions on xray   . Exertional dyspnea 09/07/2012  . Varicose veins of both lower extremities with pain 08/27/2012  . Lower extremity edema 08/27/2012  . Abnormal resting ECG findings - sinus rhythm with PACs, and PAT 08/19/2012  . Aortic systolic murmur on examination 08/19/2012  . PAT (paroxysmal atrial tachycardia) / MAT 08/19/2012   Past Medical History:  Past Medical History:  Diagnosis Date  . Allergy    vasomotor rhinitis  . Asthma    h/o asthma and bronchitis  . BV (bacterial vaginosis)    history of frequent  . Cervical spondylosis    herniated disk protruding @ C6-7, impinging cord and left axillary root sleeve.  . Chronic pain   . Depression   . DVT of upper extremity (deep vein thrombosis) ([place])    h/o left(after wrist fracture/surgery); no residual thrombus or phlebitis by Dopplers July 2014  . Dyspnea    with exertion  . Edema   . Fibromyalgia    sees Dr.Phillips-pain management  . GERD (gastroesophageal reflux disease)    h/o  . Headache    marigraine- hormal - none in years  . Hypertension   . Neuropathy ([place])   . Neuropathy ([place])   . Osteopenia    mild at hip (T-1.3)  . PAT (paroxysmal atrial tachycardia) ([place])   .  Varicose veins   . 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 Eilleen Kempf @ [facility]  . Vision problem    wears glasses  . Vitamin D deficiency    Past Surgical History:  Past Surgical History:  Procedure Laterality Date  . ABDOMINAL HYSTERECTOMY  1999   BSO for endometriosis  . APPENDECTOMY    . BACK SURGERY  age 26   ruptured disk L3-4   . CATARACT EXTRACTION  2000   B/L  . COLONOSCOPY    . DOPPLER ECHOCARDIOGRAPHY  July 2012   Normal EF 60-65% , mild concentric LVH. Grade 2 diastolic dysfunction with elevated EDP. Massive left atrial dilation. Pulmonary pressures 30-40 mm Hg; aortic sclerosis but no stenosis. Moderate TR  . FINGER SURGERY     left finger for tender rupture from fall.  Marland Kitchen FOOT SURGERY     multiple(at least 4 on right, 5 on left)-left Dr.Kerner([place])11/08,left Dr.Nunley-excision 2nd and 3rd mt heads w/artho and hammertoe surgery 4th and 5th 04/2006. left great toe IP fusion and revision of fusion of 2nd through 5th toes 12/2005. Right foot surgeries  Dr.Bednarz 04/2008 and 04/2009.  Marland Kitchen KNEE SURGERY Right    right knee replacement  . NECK  SURGERY  2007   cervical  . RADIOLOGY WITH ANESTHESIA Left 01/06/2016   Procedure: MRI LEFT HIP WITH AND WITHOUT;  Surgeon: Medication Radiologist, MD;  Location: [facility];  Service: Radiology;  Laterality: Left;  . REFRACTIVE SURGERY  2007   left eye  . SHOULDER SURGERY     x8 (4 on rt side and 4 on left side)  . SPINAL FUSION  6/09   Dr.Cohen  . TONSILLECTOMY AND ADENOIDECTOMY    . WRIST SURGERY Left    2 surgeries left wrist after fracture(complicated by DVT)   HPI:  Shirley J Autryis a 76 y.o.femalewith history of chronic pain and arthritis was recently placed on indomethacin last 2 weeks was brought to the ER after patient  was found to be confused and had a fall. As per patient's daughter patient has been having frequent falls last 2 to3 days. In addition patient is also found to be confused with some myoclonic jerks. In the ER patient's creatinine is found to be around 6 with anion gap of 18. Patient's creatinine in November 2017 was normal. UA does not show any definite casts. Shows possible UTI. Patient denies any nausea vomiting diarrhea. After the fall patient had left elbow pain and x-ray showed left olecranon fracture. CT of the head shows possible subacute occipital infarct. Patient is being admitted for acute encephalopathy with acute renal failure. On exam patient is able to move all extremities has myoclonic jerks with periods of confusion.  Previous ST work up at [facility] was not noted.  Most recent MRI is negative for acute findings.     Assessment / Plan / Recommendation Clinical Impression  Cognitive/linguistic and motor speech screening were completed.  The patient's speech was clear and easy to understand.  Oral mechanism exam was unremarkable.  The patient achieved a score of 25/30 on the Mini Mental State Exam.  She was oriented to person, place and situation.  She knew the month, year and day of week but not the actual date.  Immediate and delayed recall was functional.  Attention to task was goo and the patient was able to name objects, repeat a short phrase, follow a 3 step command, read and understand a short phrase and write a short sentence.  She was able to solve simple problems and had good insight into her current situation.  Obvious language/cognitive needs are not identified.  Given this ST follow up is not indcated at this time.        SLP Assessment  Patient does not need any further Speech Lanaguage Pathology Services             SLP Evaluation Cognition  Overall Cognitive Status: Within Functional Limits for tasks assessed Arousal/Alertness: Awake/alert Orientation Level: Oriented  X4 Attention: Focused Focused Attention: Appears intact Memory: Appears intact Awareness: Appears intact Problem Solving: Appears intact Safety/Judgment: Appears intact       Comprehension  Auditory Comprehension Overall Auditory Comprehension: Appears within functional limits for tasks assessed Commands: Within Functional Limits Conversation: Complex Reading Comprehension Reading Status: Within funtional limits    Expression Expression Primary Mode of Expression: Verbal Verbal Expression Overall Verbal Expression: Appears within functional limits for tasks assessed Initiation: No impairment Automatic Speech: Name;Social Response Level of Generative/Spontaneous Verbalization: Conversation Repetition: No impairment Naming: No impairment Pragmatics: No impairment Non-Verbal Means of Communication: Not applicable Written Expression Dominant Hand: Right Written Expression: Within Functional Limits   Oral / Motor  Oral Motor/Sensory Function Overall Oral Motor/Sensory Function: Within functional  limits Motor Speech Overall Motor Speech: Appears within functional limits for tasks assessed Respiration: Within functional limits Phonation: Normal Resonance: Within functional limits Articulation: Within functional limitis Intelligibility: Intelligible Motor Planning: Witnin functional limits Motor Speech Errors: Not applicable   [place], MA, [facility] SLP [phone] Lamar Sprinkles 03/19/2016, 11:34 AM

## 2016-03-19 NOTE — Consult Note (Signed)
[facility] Consultation Note Requesting Physician:  Dr. Waldron Labs Reason for Consult:  Acute kidney injury  HPI: The patient is a 76 y.o. year-old with longstanding arthritis, h/o multiple orthopedic surgeries over the years, chronic edema, HTN, neuropathy (gabapentin), fibromyalgia, chronic pain. Brought to the ED by her daughter after multiple falls, with back pain, elbow pain and confusion. Was taking a number of medications including xanax, gabapentin, oxycodone, indomethacin (started about 2 weeks ago by Dr. Cristal Deer and taking BID) with med rec that also included ibuprofen (she tells me she wasn't taking the ibuprofen). Was found to have a creatinine of 6.42 (0.96 Nov 1), bicarb of 16, Hb 8.5. XRays showed L  Olecranon fracture. Was admitted, given IVF's at 100cc/hour with creatinine today down to 5.03 and making urine. However, because of LE edema IV's stopped (pt says has had edema for "years" and takes small dose of torsemide for this). UA neg for blood or protein, [place] 11 cm kidneys bilaterally, echodense, L simple cyst. We were asked to see. No hypotension documented. No ACE or ARB   Creatinine, Ser summary for reference  Date/Time Value Ref Range Status  03/19/2016 03:05 AM 5.03 (H) 0.44 - 1.00 mg/dL Final  03/18/2016 07:31 AM 6.06 (H) 0.44 - 1.00 mg/dL Final  03/18/2016 06:00 AM 6.05 (H) 0.44 - 1.00 mg/dL Final  03/17/2016 10:05 PM 6.42 (H) 0.44 - 1.00 mg/dL Final  12/24/2015 06:08 AM 0.96 0.44 - 1.00 mg/dL Final  12/22/2015 05:36 PM 1.22 (H) 0.44 - 1.00 mg/dL Final  08/28/2008 04:25 AM 0.77 0.4 - 1.2 mg/dL Final  08/27/2008 02:42 PM 0.70 0.4 - 1.2 mg/dL Final    Past Medical History:  Diagnosis Date  . Allergy    vasomotor rhinitis  . Asthma    h/o asthma and bronchitis  . BV (bacterial vaginosis)    history of frequent  . Cervical spondylosis    herniated disk protruding @ C6-7, impinging cord and left axillary root sleeve.  . Chronic pain   . Depression   . DVT of upper  extremity (deep vein thrombosis) ([place])    h/o left(after wrist fracture/surgery); no residual thrombus or phlebitis by Dopplers July 2014  . Dyspnea    with exertion  . Edema   . Fibromyalgia    sees Dr.Phillips-pain management  . GERD (gastroesophageal reflux disease)    h/o  . Headache    marigraine- hormal - none in years  . Hypertension   . Neuropathy ([place])   . Neuropathy ([place])   . Osteopenia    mild at hip (T-1.3)  . PAT (paroxysmal atrial tachycardia) ([place])   . Varicose veins   . 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 Eilleen Kempf @ [facility]  . Vision problem    wears glasses  . Vitamin D deficiency      Past Surgical History:  Procedure Laterality Date  . ABDOMINAL HYSTERECTOMY  1999   BSO for endometriosis  . APPENDECTOMY    . BACK SURGERY  age 38   ruptured disk L3-4   . CATARACT EXTRACTION  2000   B/L  . COLONOSCOPY    . DOPPLER ECHOCARDIOGRAPHY  July 2012   Normal EF 60-65% , mild concentric LVH. Grade 2 diastolic dysfunction with elevated EDP. Massive left atrial dilation. Pulmonary pressures  30-40 mm Hg; aortic sclerosis but no stenosis. Moderate TR  . FINGER SURGERY     left finger for tender rupture from fall.  Marland Kitchen FOOT SURGERY     multiple(at least 4 on right, 5 on left)-left Dr.Kerner([place])11/08,left Dr.Nunley-excision 2nd and 3rd mt heads w/artho and hammertoe surgery 4th and 5th 04/2006. left great toe IP fusion and revision of fusion of 2nd through 5th toes 12/2005. Right foot surgeries  Dr.Bednarz 04/2008 and 04/2009.  Marland Kitchen KNEE SURGERY Right    right knee replacement  . NECK SURGERY  2007   cervical  . RADIOLOGY WITH ANESTHESIA Left 01/06/2016   Procedure: MRI LEFT HIP WITH AND WITHOUT;  Surgeon: Medication Radiologist, MD;  Location: [place];  Service:  Radiology;  Laterality: Left;  . REFRACTIVE SURGERY  2007   left eye  . SHOULDER SURGERY     x8 (4 on rt side and 4 on left side)  . SPINAL FUSION  6/09   Dr.Cohen  . TONSILLECTOMY AND ADENOIDECTOMY    . WRIST SURGERY Left    2 surgeries left wrist after fracture(complicated by DVT)    Family History  Problem Relation Age of Onset  . Stroke Mother   . Osteoarthritis Mother     Died at age 77  . Heart disease Father   . Lung cancer Father   . Heart failure Father     Died at age 3  . Osteoporosis Sister   . Diabetes Brother   . Heart attack Brother 7  . Diabetes Brother   . Diabetes Brother   . Heart attack Brother 38  . Breast cancer Paternal Aunt   . Heart failure Maternal Grandmother     Died at age 78, unsure of her heart disease  . Heart failure Paternal Grandfather     Died at age 62, unsure of cardiac disease.   Social History:  reports that she has been smoking.  She has never used smokeless tobacco. She reports that she does not drink alcohol or use drugs.   Allergies  Allergen Reactions  . No Known Allergies     Home medications: Prior to Admission medications   Medication Sig Start Date End Date Taking? Authorizing Provider  ALPRAZolam Duanne Moron) 1 MG tablet Take 0.5-1 mg by mouth 3 (three) times daily as needed for anxiety or sleep.    Yes Historical Provider, MD  amphetamine-dextroamphetamine (ADDERALL) 20 MG tablet Take 20 mg by mouth daily as needed (ADHD).  05/13/15 03/18/16 Yes Historical Provider, MD  cetirizine (ZYRTEC) 10 MG tablet Take 10 mg by mouth daily as needed for allergies.  06/24/14  Yes Historical Provider, MD  Cholecalciferol (VITAMIN D3) 5000 UNITS CAPS Take 5,000 Units by mouth 2 (two) times daily.    Yes Historical Provider, MD  gabapentin (NEURONTIN) 300 MG capsule Take 300-900 mg by mouth 3 (three) times daily. Take 300mg  in the am and afternoon, and then take 900mg  at night   Yes Historical Provider, MD  ibuprofen (ADVIL,MOTRIN) 200 MG  tablet Take 800 mg by mouth every 6 (six) hours as needed for moderate pain.   Yes Historical Provider, MD  oxycodone (ROXICODONE) 30 MG immediate release tablet Take 30 mg by mouth every 4 (four) hours as needed for pain (for pain). Reported on 06/03/2015   Yes Historical Provider, MD  sertraline (ZOLOFT) 100 MG tablet Take 100 mg by mouth at bedtime. 02/10/16 02/09/17 Yes Historical Provider, MD  torsemide (DEMADEX) 10 MG tablet Take 10 mg by mouth  daily. And may take an extra dose if needed for fluid/edema 01/13/15  Yes Historical Provider, MD  amLODipine (NORVASC) 10 MG tablet Take 1 tablet (10 mg total) by mouth daily. Patient not taking: Reported on 03/18/2016 12/26/15   Domenic Polite, MD  cephALEXin (KEFLEX) 500 MG capsule Take 1 capsule (500 mg total) by mouth 3 (three) times daily. For 2days Patient not taking: Reported on 03/18/2016 12/25/15   Domenic Polite, MD  polyethylene glycol [facility] / Floria Raveling) packet Take 17 g by mouth daily as needed for moderate constipation. Patient not taking: Reported on 03/18/2016 12/25/15   Domenic Polite, MD    Inpatient medications: .  stroke: mapping our early stages of recovery book   Does not apply Once  . [START ON 03/20/2016] aspirin EC  81 mg Oral Daily  . cefTRIAXone (ROCEPHIN)  IV  1 g Intravenous Q24H  . cyanocobalamin  1,000 mcg Intramuscular Daily  . gabapentin  300 mg Oral QHS  . heparin  5,000 Units Subcutaneous Q8H  . loratadine  10 mg Oral Daily    Review of Systems + chronic pain, multiple joint pains +back pain + arthritis pain + left elbow pain +problems finding words + "my memory slips some"  No dysuria, hematuria +chronic leg swelling .   Physical Exam:  Blood pressure (!) 144/73, pulse 94, temperature 98.4 F (36.9 C), temperature source Oral, resp. rate (!) 22, weight 79.7 kg (175 lb 11.3 oz), SpO2 97 %.  Gen: Pleasant elderly WF NAD Scars over both shoulders, R knee Pleasant, conversational, but some problems keeping  train of thought No JVD Lungs clear S1S2 No S3 Abd soft and not tender Some pitting edema both legs, chronic changes, venous varicosities Mild myoclonus - intermittent vs asterixus Left arm in a sling  Labs:  Recent Labs Lab 03/17/16 2205 03/18/16 0600 03/18/16 0731 03/19/16 0305  NA 135 136  --  138  K 5.1 4.3  --  4.2  CL 100* 104  --  109  CO2 17* 16*  --  19*  GLUCOSE 170* 99  --  90  BUN 82* 79*  --  74*  CREATININE 6.42* 6.05* 6.06* 5.03*  CALCIUM 9.1 7.9*  --  7.8*     Recent Labs Lab 03/17/16 2205 03/18/16 0600  AST 25 18  ALT 14 13*  ALKPHOS 80 63  BILITOT 0.5 0.9  PROT 7.1 5.4*  ALBUMIN 4.2 3.1*    Recent Labs Lab 03/18/16 0731  AMMONIA 26    Recent Labs Lab 03/17/16 2205 03/18/16 0600 03/18/16 0731 03/19/16 0305  WBC 9.2 6.9 7.4 6.7  HGB 10.6* 8.5* 9.2* 8.8*  HCT 33.3* 27.1* 29.0* 28.2*  MCV 87.4 86.3 87.1 87.3  PLT 870* 789* 793* 763*     Recent Labs Lab 03/18/16 0600  CKTOTAL 258*   Iron/TIBC/Ferritin/ %Sat    Component Value Date/Time   IRON 20 (L) 03/18/2016 0731   TIBC 315 03/18/2016 0731   FERRITIN 73 03/18/2016 0731   IRONPCTSAT 6 (L) 03/18/2016 0731   Xrays/Other Studies: Dg Chest 1 View  Result Date: 03/18/2016 CLINICAL DATA:  Altered mental status EXAM: CHEST 1 VIEW COMPARISON:  05/09/2014 CXR FINDINGS: Heart is enlarged. The thoracic aorta is tortuous and partially calcified. Mild interstitial prominence is noted, chronic in appearance without pneumonic consolidation, effusion or pneumothorax. Surgical clips project over both humeral heads. Single screw projects over the right scapula and axilla. Spurring is noted off both humeral heads. IMPRESSION: Stable cardiomegaly. No acute  pulmonary disease. Aortic atherosclerosis. Electronically Signed   By: Ashley Royalty M.D.   On: 03/18/2016 01:36   Dg Pelvis 1-2 Views  Result Date: 03/18/2016 CLINICAL DATA:  Initial evaluation for acute trauma, fall. EXAM: PELVIS - 1-2 VIEW  COMPARISON:  Prior MRI from 01/06/2016. FINDINGS: Postsurgical changes present within the lower lumbar spine and about the right aspect of the sacrum. Bony pelvis intact. SI joints approximated. No pubic diastasis. No acute osseous abnormality about the right hip. Severe degenerative osteoarthritic changes present about the left hip with flattening and sclerosis of the left femoral head, similar to previous, and likely related to degenerative osteoarthritic changes. No acute fracture identified. Visualized soft tissues within normal limits. IMPRESSION: 1. 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 Changes are grossly similar as compared to previous MRI from 01/06/2016. No definite superimposed acute fracture or dislocation. If there is high clinical suspicion for possible occult hip fracture, further evaluation with dedicated MRI could be performed as indicated. 2. No other acute osseous abnormality about the pelvis. Electronically Signed   By: Jeannine Boga M.D.   On: 03/18/2016 06:46   Dg Elbow 2 Views Left  Result Date: 03/19/2016 CLINICAL DATA:  History of olecranon fracture status post casting EXAM: LEFT ELBOW - 2 VIEW COMPARISON:  03/17/2016 FINDINGS: Olecranon fracture is again identified with some distraction of the fracture fragments. No other focal abnormality is seen. Casting material is noted posteriorly. IMPRESSION: Stable olecranon fracture Electronically Signed   By: Inez Catalina M.D.   On: 03/19/2016 08:42   Dg Forearm Left  Result Date: 03/17/2016 CLINICAL DATA:  76 year old female with fall and pain in the left forearm. EXAM: LEFT FOREARM - 2 VIEW COMPARISON:  Left wrist radiograph dated 03/24/2015 FINDINGS: There is a displaced fracture of the proximal ulna involving the olecranon process with approximately 2 cm distraction. There is a a nondisplaced linear fracture in the proximal ulna extending into the articular surface. No other acute fracture identified. The  bones are osteopenic. Distal radial fixation plate and screws noted from prior fracture. There is no dislocation. The radiocapitellar alignment is preserved. There are degenerative changes of the carpal bones. There is soft tissue swelling of the elbow and proximal forearm. No radiopaque foreign object or soft tissue gas. IMPRESSION: Displaced fracture of the olecranon process of the ulna with inter articular extension of the fracture. No dislocation. Additional fractures may be present but not evident due to osteopenia. Dedicated radiograph of the elbow or CT is recommended for further evaluation. Electronically Signed   By: Anner Crete M.D.   On: 03/17/2016 22:26   Ct Head Wo Contrast  Result Date: 03/18/2016 CLINICAL DATA:  Status post fall multiple times, with confusion. Concern for head or cervical spine injury. Initial encounter. EXAM: CT HEAD WITHOUT CONTRAST CT CERVICAL SPINE WITHOUT CONTRAST TECHNIQUE: Multidetector CT imaging of the head and cervical spine was performed following the standard protocol without intravenous contrast. Multiplanar CT image reconstructions of the cervical spine were also generated. 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 No evidence of hemorrhage, hydrocephalus, extra-axial collection or mass lesion/mass effect. Prominence of the ventricles and sulci reflects mild cortical volume loss. Scattered periventricular and subcortical white matter change likely reflects small vessel ischemic microangiopathy. The brainstem and fourth ventricle are within normal limits.  The basal ganglia are unremarkable in appearance. The cerebral hemispheres demonstrate grossly normal gray-white differentiation. No mass effect or midline shift is seen. Vascular: No hyperdense vessel or  unexpected calcification. Skull: There is no evidence of fracture; visualized osseous structures are unremarkable in appearance. Sinuses/Orbits: The orbits are within normal limits. There is mild partial opacification of the sphenoid sinus. The remaining paranasal sinuses and mastoid air cells are well-aerated. Other: No significant soft tissue abnormalities are seen. CT CERVICAL SPINE FINDINGS Alignment: There is grade 1 anterolisthesis of C3 on C4. Underlying facet disease is noted. Skull base and vertebrae: No acute fracture. No primary bone lesion or focal pathologic process. Soft tissues and spinal canal: No prevertebral fluid or swelling. No visible canal hematoma. Disc levels: Multilevel disc space narrowing is noted along the lower cervical spine, with enlarged anterior disc osteophyte complexes. Mild underlying facet disease is noted. Upper chest: Hypodensities within the thyroid gland measure up to 1.6 cm on the left. The visualized lung apices are grossly clear. Scattered calcification is seen at the left lung apex. Other: No additional soft tissue abnormalities are seen. IMPRESSION: 1. No evidence of traumatic intracranial injury or fracture. 2. No evidence of acute fracture or subluxation along the cervical spine. 3. 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 Would correlate for any associated symptoms. 4. Mild cortical volume loss and scattered small vessel ischemic microangiopathy. 5. Mild degenerative change along the cervical spine, with grade 1 anterolisthesis of C3 on C4. 6. Mild partial opacification of the sphenoid sinus. 7. **An incidental finding of potential clinical significance has been found. Hypodensities within the thyroid gland measure up to 1.6 cm on the left. Consider further evaluation with thyroid ultrasound. If patient is clinically hyperthyroid, consider nuclear medicine thyroid uptake and scan.** Electronically Signed   By:  Garald Balding M.D.   On: 03/18/2016 01:35   Ct Cervical Spine Wo Contrast  Result Date: 03/18/2016 CLINICAL DATA:  Status post fall multiple times, with confusion. Concern for head or cervical spine injury. Initial encounter. EXAM: CT HEAD WITHOUT CONTRAST CT CERVICAL SPINE WITHOUT CONTRAST TECHNIQUE: Multidetector CT imaging of the head and cervical spine was performed following the standard protocol without intravenous contrast. Multiplanar CT image reconstructions of the cervical spine were also generated. 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 No evidence of hemorrhage, hydrocephalus, extra-axial collection or mass lesion/mass effect. Prominence of the ventricles and sulci reflects mild cortical volume loss. Scattered periventricular and subcortical white matter change likely reflects small vessel ischemic microangiopathy. The brainstem and fourth ventricle are within normal limits. The basal ganglia are unremarkable in appearance. The cerebral hemispheres demonstrate grossly normal gray-white differentiation. No mass effect or midline shift is seen. Vascular: No hyperdense vessel or unexpected calcification. Skull: There is no evidence of fracture; visualized osseous structures are unremarkable in appearance. Sinuses/Orbits: The orbits are within normal limits. There is mild partial opacification of the sphenoid sinus. The remaining paranasal sinuses and mastoid air cells are well-aerated. Other: No significant soft tissue abnormalities are seen. CT CERVICAL SPINE FINDINGS Alignment: There is grade 1 anterolisthesis of C3 on C4. Underlying facet disease is noted. Skull base and vertebrae: No acute fracture. No primary bone lesion or focal pathologic process. Soft tissues and spinal canal: No  prevertebral fluid or swelling. No visible canal hematoma. Disc levels: Multilevel disc space narrowing is noted along the lower cervical spine, with enlarged anterior disc osteophyte complexes. Mild underlying facet disease is noted. Upper chest: Hypodensities within the thyroid gland measure up to 1.6 cm on the left. The visualized lung apices are grossly clear. Scattered calcification is seen at the left lung apex. Other: No additional soft tissue abnormalities are seen. IMPRESSION: 1. No evidence of traumatic intracranial injury or fracture. 2. No evidence of acute fracture or subluxation along the cervical spine. 3. 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 Would correlate for any associated symptoms. 4. Mild cortical volume loss and scattered small vessel ischemic microangiopathy. 5. Mild degenerative change along the cervical spine, with grade 1 anterolisthesis of C3 on C4. 6. Mild partial opacification of the sphenoid sinus. 7. **An incidental finding of potential clinical significance has been found. Hypodensities within the thyroid gland measure up to 1.6 cm on the left. Consider further evaluation with thyroid ultrasound. If patient is clinically hyperthyroid, consider nuclear medicine thyroid uptake and scan.** Electronically Signed   By: Garald Balding M.D.   On: 03/18/2016 01:35   Mr Brain Wo Contrast  Result Date: 03/18/2016 CLINICAL DATA:  Fall and confusion. EXAM: MRI HEAD WITHOUT CONTRAST TECHNIQUE: Multiplanar, multiecho pulse sequences of the brain and surrounding structures were obtained without intravenous contrast. COMPARISON:  10/07/2003 FINDINGS: Brain: No acute infarction, hemorrhage, hydrocephalus, extra-axial collection or mass lesion. 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 Confluent gliosis in the cerebral white matter attributed to chronic microvascular disease given comorbidities and patient age.  These changes have significantly progressed since 2005. Milder microvascular ischemic change in the pons. Normal brain volume. Vascular: Preserved flow voids Skull and upper cervical spine: No acute finding Sinuses/Orbits: Bilateral cataract resection. Chronic fluid and/or mucosal thickening in the right mastoid air cells. Other: Intermittently motion degraded exam which decreases diagnostic sensitivity. IMPRESSION: 1. No acute finding. 2. 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 3. 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 Electronically Signed   By: Monte Fantasia M.D.   On: 03/18/2016 09:23   US Renal  Result Date: 03/18/2016 CLINICAL DATA:  76 year old female with acute renal failure. EXAM: RENAL / URINARY TRACT ULTRASOUND COMPLETE COMPARISON:  None. FINDINGS: Right Kidney: Length: 11 cm. There is increased renal echotexture. No hydronephrosis or echogenic stone. Left Kidney: Length: 11 cm. There is increased renal echogenicity. There is a 1.0 x 0.8 x 1.3 cm cyst in the left kidney. Bladder: Appears normal for degree of bladder distention. IMPRESSION: Increased renal echotexture compatible with underlying chronic medical renal disease. No hydronephrosis or echogenic stone. Electronically Signed   By: Anner Crete M.D.   On: 03/18/2016 03:22    Background: 76 y.o. year-old with longstanding arthritis, h/o multiple orthopedic surgeries over the years, chronic edema, HTN, neuropathy (gabapentin), fibromyalgia, chronic pain. Brought to the ED by her daughter after multiple falls, with back pain, elbow pain and confusion. Was taking a number of medications including xanax, gabapentin, oxycodone, indomethacin (started about 2 weeks ago by Dr. Cristal Deer and taking BID) with med rec that also included ibuprofen (she tells me she wasn't taking the ibuprofen). Was found to have a creatinine of 6.42 (0.96 Nov 1), bicarb  of 16, Hb 8.5. XRays showed L  Olecranon fracture. Was admitted, given IVF's at 100cc/hour with creatinine today  down to 5.03 and making urine. However, because of LE edema IV's stopped (pt says has had edema for "years" and takes small dose of torsemide for this). UA neg for blood or protein, [place] 11 cm kidneys bilaterally, echodense, L simple cyst. We were asked to see. No hypotension documented. No ACE or ARB  Assessment  1. AKI - most likely related to heavy NSAID use (MAY have been taking both indocin and ibuprofen).  UA neg, US echogenic kidneys with simple cyst of no consequence, no obstruction, SPEP normal, urine immunofix neg. Improved after IVF, discontinuation of NSAIDS . Non-oliguric. No dialysis indications. 2. Metabolic acidosis - 2/2 #1 improving 3. Anemia  - with associated Fe deficiency/tsat of 6% 4. Edema - chronic according to pt. Normal LVEF by ECHO. Takes low dose torsemide chronically 5. L olecranon fracture post fall - awaiting repair Monday 6. Chronic pain syndrome - on MULTIPLE pain meds (narcotics + benzos + gabapentin) 7. ? UTI - urine culture negative/to finish course of Rocephin 8. Confusion - clearing. Still w/mild myoclonus. 2/2 renal failure + narcs + gabapentin (900 mg/day PTA)  Recommendations: 1. OK that IVF's stopped (though no CHF and edema sounds chronic) - should be able to hydrate herself at this point 2. Renally adjust meds (as you have done) 3. Discourage use of NSAIDS (holding all at this time) 4. Continue to hold pt's torsemide for now 5. Dose of feraheme for extremely low transferrin saturation and serum Fe (and hemoccult stools) 6. Trend labs, strict I/O   Jamal Maes,  MD [facility] [phone] pager 03/19/2016, 7:06 PM

## 2016-03-19 NOTE — Progress Notes (Signed)
PROGRESS NOTE                                                                                                                                                                                                             Patient Demographics:    Shirley [name], is a 76 y.o. female, DOB - 1940-06-09, ZOX:[id number]  Admit date - 03/17/2016   Admitting Physician Rise Patience, MD  Outpatient Primary MD for the patient is HEDGECOCK,SUZANNE, PA-C  LOS - 1  Chief Complaint  Patient presents with  . Altered Mental Status       Brief Narrative   76 y.o. female with history of chronic pain and arthritis was recently placed on indomethacin last 2 weeks was brought to the ER after patient was found to be confused and had a fall, Workup significant for acute renal failure with a creatinine of 6.5, with anion gap of 18, possible UTI, CT with possible subacute occipital infarct, but no evidence of acute infarct on MRI brain,she has left elbow olecranon fracture, seen by orthopedic with the plan for surgery this coming Monday.   Subjective:    [name] today has, No headache, No chest pain, No abdominal pain - No Nausea, Report is feeling better, still complains of generalized weakness, reports left elbow pain is controlled .    Assessment  & Plan :    Principal Problem:   Acute renal failure (HCC) Active Problems:   Chronic pain syndrome   Acute encephalopathy   Elbow fracture, left, closed, initial encounter   Stroke (cerebrum) (HCC)   Stroke (HCC)  Acute renal failure  - Normal baseline creatinine, 6.5 on admission . - Most likely related to heavy NSAIDs use, as well. Renal cysts azotemia on presentation as she was on Demadex as well. - Improving with IV fluids, but with significant lower extremity edema today, so will hold IV fluids. - Total CK mildly elevated at 258, follow on SPEP and UPEP - Continue to hold Demadex and inserts  Acute  encephalopathy  - most likely multifactorial including metabolic encephalopathy secondary to electrolyte changes, acute renal failure, and pain medication. - Significantly improved, continue with pain meds and gabapentin at a lower dose. - CT showing possible subacute infarct, MRI brain with no acute findings .  UTI - no pyuria but significant bacteriuria ,  urine cultures and helpful as growing multiple species, to finish Rocephin for 3 days total .  Chronic pain syndrome and arthritis  - decreased patient's oxycodone from 30 mg every 6 hourly to 10 mg every 6 when necessary and decreased gabapentin from 300 mg 3 times a day to 300 mg at bedtime due to renal failure.   Left olecranon fracture  - orthopedic consult appreciated, plan for surgical repair on Monday  Chronic anemia  - B 12 level is borderline, will start on supplements.     Code Status : Full  Family Communication  : None at bedside  Disposition Plan  : Pending PT/CT evaluation, recommendation for CIR  Consults  :  Ortho Dr Marcelino Scot, renal  Procedures  : None  DVT Prophylaxis  :   Heparin - SCDs   Lab Results  Component Value Date   PLT 763 (H) 03/19/2016    Antibiotics  :    Anti-infectives    Start     Dose/Rate Route Frequency Ordered Stop   03/19/16 0600  cefTRIAXone (ROCEPHIN) 1 g in dextrose 5 % 50 mL IVPB     1 g 100 mL/hr over 30 Minutes Intravenous Every 24 hours 03/18/16 0547     03/18/16 0330  cefTRIAXone (ROCEPHIN) 1 g in dextrose 5 % 50 mL IVPB     1 g 100 mL/hr over 30 Minutes Intravenous  Once 03/18/16 0329 03/18/16 0425        Objective:   Vitals:   03/19/16 0440 03/19/16 0500 03/19/16 0804 03/19/16 1156  BP: 126/79   (!) 167/81  Pulse: 77  69 73  Resp: 17  16 17   Temp: 98.2 F (36.8 C)  98.5 F (36.9 C) 98.4 F (36.9 C)  TempSrc: Oral  Axillary Oral  SpO2: 99%  100% 98%  Weight:  79.7 kg (175 lb 11.3 oz)      Wt Readings from Last 3 Encounters:  03/19/16 79.7 kg (175 lb 11.3  oz)  01/06/16 81.6 kg (180 lb)  12/22/15 78.9 kg (174 lb)     Intake/Output Summary (Last 24 hours) at 03/19/16 1202 Last data filed at 03/19/16 1100  Gross per 24 hour  Intake             2501 ml  Output             1175 ml  Net             1326 ml     Physical Exam  Awake Alert, Oriented X 3,  Supple Neck,No JVD,  Symmetrical Chest wall movement, Good air movement bilaterally, CTAB RRR,No Gallops,Rubs or new Murmurs, No Parasternal Heave +ve B.Sounds, Abd Soft, No tenderness,, No rebound - guarding or rigidity. No Cyanosis, Clubbing , +2 edema, left arm in a cast/sling    Data Review:    CBC  Recent Labs Lab 03/17/16 2205 03/18/16 0600 03/18/16 0731 03/19/16 0305  WBC 9.2 6.9 7.4 6.7  HGB 10.6* 8.5* 9.2* 8.8*  HCT 33.3* 27.1* 29.0* 28.2*  PLT 870* 789* 793* 763*  MCV 87.4 86.3 87.1 87.3  MCH 27.8 27.1 27.6 27.2  MCHC 31.8 31.4 31.7 31.2  RDW 14.9 14.6 15.0 14.9    Chemistries   Recent Labs Lab 03/17/16 2205 03/18/16 0600 03/18/16 0731 03/19/16 0305  NA 135 136  --  138  K 5.1 4.3  --  4.2  CL 100* 104  --  109  CO2 17* 16*  --  19*  GLUCOSE 170* 99  --  90  BUN 82* 79*  --  74*  CREATININE 6.42* 6.05* 6.06* 5.03*  CALCIUM 9.1 7.9*  --  7.8*  AST 25 18  --   --   ALT 14 13*  --   --   ALKPHOS 80 63  --   --   BILITOT 0.5 0.9  --   --    ------------------------------------------------------------------------------------------------------------------  Recent Labs  03/18/16 0731  CHOL 152  HDL 55  LDLCALC 84  TRIG 64  CHOLHDL 2.8    Lab Results  Component Value Date   HGBA1C 6.0 (H) 03/18/2016   ------------------------------------------------------------------------------------------------------------------  Recent Labs  03/18/16 0600  TSH 1.854   ------------------------------------------------------------------------------------------------------------------  Recent Labs  03/18/16 0731  VITAMINB12 266  FOLATE 10.1    FERRITIN 73  TIBC 315  IRON 20*  RETICCTPCT 1.2    Coagulation profile No results for input(s): INR, PROTIME in the last 168 hours.  No results for input(s): DDIMER in the last 72 hours.  Cardiac Enzymes No results for input(s): CKMB, TROPONINI, MYOGLOBIN in the last 168 hours.  Invalid input(s): CK ------------------------------------------------------------------------------------------------------------------ No results found for: BNP  Inpatient Medications  Scheduled Meds: .  stroke: mapping our early stages of recovery book   Does not apply Once  . aspirin  300 mg Rectal Daily   Or  . aspirin  325 mg Oral Daily  . cefTRIAXone (ROCEPHIN)  IV  1 g Intravenous Q24H  . gabapentin  300 mg Oral QHS  . heparin  5,000 Units Subcutaneous Q8H  . loratadine  10 mg Oral Daily   Continuous Infusions: PRN Meds:.acetaminophen **OR** acetaminophen (TYLENOL) oral liquid 160 mg/5 mL **OR** acetaminophen, ALPRAZolam, amphetamine-dextroamphetamine, ondansetron **OR** ondansetron (ZOFRAN) IV, oxyCODONE  Micro Results Recent Results (from the past 240 hour(s))  Urine culture     Status: Abnormal   Collection Time: 03/18/16 12:52 AM  Result Value Ref Range Status   Specimen Description URINE, RANDOM  Final   Special Requests NONE  Final   Culture MULTIPLE SPECIES PRESENT, SUGGEST RECOLLECTION (A)  Final   Report Status 03/19/2016 FINAL  Final    Radiology Reports Dg Chest 1 View  Result Date: 03/18/2016 CLINICAL DATA:  Altered mental status EXAM: CHEST 1 VIEW COMPARISON:  05/09/2014 CXR FINDINGS: Heart is enlarged. The thoracic aorta is tortuous and partially calcified. Mild interstitial prominence is noted, chronic in appearance without pneumonic consolidation, effusion or pneumothorax. Surgical clips project over both humeral heads. Single screw projects over the right scapula and axilla. Spurring is noted off both humeral heads. IMPRESSION: Stable cardiomegaly. No acute pulmonary  disease. Aortic atherosclerosis. Electronically Signed   By: Ashley Royalty M.D.   On: 03/18/2016 01:36   Dg Pelvis 1-2 Views  Result Date: 03/18/2016 CLINICAL DATA:  Initial evaluation for acute trauma, fall. EXAM: PELVIS - 1-2 VIEW COMPARISON:  Prior MRI from 01/06/2016. FINDINGS: Postsurgical changes present within the lower lumbar spine and about the right aspect of the sacrum. Bony pelvis intact. SI joints approximated. No pubic diastasis. No acute osseous abnormality about the right hip. Severe degenerative osteoarthritic changes present about the left hip with flattening and sclerosis of the left femoral head, similar to previous, and likely related to degenerative osteoarthritic changes. No acute fracture identified. Visualized soft tissues within normal limits. IMPRESSION: 1. 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 Changes are grossly similar as compared to previous MRI from 01/06/2016. No definite superimposed acute fracture or dislocation. If there is  high clinical suspicion for possible occult hip fracture, further evaluation with dedicated MRI could be performed as indicated. 2. No other acute osseous abnormality about the pelvis. Electronically Signed   By: Jeannine Boga M.D.   On: 03/18/2016 06:46   Dg Elbow 2 Views Left  Result Date: 03/19/2016 CLINICAL DATA:  History of olecranon fracture status post casting EXAM: LEFT ELBOW - 2 VIEW COMPARISON:  03/17/2016 FINDINGS: Olecranon fracture is again identified with some distraction of the fracture fragments. No other focal abnormality is seen. Casting material is noted posteriorly. IMPRESSION: Stable olecranon fracture Electronically Signed   By: Inez Catalina M.D.   On: 03/19/2016 08:42   Dg Forearm Left  Result Date: 03/17/2016 CLINICAL DATA:  76 year old female with fall and pain in the left forearm. EXAM: LEFT FOREARM - 2 VIEW COMPARISON:  Left wrist radiograph dated 03/24/2015 FINDINGS: There is a displaced  fracture of the proximal ulna involving the olecranon process with approximately 2 cm distraction. There is a a nondisplaced linear fracture in the proximal ulna extending into the articular surface. No other acute fracture identified. The bones are osteopenic. Distal radial fixation plate and screws noted from prior fracture. There is no dislocation. The radiocapitellar alignment is preserved. There are degenerative changes of the carpal bones. There is soft tissue swelling of the elbow and proximal forearm. No radiopaque foreign object or soft tissue gas. IMPRESSION: Displaced fracture of the olecranon process of the ulna with inter articular extension of the fracture. No dislocation. Additional fractures may be present but not evident due to osteopenia. Dedicated radiograph of the elbow or CT is recommended for further evaluation. Electronically Signed   By: Anner Crete M.D.   On: 03/17/2016 22:26   Ct Head Wo Contrast  Result Date: 03/18/2016 CLINICAL DATA:  Status post fall multiple times, with confusion. Concern for head or cervical spine injury. Initial encounter. EXAM: CT HEAD WITHOUT CONTRAST CT CERVICAL SPINE WITHOUT CONTRAST TECHNIQUE: Multidetector CT imaging of the head and cervical spine was performed following the standard protocol without intravenous contrast. Multiplanar CT image reconstructions of the cervical spine were also generated. 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 No evidence of hemorrhage, hydrocephalus, extra-axial collection or mass lesion/mass effect. Prominence of the ventricles and sulci reflects mild cortical volume loss. Scattered periventricular and subcortical white matter change likely reflects small vessel ischemic microangiopathy. The brainstem and fourth  ventricle are within normal limits. The basal ganglia are unremarkable in appearance. The cerebral hemispheres demonstrate grossly normal gray-white differentiation. No mass effect or midline shift is seen. Vascular: No hyperdense vessel or unexpected calcification. Skull: There is no evidence of fracture; visualized osseous structures are unremarkable in appearance. Sinuses/Orbits: The orbits are within normal limits. There is mild partial opacification of the sphenoid sinus. The remaining paranasal sinuses and mastoid air cells are well-aerated. Other: No significant soft tissue abnormalities are seen. CT CERVICAL SPINE FINDINGS Alignment: There is grade 1 anterolisthesis of C3 on C4. Underlying facet disease is noted. Skull base and vertebrae: No acute fracture. No primary bone lesion or focal pathologic process. Soft tissues and spinal canal: No prevertebral fluid or swelling. No visible canal hematoma. Disc levels: Multilevel disc space narrowing is noted along the lower cervical spine, with enlarged anterior disc osteophyte complexes. Mild underlying facet disease is noted.  Upper chest: Hypodensities within the thyroid gland measure up to 1.6 cm on the left. The visualized lung apices are grossly clear. Scattered calcification is seen at the left lung apex. Other: No additional soft tissue abnormalities are seen. IMPRESSION: 1. No evidence of traumatic intracranial injury or fracture. 2. No evidence of acute fracture or subluxation along the cervical spine. 3. 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 Would correlate for any associated symptoms. 4. Mild cortical volume loss and scattered small vessel ischemic microangiopathy. 5. Mild degenerative change along the cervical spine, with grade 1 anterolisthesis of C3 on C4. 6. Mild partial opacification of the sphenoid sinus. 7. **An incidental finding of potential clinical significance has been found.  Hypodensities within the thyroid gland measure up to 1.6 cm on the left. Consider further evaluation with thyroid ultrasound. If patient is clinically hyperthyroid, consider nuclear medicine thyroid uptake and scan.** Electronically Signed   By: Garald Balding M.D.   On: 03/18/2016 01:35   Ct Cervical Spine Wo Contrast  Result Date: 03/18/2016 CLINICAL DATA:  Status post fall multiple times, with confusion. Concern for head or cervical spine injury. Initial encounter. EXAM: CT HEAD WITHOUT CONTRAST CT CERVICAL SPINE WITHOUT CONTRAST TECHNIQUE: Multidetector CT imaging of the head and cervical spine was performed following the standard protocol without intravenous contrast. Multiplanar CT image reconstructions of the cervical spine were also generated. 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 No evidence of hemorrhage, hydrocephalus, extra-axial collection or mass lesion/mass effect. Prominence of the ventricles and sulci reflects mild cortical volume loss. Scattered periventricular and subcortical white matter change likely reflects small vessel ischemic microangiopathy. The brainstem and fourth ventricle are within normal limits. The basal ganglia are unremarkable in appearance. The cerebral hemispheres demonstrate grossly normal gray-white differentiation. No mass effect or midline shift is seen. Vascular: No hyperdense vessel or unexpected calcification. Skull: There is no evidence of fracture; visualized osseous structures are unremarkable in appearance. Sinuses/Orbits: The orbits are within normal limits. There is mild partial opacification of the sphenoid sinus. The remaining paranasal sinuses and mastoid air cells are well-aerated. Other: No significant soft tissue abnormalities are seen. CT CERVICAL  SPINE FINDINGS Alignment: There is grade 1 anterolisthesis of C3 on C4. Underlying facet disease is noted. Skull base and vertebrae: No acute fracture. No primary bone lesion or focal pathologic process. Soft tissues and spinal canal: No prevertebral fluid or swelling. No visible canal hematoma. Disc levels: Multilevel disc space narrowing is noted along the lower cervical spine, with enlarged anterior disc osteophyte complexes. Mild underlying facet disease is noted. Upper chest: Hypodensities within the thyroid gland measure up to 1.6 cm on the left. The visualized lung apices are grossly clear. Scattered calcification is seen at the left lung apex. Other: No additional soft tissue abnormalities are seen. IMPRESSION: 1. No evidence of traumatic intracranial injury or fracture. 2. No evidence of acute fracture or subluxation along the cervical spine. 3. 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 Would correlate for any associated symptoms. 4. Mild cortical volume loss and scattered small vessel ischemic microangiopathy. 5. Mild degenerative change along the cervical spine, with grade 1 anterolisthesis of C3 on C4. 6. Mild partial opacification of the sphenoid sinus. 7. **  An incidental finding of potential clinical significance has been found. Hypodensities within the thyroid gland measure up to 1.6 cm on the left. Consider further evaluation with thyroid ultrasound. If patient is clinically hyperthyroid, consider nuclear medicine thyroid uptake and scan.** Electronically Signed   By: Garald Balding M.D.   On: 03/18/2016 01:35   Mr Brain Wo Contrast  Result Date: 03/18/2016 CLINICAL DATA:  Fall and confusion. EXAM: MRI HEAD WITHOUT CONTRAST TECHNIQUE: Multiplanar, multiecho pulse sequences of the brain and surrounding structures were obtained without intravenous contrast. COMPARISON:  10/07/2003 FINDINGS: Brain: No acute infarction, hemorrhage,  hydrocephalus, extra-axial collection or mass lesion. 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 Confluent gliosis in the cerebral white matter attributed to chronic microvascular disease given comorbidities and patient age. These changes have significantly progressed since 2005. Milder microvascular ischemic change in the pons. Normal brain volume. Vascular: Preserved flow voids Skull and upper cervical spine: No acute finding Sinuses/Orbits: Bilateral cataract resection. Chronic fluid and/or mucosal thickening in the right mastoid air cells. Other: Intermittently motion degraded exam which decreases diagnostic sensitivity. IMPRESSION: 1. No acute finding. 2. 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 3. 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 Electronically Signed   By: Monte Fantasia M.D.   On: 03/18/2016 09:23   US Renal  Result Date: 03/18/2016 CLINICAL DATA:  76 year old female with acute renal failure. EXAM: RENAL / URINARY TRACT ULTRASOUND COMPLETE COMPARISON:  None. FINDINGS: Right Kidney: Length: 11 cm. There is increased renal echotexture. No hydronephrosis or echogenic stone. Left Kidney: Length: 11 cm. There is increased renal echogenicity. There is a 1.0 x 0.8 x 1.3 cm cyst in the left kidney. Bladder: Appears normal for degree of bladder distention. IMPRESSION: Increased renal echotexture compatible with underlying chronic medical renal disease. No hydronephrosis or echogenic stone. Electronically Signed   By: Anner Crete M.D.   On: 03/18/2016 03:22     [facility], Dannia Snook M.D on 03/19/2016 at 12:02 PM  Between 7am to 7pm - Pager - [phone]  After 7pm go to [email or web address] - password [email or web address]  [facility] -  Office  [phone]

## 2016-03-19 NOTE — Progress Notes (Signed)
Rehab Admissions Coordinator Note:  Patient was screened by Retta Diones for appropriateness for an Inpatient Acute Rehab Consult.  At this time, we are recommending Inpatient Rehab consult after patient has surgery on 03/22/16.  Patient has [employer] and we would need authorization for inpatient rehab admission.  I will follow progress for now  Retta Diones 03/19/2016, 12:18 PM  I can be reached at [phone].

## 2016-03-20 LAB — CBC
HCT: 29.9 % — ABNORMAL LOW (ref 36.0–46.0)
HEMOGLOBIN: 9.3 g/dL — AB (ref 12.0–15.0)
MCH: 27.2 pg (ref 26.0–34.0)
MCHC: 31.1 g/dL (ref 30.0–36.0)
MCV: 87.4 fL (ref 78.0–100.0)
Platelets: 866 10*3/uL — ABNORMAL HIGH (ref 150–400)
RBC: 3.42 MIL/uL — ABNORMAL LOW (ref 3.87–5.11)
RDW: 14.8 % (ref 11.5–15.5)
WBC: 8.1 10*3/uL (ref 4.0–10.5)

## 2016-03-20 LAB — BASIC METABOLIC PANEL
ANION GAP: 9 (ref 5–15)
BUN: 64 mg/dL — AB (ref 6–20)
CHLORIDE: 110 mmol/L (ref 101–111)
CO2: 19 mmol/L — ABNORMAL LOW (ref 22–32)
Calcium: 8 mg/dL — ABNORMAL LOW (ref 8.9–10.3)
Creatinine, Ser: 4.04 mg/dL — ABNORMAL HIGH (ref 0.44–1.00)
GFR calc Af Amer: 11 mL/min — ABNORMAL LOW (ref >60)
GFR, EST NON AFRICAN AMERICAN: 10 mL/min — AB (ref >60)
GLUCOSE: 107 mg/dL — AB (ref 65–99)
POTASSIUM: 4.1 mmol/L (ref 3.5–5.1)
SODIUM: 138 mmol/L (ref 135–145)

## 2016-03-20 LAB — OCCULT BLOOD X 1 CARD TO LAB, STOOL
FECAL OCCULT BLD: NEGATIVE
FECAL OCCULT BLD: NEGATIVE
FECAL OCCULT BLD: NEGATIVE

## 2016-03-20 LAB — MRSA PCR SCREENING: MRSA by PCR: NEGATIVE

## 2016-03-20 MED ORDER — HYDRALAZINE HCL 20 MG/ML IJ SOLN
10.0000 mg | INTRAMUSCULAR | Status: DC | PRN
  Administered 2016-03-21 – 2016-03-23 (×5): 10 mg via INTRAVENOUS
  Filled 2016-03-20 (×7): qty 1

## 2016-03-20 NOTE — Progress Notes (Signed)
PROGRESS NOTE                                                                                                                                                                                                             Patient Demographics:    Shirley [name], is a 76 y.o. female, DOB - 1940/07/06, SNK:[id number]  Admit date - 03/17/2016   Admitting Physician Rise Patience, MD  Outpatient Primary MD for the patient is HEDGECOCK,SUZANNE, PA-C  LOS - 2  Chief Complaint  Patient presents with  . Altered Mental Status       Brief Narrative   76 y.o. female with history of chronic pain and arthritis was recently placed on indomethacin last 2 weeks was brought to the ER after patient was found to be confused and had a fall, Workup significant for acute renal failure with a creatinine of 6.5, with anion gap of 18, possible UTI, CT with possible subacute occipital infarct, but no evidence of acute infarct on MRI brain,she has left elbow olecranon fracture, seen by orthopedic with the plan for surgery this coming Monday.   Subjective:    Shirley [name] today has, No headache, No chest pain, No abdominal pain - No Nausea, Report is feeling better, reports left elbow pain is controlled .    Assessment  & Plan :    Principal Problem:   Acute renal failure (HCC) Active Problems:   Chronic pain syndrome   Acute encephalopathy   Elbow fracture, left, closed, initial encounter  Acute renal failure  - Normal baseline creatinine, 6.5 on admission . - Renal input greatly appreciated, achy I most likely related to heavy NSAIDs use, IV fluids stopped giving lower extremity edema, negative urinalysis, renal ultrasound with simple cysts, no obstruction, SPEP normal, urine immunofixation negative. - Continue to improve, but still significantly elevated, will need close monitoring, will continue to monitor BMP daily - Continue to hold Demadex and NSAIDs  Acute  encephalopathy  - most likely multifactorial including metabolic encephalopathy secondary to electrolyte changes, acute renal failure, and pain medication. - Significantly improved, continue with pain meds and gabapentin at a lower dose. - CT showing possible subacute infarct,But MRI brain with no acute findings .  UTI - no pyuria but significant bacteriuria , urine cultures aren't helpful as growing multiple species, treated with 3 days  of Rocephin .  Chronic pain syndrome and arthritis  - decreased patient's oxycodone from 30 mg every 6 hourly to 10 mg every 6 when necessary and decreased gabapentin from 300 mg 3 times a day to 300 mg at bedtime due to renal failure.   Left olecranon fracture  - orthopedic consult appreciated, plan for surgical repair on Monday  Chronic anemia  - Workup significant for low iron and B-12 level, continue with B-12 supplement, received IV iron by renal, Hemoccult pending, will need iron deficiency anemia as outpatient.   Code Status : Full  Family Communication  : None at bedside  Disposition Plan  : Pending PT/CT evaluation, recommendation for CIR  Consults  :  Ortho Dr Marcelino Scot, renal  Procedures  : None  DVT Prophylaxis  :   Heparin - SCDs   Lab Results  Component Value Date   PLT 866 (H) 03/20/2016    Antibiotics  :    Anti-infectives    Start     Dose/Rate Route Frequency Ordered Stop   03/19/16 0600  cefTRIAXone (ROCEPHIN) 1 g in dextrose 5 % 50 mL IVPB     1 g 100 mL/hr over 30 Minutes Intravenous Every 24 hours 03/18/16 0547     03/18/16 0330  cefTRIAXone (ROCEPHIN) 1 g in dextrose 5 % 50 mL IVPB     1 g 100 mL/hr over 30 Minutes Intravenous  Once 03/18/16 0329 03/18/16 0425        Objective:   Vitals:   03/19/16 1931 03/19/16 2318 03/20/16 0237 03/20/16 0720  BP: (!) 167/70 (!) 151/60 (!) 160/65 137/75  Pulse: 77 91 76 72  Resp: 18 (!) 23 13 19   Temp: 98.4 F (36.9 C) 98.6 F (37 C) 98.5 F (36.9 C) 98.7 F (37.1 C)    TempSrc: Oral Oral Oral Oral  SpO2: 100% 97% 95% 93%  Weight:   81.4 kg (179 lb 7.3 oz)     Wt Readings from Last 3 Encounters:  03/20/16 81.4 kg (179 lb 7.3 oz)  01/06/16 81.6 kg (180 lb)  12/22/15 78.9 kg (174 lb)     Intake/Output Summary (Last 24 hours) at 03/20/16 1138 Last data filed at 03/20/16 0843  Gross per 24 hour  Intake              720 ml  Output             1800 ml  Net            -1080 ml     Physical Exam  Awake Alert, Oriented X 3,  Supple Neck,No JVD,  Symmetrical Chest wall movement, Good air movement bilaterally, CTAB RRR,No Gallops,Rubs or new Murmurs, No Parasternal Heave +ve B.Sounds, Abd Soft, No tenderness,, No rebound - guarding or rigidity. No Cyanosis, Clubbing , +2 edema, left arm in a cast/sling    Data Review:    CBC  Recent Labs Lab 03/17/16 2205 03/18/16 0600 03/18/16 0731 03/19/16 0305 03/20/16 0210  WBC 9.2 6.9 7.4 6.7 8.1  HGB 10.6* 8.5* 9.2* 8.8* 9.3*  HCT 33.3* 27.1* 29.0* 28.2* 29.9*  PLT 870* 789* 793* 763* 866*  MCV 87.4 86.3 87.1 87.3 87.4  MCH 27.8 27.1 27.6 27.2 27.2  MCHC 31.8 31.4 31.7 31.2 31.1  RDW 14.9 14.6 15.0 14.9 14.8    Chemistries   Recent Labs Lab 03/17/16 2205 03/18/16 0600 03/18/16 0731 03/19/16 0305 03/20/16 0210  NA 135 136  --  138 138  K  5.1 4.3  --  4.2 4.1  CL 100* 104  --  109 110  CO2 17* 16*  --  19* 19*  GLUCOSE 170* 99  --  90 107*  BUN 82* 79*  --  74* 64*  CREATININE 6.42* 6.05* 6.06* 5.03* 4.04*  CALCIUM 9.1 7.9*  --  7.8* 8.0*  AST 25 18  --   --   --   ALT 14 13*  --   --   --   ALKPHOS 80 63  --   --   --   BILITOT 0.5 0.9  --   --   --    ------------------------------------------------------------------------------------------------------------------  Recent Labs  03/18/16 0731  CHOL 152  HDL 55  LDLCALC 84  TRIG 64  CHOLHDL 2.8    Lab Results  Component Value Date   HGBA1C 6.0 (H) 03/18/2016    ------------------------------------------------------------------------------------------------------------------  Recent Labs  03/18/16 0600  TSH 1.854   ------------------------------------------------------------------------------------------------------------------  Recent Labs  03/18/16 0731  VITAMINB12 266  FOLATE 10.1  FERRITIN 73  TIBC 315  IRON 20*  RETICCTPCT 1.2    Coagulation profile No results for input(s): INR, PROTIME in the last 168 hours.  No results for input(s): DDIMER in the last 72 hours.  Cardiac Enzymes No results for input(s): CKMB, TROPONINI, MYOGLOBIN in the last 168 hours.  Invalid input(s): CK ------------------------------------------------------------------------------------------------------------------ No results found for: BNP  Inpatient Medications  Scheduled Meds: .  stroke: mapping our early stages of recovery book   Does not apply Once  . aspirin EC  81 mg Oral Daily  . cefTRIAXone (ROCEPHIN)  IV  1 g Intravenous Q24H  . cyanocobalamin  1,000 mcg Intramuscular Daily  . docusate sodium  100 mg Oral Daily  . gabapentin  300 mg Oral QHS  . heparin  5,000 Units Subcutaneous Q8H  . loratadine  10 mg Oral Daily   Continuous Infusions: PRN Meds:.acetaminophen **OR** acetaminophen (TYLENOL) oral liquid 160 mg/5 mL **OR** acetaminophen, ALPRAZolam, amphetamine-dextroamphetamine, ondansetron **OR** ondansetron (ZOFRAN) IV, oxyCODONE  Micro Results Recent Results (from the past 240 hour(s))  Urine culture     Status: Abnormal   Collection Time: 03/18/16 12:52 AM  Result Value Ref Range Status   Specimen Description URINE, RANDOM  Final   Special Requests NONE  Final   Culture MULTIPLE SPECIES PRESENT, SUGGEST RECOLLECTION (A)  Final   Report Status 03/19/2016 FINAL  Final    Radiology Reports Dg Chest 1 View  Result Date: 03/18/2016 CLINICAL DATA:  Altered mental status EXAM: CHEST 1 VIEW COMPARISON:  05/09/2014 CXR FINDINGS:  Heart is enlarged. The thoracic aorta is tortuous and partially calcified. Mild interstitial prominence is noted, chronic in appearance without pneumonic consolidation, effusion or pneumothorax. Surgical clips project over both humeral heads. Single screw projects over the right scapula and axilla. Spurring is noted off both humeral heads. IMPRESSION: Stable cardiomegaly. No acute pulmonary disease. Aortic atherosclerosis. Electronically Signed   By: Ashley Royalty M.D.   On: 03/18/2016 01:36   Dg Pelvis 1-2 Views  Result Date: 03/18/2016 CLINICAL DATA:  Initial evaluation for acute trauma, fall. EXAM: PELVIS - 1-2 VIEW COMPARISON:  Prior MRI from 01/06/2016. FINDINGS: Postsurgical changes present within the lower lumbar spine and about the right aspect of the sacrum. Bony pelvis intact. SI joints approximated. No pubic diastasis. No acute osseous abnormality about the right hip. Severe degenerative osteoarthritic changes present about the left hip with flattening and sclerosis of the left femoral head, similar to previous,  and likely related to degenerative osteoarthritic changes. No acute fracture identified. Visualized soft tissues within normal limits. IMPRESSION: 1. 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 Changes are grossly similar as compared to previous MRI from 01/06/2016. No definite superimposed acute fracture or dislocation. If there is high clinical suspicion for possible occult hip fracture, further evaluation with dedicated MRI could be performed as indicated. 2. No other acute osseous abnormality about the pelvis. Electronically Signed   By: Jeannine Boga M.D.   On: 03/18/2016 06:46   Dg Elbow 2 Views Left  Result Date: 03/19/2016 CLINICAL DATA:  History of olecranon fracture status post casting EXAM: LEFT ELBOW - 2 VIEW COMPARISON:  03/17/2016 FINDINGS: Olecranon fracture is again identified with some distraction of the fracture fragments. No other focal  abnormality is seen. Casting material is noted posteriorly. IMPRESSION: Stable olecranon fracture Electronically Signed   By: Inez Catalina M.D.   On: 03/19/2016 08:42   Dg Forearm Left  Result Date: 03/17/2016 CLINICAL DATA:  76 year old female with fall and pain in the left forearm. EXAM: LEFT FOREARM - 2 VIEW COMPARISON:  Left wrist radiograph dated 03/24/2015 FINDINGS: There is a displaced fracture of the proximal ulna involving the olecranon process with approximately 2 cm distraction. There is a a nondisplaced linear fracture in the proximal ulna extending into the articular surface. No other acute fracture identified. The bones are osteopenic. Distal radial fixation plate and screws noted from prior fracture. There is no dislocation. The radiocapitellar alignment is preserved. There are degenerative changes of the carpal bones. There is soft tissue swelling of the elbow and proximal forearm. No radiopaque foreign object or soft tissue gas. IMPRESSION: Displaced fracture of the olecranon process of the ulna with inter articular extension of the fracture. No dislocation. Additional fractures may be present but not evident due to osteopenia. Dedicated radiograph of the elbow or CT is recommended for further evaluation. Electronically Signed   By: Anner Crete M.D.   On: 03/17/2016 22:26   Ct Head Wo Contrast  Result Date: 03/18/2016 CLINICAL DATA:  Status post fall multiple times, with confusion. Concern for head or cervical spine injury. Initial encounter. EXAM: CT HEAD WITHOUT CONTRAST CT CERVICAL SPINE WITHOUT CONTRAST TECHNIQUE: Multidetector CT imaging of the head and cervical spine was performed following the standard protocol without intravenous contrast. Multiplanar CT image reconstructions of the cervical spine were also generated. 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 No evidence of hemorrhage, hydrocephalus, extra-axial collection or mass lesion/mass effect. Prominence of the ventricles and sulci reflects mild cortical volume loss. Scattered periventricular and subcortical white matter change likely reflects small vessel ischemic microangiopathy. The brainstem and fourth ventricle are within normal limits. The basal ganglia are unremarkable in appearance. The cerebral hemispheres demonstrate grossly normal gray-white differentiation. No mass effect or midline shift is seen. Vascular: No hyperdense vessel or unexpected calcification. Skull: There is no evidence of fracture; visualized osseous structures are unremarkable in appearance. Sinuses/Orbits: The orbits are within normal limits. There is mild partial opacification of the sphenoid sinus. The remaining paranasal sinuses and mastoid air cells are well-aerated. Other: No significant soft tissue abnormalities are seen. CT CERVICAL SPINE FINDINGS Alignment: There is grade 1 anterolisthesis of C3 on C4. Underlying facet disease is noted. Skull base and vertebrae: No acute  fracture. No primary bone lesion or focal pathologic process. Soft tissues and spinal canal: No prevertebral fluid or swelling. No visible canal hematoma. Disc levels: Multilevel disc space narrowing is noted along the lower cervical spine, with enlarged anterior disc osteophyte complexes. Mild underlying facet disease is noted. Upper chest: Hypodensities within the thyroid gland measure up to 1.6 cm on the left. The visualized lung apices are grossly clear. Scattered calcification is seen at the left lung apex. Other: No additional soft tissue abnormalities are seen. IMPRESSION: 1. No evidence of traumatic intracranial injury or fracture. 2. No evidence of acute fracture or subluxation along the cervical spine. 3. 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 Would correlate for any associated symptoms. 4. Mild cortical volume loss and scattered small vessel ischemic microangiopathy. 5. Mild degenerative change along the cervical spine, with grade 1 anterolisthesis of C3 on C4. 6. Mild partial opacification of the sphenoid sinus. 7. **An incidental finding of potential clinical significance has been found. Hypodensities within the thyroid gland measure up to 1.6 cm on the left. Consider further evaluation with thyroid ultrasound. If patient is clinically hyperthyroid, consider nuclear medicine thyroid uptake and scan.** Electronically Signed   By: Garald Balding M.D.   On: 03/18/2016 01:35   Ct Cervical Spine Wo Contrast  Result Date: 03/18/2016 CLINICAL DATA:  Status post fall multiple times, with confusion. Concern for head or cervical spine injury. Initial encounter. EXAM: CT HEAD WITHOUT CONTRAST CT CERVICAL SPINE WITHOUT CONTRAST TECHNIQUE: Multidetector CT imaging of the head and cervical spine was performed following the standard protocol without intravenous contrast. Multiplanar CT image reconstructions of the cervical spine were also generated. 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 No evidence of hemorrhage, hydrocephalus, extra-axial collection or mass lesion/mass effect. Prominence of the ventricles and sulci reflects mild cortical volume loss. Scattered periventricular and subcortical white matter change likely reflects small vessel ischemic microangiopathy. The brainstem and fourth ventricle are within normal limits. The basal ganglia are unremarkable in appearance. The cerebral hemispheres demonstrate grossly normal gray-white differentiation. No mass effect or midline shift is seen. Vascular: No  hyperdense vessel or unexpected calcification. Skull: There is no evidence of fracture; visualized osseous structures are unremarkable in appearance. Sinuses/Orbits: The orbits are within normal limits. There is mild partial opacification of the sphenoid sinus. The remaining paranasal sinuses and mastoid air cells are well-aerated. Other: No significant soft tissue abnormalities are seen. CT CERVICAL SPINE FINDINGS Alignment: There is grade 1 anterolisthesis of C3 on C4. Underlying facet disease is noted. Skull base and vertebrae: No acute fracture. No primary bone lesion or focal pathologic process. Soft tissues and spinal canal: No prevertebral fluid or swelling. No visible canal hematoma. Disc levels: Multilevel disc space narrowing is noted along the lower cervical spine, with enlarged anterior disc osteophyte complexes. Mild underlying facet disease is noted. Upper chest: Hypodensities within the thyroid gland measure up to 1.6 cm on the left. The visualized lung apices are grossly clear. Scattered calcification is seen at the left lung apex. Other: No additional soft tissue abnormalities are seen. IMPRESSION: 1. No evidence of traumatic intracranial injury or fracture. 2. No evidence of acute fracture or subluxation along the cervical spine. 3. Apparent subacute or possibly chronic infarct at the inferior  left occipital lobe, new from 2005. Would correlate for any associated symptoms. 4. Mild cortical volume loss and scattered small vessel ischemic microangiopathy. 5. Mild degenerative change along the cervical spine, with grade 1 anterolisthesis of C3 on C4. 6. Mild partial opacification of the sphenoid sinus. 7. **An incidental finding of potential clinical significance has been found. Hypodensities within the thyroid gland measure up to 1.6 cm on the left. Consider further evaluation with thyroid ultrasound. If patient is clinically hyperthyroid, consider nuclear medicine thyroid uptake and scan.**  Electronically Signed   By: Garald Balding M.D.   On: 03/18/2016 01:35   Mr Brain Wo Contrast  Result Date: 03/18/2016 CLINICAL DATA:  Fall and confusion. EXAM: MRI HEAD WITHOUT CONTRAST TECHNIQUE: Multiplanar, multiecho pulse sequences of the brain and surrounding structures were obtained without intravenous contrast. COMPARISON:  10/07/2003 FINDINGS: Brain: No acute infarction, hemorrhage, hydrocephalus, extra-axial collection or mass lesion. 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 Confluent gliosis in the cerebral white matter attributed to chronic microvascular disease given comorbidities and patient age. These changes have significantly progressed since 2005. Milder microvascular ischemic change in the pons. Normal brain volume. Vascular: Preserved flow voids Skull and upper cervical spine: No acute finding Sinuses/Orbits: Bilateral cataract resection. Chronic fluid and/or mucosal thickening in the right mastoid air cells. Other: Intermittently motion degraded exam which decreases diagnostic sensitivity. IMPRESSION: 1. No acute finding. 2. 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 3. 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 Electronically Signed   By: Monte Fantasia M.D.   On: 03/18/2016 09:23   US Renal  Result Date: 03/18/2016 CLINICAL DATA:  76 year old female with acute renal failure. EXAM: RENAL / URINARY TRACT ULTRASOUND COMPLETE COMPARISON:  None. FINDINGS: Right Kidney: Length: 11 cm. There is increased renal echotexture. No hydronephrosis or echogenic stone. Left Kidney: Length: 11 cm. There is increased renal echogenicity. There is a 1.0 x 0.8 x 1.3 cm cyst in the left kidney. Bladder: Appears normal for degree of bladder distention. IMPRESSION: Increased renal echotexture compatible with underlying chronic medical renal disease. No hydronephrosis or echogenic stone.  Electronically Signed   By: Anner Crete M.D.   On: 03/18/2016 03:22     [facility], Alfonzo Arca M.D on 03/20/2016 at 11:38 AM  Between 7am to 7pm - Pager - [phone]  After 7pm go to [email or web address] - password [facility]  [facility] -  Office  [phone]

## 2016-03-20 NOTE — Progress Notes (Signed)
[facility] Rounding Note Subjective/Interval History:   Overall feeling better Elbow hurts Nose is stopped up Renal function rapidly improving past 48 hours IVF stopped yesterday  Objective Vital signs in last 24 hours: Vitals:   03/19/16 1931 03/19/16 2318 03/20/16 0237 03/20/16 0720  BP: (!) 167/70 (!) 151/60 (!) 160/65 137/75  Pulse: 77 91 76 72  Resp: 18 (!) 23 13 19   Temp: 98.4 F (36.9 C) 98.6 F (37 C) 98.5 F (36.9 C) 98.7 F (37.1 C)  TempSrc: Oral Oral Oral Oral  SpO2: 100% 97% 95% 93%  Weight:   81.4 kg (179 lb 7.3 oz)    Weight change: 1.7 kg (3 lb 12 oz)  Intake/Output Summary (Last 24 hours) at 03/20/16 1418 Last data filed at 03/20/16 0843  Gross per 24 hour  Intake              720 ml  Output             1500 ml  Net             -780 ml   Physical Exam:  Blood pressure 137/75, pulse 72, temperature 98.7 F (37.1 C), temperature source Oral, resp. rate 19, weight 81.4 kg (179 lb 7.3 oz), SpO2 93 %.   Pleasant elderly WF Sitting up in the chair NAD Scars over both shoulders, R knee Pleasant, conversational, but some problems keeping train of thought No JVD Lungs clear S1S2 No S3 Abd soft and not tender Some pitting edema both legs, chronic changes, venous varicosities Mild myoclonus - intermittent vs asterixus Left arm in a sling Joint deformities hands with Heberden's and Bouchard's nodes R>L    Recent Labs Lab 03/17/16 2205 03/18/16 0600 03/18/16 0731 03/19/16 0305 03/20/16 0210  NA 135 136  --  138 138  K 5.1 4.3  --  4.2 4.1  CL 100* 104  --  109 110  CO2 17* 16*  --  19* 19*  GLUCOSE 170* 99  --  90 107*  BUN 82* 79*  --  74* 64*  CREATININE 6.42* 6.05* 6.06* 5.03* 4.04*  CALCIUM 9.1 7.9*  --  7.8* 8.0*     Recent Labs Lab 03/17/16 2205 03/18/16 0600  AST 25 18  ALT 14 13*  ALKPHOS 80 63  BILITOT 0.5 0.9  PROT 7.1 5.4*  ALBUMIN 4.2 3.1*    Recent Labs Lab 03/18/16 0731  AMMONIA 26    Recent Labs Lab 03/18/16 0600  03/18/16 0731 03/19/16 0305 03/20/16 0210  WBC 6.9 7.4 6.7 8.1  HGB 8.5* 9.2* 8.8* 9.3*  HCT 27.1* 29.0* 28.2* 29.9*  MCV 86.3 87.1 87.3 87.4  PLT 789* 793* 763* 866*   Iron/TIBC/Ferritin/ %Sat    Component Value Date/Time   IRON 20 (L) 03/18/2016 0731   TIBC 315 03/18/2016 0731   FERRITIN 73 03/18/2016 0731   IRONPCTSAT 6 (L) 03/18/2016 0731     Studies/Results: Dg Elbow 2 Views Left  Result Date: 03/19/2016 CLINICAL DATA:  History of olecranon fracture status post casting EXAM: LEFT ELBOW - 2 VIEW COMPARISON:  03/17/2016 FINDINGS: Olecranon fracture is again identified with some distraction of the fracture fragments. No other focal abnormality is seen. Casting material is noted posteriorly. IMPRESSION: Stable olecranon fracture Electronically Signed   By: Inez Catalina M.D.   On: 03/19/2016 08:42   Medications:  .  stroke: mapping our early stages of recovery book   Does not apply Once  . aspirin EC  81 mg  Oral Daily  . cyanocobalamin  1,000 mcg Intramuscular Daily  . docusate sodium  100 mg Oral Daily  . gabapentin  300 mg Oral QHS  . heparin  5,000 Units Subcutaneous Q8H  . loratadine  10 mg Oral Daily    Background: 76 y.o. year-old with longstanding arthritis, h/o multiple orthopedic surgeries over the years, chronic edema, HTN, neuropathy (gabapentin), fibromyalgia, chronic pain. Brought to the ED by her daughter after multiple falls, with back pain, elbow pain and confusion. Was taking a number of medications including xanax, gabapentin, oxycodone, indomethacin (started about 2 weeks ago by Dr. Cristal Deer and taking BID) with med rec that also included ibuprofen (she tells me she wasn't taking the ibuprofen). Was found to have a creatinine of 6.42 (0.96 Nov 1), bicarb of 16, Hb 8.5. XRays showed L  Olecranon fracture. Was admitted, given IVF's at 100cc/hour with creatinine today down to 5.03 and making urine. However, because of LE edema IV's stopped (pt says has had edema  for "years" and takes small dose of torsemide for this). UA neg for blood or protein, [place] 11 cm kidneys bilaterally, echodense, L simple cyst. We were asked to see. No hypotension documented. No ACE or ARB  Assessment  1. AKI - most likely related to heavy NSAID use.  UA neg, US echogenic kidneys with simple cyst of no consequence, no obstruction, SPEP normal, urine immunofix neg. Improved after IVF, discontinuation of NSAIDS . Non-oliguric. No dialysis indications. Creatinine continues to improve.  2. Metabolic acidosis - 2/2 #1 improving 3. Anemia  - with associated Fe deficiency/tsat of 6% 4. Edema - chronic according to pt. Normal LVEF by ECHO. Takes low dose torsemide chronically 5. L olecranon fracture post fall - awaiting repair Monday 6. Chronic pain syndrome - on MULTIPLE pain meds (narcotics + benzos + gabapentin) 7. ? UTI - urine culture negative/to finish course of Rocephin 8. Confusion - clearing. Still w/mild myoclonus. 2/2 renal failure + narcs + gabapentin (900 mg/day PTA)  Recommendations: 1. OK that IVF's stopped (though no CHF and edema sounds chronic) - should be able to hydrate herself at this point 2. Renally adjust meds (as you have done) 3. Continue to discourage use of NSAIDS (holding all at this time) 4. Continue to hold pt's torsemide for now 5. Dose of feraheme given 1/26 for extremely low transferrin saturation and low serum Fe (and hemoccult stools) 6. Continue to trend labs, strict I/O  Will follow for another day and if creatinine continues to improve will s/o tomorrow.   Jamal Maes, MD [facility] [phone] pager 03/20/2016, 2:18 PM

## 2016-03-21 LAB — CBC
HCT: 33.1 % — ABNORMAL LOW (ref 36.0–46.0)
Hemoglobin: 10.3 g/dL — ABNORMAL LOW (ref 12.0–15.0)
MCH: 27.3 pg (ref 26.0–34.0)
MCHC: 31.1 g/dL (ref 30.0–36.0)
MCV: 87.8 fL (ref 78.0–100.0)
PLATELETS: 860 10*3/uL — AB (ref 150–400)
RBC: 3.77 MIL/uL — ABNORMAL LOW (ref 3.87–5.11)
RDW: 14.9 % (ref 11.5–15.5)
WBC: 8.7 10*3/uL (ref 4.0–10.5)

## 2016-03-21 LAB — BASIC METABOLIC PANEL
ANION GAP: 9 (ref 5–15)
BUN: 55 mg/dL — ABNORMAL HIGH (ref 6–20)
CALCIUM: 8.6 mg/dL — AB (ref 8.9–10.3)
CO2: 19 mmol/L — ABNORMAL LOW (ref 22–32)
Chloride: 110 mmol/L (ref 101–111)
Creatinine, Ser: 2.94 mg/dL — ABNORMAL HIGH (ref 0.44–1.00)
GFR calc non Af Amer: 14 mL/min — ABNORMAL LOW (ref >60)
GFR, EST AFRICAN AMERICAN: 17 mL/min — AB (ref >60)
GLUCOSE: 156 mg/dL — AB (ref 65–99)
Potassium: 5.1 mmol/L (ref 3.5–5.1)
Sodium: 138 mmol/L (ref 135–145)

## 2016-03-21 MED ORDER — MORPHINE SULFATE (PF) 2 MG/ML IV SOLN
1.0000 mg | Freq: Once | INTRAVENOUS | Status: AC
  Administered 2016-03-21: 1 mg via INTRAVENOUS
  Filled 2016-03-21: qty 1

## 2016-03-21 MED ORDER — SODIUM POLYSTYRENE SULFONATE 15 GM/60ML PO SUSP
15.0000 g | Freq: Once | ORAL | Status: AC
  Administered 2016-03-21: 15 g via ORAL
  Filled 2016-03-21: qty 60

## 2016-03-21 MED ORDER — WHITE PETROLATUM GEL
Status: AC
  Administered 2016-03-21: 21:00:00
  Filled 2016-03-21: qty 1

## 2016-03-21 MED ORDER — AMLODIPINE BESYLATE 10 MG PO TABS: 10.0000 mg | ORAL_TABLET | Freq: Every day | ORAL | Status: DC

## 2016-03-21 MED ORDER — METOPROLOL TARTRATE 25 MG PO TABS
25.0000 mg | ORAL_TABLET | Freq: Two times a day (BID) | ORAL | Status: DC
  Administered 2016-03-21 – 2016-03-24 (×6): 25 mg via ORAL
  Filled 2016-03-21 (×7): qty 1

## 2016-03-21 NOTE — Progress Notes (Signed)
Orthopedic Tech Progress Note Patient Details:  Shirley [name] 02/23/1940 [id number]  Ortho Devices Type of Ortho Device: Post (long arm) splint Ortho Device/Splint Location: lue Ortho Device/Splint Interventions: Ordered, Application Reapplied splint due to swelling.  Karolee Stamps 03/21/2016, 8:16 PM

## 2016-03-21 NOTE — Progress Notes (Signed)
PRN dose of hydralazine given for BP of 166/61. Will continue to monitor patient.     03/21/16 2131  Vitals  BP (!) 166/61  MAP (mmHg) 90  BP Location Right Arm  BP Method Automatic  Patient Position (if appropriate) Lying

## 2016-03-21 NOTE — Progress Notes (Signed)
While receiving bedside report the patient complained of pain in her left hand beneath her ace wrap/splint. Upon further assessment I found the patients had to be red and edematous with a radial pulse and capillary refill less than 3 seconds. I paged the orthopedic technician who  assessed the patient and determine that it would be best to re-wrap her left arm. Ainsley Spinner, Orthopedic PA was paged who gave verbal order to re-wrapp arm with splint and ace wrap. Patient given one time 1mg  dose of morphine to help with pain of re-wrapping the extremity. Will continue to monitor patient. Lenna Sciara, RN

## 2016-03-21 NOTE — Progress Notes (Signed)
[facility] Rounding Note Subjective/Interval History:   Renal function continues to improve off IVF No new complaints  Objective Vital signs in last 24 hours: Vitals:   03/20/16 2044 03/20/16 2356 03/21/16 0418 03/21/16 0819  BP: (!) 148/79 (!) 178/78 (!) 151/77 (!) 184/88  Pulse: 88 72 79   Resp: 17 18 14    Temp: 97.4 F (36.3 C) 98.2 F (36.8 C) 98 F (36.7 C) 98 F (36.7 C)  TempSrc: Oral Oral Oral Oral  SpO2: 99% 96% 96%   Weight:   79.6 kg (175 lb 7.8 oz)    Weight change: -1.8 kg (-3 lb 15.5 oz)  Intake/Output Summary (Last 24 hours) at 03/21/16 1100 Last data filed at 03/20/16 2000  Gross per 24 hour  Intake              240 ml  Output                0 ml  Net              240 ml   Physical Exam:  Blood pressure (!) 184/88, pulse 79, temperature 98 F (36.7 C), temperature source Oral, resp. rate 14, weight 79.6 kg (175 lb 7.8 oz), SpO2 96 %.   Pleasant elderly WF Sitting up in the chair NAD Scars over both shoulders, R knee Pleasant, conversational, but some problems keeping train of thought No JVD Lungs clear S1S2 No S3 Abd soft and not tender Some chronic appearing pitting edema both legs, chronic skin changes, venous varicosities Left arm in a sling Joint deformities hands with Heberden's and Bouchard's nodes R>L    Recent Labs Lab 03/17/16 2205 03/18/16 0600 03/18/16 0731 03/19/16 0305 03/20/16 0210 03/21/16 0258  NA 135 136  --  138 138 138  K 5.1 4.3  --  4.2 4.1 5.1  CL 100* 104  --  109 110 110  CO2 17* 16*  --  19* 19* 19*  GLUCOSE 170* 99  --  90 107* 156*  BUN 82* 79*  --  74* 64* 55*  CREATININE 6.42* 6.05* 6.06* 5.03* 4.04* 2.94*  CALCIUM 9.1 7.9*  --  7.8* 8.0* 8.6*     Recent Labs Lab 03/17/16 2205 03/18/16 0600  AST 25 18  ALT 14 13*  ALKPHOS 80 63  BILITOT 0.5 0.9  PROT 7.1 5.4*  ALBUMIN 4.2 3.1*    Recent Labs Lab 03/18/16 0731  AMMONIA 26    Recent Labs Lab 03/18/16 0731 03/19/16 0305 03/20/16 0210  03/21/16 0258  WBC 7.4 6.7 8.1 8.7  HGB 9.2* 8.8* 9.3* 10.3*  HCT 29.0* 28.2* 29.9* 33.1*  MCV 87.1 87.3 87.4 87.8  PLT 793* 763* 866* 860*   Iron/TIBC/Ferritin/ %Sat    Component Value Date/Time   IRON 20 (L) 03/18/2016 0731   TIBC 315 03/18/2016 0731   FERRITIN 73 03/18/2016 0731   IRONPCTSAT 6 (L) 03/18/2016 0731     Studies/Results: No results found. Dg Chest 1 View  Result Date: 03/18/2016 CLINICAL DATA:  Altered mental status EXAM: CHEST 1 VIEW COMPARISON:  05/09/2014 CXR FINDINGS: Heart is enlarged. The thoracic aorta is tortuous and partially calcified. Mild interstitial prominence is noted, chronic in appearance without pneumonic consolidation, effusion or pneumothorax. Surgical clips project over both humeral heads. Single screw projects over the right scapula and axilla. Spurring is noted off both humeral heads. IMPRESSION: Stable cardiomegaly. No acute pulmonary disease. Aortic atherosclerosis. Electronically Signed   By: Meredith Leeds.D.  On: 03/18/2016 01:36   Dg Pelvis 1-2 Views  Result Date: 03/18/2016 CLINICAL DATA:  Initial evaluation for acute trauma, fall. EXAM: PELVIS - 1-2 VIEW COMPARISON:  Prior MRI from 01/06/2016. FINDINGS: Postsurgical changes present within the lower lumbar spine and about the right aspect of the sacrum. Bony pelvis intact. SI joints approximated. No pubic diastasis. No acute osseous abnormality about the right hip. Severe degenerative osteoarthritic changes present about the left hip with flattening and sclerosis of the left femoral head, similar to previous, and likely related to degenerative osteoarthritic changes. No acute fracture identified. Visualized soft tissues within normal limits. IMPRESSION: 1. 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 Changes are grossly similar as compared to previous MRI from 01/06/2016. No definite superimposed acute fracture or dislocation. If there is high clinical suspicion for possible  occult hip fracture, further evaluation with dedicated MRI could be performed as indicated. 2. No other acute osseous abnormality about the pelvis. Electronically Signed   By: Jeannine Boga M.D.   On: 03/18/2016 06:46   Dg Elbow 2 Views Left  Result Date: 03/19/2016 CLINICAL DATA:  History of olecranon fracture status post casting EXAM: LEFT ELBOW - 2 VIEW COMPARISON:  03/17/2016 FINDINGS: Olecranon fracture is again identified with some distraction of the fracture fragments. No other focal abnormality is seen. Casting material is noted posteriorly. IMPRESSION: Stable olecranon fracture Electronically Signed   By: Inez Catalina M.D.   On: 03/19/2016 08:42   Dg Forearm Left  Result Date: 03/17/2016 CLINICAL DATA:  76 year old female with fall and pain in the left forearm. EXAM: LEFT FOREARM - 2 VIEW COMPARISON:  Left wrist radiograph dated 03/24/2015 FINDINGS: There is a displaced fracture of the proximal ulna involving the olecranon process with approximately 2 cm distraction. There is a a nondisplaced linear fracture in the proximal ulna extending into the articular surface. No other acute fracture identified. The bones are osteopenic. Distal radial fixation plate and screws noted from prior fracture. There is no dislocation. The radiocapitellar alignment is preserved. There are degenerative changes of the carpal bones. There is soft tissue swelling of the elbow and proximal forearm. No radiopaque foreign object or soft tissue gas. IMPRESSION: Displaced fracture of the olecranon process of the ulna with inter articular extension of the fracture. No dislocation. Additional fractures may be present but not evident due to osteopenia. Dedicated radiograph of the elbow or CT is recommended for further evaluation. Electronically Signed   By: Anner Crete M.D.   On: 03/17/2016 22:26   Ct Head Wo Contrast  Result Date: 03/18/2016 CLINICAL DATA:  Status post fall multiple times, with confusion. Concern  for head or cervical spine injury. Initial encounter. EXAM: CT HEAD WITHOUT CONTRAST CT CERVICAL SPINE WITHOUT CONTRAST TECHNIQUE: Multidetector CT imaging of the head and cervical spine was performed following the standard protocol without intravenous contrast. Multiplanar CT image reconstructions of the cervical spine were also generated. 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 No evidence of hemorrhage, hydrocephalus, extra-axial collection or mass lesion/mass effect. Prominence of the ventricles and sulci reflects mild cortical volume loss. Scattered periventricular and subcortical white matter change likely reflects small vessel ischemic microangiopathy. The brainstem and fourth ventricle are within normal limits. The basal ganglia are unremarkable in appearance. The cerebral hemispheres demonstrate grossly normal gray-white differentiation.  No mass effect or midline shift is seen. Vascular: No hyperdense vessel or unexpected calcification. Skull: There is no evidence of fracture; visualized osseous structures are unremarkable in appearance. Sinuses/Orbits: The orbits are within normal limits. There is mild partial opacification of the sphenoid sinus. The remaining paranasal sinuses and mastoid air cells are well-aerated. Other: No significant soft tissue abnormalities are seen. CT CERVICAL SPINE FINDINGS Alignment: There is grade 1 anterolisthesis of C3 on C4. Underlying facet disease is noted. Skull base and vertebrae: No acute fracture. No primary bone lesion or focal pathologic process. Soft tissues and spinal canal: No prevertebral fluid or swelling. No visible canal hematoma. Disc levels: Multilevel disc space narrowing is noted along the lower cervical spine, with enlarged anterior disc osteophyte  complexes. Mild underlying facet disease is noted. Upper chest: Hypodensities within the thyroid gland measure up to 1.6 cm on the left. The visualized lung apices are grossly clear. Scattered calcification is seen at the left lung apex. Other: No additional soft tissue abnormalities are seen. IMPRESSION: 1. No evidence of traumatic intracranial injury or fracture. 2. No evidence of acute fracture or subluxation along the cervical spine. 3. 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 Would correlate for any associated symptoms. 4. Mild cortical volume loss and scattered small vessel ischemic microangiopathy. 5. Mild degenerative change along the cervical spine, with grade 1 anterolisthesis of C3 on C4. 6. Mild partial opacification of the sphenoid sinus. 7. **An incidental finding of potential clinical significance has been found. Hypodensities within the thyroid gland measure up to 1.6 cm on the left. Consider further evaluation with thyroid ultrasound. If patient is clinically hyperthyroid, consider nuclear medicine thyroid uptake and scan.** Electronically Signed   By: Garald Balding M.D.   On: 03/18/2016 01:35   Ct Cervical Spine Wo Contrast  Result Date: 03/18/2016 CLINICAL DATA:  Status post fall multiple times, with confusion. Concern for head or cervical spine injury. Initial encounter. EXAM: CT HEAD WITHOUT CONTRAST CT CERVICAL SPINE WITHOUT CONTRAST TECHNIQUE: Multidetector CT imaging of the head and cervical spine was performed following the standard protocol without intravenous contrast. Multiplanar CT image reconstructions of the cervical spine were also generated. 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 No evidence of hemorrhage,  hydrocephalus, extra-axial collection or mass lesion/mass effect. Prominence of the ventricles and sulci reflects mild cortical volume loss. Scattered periventricular and subcortical white matter change likely reflects small vessel ischemic microangiopathy. The brainstem and fourth ventricle are within normal limits. The basal ganglia are unremarkable in appearance. The cerebral hemispheres demonstrate grossly normal gray-white differentiation. No mass effect or midline shift is seen. Vascular: No hyperdense vessel or unexpected calcification. Skull: There is no evidence of fracture; visualized osseous structures are unremarkable in appearance. Sinuses/Orbits: The orbits are within normal limits. There is mild partial opacification of the sphenoid sinus. The remaining paranasal sinuses and mastoid air cells are well-aerated. Other: No significant soft tissue abnormalities are seen. CT CERVICAL SPINE FINDINGS Alignment: There is grade 1 anterolisthesis of C3 on C4. Underlying facet disease is noted. Skull base and vertebrae: No acute fracture. No primary bone lesion or focal pathologic process. Soft tissues and spinal canal: No prevertebral fluid or swelling. No visible canal hematoma. Disc levels: Multilevel disc space narrowing is noted  along the lower cervical spine, with enlarged anterior disc osteophyte complexes. Mild underlying facet disease is noted. Upper chest: Hypodensities within the thyroid gland measure up to 1.6 cm on the left. The visualized lung apices are grossly clear. Scattered calcification is seen at the left lung apex. Other: No additional soft tissue abnormalities are seen. IMPRESSION: 1. No evidence of traumatic intracranial injury or fracture. 2. No evidence of acute fracture or subluxation along the cervical spine. 3. 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 Would correlate for any associated symptoms. 4. Mild cortical volume loss and  scattered small vessel ischemic microangiopathy. 5. Mild degenerative change along the cervical spine, with grade 1 anterolisthesis of C3 on C4. 6. Mild partial opacification of the sphenoid sinus. 7. **An incidental finding of potential clinical significance has been found. Hypodensities within the thyroid gland measure up to 1.6 cm on the left. Consider further evaluation with thyroid ultrasound. If patient is clinically hyperthyroid, consider nuclear medicine thyroid uptake and scan.** Electronically Signed   By: Garald Balding M.D.   On: 03/18/2016 01:35   Mr Brain Wo Contrast  Result Date: 03/18/2016 CLINICAL DATA:  Fall and confusion. EXAM: MRI HEAD WITHOUT CONTRAST TECHNIQUE: Multiplanar, multiecho pulse sequences of the brain and surrounding structures were obtained without intravenous contrast. COMPARISON:  10/07/2003 FINDINGS: Brain: No acute infarction, hemorrhage, hydrocephalus, extra-axial collection or mass lesion. 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 Confluent gliosis in the cerebral white matter attributed to chronic microvascular disease given comorbidities and patient age. These changes have significantly progressed since 2005. Milder microvascular ischemic change in the pons. Normal brain volume. Vascular: Preserved flow voids Skull and upper cervical spine: No acute finding Sinuses/Orbits: Bilateral cataract resection. Chronic fluid and/or mucosal thickening in the right mastoid air cells. Other: Intermittently motion degraded exam which decreases diagnostic sensitivity. IMPRESSION: 1. No acute finding. 2. 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 3. 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 Electronically Signed   By: Monte Fantasia M.D.   On: 03/18/2016 09:23   US Renal  Result Date: 03/18/2016 CLINICAL DATA:  76 year old female with acute renal failure. EXAM: RENAL /  URINARY TRACT ULTRASOUND COMPLETE COMPARISON:  None. FINDINGS: Right Kidney: Length: 11 cm. There is increased renal echotexture. No hydronephrosis or echogenic stone. Left Kidney: Length: 11 cm. There is increased renal echogenicity. There is a 1.0 x 0.8 x 1.3 cm cyst in the left kidney. Bladder: Appears normal for degree of bladder distention. IMPRESSION: Increased renal echotexture compatible with underlying chronic medical renal disease. No hydronephrosis or echogenic stone. Electronically Signed   By: Anner Crete M.D.   On: 03/18/2016 03:22   Medications:  . aspirin EC  81 mg Oral Daily  . docusate sodium  100 mg Oral Daily  . gabapentin  300 mg Oral QHS  . heparin  5,000 Units Subcutaneous Q8H  . loratadine  10 mg Oral Daily    Background: 76 y.o. year-old with longstanding arthritis, h/o multiple orthopedic surgeries over the years, chronic edema, HTN, neuropathy (gabapentin), fibromyalgia, chronic pain. Brought to the ED by her daughter after multiple falls, with back pain, elbow pain and confusion. Was taking a number of medications including xanax, gabapentin, oxycodone, indomethacin (started about 2 weeks ago by Dr. Cristal Deer and taking BID) with med rec that also included ibuprofen (she tells me she wasn't taking the ibuprofen). Was found  to have a creatinine of 6.42 (0.96 Nov 1), bicarb of 16, Hb 8.5. XRays showed L  Olecranon fracture. Was admitted, given IVF's at 100cc/hour with creatinine today down to 5.03 and making urine. However, because of LE edema IV's stopped (pt says has had edema for "years" and takes small dose of torsemide for this). UA neg for blood or protein, [place] 11 cm kidneys bilaterally, echodense, L simple cyst. We were asked to see. No hypotension documented. No ACE or ARB  Assessment/Recommendations  1. AKI - most likely related to heavy NSAID use.  UA neg, US echogenic kidneys with simple cyst of no consequence, no obstruction, SPEP normal, urine immunofix  neg. Improved after IVF, discontinuation of NSAIDS . Non-oliguric. No dialysis indications. Creatinine continues to improve off IVF's. K drifting up just a bit - continue to trend labs.  2. Metabolic acidosis - 2/2 #1 stable bicarb around 19 and should correct as renal function improves 3. Anemia  - with associated Fe deficiency/tsat of 6%. Dose of feraheme given 1/26 for extremely low transferrin saturation and low serum Fe. Current Hb 10.3. 4. Edema - chronic according to pt. Normal LVEF by ECHO. Takes low dose torsemide chronically. Continue to hold for right now. 5. L olecranon fracture post fall - awaiting repair Monday 6. Chronic pain syndrome - on MULTIPLE pain meds (narcotics + benzos + gabapentin) 7. ? UTI - urine culture negative/ finished course of Rocephin 8. Confusion - resolved   As renal function continues to improve fairly rapidly, adding nothing to management at this time. Renal will sign off. Please call if questions.   Jamal Maes, MD [facility] [phone] pager 03/21/2016, 11:00 AM

## 2016-03-21 NOTE — Progress Notes (Addendum)
PROGRESS NOTE                                                                                                                                                                                                             Patient Demographics:    Shirley [name], is a 76 y.o. female, DOB - 1940-04-26, GDJ:[id number]  Admit date - 03/17/2016   Admitting Physician Rise Patience, MD  Outpatient Primary MD for the patient is HEDGECOCK,SUZANNE, PA-C  LOS - 3  Chief Complaint  Patient presents with  . Altered Mental Status       Brief Narrative   76 y.o. female with history of chronic pain and arthritis was recently placed on indomethacin last 2 weeks was brought to the ER after patient was found to be confused and had a fall, Workup significant for acute renal failure with a creatinine of 6.5, with anion gap of 18, possible UTI, CT with possible subacute occipital infarct, but no evidence of acute infarct on MRI brain,she has left elbow olecranon fracture, seen by orthopedic with the plan for surgery this coming Monday.   Subjective:    Shirley [name] today has, No headache, No chest pain, No abdominal pain - No Nausea, Report is feeling better, reports left elbow pain is controlled .    Assessment  & Plan :    Principal Problem:   Acute renal failure (HCC) Active Problems:   Chronic pain syndrome   Acute encephalopathy   Elbow fracture, left, closed, initial encounter  Acute renal failure  - Normal baseline creatinine, 6.5 on admission . - Renal input greatly appreciated, achy I most likely related to heavy NSAIDs use, IV fluids stopped giving lower extremity edema, negative urinalysis, renal ultrasound with simple cysts, no obstruction, SPEP normal, urine immunofixation negative. - Continue to improve, but still significantly elevated, will need close monitoring, will continue to monitor BMP daily - Continue to hold Demadex and NSAIDs - Potassium is  5.1 today, will give one dose of Kayexalate to prevent hyperkalemia in a.m. prior to surgery  Acute encephalopathy  - most likely multifactorial including metabolic encephalopathy secondary to electrolyte changes, acute renal failure, and pain medication. - Resolved, back to baseline, continue with pain meds and gabapentin at a lower dose. - CT showing possible subacute infarct,But MRI brain with no acute findings .  UTI - no pyuria but significant bacteriuria , urine cultures aren't helpful as growing multiple species, treated with 3 days of Rocephin .  SVT - Patient had frequent SVT on telemetry monitor, recent echo done with EF 65%, no regional wall motion abnormalities, discussed with cardiology Dr. Oval Linsey, she'll need outpatient follow-up with cardiology, and to start on beta blockers if blood pressure allows, started on metoprolol 25 mg twice a day, no further workup indicated before left elbow repair tomorrow, and she is optimized for surgery.  Hypertension - Blood pressure is currently uncontrolled, start on metoprolol 25 mg oral twice a day should in the setting of SVT, continue with when necessary hydralazine.  Chronic pain syndrome and arthritis  - decreased patient's oxycodone from 30 mg every 6 hourly to 10 mg every 6 when necessary and decreased gabapentin from 300 mg 3 times a day to 300 mg at bedtime due to renal failure.   Left olecranon fracture  - orthopedic consult appreciated, plan for surgical repair on Monday  Chronic anemia  - Workup significant for low iron and B-12 level, continue with B-12 supplement, received IV iron by renal, Hemoccult negative, will need iron deficiency anemia as outpatient.   Code Status : Full  Family Communication  : None at bedside  Disposition Plan  : Pending PT/CT evaluation, recommendation for CIR, will transfer to [facility] floor.  Consults  :  Ortho Dr Marcelino Scot, renal  Procedures  : None  DVT Prophylaxis  :   Heparin - SCDs   Lab  Results  Component Value Date   PLT 860 (H) 03/21/2016    Antibiotics  :    Anti-infectives    Start     Dose/Rate Route Frequency Ordered Stop   03/19/16 0600  cefTRIAXone (ROCEPHIN) 1 g in dextrose 5 % 50 mL IVPB  Status:  Discontinued     1 g 100 mL/hr over 30 Minutes Intravenous Every 24 hours 03/18/16 0547 03/20/16 1142   03/18/16 0330  cefTRIAXone (ROCEPHIN) 1 g in dextrose 5 % 50 mL IVPB     1 g 100 mL/hr over 30 Minutes Intravenous  Once 03/18/16 0329 03/18/16 0425        Objective:   Vitals:   03/20/16 2356 03/21/16 0418 03/21/16 0819 03/21/16 1043  BP: (!) 178/78 (!) 151/77 (!) 184/88 (!) 163/70  Pulse: 72 79  76  Resp: 18 14  17   Temp: 98.2 F (36.8 C) 98 F (36.7 C) 98 F (36.7 C)   TempSrc: Oral Oral Oral   SpO2: 96% 96%  98%  Weight:  79.6 kg (175 lb 7.8 oz)      Wt Readings from Last 3 Encounters:  03/21/16 79.6 kg (175 lb 7.8 oz)  01/06/16 81.6 kg (180 lb)  12/22/15 78.9 kg (174 lb)     Intake/Output Summary (Last 24 hours) at 03/21/16 1246 Last data filed at 03/20/16 2000  Gross per 24 hour  Intake              240 ml  Output                0 ml  Net              240 ml     Physical Exam  Awake Alert, Oriented X 3,  Supple Neck,No JVD,  Symmetrical Chest wall movement, Good air movement bilaterally, CTAB RRR,No Gallops,Rubs or new Murmurs, No Parasternal Heave +ve B.Sounds, Abd Soft, No tenderness,, No rebound -  guarding or rigidity. No Cyanosis, Clubbing , +2 edema, left arm in a cast/sling    Data Review:    CBC  Recent Labs Lab 03/18/16 0600 03/18/16 0731 03/19/16 0305 03/20/16 0210 03/21/16 0258  WBC 6.9 7.4 6.7 8.1 8.7  HGB 8.5* 9.2* 8.8* 9.3* 10.3*  HCT 27.1* 29.0* 28.2* 29.9* 33.1*  PLT 789* 793* 763* 866* 860*  MCV 86.3 87.1 87.3 87.4 87.8  MCH 27.1 27.6 27.2 27.2 27.3  MCHC 31.4 31.7 31.2 31.1 31.1  RDW 14.6 15.0 14.9 14.8 14.9    Chemistries   Recent Labs Lab 03/17/16 2205 03/18/16 0600 03/18/16 0731  03/19/16 0305 03/20/16 0210 03/21/16 0258  NA 135 136  --  138 138 138  K 5.1 4.3  --  4.2 4.1 5.1  CL 100* 104  --  109 110 110  CO2 17* 16*  --  19* 19* 19*  GLUCOSE 170* 99  --  90 107* 156*  BUN 82* 79*  --  74* 64* 55*  CREATININE 6.42* 6.05* 6.06* 5.03* 4.04* 2.94*  CALCIUM 9.1 7.9*  --  7.8* 8.0* 8.6*  AST 25 18  --   --   --   --   ALT 14 13*  --   --   --   --   ALKPHOS 80 63  --   --   --   --   BILITOT 0.5 0.9  --   --   --   --    ------------------------------------------------------------------------------------------------------------------ No results for input(s): CHOL, HDL, LDLCALC, TRIG, CHOLHDL, LDLDIRECT in the last 72 hours.  Lab Results  Component Value Date   HGBA1C 6.0 (H) 03/18/2016   ------------------------------------------------------------------------------------------------------------------ No results for input(s): TSH, T4TOTAL, T3FREE, THYROIDAB in the last 72 hours.  Invalid input(s): FREET3 ------------------------------------------------------------------------------------------------------------------ No results for input(s): VITAMINB12, FOLATE, FERRITIN, TIBC, IRON, RETICCTPCT in the last 72 hours.  Coagulation profile No results for input(s): INR, PROTIME in the last 168 hours.  No results for input(s): DDIMER in the last 72 hours.  Cardiac Enzymes No results for input(s): CKMB, TROPONINI, MYOGLOBIN in the last 168 hours.  Invalid input(s): CK ------------------------------------------------------------------------------------------------------------------ No results found for: BNP  Inpatient Medications  Scheduled Meds: . amLODipine  10 mg Oral Daily  . aspirin EC  81 mg Oral Daily  . docusate sodium  100 mg Oral Daily  . gabapentin  300 mg Oral QHS  . heparin  5,000 Units Subcutaneous Q8H  . loratadine  10 mg Oral Daily   Continuous Infusions: PRN Meds:.acetaminophen **OR** acetaminophen (TYLENOL) oral liquid 160 mg/5 mL  **OR** acetaminophen, ALPRAZolam, amphetamine-dextroamphetamine, hydrALAZINE, ondansetron **OR** ondansetron (ZOFRAN) IV, oxyCODONE  Micro Results Recent Results (from the past 240 hour(s))  Urine culture     Status: Abnormal   Collection Time: 03/18/16 12:52 AM  Result Value Ref Range Status   Specimen Description URINE, RANDOM  Final   Special Requests NONE  Final   Culture MULTIPLE SPECIES PRESENT, SUGGEST RECOLLECTION (A)  Final   Report Status 03/19/2016 FINAL  Final  MRSA PCR Screening     Status: None   Collection Time: 03/20/16 12:23 PM  Result Value Ref Range Status   MRSA by PCR NEGATIVE NEGATIVE Final    Comment:        The GeneXpert MRSA Assay (FDA approved for NASAL specimens only), is one component of a comprehensive MRSA colonization surveillance program. It is not intended to diagnose MRSA infection nor to guide or monitor treatment for MRSA infections.  Radiology Reports Dg Chest 1 View  Result Date: 03/18/2016 CLINICAL DATA:  Altered mental status EXAM: CHEST 1 VIEW COMPARISON:  05/09/2014 CXR FINDINGS: Heart is enlarged. The thoracic aorta is tortuous and partially calcified. Mild interstitial prominence is noted, chronic in appearance without pneumonic consolidation, effusion or pneumothorax. Surgical clips project over both humeral heads. Single screw projects over the right scapula and axilla. Spurring is noted off both humeral heads. IMPRESSION: Stable cardiomegaly. No acute pulmonary disease. Aortic atherosclerosis. Electronically Signed   By: Ashley Royalty M.D.   On: 03/18/2016 01:36   Dg Pelvis 1-2 Views  Result Date: 03/18/2016 CLINICAL DATA:  Initial evaluation for acute trauma, fall. EXAM: PELVIS - 1-2 VIEW COMPARISON:  Prior MRI from 01/06/2016. FINDINGS: Postsurgical changes present within the lower lumbar spine and about the right aspect of the sacrum. Bony pelvis intact. SI joints approximated. No pubic diastasis. No acute osseous abnormality about  the right hip. Severe degenerative osteoarthritic changes present about the left hip with flattening and sclerosis of the left femoral head, similar to previous, and likely related to degenerative osteoarthritic changes. No acute fracture identified. Visualized soft tissues within normal limits. IMPRESSION: 1. 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 Changes are grossly similar as compared to previous MRI from 01/06/2016. No definite superimposed acute fracture or dislocation. If there is high clinical suspicion for possible occult hip fracture, further evaluation with dedicated MRI could be performed as indicated. 2. No other acute osseous abnormality about the pelvis. Electronically Signed   By: Jeannine Boga M.D.   On: 03/18/2016 06:46   Dg Elbow 2 Views Left  Result Date: 03/19/2016 CLINICAL DATA:  History of olecranon fracture status post casting EXAM: LEFT ELBOW - 2 VIEW COMPARISON:  03/17/2016 FINDINGS: Olecranon fracture is again identified with some distraction of the fracture fragments. No other focal abnormality is seen. Casting material is noted posteriorly. IMPRESSION: Stable olecranon fracture Electronically Signed   By: Inez Catalina M.D.   On: 03/19/2016 08:42   Dg Forearm Left  Result Date: 03/17/2016 CLINICAL DATA:  76 year old female with fall and pain in the left forearm. EXAM: LEFT FOREARM - 2 VIEW COMPARISON:  Left wrist radiograph dated 03/24/2015 FINDINGS: There is a displaced fracture of the proximal ulna involving the olecranon process with approximately 2 cm distraction. There is a a nondisplaced linear fracture in the proximal ulna extending into the articular surface. No other acute fracture identified. The bones are osteopenic. Distal radial fixation plate and screws noted from prior fracture. There is no dislocation. The radiocapitellar alignment is preserved. There are degenerative changes of the carpal bones. There is soft tissue swelling of the  elbow and proximal forearm. No radiopaque foreign object or soft tissue gas. IMPRESSION: Displaced fracture of the olecranon process of the ulna with inter articular extension of the fracture. No dislocation. Additional fractures may be present but not evident due to osteopenia. Dedicated radiograph of the elbow or CT is recommended for further evaluation. Electronically Signed   By: Anner Crete M.D.   On: 03/17/2016 22:26   Ct Head Wo Contrast  Result Date: 03/18/2016 CLINICAL DATA:  Status post fall multiple times, with confusion. Concern for head or cervical spine injury. Initial encounter. EXAM: CT HEAD WITHOUT CONTRAST CT CERVICAL SPINE WITHOUT CONTRAST TECHNIQUE: Multidetector CT imaging of the head and cervical spine was performed following the standard protocol without intravenous contrast. Multiplanar CT image reconstructions of the cervical spine were also generated. 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 No evidence of hemorrhage, hydrocephalus, extra-axial collection or mass lesion/mass effect. Prominence of the ventricles and sulci reflects mild cortical volume loss. Scattered periventricular and subcortical white matter change likely reflects small vessel ischemic microangiopathy. The brainstem and fourth ventricle are within normal limits. The basal ganglia are unremarkable in appearance. The cerebral hemispheres demonstrate grossly normal gray-white differentiation. No mass effect or midline shift is seen. Vascular: No hyperdense vessel or unexpected calcification. Skull: There is no evidence of fracture; visualized osseous structures are unremarkable in appearance. Sinuses/Orbits: The orbits are within normal limits. There is mild partial opacification of the sphenoid sinus. The  remaining paranasal sinuses and mastoid air cells are well-aerated. Other: No significant soft tissue abnormalities are seen. CT CERVICAL SPINE FINDINGS Alignment: There is grade 1 anterolisthesis of C3 on C4. Underlying facet disease is noted. Skull base and vertebrae: No acute fracture. No primary bone lesion or focal pathologic process. Soft tissues and spinal canal: No prevertebral fluid or swelling. No visible canal hematoma. Disc levels: Multilevel disc space narrowing is noted along the lower cervical spine, with enlarged anterior disc osteophyte complexes. Mild underlying facet disease is noted. Upper chest: Hypodensities within the thyroid gland measure up to 1.6 cm on the left. The visualized lung apices are grossly clear. Scattered calcification is seen at the left lung apex. Other: No additional soft tissue abnormalities are seen. IMPRESSION: 1. No evidence of traumatic intracranial injury or fracture. 2. No evidence of acute fracture or subluxation along the cervical spine. 3. 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 Would correlate for any associated symptoms. 4. Mild cortical volume loss and scattered small vessel ischemic microangiopathy. 5. Mild degenerative change along the cervical spine, with grade 1 anterolisthesis of C3 on C4. 6. Mild partial opacification of the sphenoid sinus. 7. **An incidental finding of potential clinical significance has been found. Hypodensities within the thyroid gland measure up to 1.6 cm on the left. Consider further evaluation with thyroid ultrasound. If patient is clinically hyperthyroid, consider nuclear medicine thyroid uptake and scan.** Electronically Signed   By: Garald Balding M.D.   On: 03/18/2016 01:35   Ct Cervical Spine Wo Contrast  Result Date: 03/18/2016 CLINICAL DATA:  Status post fall multiple times, with confusion. Concern for head or cervical spine injury. Initial encounter. EXAM: CT HEAD  WITHOUT CONTRAST CT CERVICAL SPINE WITHOUT CONTRAST TECHNIQUE: Multidetector CT imaging of the head and cervical spine was performed following the standard protocol without intravenous contrast. Multiplanar CT image reconstructions of the cervical spine were also generated. 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 No evidence of hemorrhage, hydrocephalus, extra-axial collection or mass lesion/mass effect. Prominence of the ventricles and sulci reflects mild cortical volume loss. Scattered periventricular and subcortical white matter change likely reflects small vessel ischemic microangiopathy. The brainstem and fourth ventricle are within normal limits. The basal ganglia are unremarkable in appearance. The cerebral hemispheres demonstrate grossly normal gray-white differentiation. No mass effect or midline shift is seen. Vascular: No hyperdense vessel or unexpected calcification. Skull: There is no evidence of fracture; visualized osseous structures are  unremarkable in appearance. Sinuses/Orbits: The orbits are within normal limits. There is mild partial opacification of the sphenoid sinus. The remaining paranasal sinuses and mastoid air cells are well-aerated. Other: No significant soft tissue abnormalities are seen. CT CERVICAL SPINE FINDINGS Alignment: There is grade 1 anterolisthesis of C3 on C4. Underlying facet disease is noted. Skull base and vertebrae: No acute fracture. No primary bone lesion or focal pathologic process. Soft tissues and spinal canal: No prevertebral fluid or swelling. No visible canal hematoma. Disc levels: Multilevel disc space narrowing is noted along the lower cervical spine, with enlarged anterior disc osteophyte complexes. Mild underlying facet disease is noted. Upper chest:  Hypodensities within the thyroid gland measure up to 1.6 cm on the left. The visualized lung apices are grossly clear. Scattered calcification is seen at the left lung apex. Other: No additional soft tissue abnormalities are seen. IMPRESSION: 1. No evidence of traumatic intracranial injury or fracture. 2. No evidence of acute fracture or subluxation along the cervical spine. 3. 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 Would correlate for any associated symptoms. 4. Mild cortical volume loss and scattered small vessel ischemic microangiopathy. 5. Mild degenerative change along the cervical spine, with grade 1 anterolisthesis of C3 on C4. 6. Mild partial opacification of the sphenoid sinus. 7. **An incidental finding of potential clinical significance has been found. Hypodensities within the thyroid gland measure up to 1.6 cm on the left. Consider further evaluation with thyroid ultrasound. If patient is clinically hyperthyroid, consider nuclear medicine thyroid uptake and scan.** Electronically Signed   By: Garald Balding M.D.   On: 03/18/2016 01:35   Mr Brain Wo Contrast  Result Date: 03/18/2016 CLINICAL DATA:  Fall and confusion. EXAM: MRI HEAD WITHOUT CONTRAST TECHNIQUE: Multiplanar, multiecho pulse sequences of the brain and surrounding structures were obtained without intravenous contrast. COMPARISON:  10/07/2003 FINDINGS: Brain: No acute infarction, hemorrhage, hydrocephalus, extra-axial collection or mass lesion. 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 Confluent gliosis in the cerebral white matter attributed to chronic microvascular disease given comorbidities and patient age. These changes have significantly progressed since 2005. Milder microvascular ischemic change in the pons. Normal brain volume. Vascular: Preserved flow voids Skull and upper cervical spine: No acute finding Sinuses/Orbits: Bilateral cataract  resection. Chronic fluid and/or mucosal thickening in the right mastoid air cells. Other: Intermittently motion degraded exam which decreases diagnostic sensitivity. IMPRESSION: 1. No acute finding. 2. 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 3. 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 Electronically Signed   By: Monte Fantasia M.D.   On: 03/18/2016 09:23   US Renal  Result Date: 03/18/2016 CLINICAL DATA:  76 year old female with acute renal failure. EXAM: RENAL / URINARY TRACT ULTRASOUND COMPLETE COMPARISON:  None. FINDINGS: Right Kidney: Length: 11 cm. There is increased renal echotexture. No hydronephrosis or echogenic stone. Left Kidney: Length: 11 cm. There is increased renal echogenicity. There is a 1.0 x 0.8 x 1.3 cm cyst in the left kidney. Bladder: Appears normal for degree of bladder distention. IMPRESSION: Increased renal echotexture compatible with underlying chronic medical renal disease. No hydronephrosis or echogenic stone. Electronically Signed   By: Anner Crete M.D.   On: 03/18/2016 03:22     Waldron Labs, DAWOOD M.D on 03/21/2016 at 12:46 PM  Between 7am to 7pm - Pager - [phone]  After 7pm go to [email or web address] - password [email or web address]  [facility] -  Office  [phone]

## 2016-03-22 ENCOUNTER — Inpatient Hospital Stay (HOSPITAL_COMMUNITY): Payer: PPO

## 2016-03-22 ENCOUNTER — Inpatient Hospital Stay (HOSPITAL_COMMUNITY): Payer: PPO | Admitting: Anesthesiology

## 2016-03-22 ENCOUNTER — Encounter (HOSPITAL_COMMUNITY)
Admission: EM | Disposition: A | Discharge status: Home Health | Payer: Self-pay | Source: Home / Self Care | Attending: Internal Medicine

## 2016-03-22 DIAGNOSIS — G9340 Encephalopathy, unspecified: Secondary | ICD-10-CM

## 2016-03-22 DIAGNOSIS — S52022A Displaced fracture of olecranon process without intraarticular extension of left ulna, initial encounter for closed fracture: Secondary | ICD-10-CM | POA: Diagnosis present

## 2016-03-22 HISTORY — PX: ORIF ELBOW FRACTURE: SHX5031

## 2016-03-22 LAB — GLUCOSE, CAPILLARY: Glucose-Capillary: 118 mg/dL — ABNORMAL HIGH (ref 65–99)

## 2016-03-22 LAB — CBC
HEMATOCRIT: 32 % — AB (ref 36.0–46.0)
HEMOGLOBIN: 10.1 g/dL — AB (ref 12.0–15.0)
MCH: 27.4 pg (ref 26.0–34.0)
MCHC: 31.6 g/dL (ref 30.0–36.0)
MCV: 87 fL (ref 78.0–100.0)
Platelets: 1055 10*3/uL (ref 150–400)
RBC: 3.68 MIL/uL — ABNORMAL LOW (ref 3.87–5.11)
RDW: 15.1 % (ref 11.5–15.5)
WBC: 8.6 10*3/uL (ref 4.0–10.5)

## 2016-03-22 LAB — BASIC METABOLIC PANEL
ANION GAP: 6 (ref 5–15)
BUN: 43 mg/dL — ABNORMAL HIGH (ref 6–20)
CHLORIDE: 111 mmol/L (ref 101–111)
CO2: 23 mmol/L (ref 22–32)
Calcium: 8.7 mg/dL — ABNORMAL LOW (ref 8.9–10.3)
Creatinine, Ser: 2.2 mg/dL — ABNORMAL HIGH (ref 0.44–1.00)
GFR calc non Af Amer: 20 mL/min — ABNORMAL LOW (ref >60)
GFR, EST AFRICAN AMERICAN: 24 mL/min — AB (ref >60)
Glucose, Bld: 102 mg/dL — ABNORMAL HIGH (ref 65–99)
POTASSIUM: 3.9 mmol/L (ref 3.5–5.1)
Sodium: 140 mmol/L (ref 135–145)

## 2016-03-22 LAB — RENAL FUNCTION PANEL
ALBUMIN: 2.6 g/dL — AB (ref 3.5–5.0)
Anion gap: 7 (ref 5–15)
BUN: 43 mg/dL — ABNORMAL HIGH (ref 6–20)
CO2: 23 mmol/L (ref 22–32)
Calcium: 8.7 mg/dL — ABNORMAL LOW (ref 8.9–10.3)
Chloride: 112 mmol/L — ABNORMAL HIGH (ref 101–111)
Creatinine, Ser: 2.2 mg/dL — ABNORMAL HIGH (ref 0.44–1.00)
GFR calc Af Amer: 24 mL/min — ABNORMAL LOW (ref >60)
GFR, EST NON AFRICAN AMERICAN: 20 mL/min — AB (ref >60)
Glucose, Bld: 100 mg/dL — ABNORMAL HIGH (ref 65–99)
PHOSPHORUS: 3.8 mg/dL (ref 2.5–4.6)
POTASSIUM: 4 mmol/L (ref 3.5–5.1)
Sodium: 142 mmol/L (ref 135–145)

## 2016-03-22 LAB — PATHOLOGIST SMEAR REVIEW

## 2016-03-22 LAB — MAGNESIUM: Magnesium: 1.4 mg/dL — ABNORMAL LOW (ref 1.7–2.4)

## 2016-03-22 SURGERY — OPEN REDUCTION INTERNAL FIXATION (ORIF) ELBOW/OLECRANON FRACTURE
Anesthesia: General | Site: Elbow | Laterality: Left

## 2016-03-22 MED ORDER — FENTANYL CITRATE (PF) 100 MCG/2ML IJ SOLN
INTRAMUSCULAR | Status: AC
  Filled 2016-03-22: qty 2

## 2016-03-22 MED ORDER — DEXMEDETOMIDINE HCL 200 MCG/2ML IV SOLN
INTRAVENOUS | Status: DC | PRN
  Administered 2016-03-22 (×2): 40 ug via INTRAVENOUS

## 2016-03-22 MED ORDER — LIDOCAINE HCL 2 % IJ SOLN
INTRAMUSCULAR | Status: AC
  Filled 2016-03-22: qty 20

## 2016-03-22 MED ORDER — BUPIVACAINE HCL (PF) 0.25 % IJ SOLN
INTRAMUSCULAR | Status: AC
  Filled 2016-03-22: qty 30

## 2016-03-22 MED ORDER — METOCLOPRAMIDE HCL 5 MG/ML IJ SOLN: 10.0000 mg | Freq: Once | INTRAMUSCULAR | Status: DC | PRN

## 2016-03-22 MED ORDER — FENTANYL CITRATE (PF) 100 MCG/2ML IJ SOLN
50.0000 ug | Freq: Once | INTRAMUSCULAR | Status: AC
  Administered 2016-03-22 (×2): 100 ug via INTRAVENOUS
  Administered 2016-03-22 (×2): 50 ug via INTRAVENOUS
  Administered 2016-03-22: 100 ug via INTRAVENOUS
  Administered 2016-03-22 (×2): 50 ug via INTRAVENOUS
  Administered 2016-03-22: 100 ug via INTRAVENOUS

## 2016-03-22 MED ORDER — HYDROMORPHONE HCL 1 MG/ML IJ SOLN: 0.2500 mg | INTRAMUSCULAR | Status: DC | PRN

## 2016-03-22 MED ORDER — ROCURONIUM BROMIDE 100 MG/10ML IV SOLN
INTRAVENOUS | Status: DC | PRN
  Administered 2016-03-22: 50 mg via INTRAVENOUS

## 2016-03-22 MED ORDER — SUGAMMADEX SODIUM 200 MG/2ML IV SOLN
INTRAVENOUS | Status: DC | PRN
  Administered 2016-03-22: 200 mg via INTRAVENOUS

## 2016-03-22 MED ORDER — MEPERIDINE HCL 25 MG/ML IJ SOLN: 6.2500 mg | INTRAMUSCULAR | Status: DC | PRN

## 2016-03-22 MED ORDER — SODIUM CHLORIDE 0.9 % IV SOLN
INTRAVENOUS | Status: DC
Start: 2016-03-22 — End: 2016-03-24
  Administered 2016-03-22 (×2): via INTRAVENOUS

## 2016-03-22 MED ORDER — CEFAZOLIN SODIUM 1 G IJ SOLR
INTRAMUSCULAR | Status: DC | PRN
  Administered 2016-03-22: 2 g via INTRAMUSCULAR

## 2016-03-22 MED ORDER — LIDOCAINE HCL (CARDIAC) 20 MG/ML IV SOLN
INTRAVENOUS | Status: DC | PRN
  Administered 2016-03-22: 80 mg via INTRAVENOUS

## 2016-03-22 MED ORDER — 0.9 % SODIUM CHLORIDE (POUR BTL) OPTIME
TOPICAL | Status: DC | PRN
  Administered 2016-03-22: 1000 mL

## 2016-03-22 MED ORDER — PHENYLEPHRINE HCL 10 MG/ML IJ SOLN
INTRAMUSCULAR | Status: DC | PRN
  Administered 2016-03-22: 60 ug via INTRAVENOUS

## 2016-03-22 MED ORDER — PROPOFOL 10 MG/ML IV BOLUS
INTRAVENOUS | Status: DC | PRN
  Administered 2016-03-22: 140 mg via INTRAVENOUS

## 2016-03-22 MED ORDER — CEFAZOLIN IN D5W 1 GM/50ML IV SOLN
1.0000 g | Freq: Three times a day (TID) | INTRAVENOUS | Status: DC
  Filled 2016-03-22 (×2): qty 50

## 2016-03-22 MED ORDER — CEFAZOLIN IN D5W 1 GM/50ML IV SOLN
1.0000 g | Freq: Two times a day (BID) | INTRAVENOUS | Status: AC
  Administered 2016-03-22 – 2016-03-23 (×2): 1 g via INTRAVENOUS
  Filled 2016-03-22 (×2): qty 50

## 2016-03-22 MED ORDER — ONDANSETRON HCL 4 MG/2ML IJ SOLN
4.0000 mg | Freq: Once | INTRAMUSCULAR | Status: AC
  Administered 2016-03-22: 4 mg via INTRAVENOUS
  Filled 2016-03-22 (×2): qty 2

## 2016-03-22 SURGICAL SUPPLY — 99 items
APL SKNCLS STERI-STRIP NONHPOA (GAUZE/BANDAGES/DRESSINGS)
BANDAGE ACE 4X5 VEL STRL LF (GAUZE/BANDAGES/DRESSINGS) ×3 IMPLANT
BANDAGE ACE 6X5 VEL STRL LF (GAUZE/BANDAGES/DRESSINGS) ×3 IMPLANT
BANDAGE ELASTIC 3 VELCRO ST LF (GAUZE/BANDAGES/DRESSINGS) ×3 IMPLANT
BENZOIN TINCTURE PRP APPL 2/3 (GAUZE/BANDAGES/DRESSINGS) IMPLANT
BIT DRILL 2.0 LNG QUCK RELEASE (BIT) IMPLANT
BIT DRILL 2.8 QUICK RELEASE (BIT) IMPLANT
BLADE AVERAGE 25MMX9MM (BLADE) ×1
BLADE AVERAGE 25X9 (BLADE) ×2 IMPLANT
BLADE SURG 10 STRL SS (BLADE) ×2 IMPLANT
BLADE SURG ROTATE 9660 (MISCELLANEOUS) ×1 IMPLANT
BNDG CMPR 9X4 STRL LF SNTH (GAUZE/BANDAGES/DRESSINGS) ×1
BNDG COHESIVE 4X5 TAN STRL (GAUZE/BANDAGES/DRESSINGS) ×1 IMPLANT
BNDG ESMARK 4X9 LF (GAUZE/BANDAGES/DRESSINGS) ×3 IMPLANT
BNDG GAUZE ELAST 4 BULKY (GAUZE/BANDAGES/DRESSINGS) ×3 IMPLANT
BONE CANC CHIPS 20CC PCAN1/4 (Bone Implant) ×3 IMPLANT
BRUSH SCRUB DISP (MISCELLANEOUS) ×4 IMPLANT
CHIPS CANC BONE 20CC PCAN1/4 (Bone Implant) ×1 IMPLANT
CLEANER TIP ELECTROSURG 2X2 (MISCELLANEOUS) ×3 IMPLANT
CLOSURE WOUND 1/2 X4 (GAUZE/BANDAGES/DRESSINGS)
COVER SURGICAL LIGHT HANDLE (MISCELLANEOUS) ×6 IMPLANT
CUFF TOURNIQUET SINGLE 18IN (TOURNIQUET CUFF) IMPLANT
CUFF TOURNIQUET SINGLE 24IN (TOURNIQUET CUFF) ×2 IMPLANT
DECANTER SPIKE VIAL GLASS SM (MISCELLANEOUS) ×2 IMPLANT
DRAPE C-ARM 42X72 X-RAY (DRAPES) ×2 IMPLANT
DRAPE C-ARMOR (DRAPES) ×3 IMPLANT
DRAPE INCISE IOBAN 66X45 STRL (DRAPES) IMPLANT
DRAPE U-SHAPE 47X51 STRL (DRAPES) ×3 IMPLANT
DRILL 2.0 LNG QUICK RELEASE (BIT) ×3
DRILL 2.8 QUICK RELEASE (BIT) ×3
DRSG ADAPTIC 3X8 NADH LF (GAUZE/BANDAGES/DRESSINGS) IMPLANT
DRSG EMULSION OIL 3X3 NADH (GAUZE/BANDAGES/DRESSINGS) IMPLANT
DRSG MEPILEX BORDER 4X8 (GAUZE/BANDAGES/DRESSINGS) ×2 IMPLANT
DRSG PAD ABDOMINAL 8X10 ST (GAUZE/BANDAGES/DRESSINGS) ×2 IMPLANT
ELECT REM PT RETURN 9FT ADLT (ELECTROSURGICAL) ×3
ELECTRODE REM PT RTRN 9FT ADLT (ELECTROSURGICAL) ×1 IMPLANT
FACESHIELD WRAPAROUND (MASK) ×3 IMPLANT
FACESHIELD WRAPAROUND OR TEAM (MASK) IMPLANT
GAUZE SPONGE 4X4 12PLY STRL (GAUZE/BANDAGES/DRESSINGS) ×3 IMPLANT
GAUZE XEROFORM 1X8 LF (GAUZE/BANDAGES/DRESSINGS) ×1 IMPLANT
GAUZE XEROFORM 5X9 LF (GAUZE/BANDAGES/DRESSINGS) ×3 IMPLANT
GLOVE BIO SURGEON STRL SZ7.5 (GLOVE) ×3 IMPLANT
GLOVE BIO SURGEON STRL SZ8 (GLOVE) ×5 IMPLANT
GLOVE BIOGEL PI IND STRL 7.5 (GLOVE) ×1 IMPLANT
GLOVE BIOGEL PI IND STRL 8 (GLOVE) ×1 IMPLANT
GLOVE BIOGEL PI INDICATOR 7.5 (GLOVE) ×2
GLOVE BIOGEL PI INDICATOR 8 (GLOVE) ×4
GOWN STRL REUS W/ TWL LRG LVL3 (GOWN DISPOSABLE) ×2 IMPLANT
GOWN STRL REUS W/ TWL XL LVL3 (GOWN DISPOSABLE) ×1 IMPLANT
GOWN STRL REUS W/TWL LRG LVL3 (GOWN DISPOSABLE) ×6
GOWN STRL REUS W/TWL XL LVL3 (GOWN DISPOSABLE) ×9
GRAFT BNE CANC CHIPS 1-8 20CC (Bone Implant) IMPLANT
GUIDEWIRE ORTH 6X062XTROC NS (WIRE) IMPLANT
K-WIRE .062 (WIRE) ×9
KIT BASIN OR (CUSTOM PROCEDURE TRAY) ×3 IMPLANT
KIT ROOM TURNOVER OR (KITS) ×3 IMPLANT
MANIFOLD NEPTUNE II (INSTRUMENTS) ×1 IMPLANT
NS IRRIG 1000ML POUR BTL (IV SOLUTION) ×3 IMPLANT
PACK ORTHO EXTREMITY (CUSTOM PROCEDURE TRAY) ×3 IMPLANT
PAD ARMBOARD 7.5X6 YLW CONV (MISCELLANEOUS) ×6 IMPLANT
PAD CAST 3X4 CTTN HI CHSV (CAST SUPPLIES) IMPLANT
PAD CAST 4YDX4 CTTN HI CHSV (CAST SUPPLIES) ×1 IMPLANT
PADDING CAST COTTON 3X4 STRL (CAST SUPPLIES) ×3
PADDING CAST COTTON 4X4 STRL (CAST SUPPLIES) ×3
PLATE OLECRANON 5HOLE LEFT STD (Plate) ×2 IMPLANT
SCREW HEX LOCK 2.7X14MM (Screw) ×2 IMPLANT
SCREW HEX LOCK 2.7X16MM (Screw) ×2 IMPLANT
SCREW HEXALOBE LOCKING 3.5X14M (Screw) ×4 IMPLANT
SCREW HEXALOBE NON-LOCK 3.5X14 (Screw) ×2 IMPLANT
SCREW LOCK 18X2.7X HEXALOBE (Screw) IMPLANT
SCREW LOCK 40X3.5X HEXALOBE (Screw) IMPLANT
SCREW LOCKING 2.7X18MM (Screw) ×6 IMPLANT
SCREW LOCKING 3.5X40 (Screw) ×3 IMPLANT
SPONGE GAUZE 4X4 12PLY STER LF (GAUZE/BANDAGES/DRESSINGS) ×2 IMPLANT
SPONGE LAP 18X18 X RAY DECT (DISPOSABLE) ×2 IMPLANT
SPONGE SCRUB IODOPHOR (GAUZE/BANDAGES/DRESSINGS) ×1 IMPLANT
STAPLER VISISTAT 35W (STAPLE) ×3 IMPLANT
STOCKINETTE IMPERVIOUS 9X36 MD (GAUZE/BANDAGES/DRESSINGS) ×2 IMPLANT
STRIP CLOSURE SKIN 1/2X4 (GAUZE/BANDAGES/DRESSINGS) IMPLANT
SUCTION FRAZIER HANDLE 10FR (MISCELLANEOUS) ×2
SUCTION TUBE FRAZIER 10FR DISP (MISCELLANEOUS) ×1 IMPLANT
SUT ETHILON 3 0 PS 1 (SUTURE) ×4 IMPLANT
SUT PDS AB 2-0 CT1 27 (SUTURE) IMPLANT
SUT PROLENE 3 0 PS 2 (SUTURE) ×4 IMPLANT
SUT VIC AB 0 CT1 27 (SUTURE) ×6
SUT VIC AB 0 CT1 27XBRD ANBCTR (SUTURE) ×2 IMPLANT
SUT VIC AB 1 CT1 27 (SUTURE) ×3
SUT VIC AB 1 CT1 27XBRD ANBCTR (SUTURE) IMPLANT
SUT VIC AB 2-0 CT1 27 (SUTURE) ×9
SUT VIC AB 2-0 CT1 TAPERPNT 27 (SUTURE) ×2 IMPLANT
SUT VIC AB 2-0 CT3 27 (SUTURE) IMPLANT
SYR CONTROL 10ML LL (SYRINGE) ×3 IMPLANT
TOWEL OR 17X24 6PK STRL BLUE (TOWEL DISPOSABLE) ×2 IMPLANT
TOWEL OR 17X26 10 PK STRL BLUE (TOWEL DISPOSABLE) ×6 IMPLANT
TUBE CONNECTING 12'X1/4 (SUCTIONS) ×1
TUBE CONNECTING 12X1/4 (SUCTIONS) ×2 IMPLANT
UNDERPAD 30X30 (UNDERPADS AND DIAPERS) ×3 IMPLANT
WATER STERILE IRR 1000ML POUR (IV SOLUTION) ×3 IMPLANT
YANKAUER SUCT BULB TIP NO VENT (SUCTIONS) ×3 IMPLANT

## 2016-03-22 NOTE — Anesthesia Procedure Notes (Signed)
Procedure Name: Intubation Date/Time: 03/22/2016 1:57 PM Performed by: Valda Favia Pre-anesthesia Checklist: Patient identified, Emergency Drugs available, Suction available, Patient being monitored and Timeout performed Patient Re-evaluated:Patient Re-evaluated prior to inductionOxygen Delivery Method: Circle system utilized Preoxygenation: Pre-oxygenation with 100% oxygen Intubation Type: IV induction Ventilation: Mask ventilation without difficulty Laryngoscope Size: Mac and 4 Grade View: Grade I Tube type: Oral Tube size: 7.0 mm Number of attempts: 1 Airway Equipment and Method: Stylet Placement Confirmation: ETT inserted through vocal cords under direct vision,  positive ETCO2 and breath sounds checked- equal and bilateral Secured at: 21 cm Tube secured with: Tape Dental Injury: Teeth and Oropharynx as per pre-operative assessment

## 2016-03-22 NOTE — Progress Notes (Signed)
PROGRESS NOTE    Shirley [name]  VQM:[id number] DOB: 06-07-1940 DOA: 03/17/2016 PCP: Ardith Dark, PA-C   Brief Narrative: 76 y.o.femalewith history of chronic pain and arthritis was recently placed on indomethacin  was brought to the ER after patient was found to be confused and had a fall. Workup significant for acute renal failure with a creatinine of 6.5, with anion gap of 18, possible UTI, CT with possible subacute occipital infarct, but no evidence of acute infarct on MRI brain,she has left elbow olecranon fracture, seen by orthopedic with the plan for surgery on 03/22/2016.  Assessment & Plan:   # Acute renal failure: Most likely related with NSAIDs use. UA unremarkable. Ultrasound kidneys consistent with possible chronic medical renal disease. Patient has normal serum creatinine level at baseline, had serum creatinine level of 6.5 on admission. Patient was treated with IV fluid and evaluated by nephrologist. -Serum creatinine level is stable at 2.2 today. -Monitor BMP, urine output. Avoid nephrotoxins. Possibly has follow-up with nephrologist as an outpatient.  # Acute encephalopathy: Multifactorial etiology including metabolic encephalopathy in the setting of electrolyte imbalance, renal failure and possibility pain medications. Patient's mental status is back to baseline. She has no focal neurological deficit. - CT showing possible subacute infarct,But MRI brain with no acute findings. -On renal dose of Neurontin.  # UTI, asymptomatic bacteriuria - no pyuria but significant bacteriuria , urine cultures aren't helpful as growing multiple species, treated with 3 days of Rocephin .  # SVT - recent echo done with EF 65%, no regional wall motion abnormalities,  -Prior MD discussed with cardiology Dr. Oval Linsey, she'll need outpatient follow-up with cardiology, and to start on beta blockers if blood pressure allows,  -On metoprolol 25 mg twice a day  # Hypertension - Blood  pressure is suboptimally controlled. Patient is currently on metoprolol twice a day. I will not start new medications since she is going to or today and will get anesthesia. On hydralazine as needed for uncontrolled hypertension.  # Chronic pain syndrome and arthritis - Pain medications reduced. Continue PT OT evaluation and treatment.  # Left olecranon fracture - orthopedic consult appreciated, plan for surgical repair today.  # Chronic anemia, iron deficiency anemia.  - Workup significant for low iron and B-12 level, continue with B-12 supplement, received IV iron by renal, Hemoccult negative, will need iron deficiency anemia as outpatient. -Hemoglobin is stable today.  Principal Problem:   Acute renal failure (HCC) Active Problems:   Chronic pain syndrome   Acute encephalopathy   Elbow fracture, left, closed, initial encounter  DVT prophylaxis: Heparin subcutaneous Code Status: Full code Family Communication: No family present at bedside Disposition Plan: Likely discharge home versus rehabilitation in 1-2 days.    Consultants:   Orthopedics  Nephrologist  Procedures: Or today Antimicrobials: Received 2 days of antibiotics  Subjective: Patient was seen and examined at bedside. Patient was sitting on chair. She was alert awake and oriented. Denied pain. No headache, dizziness, chest pain, shortness of breath.  Objective: Vitals:   03/21/16 2131 03/22/16 0407 03/22/16 0746 03/22/16 0859  BP: (!) 166/61 (!) 166/90 (!) 195/89 (!) 172/82  Pulse: 87 66 76   Resp:  12 14   Temp: 97.9 F (36.6 C) 98.5 F (36.9 C) 98.1 F (36.7 C)   TempSrc: Oral Oral Oral   SpO2: 94% 97% 96%   Weight:        Intake/Output Summary (Last 24 hours) at 03/22/16 1357 Last data filed at 03/22/16 0400  Gross per 24 hour  Intake              600 ml  Output              350 ml  Net              250 ml   Filed Weights   03/19/16 0500 03/20/16 0237 03/21/16 0418  Weight: 79.7 kg (175  lb 11.3 oz) 81.4 kg (179 lb 7.3 oz) 79.6 kg (175 lb 7.8 oz)    Examination:  General exam: Appears calm and comfortable  Respiratory system: Clear to auscultation. Respiratory effort normal. No wheezing or crackle Cardiovascular system: S1 & S2 heard, RRR.  No pedal edema. Gastrointestinal system: Abdomen is nondistended, soft and nontender. Normal bowel sounds heard. Central nervous system: Alert and oriented. No focal neurological deficits. Extremities: Left upper extremity has bandages and sling. No lower extremity edema. Skin: No rashes, lesions or ulcers Psychiatry: Judgement and insight appear normal. Mood & affect appropriate.     Data Reviewed: I have personally reviewed following labs and imaging studies  CBC:  Recent Labs Lab 03/18/16 0731 03/19/16 0305 03/20/16 0210 03/21/16 0258 03/22/16 0402  WBC 7.4 6.7 8.1 8.7 8.6  HGB 9.2* 8.8* 9.3* 10.3* 10.1*  HCT 29.0* 28.2* 29.9* 33.1* 32.0*  MCV 87.1 87.3 87.4 87.8 87.0  PLT 793* 763* 866* 860* 1,751*   Basic Metabolic Panel:  Recent Labs Lab 03/18/16 0600 03/18/16 0731 03/19/16 0305 03/20/16 0210 03/21/16 0258 03/22/16 0402  NA 136  --  138 138 138 142  140  K 4.3  --  4.2 4.1 5.1 4.0  3.9  CL 104  --  109 110 110 112*  111  CO2 16*  --  19* 19* 19* 23  23  GLUCOSE 99  --  90 107* 156* 100*  102*  BUN 79*  --  74* 64* 55* 43*  43*  CREATININE 6.05* 6.06* 5.03* 4.04* 2.94* 2.20*  2.20*  CALCIUM 7.9*  --  7.8* 8.0* 8.6* 8.7*  8.7*  MG  --   --   --   --   --  1.4*  PHOS  --   --   --   --   --  3.8   GFR: Estimated Creatinine Clearance: 21.9 mL/min (by C-G formula based on SCr of 2.2 mg/dL (H)). Liver Function Tests:  Recent Labs Lab 03/17/16 2205 03/18/16 0600 03/22/16 0402  AST 25 18  --   ALT 14 13*  --   ALKPHOS 80 63  --   BILITOT 0.5 0.9  --   PROT 7.1 5.4*  --   ALBUMIN 4.2 3.1* 2.6*   No results for input(s): LIPASE, AMYLASE in the last 168 hours.  Recent Labs Lab  03/18/16 0731  AMMONIA 26   Coagulation Profile: No results for input(s): INR, PROTIME in the last 168 hours. Cardiac Enzymes:  Recent Labs Lab 03/18/16 0600  CKTOTAL 258*   BNP (last 3 results) No results for input(s): PROBNP in the last 8760 hours. HbA1C: No results for input(s): HGBA1C in the last 72 hours. CBG:  Recent Labs Lab 03/22/16 1154  GLUCAP 118*   Lipid Profile: No results for input(s): CHOL, HDL, LDLCALC, TRIG, CHOLHDL, LDLDIRECT in the last 72 hours. Thyroid Function Tests: No results for input(s): TSH, T4TOTAL, FREET4, T3FREE, THYROIDAB in the last 72 hours. Anemia Panel: No results for input(s): VITAMINB12, FOLATE, FERRITIN, TIBC, IRON, RETICCTPCT in the last 72 hours.  Sepsis Labs:  Recent Labs Lab 03/17/16 2214  LATICACIDVEN 1.22    Recent Results (from the past 240 hour(s))  Urine culture     Status: Abnormal   Collection Time: 03/18/16 12:52 AM  Result Value Ref Range Status   Specimen Description URINE, RANDOM  Final   Special Requests NONE  Final   Culture MULTIPLE SPECIES PRESENT, SUGGEST RECOLLECTION (A)  Final   Report Status 03/19/2016 FINAL  Final  MRSA PCR Screening     Status: None   Collection Time: 03/20/16 12:23 PM  Result Value Ref Range Status   MRSA by PCR NEGATIVE NEGATIVE Final    Comment:        The GeneXpert MRSA Assay (FDA approved for NASAL specimens only), is one component of a comprehensive MRSA colonization surveillance program. It is not intended to diagnose MRSA infection nor to guide or monitor treatment for MRSA infections.          Radiology Studies: No results found.      Scheduled Meds: . [MAR Hold] aspirin EC  81 mg Oral Daily  . [MAR Hold] docusate sodium  100 mg Oral Daily  . fentaNYL      . fentaNYL (SUBLIMAZE) injection  50-100 mcg Intravenous Once  . [MAR Hold] gabapentin  300 mg Oral QHS  . [MAR Hold] heparin  5,000 Units Subcutaneous Q8H  . [MAR Hold] loratadine  10 mg Oral  Daily  . [facility] Hold] metoprolol tartrate  25 mg Oral BID   Continuous Infusions: . sodium chloride 10 mL/hr at 03/22/16 1206     LOS: 4 days    Dron Tanna Furry, MD [facility] Pager [phone]  If 7PM-7AM, please contact night-coverage [email or web address] Password [email or web address] 03/22/2016, 1:57 PM

## 2016-03-22 NOTE — Progress Notes (Signed)
Orthopaedic Trauma Service Progress Note  Subjective  Doing ok Pain tolerable Nervous about surgery today   ROS As above  Objective   BP (!) 172/82   Pulse 76   Temp 98.1 F (36.7 C) (Oral)   Resp 14   Wt 79.6 kg (175 lb 7.8 oz)   SpO2 96%   BMI 30.12 kg/m   Intake/Output      01/28 0701 - 01/29 0700 01/29 0701 - 01/30 0700   P.O. 600    Total Intake(mL/kg) 600 (7.5)    Urine (mL/kg/hr) 350 (0.2)    Stool     Total Output 350     Net +250          Urine Occurrence 3 x 1 x   Stool Occurrence 2 x      Labs  Results for NIRVI, BOEHLER (MRN [id number]) as of 03/22/2016 10:41  Ref. Range 03/22/2016 04:02  Potassium Latest Ref Range: 3.5 - 5.1 mmol/L 3.9  Chloride Latest Ref Range: 101 - 111 mmol/L 111  CO2 Latest Ref Range: 22 - 32 mmol/L 23  Glucose Latest Ref Range: 65 - 99 mg/dL 102 (H)  BUN Latest Ref Range: 6 - 20 mg/dL 43 (H)  Creatinine Latest Ref Range: 0.44 - 1.00 mg/dL 2.20 (H)  Calcium Latest Ref Range: 8.9 - 10.3 mg/dL 8.7 (L)  Anion gap Latest Ref Range: 5 - 15  6  Phosphorus Latest Ref Range: 2.5 - 4.6 mg/dL   Magnesium Latest Ref Range: 1.7 - 2.4 mg/dL 1.4 (L)  Albumin Latest Ref Range: 3.5 - 5.0 g/dL   EGFR (African American) Latest Ref Range: >60 mL/min 24 (L)  EGFR (Non-African Amer.) Latest Ref Range: >60 mL/min 20 (L)  WBC Latest Ref Range: 4.0 - 10.5 K/uL 8.6  RBC Latest Ref Range: 3.87 - 5.11 MIL/uL 3.68 (L)  Hemoglobin Latest Ref Range: 12.0 - 15.0 g/dL 10.1 (L)  HCT Latest Ref Range: 36.0 - 46.0 % 32.0 (L)  MCV Latest Ref Range: 78.0 - 100.0 fL 87.0  MCH Latest Ref Range: 26.0 - 34.0 pg 27.4  MCHC Latest Ref Range: 30.0 - 36.0 g/dL 31.6  RDW Latest Ref Range: 11.5 - 15.5 % 15.1  Platelets Latest Ref Range: 150 - 400 K/uL 1,055 [facility])   Results for ADELYN, ROSCHER (MRN [id number]) as of 03/22/2016 10:41  Ref. Range 03/18/2016 06:00  Total Protein ELP Latest Ref Range: 6.0 - 8.5 g/dL 5.4 (L)  Albumin ELP Latest Ref Range: 2.9 - 4.4 g/dL 3.1   Globulin, Total Latest Ref Range: 2.2 - 3.9 g/dL 2.3  A/G Ratio Latest Ref Range: 0.7 - 1.7  1.3  Alpha-1-Globulin Latest Ref Range: 0.0 - 0.4 g/dL 0.3  Alpha-2-Globulin Latest Ref Range: 0.4 - 1.0 g/dL 0.7  Beta Globulin Latest Ref Range: 0.7 - 1.3 g/dL 0.7  Gamma Globulin Latest Ref Range: 0.4 - 1.8 g/dL 0.6   (NOTE)  The SPE pattern appears essentially unremarkable. Evidence of  monoclonal protein is not apparent.    Immunofixation, Urine  Immunofixation, urine  Collected: 03/18/16 0052  Resulting lab: SUNQUEST  Value: Comment  Comment: (NOTE)  An apparent normal immunofixation pattern.  Performed At: [facility]  [place], [place] [id number]  Lindon Romp MD YQ:[id number]     Exam  Gen: awake and alert , sitting in bedside chair  Lungs: breathing unlabored Ext:       Left Upper Extremity   Splint fitting well  Ext warm  Brisk cap refill    R/U/M motor functions intact  R/U/M sensation intact   Swelling improved to L hand    Assessment and Plan   POD/HD#: 39  76 y/o female with comminuted L olecranon fracture, UTI, chronic pain    -comminuted L olecranon fracture             OR today    Will check with medical service    Renal function improving   plt count continues to rise    Will discuss with anesthesia as well   - Pain management:             continue per medical service    - Medical issues              Acute renal failure                         Per medical service    - DVT/PE prophylaxis:             Per medical service             Does not require based off ortho injury    - ID:              completed rocephin    - Dispo:             probable OR today for ORIF L olecranon     Jari Pigg, PA-C [facility] [phone] ([phone] (O) 03/22/2016 10:41 AM

## 2016-03-22 NOTE — Anesthesia Preprocedure Evaluation (Addendum)
Anesthesia Evaluation  Patient identified by MRN, date of birth, ID band Patient awake    Reviewed: Allergy & Precautions, NPO status , Patient's Chart, lab work & pertinent test results  Airway Mallampati: II  TM Distance: >3 FB Neck ROM: Full    Dental no notable dental hx. (+) Teeth Intact   Pulmonary shortness of breath and with exertion, asthma , Current Smoker,    Pulmonary exam normal breath sounds clear to auscultation       Cardiovascular hypertension, Pt. 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 Etiology    Anesthesia Other Findings   Reproductive/Obstetrics                              Chemistry      Component Value Date/Time   NA 142 03/22/2016 0402   NA 140 03/22/2016 0402   K 4.0 03/22/2016 0402   K 3.9 03/22/2016 0402   CL 112 (H) 03/22/2016 0402   CL 111 03/22/2016 0402   CO2 23 03/22/2016 0402   CO2 23 03/22/2016 0402   BUN 43 (H) 03/22/2016 0402   BUN 43 (H) 03/22/2016 0402   CREATININE 2.20 (H) 03/22/2016 0402   CREATININE 2.20 (H) 03/22/2016 0402   CREATININE 0.66 07/19/2012 1651      Component Value Date/Time   CALCIUM 8.7 (L) 03/22/2016 0402   CALCIUM 8.7 (L) 03/22/2016 0402   ALKPHOS 63 03/18/2016 0600   AST 18 03/18/2016 0600   ALT 13 (L) 03/18/2016 0600   BILITOT 0.9 03/18/2016 0600     Lab Results   Component Value Date   WBC 8.6 03/22/2016   HGB 10.1 (L) 03/22/2016   HCT 32.0 (L) 03/22/2016   MCV 87.0 03/22/2016   PLT 1,055 (HH) 03/22/2016   EKG: normal sinus rhythm with sinus arrhythmia.  Anesthesia Physical Anesthesia Plan  ASA: III  Anesthesia Plan: General   Post-op Pain Management:    Induction: Intravenous  Airway Management Planned: Oral ETT  Additional Equipment:   Intra-op Plan:   Post-operative Plan: Extubation in OR  Informed Consent: I have reviewed the patients History and Physical, chart, labs and discussed the procedure including the risks, benefits and alternatives for the proposed anesthesia with the patient or authorized representative who has indicated his/her understanding and acceptance.   Dental advisory given  Plan Discussed with: Anesthesiologist, CRNA and Surgeon  Anesthesia Plan Comments:         Anesthesia Quick Evaluation

## 2016-03-22 NOTE — Progress Notes (Signed)
PHARMACY NOTE:  ANTIMICROBIAL RENAL DOSAGE ADJUSTMENT  Current antimicrobial regimen includes a mismatch between antimicrobial dosage and estimated renal function.  As per policy approved by the Pharmacy & Therapeutics and Medical Executive Committees, the antimicrobial dosage will be adjusted accordingly.  Current antimicrobial dosage:  Cefazolin 1 gram IV every 8 hours   Indication: post op surgical prophylaxis with stop date    Renal Function:  Estimated Creatinine Clearance: 21.9 mL/min (by C-G formula based on SCr of 2.2 mg/dL (H)). []      On intermittent HD, scheduled: []      On CRRT    Antimicrobial dosage has been changed to:  Ancef 1 gram IV every 12 hours   Additional comments:  Determine length of therapy    Thank you for allowing pharmacy to be a part of this patient's care.  Duayne Cal, [facility] 03/22/2016 8:41 PM

## 2016-03-22 NOTE — Transfer of Care (Signed)
Immediate Anesthesia Transfer of Care Note  Patient: Shirley [name]  Procedure(s) Performed: Procedure(s): OPEN REDUCTION INTERNAL FIXATION (ORIF) ELBOW/OLECRANON FRACTURE (Left)  Patient Location: PACU  Anesthesia Type:General  Level of Consciousness: sedated and responds to stimulation  Airway & Oxygen Therapy: Patient Spontanous Breathing and Patient connected to nasal cannula oxygen  Post-op Assessment: Report given to RN, Post -op Vital signs reviewed and stable and Patient moving all extremities  Post vital signs: Reviewed and stable  Last Vitals:  Vitals:   03/22/16 0859 03/22/16 1607  BP: (!) 172/82 (!) 90/48  Pulse:  60  Resp:  16  Temp:  36.7 C    Last Pain:  Vitals:   03/22/16 1607  TempSrc:   PainSc: (P) Asleep      Patients Stated Pain Goal: 0 (34/37/35 7897)  Complications: No apparent anesthesia complications

## 2016-03-22 NOTE — Care Management Important Message (Signed)
Important Message  Patient Details  Name: Shirley [name] MRN: [id number] Date of Birth: 1941-01-02   Medicare Important Message Given:  Yes    Nathen May 03/22/2016, 2:07 PM

## 2016-03-22 NOTE — Progress Notes (Signed)
Patient reports that nausea is better.

## 2016-03-22 NOTE — Progress Notes (Signed)
Patient reports being nauseated. Dr. Royce Macadamia notified orders received.

## 2016-03-22 NOTE — Progress Notes (Signed)
Physical medicine rehabilitation consult requested chart reviewed. Patient with plan for surgical intervention 03/22/2016 for left olecranon fracture. Will follow-up postoperatively and await physical and occupational therapy evaluations.

## 2016-03-23 ENCOUNTER — Encounter (HOSPITAL_COMMUNITY): Payer: Self-pay | Admitting: Orthopedic Surgery

## 2016-03-23 DIAGNOSIS — D62 Acute posthemorrhagic anemia: Secondary | ICD-10-CM

## 2016-03-23 DIAGNOSIS — W19XXXA Unspecified fall, initial encounter: Secondary | ICD-10-CM

## 2016-03-23 DIAGNOSIS — I158 Other secondary hypertension: Secondary | ICD-10-CM

## 2016-03-23 DIAGNOSIS — T148XXA Other injury of unspecified body region, initial encounter: Secondary | ICD-10-CM

## 2016-03-23 DIAGNOSIS — D649 Anemia, unspecified: Secondary | ICD-10-CM

## 2016-03-23 DIAGNOSIS — G8918 Other acute postprocedural pain: Secondary | ICD-10-CM

## 2016-03-23 DIAGNOSIS — N179 Acute kidney failure, unspecified: Principal | ICD-10-CM

## 2016-03-23 DIAGNOSIS — D72829 Elevated white blood cell count, unspecified: Secondary | ICD-10-CM

## 2016-03-23 DIAGNOSIS — D473 Essential (hemorrhagic) thrombocythemia: Secondary | ICD-10-CM

## 2016-03-23 DIAGNOSIS — W19XXXD Unspecified fall, subsequent encounter: Secondary | ICD-10-CM

## 2016-03-23 DIAGNOSIS — G894 Chronic pain syndrome: Secondary | ICD-10-CM

## 2016-03-23 DIAGNOSIS — M797 Fibromyalgia: Secondary | ICD-10-CM

## 2016-03-23 DIAGNOSIS — D75839 Thrombocytosis, unspecified: Secondary | ICD-10-CM | POA: Diagnosis present

## 2016-03-23 DIAGNOSIS — Z720 Tobacco use: Secondary | ICD-10-CM

## 2016-03-23 DIAGNOSIS — R739 Hyperglycemia, unspecified: Secondary | ICD-10-CM

## 2016-03-23 DIAGNOSIS — J45909 Unspecified asthma, uncomplicated: Secondary | ICD-10-CM

## 2016-03-23 LAB — RENAL FUNCTION PANEL
ALBUMIN: 2.7 g/dL — AB (ref 3.5–5.0)
Anion gap: 8 (ref 5–15)
BUN: 37 mg/dL — ABNORMAL HIGH (ref 6–20)
CALCIUM: 8.4 mg/dL — AB (ref 8.9–10.3)
CO2: 22 mmol/L (ref 22–32)
CREATININE: 1.9 mg/dL — AB (ref 0.44–1.00)
Chloride: 108 mmol/L (ref 101–111)
GFR calc non Af Amer: 24 mL/min — ABNORMAL LOW (ref >60)
GFR, EST AFRICAN AMERICAN: 28 mL/min — AB (ref >60)
GLUCOSE: 184 mg/dL — AB (ref 65–99)
PHOSPHORUS: 3.7 mg/dL (ref 2.5–4.6)
Potassium: 3.7 mmol/L (ref 3.5–5.1)
SODIUM: 138 mmol/L (ref 135–145)

## 2016-03-23 LAB — C-REACTIVE PROTEIN: CRP: 6.2 mg/dL — AB (ref <1.0)

## 2016-03-23 LAB — CBC
HCT: 32.7 % — ABNORMAL LOW (ref 36.0–46.0)
Hemoglobin: 9.9 g/dL — ABNORMAL LOW (ref 12.0–15.0)
MCH: 26.9 pg (ref 26.0–34.0)
MCHC: 30.3 g/dL (ref 30.0–36.0)
MCV: 88.9 fL (ref 78.0–100.0)
PLATELETS: 1242 10*3/uL — AB (ref 150–400)
RBC: 3.68 MIL/uL — AB (ref 3.87–5.11)
RDW: 15.2 % (ref 11.5–15.5)
WBC: 11.6 10*3/uL — ABNORMAL HIGH (ref 4.0–10.5)

## 2016-03-23 LAB — LACTATE DEHYDROGENASE: LDH: 207 U/L — ABNORMAL HIGH (ref 98–192)

## 2016-03-23 LAB — SEDIMENTATION RATE: Sed Rate: 58 mm/hr — ABNORMAL HIGH (ref 0–22)

## 2016-03-23 MED ORDER — MORPHINE SULFATE (PF) 2 MG/ML IV SOLN
2.0000 mg | INTRAVENOUS | Status: DC | PRN
  Administered 2016-03-23 (×2): 2 mg via INTRAVENOUS
  Filled 2016-03-23 (×2): qty 1

## 2016-03-23 MED ORDER — OXYCODONE HCL 5 MG PO TABS
10.0000 mg | ORAL_TABLET | Freq: Four times a day (QID) | ORAL | Status: DC | PRN
  Administered 2016-03-23: 15 mg via ORAL
  Administered 2016-03-23 – 2016-03-24 (×4): 20 mg via ORAL
  Filled 2016-03-23 (×2): qty 4
  Filled 2016-03-23: qty 3
  Filled 2016-03-23 (×3): qty 4

## 2016-03-23 MED ORDER — POLYETHYLENE GLYCOL 3350 17 G PO PACK
17.0000 g | PACK | Freq: Every day | ORAL | Status: DC | PRN
  Administered 2016-03-24: 17 g via ORAL
  Filled 2016-03-23 (×2): qty 1

## 2016-03-23 NOTE — Progress Notes (Signed)
Orthopaedic Trauma Service Progress Note  Subjective  Had a rough night Minimal sleep  Mild headache   Pt with thrombocytosis  Denies any history of issues with platelets  Reports no history of abnormal bleeding  Pt still has spleen   She is on heparin for DVT prophylaxis   Review of Systems  Respiratory: Negative for shortness of breath.   Cardiovascular: Negative for chest pain and palpitations.  Gastrointestinal: Negative for vomiting.  Neurological: Positive for headaches.    Objective  BP (!) 146/68 (BP Location: Right Arm)   Pulse 67   Temp 98.1 F (36.7 C) (Oral)   Resp 18   Wt 81.8 kg (180 lb 5.4 oz)   SpO2 98%   BMI 30.95 kg/m   Intake/Output      01/29 0701 - 01/30 0700 01/30 0701 - 01/31 0700   P.O. 620    I.V. (mL/kg) 1010 (12.3)    IV Piggyback 50    Total Intake(mL/kg) 1680 (20.5)    Urine (mL/kg/hr) 800 (0.4)    Blood 100 (0.1)    Total Output 900     Net +780          Urine Occurrence 2 x      Labs  Results for Shirley, [name] (MRN [id number]) as of 03/23/2016 09:06  Ref. Range 03/23/2016 03:57  WBC Latest Ref Range: 4.0 - 10.5 K/uL 11.6 (H)  RBC Latest Ref Range: 3.87 - 5.11 MIL/uL 3.68 (L)  Hemoglobin Latest Ref Range: 12.0 - 15.0 g/dL 9.9 (L)  HCT Latest Ref Range: 36.0 - 46.0 % 32.7 (L)  MCV Latest Ref Range: 78.0 - 100.0 fL 88.9  MCH Latest Ref Range: 26.0 - 34.0 pg 26.9  MCHC Latest Ref Range: 30.0 - 36.0 g/dL 30.3  RDW Latest Ref Range: 11.5 - 15.5 % 15.2  Platelets Latest Ref Range: 150 - 400 K/uL 1,242 [facility])   Results for Shirley, [name] (MRN [id number]) as of 03/23/2016 09:06  Ref. Range 03/23/2016 03:57  Potassium Latest Ref Range: 3.5 - 5.1 mmol/L 3.7  Chloride Latest Ref Range: 101 - 111 mmol/L 108  CO2 Latest Ref Range: 22 - 32 mmol/L 22  Glucose Latest Ref Range: 65 - 99 mg/dL 184 (H)  BUN Latest Ref Range: 6 - 20 mg/dL 37 (H)  Creatinine Latest Ref Range: 0.44 - 1.00 mg/dL 1.90 (H)  Calcium Latest Ref Range: 8.9 - 10.3  mg/dL 8.4 (L)  Anion gap Latest Ref Range: 5 - 15  8  Phosphorus Latest Ref Range: 2.5 - 4.6 mg/dL 3.7  Albumin Latest Ref Range: 3.5 - 5.0 g/dL 2.7 (L)  EGFR (African American) Latest Ref Range: >60 mL/min 28 (L)  EGFR (Non-African Amer.) Latest Ref Range: >60 mL/min 24 (L)   Exam  Gen: resting comfortably in bed, NAD Lungs: clear anterior fields Cardiac: irregular  Abd: + BS, NTND Ext:       Left Upper Extremity   Splint c/d/i  Mod swelling to hand but not atypical   R/U/M motor and sensory functions intact  Ext warm     Assessment and Plan   POD/HD#: 1   76 y/o female with comminuted L olecranon fracture, UTI, chronic pain    -comminuted L olecranon fracture s/p ORIF             NWB L UEx  Ice and elevate  Finger motion and wrist motion ok   Sling    PT/OT  Will be restricted from active extension  for 8 weeks  Remain in splint for 2 weeks then begin protected ROM     - Pain management:             high tolerance given long term pain management   Oxy IR 10-20 mg po q6h prn pain   Morphine 2 mg IV q2h prn severe breakthrough pain     - Medical issues              Acute renal failure                       improving    Thrombocytosis    Platelets continue to rise   Discuss with medicine. Will need Hematology eval   ? If related to medications, chronic iron deficiency, chronic inflammation    Labs PTA also show mild thrombocytosis    - DVT/PE prophylaxis:             Per medical service             Does not require based off ortho injury    - ID:              completed rocephin for UTI    - Dispo:            PT/OT evals   Ortho issues stable    Continue per medical service     Jari Pigg, PA-C [facility] [phone] (O) 03/23/2016 8:57 AM

## 2016-03-23 NOTE — Progress Notes (Signed)
Pharmacy consult: medication induced thrombocytosis  76 yo female her with fall and confusion and noted with AKI, acute encephalopathy and L elbow fracture s/p OR on 1/30. Patient noted with thrombocytosis and pharmacy consulted to look into medications that may be a cause -platelet count noted as 870 on 1/24 (admit date) and 1242 today -Platelet count noted as 614 in 04/2006 at [facility] medication and current medications were reviewed. Literature reports for medication induced thrombocytosis are at an a minimum. This reaction has been noted with vinca alkaloids, gemcitabine, glucocorticoids and some other medications.  Review of medications in this patient case: Thrombocytosis has been reported with -heparin (doubt this is the cause)  Recommendations -There were no medications found (taken at home or in hospital) that are likely to be associated with thrombocytosis  Thank you for asking pharmacy to be involved in the care of this patient. Hildred Laser, Pharm D 03/23/2016 11:07 AM   Medications:  Prescriptions Prior to Admission  Medication Sig Dispense Refill Last Dose  . ALPRAZolam (XANAX) 1 MG tablet Take 0.5-1 mg by mouth 3 (three) times daily as needed for anxiety or sleep.    03/16/2016 at Unknown time  . amphetamine-dextroamphetamine (ADDERALL) 20 MG tablet Take 20 mg by mouth daily as needed (ADHD).    03/16/2016 at Unknown time  . cetirizine (ZYRTEC) 10 MG tablet Take 10 mg by mouth daily as needed for allergies.    03/16/2016 at Unknown time  . Cholecalciferol (VITAMIN D3) 5000 UNITS CAPS Take 5,000 Units by mouth 2 (two) times daily.    03/16/2016 at Unknown time  . gabapentin (NEURONTIN) 300 MG capsule Take 300-900 mg by mouth 3 (three) times daily. Take 300mg  in the am and afternoon, and then take 900mg  at night   03/16/2016 at Unknown time  . ibuprofen (ADVIL,MOTRIN) 200 MG tablet Take 800 mg by mouth every 6 (six) hours as needed for moderate pain.   03/16/2016 at Unknown time  .  oxycodone (ROXICODONE) 30 MG immediate release tablet Take 30 mg by mouth every 4 (four) hours as needed for pain (for pain). Reported on 06/03/2015   03/16/2016 at Unknown time  . sertraline (ZOLOFT) 100 MG tablet Take 100 mg by mouth at bedtime.   03/16/2016 at Unknown time  . torsemide (DEMADEX) 10 MG tablet Take 10 mg by mouth daily. And may take an extra dose if needed for fluid/edema   03/16/2016 at Unknown time  . amLODipine (NORVASC) 10 MG tablet Take 1 tablet (10 mg total) by mouth daily. (Patient not taking: Reported on 03/18/2016) 30 tablet 0 Not Taking at Unknown time  . cephALEXin (KEFLEX) 500 MG capsule Take 1 capsule (500 mg total) by mouth 3 (three) times daily. For 2days (Patient not taking: Reported on 03/18/2016) 6 capsule 0 Completed Course at Unknown time  . polyethylene glycol (MIRALAX / GLYCOLAX) packet Take 17 g by mouth daily as needed for moderate constipation. (Patient not taking: Reported on 03/18/2016) 14 each 0 Not Taking at Unknown time   Scheduled:  . aspirin EC  81 mg Oral Daily  .  ceFAZolin (ANCEF) IV  1 g Intravenous Q12H  . docusate sodium  100 mg Oral Daily  . gabapentin  300 mg Oral QHS  . heparin  5,000 Units Subcutaneous Q8H  . loratadine  10 mg Oral Daily  . metoprolol tartrate  25 mg Oral BID

## 2016-03-23 NOTE — Consult Note (Signed)
Physical Medicine and Rehabilitation Consult Reason for Consult: Multiple falls, left olecranon fracture, multiple medical Referring Physician: [facility]   HPI: Shirley [name] is a 76 y.o. right handed female with history of chronic pain, fibromyalgia followed by Dr. Hardin Negus, asthma/tobacco abuse. Recently placed on scheduled indomethacin over the past 2 weeks. Per chart review and patient, patient lives with spouse, who has a history of CVA x2. Used a cane prior to admission. One level home with 2 steps to entry. 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 Presented 03/18/2016 with altered mental status and frequent falls over the past 2-3 days with left arm and back pain. Cranial CT scan showed no evidence of traumatic intracranial injury or fracture. Apparent subacute or possibly chronic infarct at the inferior left occipital lobe new from 2005. Chest x-ray negative. Elevated creatinine 6.42 from a baseline 0.96. Urine drug screen positive opiates and benzodiazepine. Renal ultrasound with no hydronephrosis or echogenic stone. MRI of the brain showed no acute finding. X-rays left upper extremity revealed a displaced left olecranon fracture. Nephrology consulted for elevated creatinine placed on IV fluids. Felt  AKI most likely related to heavy NSAIDs. Renal function slowly stabilize with latest creatinine 1.90. Underwent surgical intervention of left olecranon fracture 03/22/2016 per Dr. Marcelino Scot. Currently nonweightbearing left upper extremity. Hospital course pain management. Physical occupational therapy evaluations pending. M.D. has requested physical medicine rehabilitation consult.   Review of Systems  Constitutional: Negative for chills and fever.  HENT: Negative for hearing loss and tinnitus.   Eyes: Negative for blurred vision and double vision.  Respiratory: Positive for shortness of breath. Negative for cough.   Cardiovascular: Positive for leg swelling. Negative for chest pain and palpitations.    Gastrointestinal: Positive for constipation. Negative for nausea and vomiting.  Genitourinary: Negative for dysuria, flank pain and hematuria.  Musculoskeletal: Positive for back pain, falls, joint pain and neck pain.  Skin: Negative for rash.  Neurological: Positive for weakness and headaches. Negative for seizures.  Psychiatric/Behavioral: Positive for depression.  All other systems reviewed and are negative.  Past Medical History:  Diagnosis Date  . Allergy    vasomotor rhinitis  . Asthma    h/o asthma and bronchitis  . BV (bacterial vaginosis)    history of frequent  . Cervical spondylosis    herniated disk protruding @ C6-7, impinging cord and left axillary root sleeve.  . Chronic pain   . Depression   . DVT of upper extremity (deep vein thrombosis) ([place])    h/o left(after wrist fracture/surgery); no residual thrombus or phlebitis by Dopplers July 2014  . Dyspnea    with exertion  . Edema   . Fibromyalgia    sees Dr.Phillips-pain management  . GERD (gastroesophageal reflux disease)    h/o  . Headache    marigraine- hormal - none in years  . Hypertension   . Neuropathy ([place])   . Neuropathy ([place])   . Osteopenia    mild at hip (T-1.3)  . PAT (paroxysmal atrial tachycardia) ([place])   . Varicose veins   . 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 Eilleen Kempf @ [facility]  . Vision problem    wears glasses  . Vitamin D deficiency    Past Surgical History:  Procedure Laterality Date  .  ABDOMINAL HYSTERECTOMY  1999   BSO for endometriosis  . APPENDECTOMY    . BACK SURGERY  age 51   ruptured disk L3-4   . CATARACT EXTRACTION  2000   B/L  . COLONOSCOPY    . DOPPLER ECHOCARDIOGRAPHY  July 2012   Normal EF 60-65% , mild concentric LVH. Grade 2 diastolic dysfunction with  elevated EDP. Massive left atrial dilation. Pulmonary pressures 30-40 mm Hg; aortic sclerosis but no stenosis. Moderate TR  . FINGER SURGERY     left finger for tender rupture from fall.  Marland Kitchen FOOT SURGERY     multiple(at least 4 on right, 5 on left)-left Dr.Kerner([place])11/08,left Dr.Nunley-excision 2nd and 3rd mt heads w/artho and hammertoe surgery 4th and 5th 04/2006. left great toe IP fusion and revision of fusion of 2nd through 5th toes 12/2005. Right foot surgeries  Dr.Bednarz 04/2008 and 04/2009.  Marland Kitchen KNEE SURGERY Right    right knee replacement  . NECK SURGERY  2007   cervical  . RADIOLOGY WITH ANESTHESIA Left 01/06/2016   Procedure: MRI LEFT HIP WITH AND WITHOUT;  Surgeon: Medication Radiologist, MD;  Location: [facility];  Service: Radiology;  Laterality: Left;  . REFRACTIVE SURGERY  2007   left eye  . SHOULDER SURGERY     x8 (4 on rt side and 4 on left side)  . SPINAL FUSION  6/09   Dr.Cohen  . TONSILLECTOMY AND ADENOIDECTOMY    . WRIST SURGERY Left    2 surgeries left wrist after fracture(complicated by DVT)   Family History  Problem Relation Age of Onset  . Stroke Mother   . Osteoarthritis Mother     Died at age 83  . Heart disease Father   . Lung cancer Father   . Heart failure Father     Died at age 78  . Osteoporosis Sister   . Diabetes Brother   . Heart attack Brother 66  . Diabetes Brother   . Diabetes Brother   . Heart attack Brother 95  . Breast cancer Paternal Aunt   . Heart failure Maternal Grandmother     Died at age 69, unsure of her heart disease  . Heart failure Paternal Grandfather     Died at age 67, unsure of cardiac disease.   Social History:  reports that she has been smoking.  She has never used smokeless tobacco. She reports that she does not drink alcohol or use drugs. Allergies:  Allergies  Allergen Reactions  . No Known Allergies    Medications Prior to Admission  Medication Sig Dispense Refill  . ALPRAZolam (XANAX) 1 MG tablet Take 0.5-1 mg  by mouth 3 (three) times daily as needed for anxiety or sleep.     Marland Kitchen amphetamine-dextroamphetamine (ADDERALL) 20 MG tablet Take 20 mg by mouth daily as needed (ADHD).     Marland Kitchen cetirizine (ZYRTEC) 10 MG tablet Take 10 mg by mouth daily as needed for allergies.     . Cholecalciferol (VITAMIN D3) 5000 UNITS CAPS Take 5,000 Units by mouth 2 (two) times daily.     Marland Kitchen gabapentin (NEURONTIN) 300 MG capsule Take 300-900 mg by mouth 3 (three) times daily. Take 300mg  in the am and afternoon, and then take 900mg  at night    . ibuprofen (ADVIL,MOTRIN) 200 MG tablet Take 800 mg by mouth every 6 (six) hours as needed for moderate pain.    Marland Kitchen oxycodone (ROXICODONE) 30 MG immediate release tablet Take 30 mg by mouth every 4 (four) hours as needed  for pain (for pain). Reported on 06/03/2015    . sertraline (ZOLOFT) 100 MG tablet Take 100 mg by mouth at bedtime.    . torsemide (DEMADEX) 10 MG tablet Take 10 mg by mouth daily. And may take an extra dose if needed for fluid/edema    . amLODipine (NORVASC) 10 MG tablet Take 1 tablet (10 mg total) by mouth daily. (Patient not taking: Reported on 03/18/2016) 30 tablet 0  . cephALEXin (KEFLEX) 500 MG capsule Take 1 capsule (500 mg total) by mouth 3 (three) times daily. For 2days (Patient not taking: Reported on 03/18/2016) 6 capsule 0  . polyethylene glycol (MIRALAX / GLYCOLAX) packet Take 17 g by mouth daily as needed for moderate constipation. (Patient not taking: Reported on 03/18/2016) 14 each 0    Home: Home Living Family/patient expects to be discharged to:: Private residence Living Arrangements: Spouse/significant other (pt has had a stroke, cannot provide assist) Available Help at Discharge: Family, Available PRN/intermittently Type of Home: House Home Access: Stairs to enter [employer] of Steps: 3 Entrance Stairs-Rails: Right, Left, Can reach both Home Layout: One level Bathroom Shower/Tub: Multimedia programmer: Handicapped height Home  Equipment: Environmental consultant - 2 wheels, [employer] - single point, Bedside commode  Lives With: Spouse  Functional History: Prior Function Level of Independence: Independent with assistive device(s) Comments: walked with a cane, had housekeeping assist Functional Status:  Mobility: Bed Mobility General bed mobility comments: pt OOB in recliner when PT entered room Transfers Overall transfer level: Needs assistance Equipment used: None Transfers: Sit to/from Stand Sit to Stand: +2 physical assistance, Min assist General transfer comment: pt required increased time, use of R UE on recliner arm rest, and min A x2 for physical assist and stability upon standing from recliner  Ambulation/Gait General Gait Details: pt able to participate in pre-gait activities including lateral weight shifting and stepping in place. Ambulation deferred at this time secondary to significant L hip pain (10/10)    ADL: ADL Overall ADL's : Needs assistance/impaired Eating/Feeding: Set up, Sitting Grooming: Wash/dry hands, Wash/dry face, Sitting, Set up Upper Body Bathing: Sitting, Maximal assistance Lower Body Bathing: Maximal assistance, Sit to/from stand Upper Body Dressing : Sitting, Maximal assistance Lower Body Dressing: Maximal assistance, Sit to/from stand Toilet Transfer: +2 for physical assistance, Minimal assistance, Stand-pivot General ADL Comments: pt with longstanding tremor  Cognition: Cognition Overall Cognitive Status: Within Functional Limits for tasks assessed Arousal/Alertness: Awake/alert Orientation Level: Oriented to person, Disoriented to time, Disoriented to situation, Oriented to place Attention: Focused Focused Attention: Appears intact Memory: Appears intact Awareness: Appears intact Problem Solving: Appears intact Safety/Judgment: Appears intact Cognition Arousal/Alertness: Awake/alert Behavior During Therapy: WFL for tasks assessed/performed Overall Cognitive Status: Within Functional  Limits for tasks assessed General Comments: pt able to answer PLOF questions, no family available to confirm  Blood pressure (!) 146/68, pulse 67, temperature 98.1 F (36.7 C), temperature source Oral, resp. rate 18, weight 81.8 kg (180 lb 5.4 oz), SpO2 98 %. Physical Exam  Vitals reviewed. Constitutional: She is oriented to person, place, and time. She appears well-developed and well-nourished.  HENT:  Head: Normocephalic and atraumatic.  Eyes: Conjunctivae and EOM are normal.  Neck: Normal range of motion. Neck supple. No thyromegaly present.  Cardiovascular: Normal rate and regular rhythm.   Respiratory: Effort normal and breath sounds normal. No respiratory distress.  GI: Soft. Bowel sounds are normal. She exhibits no distension. There is no tenderness.  Musculoskeletal: She exhibits edema and tenderness.  Neurological: She is alert  and oriented to person, place, and time.  Motor: RUE: 5/5 proximal to distal RLE: 4+/5 proximal to distal LUE: Limited by splint LLE: HF, KE 4-/5, ADP/PF 4/5  Sensation diminished to light touch b/l feet  Skin: Skin is warm and dry.  Left upper extremity with surgical dressing in place appropriately tender  Psychiatric: She has a normal mood and affect. Her behavior is normal.    Results for orders placed or performed during the hospital encounter of 03/17/16 (from the past 24 hour(s))  Glucose, capillary     Status: Abnormal   Collection Time: 03/22/16 11:54 AM  Result Value Ref Range   Glucose-Capillary 118 (H) 65 - 99 mg/dL  Renal function panel     Status: Abnormal   Collection Time: 03/23/16  3:57 AM  Result Value Ref Range   Sodium 138 135 - 145 mmol/L   Potassium 3.7 3.5 - 5.1 mmol/L   Chloride 108 101 - 111 mmol/L   CO2 22 22 - 32 mmol/L   Glucose, Bld 184 (H) 65 - 99 mg/dL   BUN 37 (H) 6 - 20 mg/dL   Creatinine, Ser 1.90 (H) 0.44 - 1.00 mg/dL   Calcium 8.4 (L) 8.9 - 10.3 mg/dL   Phosphorus 3.7 2.5 - 4.6 mg/dL   Albumin 2.7 (L) 3.5  - 5.0 g/dL   GFR calc non Af Amer 24 (L) >60 mL/min   GFR calc Af Amer 28 (L) >60 mL/min   Anion gap 8 5 - 15  CBC     Status: Abnormal   Collection Time: 03/23/16  3:57 AM  Result Value Ref Range   WBC 11.6 (H) 4.0 - 10.5 K/uL   RBC 3.68 (L) 3.87 - 5.11 MIL/uL   Hemoglobin 9.9 (L) 12.0 - 15.0 g/dL   HCT 32.7 (L) 36.0 - 46.0 %   MCV 88.9 78.0 - 100.0 fL   MCH 26.9 26.0 - 34.0 pg   MCHC 30.3 30.0 - 36.0 g/dL   RDW 15.2 11.5 - 15.5 %   Platelets 1,242 (HH) 150 - 400 K/uL   Dg Elbow 2 Views Left  Result Date: 03/22/2016 CLINICAL DATA:  Postop ORIF EXAM: LEFT ELBOW - 2 VIEW COMPARISON:  Intraoperative fluoroscopic radiographs dated 03/22/2016 FINDINGS: Compression plate and screw fixation of an olecranon fracture. Fracture fragments are in near anatomic alignment and position. Overlying cast obscures fine osseous detail. IMPRESSION: Status post ORIF of an olecranon fracture, as above. Electronically Signed   By: Julian Hy M.D.   On: 03/22/2016 16:34   Dg Elbow Complete Left  Result Date: 03/22/2016 CLINICAL DATA:  Olecranon fracture due to a fall 03/17/2016. Intraoperative imaging for open reduction and internal fixation. EXAM: LEFT ELBOW - COMPLETE 3+ VIEW; DG C-ARM 61-120 MIN COMPARISON:  Plain films of the left elbow 03/18/2016. FINDINGS: We are provided with 5 fluoroscopic intraoperative spot views of the left elbow. Images demonstrate placement of posterior plate and screws for fixation of an olecranon fracture. Position and alignment are anatomic. Hardware is intact. No new abnormality. IMPRESSION: ORIF left olecranon fracture.  No acute finding. Electronically Signed   By: Inge Rise M.D.   On: 03/22/2016 15:56   Dg C-arm 1-60 Min  Result Date: 03/22/2016 CLINICAL DATA:  Olecranon fracture due to a fall 03/17/2016. Intraoperative imaging for open reduction and internal fixation. EXAM: LEFT ELBOW - COMPLETE 3+ VIEW; DG C-ARM 61-120 MIN COMPARISON:  Plain films of the left  elbow 03/18/2016. FINDINGS: We are provided with 5 fluoroscopic  intraoperative spot views of the left elbow. Images demonstrate placement of posterior plate and screws for fixation of an olecranon fracture. Position and alignment are anatomic. Hardware is intact. No new abnormality. IMPRESSION: ORIF left olecranon fracture.  No acute finding. Electronically Signed   By: Inge Rise M.D.   On: 03/22/2016 15:56    Assessment/Plan: Diagnosis: Multiple falls, left olecranon fracture, multiple medical Labs and images independently reviewed.  Records reviewed and summated above.  1. Does the need for close, 24 hr/day medical supervision in concert with the patient's rehab needs make it unreasonable for this patient to be served in a less intensive setting? Potentially  2. Co-Morbidities requiring supervision/potential complications: chronic pain with post-op pain (Biofeedback training with therapies to help reduce reliance on opiate pain medications, particularly IV morphine, monitor pain control during therapies, and sedation at rest and titrate to maximum efficacy to ensure participation and gains in therapies), fibromyalgia (cont meds), asthma (monitor RR and O2 sats with increased activity), tobacco abuse (counsel), AKI (avoid nephrotoxic meds), leukocytosis (cont to monitor for signs and symptoms of infection, further workup if indicated), HTN (monitor and provide prns in accordance with increased physical exertion and pain), hyperglycemia (Monitor in accordance with exercise and adjust meds as necessary), ABLA (transfuse if necessary to ensure appropriate perfusion for increased activity tolerance), thrombocytosis (cont to monitor, consider further workup) 3. Due to safety, skin/wound care, disease management, pain management and patient education, does the patient require 24 hr/day rehab nursing? Potentially 4. Does the patient require coordinated care of a physician, rehab nurse, PT (1-2 hrs/day, 5  days/week) and OT (1-2 hrs/day, 5 days/week) to address physical and functional deficits in the context of the above medical diagnosis(es)? Potentially Addressing deficits in the following areas: balance, endurance, locomotion, strength, transferring, bowel/bladder control, bathing, dressing, toileting and psychosocial support 5. Can the patient actively participate in an intensive therapy program of at least 3 hrs of therapy per day at least 5 days per week? Potentially 6. The potential for patient to make measurable gains while on inpatient rehab is excellent 7. Anticipated functional outcomes upon discharge from inpatient rehab are TBD  with PT, TBD with OT, n/a with SLP. 8. Estimated rehab length of stay to reach the above functional goals is: TBD 9. Does the patient have adequate social supports and living environment to accommodate these discharge functional goals? Potentially 10. Anticipated D/C setting: Home 11. Anticipated post D/C treatments: HH therapy and Home excercise program 12. Overall Rehab/Functional Prognosis: excellent  RECOMMENDATIONS: This patient's condition is appropriate for continued rehabilitative care in the following setting: Will await completion of medical workup as well as evaluation by therapies to ensure pt with able to achieve a Mod I level of functioning at discharge as pt's husband has had CVAx2.  Patient has agreed to participate in recommended program. Yes Note that insurance prior authorization may be required for reimbursement for recommended care.  Comment: Rehab Admissions Coordinator to follow up.  Delice Lesch, MD, Mellody Drown Cathlyn Parsons., PA-C 03/23/2016

## 2016-03-23 NOTE — Progress Notes (Signed)
Rehab admissions - Awaiting PT/OT evaluations and recommendations.  Will then follow up for potential acute inpatient rehab needs.  Call me for questions.  #[phone]

## 2016-03-23 NOTE — Progress Notes (Signed)
Occupational Therapy Treatment Patient Details Name: Shirley [name] MRN: [id number] DOB: 11/17/1940 Today's Date: 03/23/2016    History of present illness Pt admitted with frequent falls, confusion, UTI, acute renal failure and L olecranon fx. Plan for ORIF of L elbow 1/29. Neuro consult pending, possible subacute CVA. PMH: multiple orthopedic surgeries, tachycardia, neuropathy, depression, fibromyalgia.Marland Kitchen ORIF L elbow 1/29.    OT comments  Daughter present during assessment and asking to speak to a case manager about her mother's DC plan. Completed LUE A/AAROM as tolerated. Educated pt/family and staff on edema control (need to elevate LUE on 2 pillows and use sling only when walking or for transfers). Feel pt is an excellent CIR candidate with follow up [facility]. If not accepted by CIR, will need SNF. Will follow acutely to address established goals.   Follow Up Recommendations  CIR;Supervision/Assistance - 24 hour    Equipment Recommendations  3 in 1 bedside commode    Recommendations for Other Services Rehab consult    Precautions / Restrictions Precautions Precautions: Fall Precaution Comments: L UE in splint Required Braces or Orthoses: Other Brace/Splint Other Brace/Splint: L UE in splint Restrictions Weight Bearing Restrictions: Yes LUE Weight Bearing: Non weight bearing       Mobility Bed Mobility Overal bed mobility: Needs Assistance Bed Mobility: Supine to Sit     Supine to sit: Mod assist;Max assist     General bed mobility comments: OOB in chair  Transfers Overall transfer level: Needs assistance Equipment used: 1 person hand held assist Transfers: Sit to/from Stand Sit to Stand: Mod assist         General transfer comment: pt unable to use L UE to assist, modA to power up    Balance Overall balance assessment: History of Falls Sitting-balance support: Feet supported Sitting balance-Leahy Scale: Fair     Standing balance support: During functional  activity;Single extremity supported Standing balance-Leahy Scale: Poor                     ADL Overall ADL's : Needs assistance/impaired Eating/Feeding: Set up;Sitting   Grooming: Moderate assistance Grooming Details (indicate cue type and reason): unable to wash hair, put on deoderant Upper Body Bathing: Moderate assistance   Lower Body Bathing: Maximal assistance   Upper Body Dressing : Maximal assistance   Lower Body Dressing: Maximal assistance   Toilet Transfer: Moderate assistance   Toileting- Clothing Manipulation and Hygiene: Minimal assistance       Functional mobility during ADLs: +2 for safety/equipment;Moderate assistance        Vision                     Perception     Praxis      Cognition   Behavior During Therapy: WFL for tasks assessed/performed Overall Cognitive Status: Impaired/Different from baseline                  General Comments: Daughter states pt is returning to her basleine cognition level. decreased STM. decreased safety/judgement    Extremity/Trunk Assessment               Exercises Other Exercises Other Exercises: L shoulder AAROM FF; ER, Abd x 10 as tolerated Other Exercises: L wrist and hand ROM Other Exercises: retrograde edema massage L hand   Shoulder Instructions       General Comments      Pertinent Vitals/ Pain       Pain Assessment: 0-10 Pain Score: 6  Pain Location: L UE Pain Descriptors / Indicators: Aching;Discomfort Pain Intervention(s): Limited activity within patient's tolerance;Repositioned;Ice applied;Patient requesting pain meds-RN notified  Home Living                                          Prior Functioning/Environment              Frequency  Min 3X/week        Progress Toward Goals  OT Goals(current goals can now be found in the care plan section)  Progress towards OT goals: Progressing toward goals  Acute Rehab OT Goals Patient  Stated Goal: stop falling OT Goal Formulation: With patient Time For Goal Achievement: 04/02/16 Potential to Achieve Goals: Good ADL Goals Pt Will Perform Grooming: with supervision;standing Pt Will Perform Upper Body Dressing: with supervision Pt Will Perform Lower Body Dressing: with supervision;sitting/lateral leans Pt Will Transfer to Toilet: with supervision;ambulating;bedside commode Pt Will Perform Toileting - Clothing Manipulation and hygiene: with supervision;sit to/from stand Additional ADL Goal #1: Pt will be don and doff L UE sling independently.  Plan Discharge plan remains appropriate;Frequency needs to be updated    Co-evaluation                 End of Session     Activity Tolerance Patient tolerated treatment well   Patient Left in chair;with call bell/phone within reach;with family/visitor present   Nurse Communication Mobility status;Other (comment) (positionoing of LUE in elevated position to reduce edema)        Time: 5625-6389 OT Time Calculation (min): 28 min  Charges: OT General Charges $OT Visit: 1 Procedure OT Treatments $Self Care/Home Management : 8-22 mins $Therapeutic Exercise: 8-22 mins  Melburn Treiber,HILLARY 03/23/2016, 2:32 PM   [facility], OT/L  [phone] 03/23/2016

## 2016-03-23 NOTE — Anesthesia Postprocedure Evaluation (Addendum)
Anesthesia Post Note  Patient: Shirley [name]  Procedure(s) Performed: Procedure(s) (LRB): OPEN REDUCTION INTERNAL FIXATION (ORIF) ELBOW/OLECRANON FRACTURE (Left)  Patient location during evaluation: PACU Anesthesia Type: General Level of consciousness: awake and sedated Pain management: pain level controlled Vital Signs Assessment: post-procedure vital signs reviewed and stable Respiratory status: spontaneous breathing Cardiovascular status: stable Postop Assessment: no signs of nausea or vomiting Anesthetic complications: no        Last Vitals:  Vitals:   03/23/16 1052 03/23/16 1338  BP: (!) 150/64 133/63  Pulse: 65 71  Resp: 14 18  Temp: 37 C 36.8 C    Last Pain:  Vitals:   03/23/16 1600  TempSrc:   PainSc: 4    Pain Goal: Patients Stated Pain Goal: 0 (03/23/16 0059)               Chidinma Clites JR,JOHN Mateo Flow

## 2016-03-23 NOTE — Progress Notes (Signed)
PROGRESS NOTE    Shirley [name]  YWV:[id number] DOB: [birth year] DOA: 03/17/2016 PCP: Ardith Dark, PA-C   Brief Narrative: 76 y.o.femalewith history of chronic pain and arthritis was recently placed on indomethacin  was brought to the ER after patient was found to be confused and had a fall. Workup significant for acute renal failure with a creatinine of 6.5, with anion gap of 18, possible UTI, CT with possible subacute occipital infarct, but no evidence of acute infarct on MRI brain,she has left elbow olecranon fracture, seen by orthopedic with the plan for surgery on 03/22/2016.  Assessment & Plan:   # Acute renal failure: Most likely related with NSAIDs use. UA unremarkable. Ultrasound kidneys consistent with possible chronic medical renal disease. Patient has normal serum creatinine level at baseline, had serum creatinine level of 6.5 on admission. Patient was treated with IV fluid and evaluated by nephrologist. -Serum creatinine level trended down to 1.9 today. -Monitor BMP, urine output. Avoid nephrotoxins. Needs follow-up with nephrologist as an outpatient.  # Left olecranon fracture s/p ORIF on 1/29 by orthopedics.  -added morphine prn for pain management -discussed with ortho team today -PT/OT and IPR eval - orthopedic consult appreciated  # Acute on chronic thrombocytosis likely reactive/stress related in the setting of fracture. Hematology consult called and discussed with Dr. Irene Limbo. Monitor CBC.  # Acute encephalopathy: Multifactorial etiology including metabolic encephalopathy in the setting of electrolyte imbalance, renal failure and possibility pain medications. Patient's mental status is back to baseline. She has no focal neurological deficit. - CT showing possible subacute infarct,But MRI brain with no acute findings. -On renal dose of Neurontin.  # UTI, asymptomatic bacteriuria - no pyuria but significant bacteriuria , urine cultures aren't helpful as growing  multiple species, treated with 3 days of Rocephin .  # SVT - recent echo done with EF 65%, no regional wall motion abnormalities,  -Prior MD discussed with cardiology Dr. Oval Linsey, she'll need outpatient follow-up with cardiology, and to start on beta blockers if blood pressure allows,  -On metoprolol 25 mg twice a day  # Hypertension - Blood pressure is suboptimally controlled. Patient is currently on metoprolol twice a day. I will not start new medications since she is going to or today and will get anesthesia. On hydralazine as needed for uncontrolled hypertension.  # Chronic pain syndrome and arthritis - Increased pain after the LUE surgery, added IV morphine prn by ortho team. I will add colace and miralax as needed.  Continue PT OT evaluation and treatment.  # Chronic anemia, iron deficiency anemia.  - Workup significant for low iron and B-12 level, continue with B-12 supplement, received IV iron by renal, Hemoccult negative, will need iron deficiency anemia as outpatient. -Hemoglobin is stable today.  Principal Problem:   Acute renal failure (HCC) Active Problems:   Chronic pain syndrome   Acute encephalopathy   Closed olecranon fracture, left, initial encounter   Fall   Fracture   Normochromic normocytic anemia   Post-operative pain   Fibromyalgia   Uncomplicated asthma   Tobacco abuse   AKI (acute kidney injury) (HCC)   Leukocytosis   Other secondary hypertension   Hyperglycemia   Acute blood loss anemia   Thrombocytosis (HCC)  DVT prophylaxis: Heparin subcutaneous Code Status: Full code Family Communication: No family present at bedside Disposition Plan: Likely discharge home versus rehabilitation in 1-2 days.    Consultants:   Orthopedics  Nephrologist  Hematologist   Procedures: ORIF Antimicrobials: Received 2 days of antibiotics  Subjective: Patient was seen and examined at bedside. Patient reported a rough night because of left upper  extremity pain. No other new event. Denied nausea vomiting, chest pain, shortness of breath.   Objective: Vitals:   03/22/16 2140 03/23/16 0438 03/23/16 0444 03/23/16 1052  BP:   (!) 146/68 (!) 150/64  Pulse: 64  67 65  Resp:   18   Temp:   98.1 F (36.7 C) 98.6 F (37 C)  TempSrc:   Oral Oral  SpO2:   98% 97%  Weight:  81.8 kg (180 lb 5.4 oz)      Intake/Output Summary (Last 24 hours) at 03/23/16 1103 Last data filed at 03/23/16 1103  Gross per 24 hour  Intake             1800 ml  Output             1425 ml  Net              375 ml   Filed Weights   03/20/16 0237 03/21/16 0418 03/23/16 0438  Weight: 81.4 kg (179 lb 7.3 oz) 79.6 kg (175 lb 7.8 oz) 81.8 kg (180 lb 5.4 oz)    Examination:  General exam: Not in distress, lying in bed comfortable. Respiratory system: Clear bilateral, no wheezing or crackle. Cardiovascular system: S1 & S2 heard, RRR.  No pedal edema. Gastrointestinal system: Abdomen soft, nontender, bowel sounds positive. Extremities: Left upper extremity has a cast and sling. Able to move fingers. Skin: No rashes, lesions or ulcers Psychiatry: Judgement and insight appear normal. Mood & affect appropriate.     Data Reviewed: I have personally reviewed following labs and imaging studies  CBC:  Recent Labs Lab 03/19/16 0305 03/20/16 0210 03/21/16 0258 03/22/16 0402 03/23/16 0357  WBC 6.7 8.1 8.7 8.6 11.6*  HGB 8.8* 9.3* 10.3* 10.1* 9.9*  HCT 28.2* 29.9* 33.1* 32.0* 32.7*  MCV 87.3 87.4 87.8 87.0 88.9  PLT 763* 866* 860* 1,055* 3,151*   Basic Metabolic Panel:  Recent Labs Lab 03/19/16 0305 03/20/16 0210 03/21/16 0258 03/22/16 0402 03/23/16 0357  NA 138 138 138 142  140 138  K 4.2 4.1 5.1 4.0  3.9 3.7  CL 109 110 110 112*  111 108  CO2 19* 19* 19* 23  23 22   GLUCOSE 90 107* 156* 100*  102* 184*  BUN 74* 64* 55* 43*  43* 37*  CREATININE 5.03* 4.04* 2.94* 2.20*  2.20* 1.90*  CALCIUM 7.8* 8.0* 8.6* 8.7*  8.7* 8.4*  MG  --   --    --  1.4*  --   PHOS  --   --   --  3.8 3.7   GFR: Estimated Creatinine Clearance: 25.6 mL/min (by C-G formula based on SCr of 1.9 mg/dL (H)). Liver Function Tests:  Recent Labs Lab 03/17/16 2205 03/18/16 0600 03/22/16 0402 03/23/16 0357  AST 25 18  --   --   ALT 14 13*  --   --   ALKPHOS 80 63  --   --   BILITOT 0.5 0.9  --   --   PROT 7.1 5.4*  --   --   ALBUMIN 4.2 3.1* 2.6* 2.7*   No results for input(s): LIPASE, AMYLASE in the last 168 hours.  Recent Labs Lab 03/18/16 0731  AMMONIA 26   Coagulation Profile: No results for input(s): INR, PROTIME in the last 168 hours. Cardiac Enzymes:  Recent Labs Lab 03/18/16 0600  CKTOTAL 258*   BNP (  last 3 results) No results for input(s): PROBNP in the last 8760 hours. HbA1C: No results for input(s): HGBA1C in the last 72 hours. CBG:  Recent Labs Lab 03/22/16 1154  GLUCAP 118*   Lipid Profile: No results for input(s): CHOL, HDL, LDLCALC, TRIG, CHOLHDL, LDLDIRECT in the last 72 hours. Thyroid Function Tests: No results for input(s): TSH, T4TOTAL, FREET4, T3FREE, THYROIDAB in the last 72 hours. Anemia Panel: No results for input(s): VITAMINB12, FOLATE, FERRITIN, TIBC, IRON, RETICCTPCT in the last 72 hours. Sepsis Labs:  Recent Labs Lab 03/17/16 2214  LATICACIDVEN 1.22    Recent Results (from the past 240 hour(s))  Urine culture     Status: Abnormal   Collection Time: 03/18/16 12:52 AM  Result Value Ref Range Status   Specimen Description URINE, RANDOM  Final   Special Requests NONE  Final   Culture MULTIPLE SPECIES PRESENT, SUGGEST RECOLLECTION (A)  Final   Report Status 03/19/2016 FINAL  Final  MRSA PCR Screening     Status: None   Collection Time: 03/20/16 12:23 PM  Result Value Ref Range Status   MRSA by PCR NEGATIVE NEGATIVE Final    Comment:        The GeneXpert MRSA Assay (FDA approved for NASAL specimens only), is one component of a comprehensive MRSA colonization surveillance program. It is  not intended to diagnose MRSA infection nor to guide or monitor treatment for MRSA infections.          Radiology Studies: Dg Elbow 2 Views Left  Result Date: 03/22/2016 CLINICAL DATA:  Postop ORIF EXAM: LEFT ELBOW - 2 VIEW COMPARISON:  Intraoperative fluoroscopic radiographs dated 03/22/2016 FINDINGS: Compression plate and screw fixation of an olecranon fracture. Fracture fragments are in near anatomic alignment and position. Overlying cast obscures fine osseous detail. IMPRESSION: Status post ORIF of an olecranon fracture, as above. Electronically Signed   By: Julian Hy M.D.   On: 03/22/2016 16:34   Dg Elbow Complete Left  Result Date: 03/22/2016 CLINICAL DATA:  Olecranon fracture due to a fall 03/17/2016. Intraoperative imaging for open reduction and internal fixation. EXAM: LEFT ELBOW - COMPLETE 3+ VIEW; DG C-ARM 61-120 MIN COMPARISON:  Plain films of the left elbow 03/18/2016. FINDINGS: We are provided with 5 fluoroscopic intraoperative spot views of the left elbow. Images demonstrate placement of posterior plate and screws for fixation of an olecranon fracture. Position and alignment are anatomic. Hardware is intact. No new abnormality. IMPRESSION: ORIF left olecranon fracture.  No acute finding. Electronically Signed   By: Inge Rise M.D.   On: 03/22/2016 15:56   Dg C-arm 1-60 Min  Result Date: 03/22/2016 CLINICAL DATA:  Olecranon fracture due to a fall 03/17/2016. Intraoperative imaging for open reduction and internal fixation. EXAM: LEFT ELBOW - COMPLETE 3+ VIEW; DG C-ARM 61-120 MIN COMPARISON:  Plain films of the left elbow 03/18/2016. FINDINGS: We are provided with 5 fluoroscopic intraoperative spot views of the left elbow. Images demonstrate placement of posterior plate and screws for fixation of an olecranon fracture. Position and alignment are anatomic. Hardware is intact. No new abnormality. IMPRESSION: ORIF left olecranon fracture.  No acute finding.  Electronically Signed   By: Inge Rise M.D.   On: 03/22/2016 15:56        Scheduled Meds: . aspirin EC  81 mg Oral Daily  .  ceFAZolin (ANCEF) IV  1 g Intravenous Q12H  . docusate sodium  100 mg Oral Daily  . gabapentin  300 mg Oral QHS  . heparin  5,000 Units Subcutaneous Q8H  . loratadine  10 mg Oral Daily  . metoprolol tartrate  25 mg Oral BID   Continuous Infusions: . sodium chloride 10 mL/hr at 03/23/16 0607     LOS: 5 days    Ayannah Faddis Tanna Furry, MD [facility] Pager [phone]  If 7PM-7AM, please contact night-coverage [email or web address] Password [email or web address] 03/23/2016, 11:03 AM

## 2016-03-23 NOTE — Consult Note (Signed)
Shirley [name]    HEMATOLOGY/ONCOLOGY CONSULTATION NOTE  Date of Service: 03/23/2016  Patient Care Team: Ardith Dark, PA-C as PCP - General (Physician Assistant)  CHIEF COMPLAINTS/PURPOSE OF CONSULTATION:  Thrombocytosis  HISTORY OF PRESENTING ILLNESS:   Shirley [name] is a wonderful 76 y.o. female who has been referred to [place] by Dr Rica Mast Tanna Furry, MD for evaluation and management of thrombocytosis.  Patient has a history of asthma, depression, fibromyalgia, GERD, PAT and previous history of DVT of her left upper extremity in July 2014 presumed to be from a wrist fracture/surgery. She was admitted to the hospital on 03/18/2016 with confusion and a fall with a fractured left olecranon process. There was concern with a urinary tract infection and she was also noted to be dehydrated and with acute kidney injury talk to be related to dehydration and recent use of indomethacin for the last 2 weeks prior to hospitalization.  Patient was noted to have thrombocytosis with platelet counts of 870k on 03/17/2016 and these have climbed to over 1 million over the last few days in the setting of hospitalization for UTI, dehydration, AKI and ORIF of left olecranon fracture. She was noted to have normal WBC count of 9.2k on admission and mild anemia with a hemoglobin of 10.6    review of her previous labs suggest that she has had elevated platelet counts of 558k in November 2017 and 627K on 12/22/2015 even previously this admission.  As part of her anemia workup she has had an SPEP which showed no M spike, low-normal B12 levels at 266, normal folate level of 10, ferritin of 73 with iron saturation of 6%  Her platelet counts have progressively increased and her today and 1242k.   She reports no previous splenic trauma or splenectomy. No fevers no chills no night sweats no acute weight loss.  Notes that she has always had issues with her bones and was noted to have significant arthritis.  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    MEDICAL HISTORY:  Past Medical History:  Diagnosis Date  . Allergy    vasomotor rhinitis  . Asthma    h/o asthma and bronchitis  . BV (bacterial vaginosis)    history of frequent  . Cervical spondylosis    herniated disk protruding @ C6-7, impinging cord and left axillary root sleeve.  . Chronic pain   . Closed olecranon fracture, left, initial encounter   . Depression   . DVT of upper extremity (deep vein thrombosis) ([place])    h/o left(after wrist fracture/surgery); no residual thrombus or phlebitis by Dopplers July 2014  . Dyspnea    with exertion  . Edema   . Fibromyalgia    sees Dr.Phillips-pain management  . GERD (gastroesophageal reflux disease)    h/o  . Headache    marigraine- hormal - none in years  . Hypertension   . Neuropathy ([place])   . Neuropathy ([place])   . Osteopenia    mild at hip (T-1.3)  . PAT (paroxysmal atrial tachycardia) ([place])   . Varicose veins   . 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 Eilleen Kempf @ [facility]  . Vision problem    wears glasses  . Vitamin D deficiency     SURGICAL HISTORY: Past Surgical History:  Procedure Laterality Date  . ABDOMINAL HYSTERECTOMY  1999   BSO for endometriosis  . APPENDECTOMY    . BACK SURGERY  age 89   ruptured disk L3-4   . CATARACT EXTRACTION  2000   B/L  . COLONOSCOPY    . DOPPLER ECHOCARDIOGRAPHY  July 2012   Normal EF 60-65% , mild concentric LVH. Grade 2 diastolic dysfunction with elevated EDP. Massive left atrial dilation. Pulmonary pressures 30-40 mm Hg; aortic sclerosis but no stenosis. Moderate TR  . FINGER SURGERY      left finger for tender rupture from fall.  Shirley [name] FOOT SURGERY     multiple(at least 4 on right, 5 on left)-left Dr.Kerner([place])11/08,left Dr.Nunley-excision 2nd and 3rd mt heads w/artho and hammertoe surgery 4th and 5th 04/2006. left great toe IP fusion and revision of fusion of 2nd through 5th toes 12/2005. Right foot surgeries  Dr.Bednarz 04/2008 and 04/2009.  Shirley [name] KNEE SURGERY Right    right knee replacement  . NECK SURGERY  2007   cervical  . RADIOLOGY WITH ANESTHESIA Left 01/06/2016   Procedure: MRI LEFT HIP WITH AND WITHOUT;  Surgeon: Medication Radiologist, MD;  Location: [place];  Service: Radiology;  Laterality: Left;  . REFRACTIVE SURGERY  2007   left eye  . SHOULDER SURGERY     x8 (4 on rt side and 4 on left side)  . SPINAL FUSION  6/09   Dr.Cohen  . TONSILLECTOMY AND ADENOIDECTOMY    . WRIST SURGERY Left    2 surgeries left wrist after fracture(complicated by DVT)    SOCIAL HISTORY: Social History   Social History  . Marital status: Married    Spouse name: [place]  . Number of children: N/A  . Years of education: N/A   Occupational History  . Not on file.   Social History Main Topics  . Smoking status: Current Some Day Smoker  . Smokeless tobacco: Never Used     Comment: doesnt smoke every day  . Alcohol use No  . Drug use: No  . Sexual activity: Not on file   Other Topics Concern  . Not on file   Social History Narrative   Lives with husband.  3 children ([place], [place], [place]), 5 grandchildren    FAMILY HISTORY: Family History  Problem Relation Age of Onset  . Stroke Mother   . Osteoarthritis Mother     Died at age 16  . Heart disease Father   . Lung cancer Father   . Heart failure Father     Died at age 38  . Osteoporosis Sister   . Diabetes Brother   . Heart attack Brother 71  . Diabetes Brother   . Diabetes Brother   . Heart attack Brother 59  . Breast cancer Paternal Aunt   . Heart failure Maternal Grandmother     Died at age 73, unsure of  her heart disease  . Heart failure Paternal Grandfather     Died at age 73, unsure of cardiac disease.    ALLERGIES:  is allergic to no known allergies.  MEDICATIONS:  Current Facility-Administered Medications  Medication Dose Route Frequency Provider Last Rate Last Dose  . 0.9 %  sodium chloride infusion   Intravenous Continuous Josephine Igo, MD 10 mL/hr at 03/23/16 [phone]    . acetaminophen (TYLENOL) tablet 650 mg  650  mg Oral Q4H PRN Rise Patience, MD   650 mg at 03/23/16 0825   Or  . acetaminophen (TYLENOL) solution 650 mg  650 mg Per Tube Q4H PRN Rise Patience, MD       Or  . acetaminophen (TYLENOL) suppository 650 mg  650 mg Rectal Q4H PRN Rise Patience, MD      . ALPRAZolam Duanne Moron) tablet 0.5-1 mg  0.5-1 mg Oral TID PRN Rise Patience, MD   0.5 mg at 03/23/16 0435  . amphetamine-dextroamphetamine (ADDERALL) tablet 20 mg  20 mg Oral Daily PRN Rise Patience, MD      . aspirin EC tablet 81 mg  81 mg Oral Daily Albertine Patricia, MD   81 mg at 03/23/16 2836  . docusate sodium (COLACE) capsule 100 mg  100 mg Oral Daily Hollace Kinnier Smoke Rise, PA-C   100 mg at 03/23/16 6294  . gabapentin (NEURONTIN) tablet 300 mg  300 mg Oral QHS Rise Patience, MD   300 mg at 03/22/16 2144  . heparin injection 5,000 Units  5,000 Units Subcutaneous Q8H Rise Patience, MD   5,000 Units at 03/23/16 1421  . hydrALAZINE (APRESOLINE) injection 10 mg  10 mg Intravenous Q4H PRN Albertine Patricia, MD   10 mg at 03/22/16 0859  . loratadine (CLARITIN) tablet 10 mg  10 mg Oral Daily Rise Patience, MD   10 mg at 03/23/16 7654  . metoprolol tartrate (LOPRESSOR) tablet 25 mg  25 mg Oral BID Albertine Patricia, MD   25 mg at 03/23/16 6503  . morphine 2 MG/ML injection 2 mg  2 mg Intravenous Q2H PRN Ainsley Spinner, PA-C   2 mg at 03/23/16 1235  . ondansetron (ZOFRAN) tablet 4 mg  4 mg Oral Q6H PRN Rise Patience, MD   4 mg at 03/23/16 1429   Or  . ondansetron (ZOFRAN) injection 4  mg  4 mg Intravenous Q6H PRN Rise Patience, MD   4 mg at 03/22/16 1540  . oxyCODONE (Oxy IR/ROXICODONE) immediate release tablet 10-20 mg  10-20 mg Oral Q6H PRN Ainsley Spinner, PA-C   20 mg at 03/23/16 1518  . polyethylene glycol (MIRALAX / GLYCOLAX) packet 17 g  17 g Oral Daily PRN Dron Tanna Furry, MD        REVIEW OF SYSTEMS:    10 Point review of Systems was done is negative except as noted above.  PHYSICAL EXAMINATION: ECOG PERFORMANCE STATUS: 2 - Symptomatic, <50% confined to bed  . Vitals:   03/23/16 1052 03/23/16 1338  BP: (!) 150/64 133/63  Pulse: 65 71  Resp: 14 18  Temp: 98.6 F (37 C) 98.2 F (36.8 C)   Filed Weights   03/20/16 0237 03/21/16 0418 03/23/16 0438  Weight: 179 lb 7.3 oz (81.4 kg) 175 lb 7.8 oz (79.6 kg) 180 lb 5.4 oz (81.8 kg)   .Body mass index is 30.95 kg/m.  GENERAL:alert, in Mild discomfort due to postoperative pain in her left elbow with movement. SKIN: no acute rashes EYES: mild pallor,  OROPHARYNX:no exudate, no erythema and lips, buccal mucosa, and tongue normal  NECK: supple, no JVD, thyroid normal size, non-tender, without nodularity LYMPH:  no palpable lymphadenopathy in the cervical, axillary or inguinal LUNGS: clear to auscultation with normal respiratory effort HEART: regular rate & rhythm,  no murmurs and no lower extremity edema ABDOMEN: abdomen soft, non-tender, normoactive bowel sounds  Musculoskeletal: no cyanosis of digits and no clubbing  PSYCH:  alert & oriented x 3 with fluent speech NEURO: no focal motor/sensory deficits  LABORATORY DATA:  I have reviewed the data as listed  . CBC Latest Ref Rng & Units 03/23/2016 03/22/2016 03/21/2016  WBC 4.0 - 10.5 K/uL 11.6(H) 8.6 8.7  Hemoglobin 12.0 - 15.0 g/dL 9.9(L) 10.1(L) 10.3(L)  Hematocrit 36.0 - 46.0 % 32.7(L) 32.0(L) 33.1(L)  Platelets 150 - 400 K/uL 1,242(HH) 1,055(HH) 860(H)    . CMP Latest Ref Rng & Units 03/23/2016 03/22/2016 03/22/2016  Glucose 65 - 99 mg/dL  184(H) 100(H) 102(H)  BUN 6 - 20 mg/dL 37(H) 43(H) 43(H)  Creatinine 0.44 - 1.00 mg/dL 1.90(H) 2.20(H) 2.20(H)  Sodium 135 - 145 mmol/L 138 142 140  Potassium 3.5 - 5.1 mmol/L 3.7 4.0 3.9  Chloride 101 - 111 mmol/L 108 112(H) 111  CO2 22 - 32 mmol/L 22 23 23   Calcium 8.9 - 10.3 mg/dL 8.4(L) 8.7(L) 8.7(L)  Total Protein 6.5 - 8.1 g/dL - - -  Total Bilirubin 0.3 - 1.2 mg/dL - - -  Alkaline Phos 38 - 126 U/L - - -  AST 15 - 41 U/L - - -  ALT 14 - 54 U/L - - -     RADIOGRAPHIC STUDIES: I have personally reviewed the radiological images as listed and agreed with the findings in the report. Dg Chest 1 View  Result Date: 03/18/2016 CLINICAL DATA:  Altered mental status EXAM: CHEST 1 VIEW COMPARISON:  05/09/2014 CXR FINDINGS: Heart is enlarged. The thoracic aorta is tortuous and partially calcified. Mild interstitial prominence is noted, chronic in appearance without pneumonic consolidation, effusion or pneumothorax. Surgical clips project over both humeral heads. Single screw projects over the right scapula and axilla. Spurring is noted off both humeral heads. IMPRESSION: Stable cardiomegaly. No acute pulmonary disease. Aortic atherosclerosis. Electronically Signed   By: Ashley Royalty M.D.   On: 03/18/2016 01:36   Dg Pelvis 1-2 Views  Result Date: 03/18/2016 CLINICAL DATA:  Initial evaluation for acute trauma, fall. EXAM: PELVIS - 1-2 VIEW COMPARISON:  Prior MRI from 01/06/2016. FINDINGS: Postsurgical changes present within the lower lumbar spine and about the right aspect of the sacrum. Bony pelvis intact. SI joints approximated. No pubic diastasis. No acute osseous abnormality about the right hip. Severe degenerative osteoarthritic changes present about the left hip with flattening and sclerosis of the left femoral head, similar to previous, and likely related to degenerative osteoarthritic changes. No acute fracture identified. Visualized soft tissues within normal limits. IMPRESSION: 1. Severe  degenerative osteoarthritic changes about the left hip. Changes are grossly similar as compared to previous MRI from 01/06/2016. No definite superimposed acute fracture or dislocation. If there is high clinical suspicion for possible occult hip fracture, further evaluation with dedicated MRI could be performed as indicated. 2. No other acute osseous abnormality about the pelvis. Electronically Signed   By: Jeannine Boga M.D.   On: 03/18/2016 06:46   Dg Elbow 2 Views Left  Result Date: 03/22/2016 CLINICAL DATA:  Postop ORIF EXAM: LEFT ELBOW - 2 VIEW COMPARISON:  Intraoperative fluoroscopic radiographs dated 03/22/2016 FINDINGS: Compression plate and screw fixation of an olecranon fracture. Fracture fragments are in near anatomic alignment and position. Overlying cast obscures fine osseous detail. IMPRESSION: Status post ORIF of an olecranon fracture, as above. Electronically Signed   By: Julian Hy M.D.   On: 03/22/2016 16:34   Dg Elbow 2 Views Left  Result Date: 03/19/2016 CLINICAL DATA:  History of olecranon fracture status post casting EXAM: LEFT ELBOW -  2 VIEW COMPARISON:  03/17/2016 FINDINGS: Olecranon fracture is again identified with some distraction of the fracture fragments. No other focal abnormality is seen. Casting material is noted posteriorly. IMPRESSION: Stable olecranon fracture Electronically Signed   By: Inez Catalina M.D.   On: 03/19/2016 08:42   Dg Elbow Complete Left  Result Date: 03/22/2016 CLINICAL DATA:  Olecranon fracture due to a fall 03/17/2016. Intraoperative imaging for open reduction and internal fixation. EXAM: LEFT ELBOW - COMPLETE 3+ VIEW; DG C-ARM 61-120 MIN COMPARISON:  Plain films of the left elbow 03/18/2016. FINDINGS: We are provided with 5 fluoroscopic intraoperative spot views of the left elbow. Images demonstrate placement of posterior plate and screws for fixation of an olecranon fracture. Position and alignment are anatomic. Hardware is intact. No  new abnormality. IMPRESSION: ORIF left olecranon fracture.  No acute finding. Electronically Signed   By: Inge Rise M.D.   On: 03/22/2016 15:56   Dg Forearm Left  Result Date: 03/17/2016 CLINICAL DATA:  76 year old female with fall and pain in the left forearm. EXAM: LEFT FOREARM - 2 VIEW COMPARISON:  Left wrist radiograph dated 03/24/2015 FINDINGS: There is a displaced fracture of the proximal ulna involving the olecranon process with approximately 2 cm distraction. There is a a nondisplaced linear fracture in the proximal ulna extending into the articular surface. No other acute fracture identified. The bones are osteopenic. Distal radial fixation plate and screws noted from prior fracture. There is no dislocation. The radiocapitellar alignment is preserved. There are degenerative changes of the carpal bones. There is soft tissue swelling of the elbow and proximal forearm. No radiopaque foreign object or soft tissue gas. IMPRESSION: Displaced fracture of the olecranon process of the ulna with inter articular extension of the fracture. No dislocation. Additional fractures may be present but not evident due to osteopenia. Dedicated radiograph of the elbow or CT is recommended for further evaluation. Electronically Signed   By: Anner Crete M.D.   On: 03/17/2016 22:26   Ct Head Wo Contrast  Result Date: 03/18/2016 CLINICAL DATA:  Status post fall multiple times, with confusion. Concern for head or cervical spine injury. Initial encounter. EXAM: CT HEAD WITHOUT CONTRAST CT CERVICAL SPINE WITHOUT CONTRAST TECHNIQUE: Multidetector CT imaging of the head and cervical spine was performed following the standard protocol without intravenous contrast. Multiplanar CT image reconstructions of the cervical spine were also generated. 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 No evidence of hemorrhage, hydrocephalus, extra-axial collection or mass lesion/mass effect. Prominence of the ventricles and sulci reflects mild cortical volume loss. Scattered periventricular and subcortical white matter change likely reflects small vessel ischemic microangiopathy. The brainstem and fourth ventricle are within normal limits. The basal ganglia are unremarkable in appearance. The cerebral hemispheres demonstrate grossly normal gray-white differentiation. No mass effect or midline shift is seen. Vascular: No hyperdense vessel or unexpected calcification. Skull: There is no evidence of fracture; visualized osseous structures are unremarkable in appearance. Sinuses/Orbits: The orbits are within normal limits. There is mild partial opacification of the sphenoid sinus. The remaining paranasal sinuses and mastoid air cells are well-aerated. Other: No significant soft tissue abnormalities are seen. CT CERVICAL SPINE FINDINGS Alignment: There is grade 1 anterolisthesis of C3 on C4. Underlying facet disease is noted. Skull base and vertebrae: No acute fracture. No primary bone  lesion or focal pathologic process. Soft tissues and spinal canal: No prevertebral fluid or swelling. No visible canal hematoma. Disc levels: Multilevel disc space narrowing is noted along the lower cervical spine, with enlarged anterior disc osteophyte complexes. Mild underlying facet disease is noted. Upper chest: Hypodensities within the thyroid gland measure up to 1.6 cm on the left. The visualized lung apices are grossly clear. Scattered calcification is seen at the left lung apex. Other: No additional soft tissue abnormalities are seen. IMPRESSION: 1. No evidence of traumatic intracranial injury or fracture. 2. No evidence of acute fracture or subluxation along the cervical spine. 3. 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 Would correlate for any associated symptoms. 4. Mild cortical volume loss and scattered small vessel ischemic microangiopathy. 5. Mild degenerative change along the cervical spine, with grade 1 anterolisthesis of C3 on C4. 6. Mild partial opacification of the sphenoid sinus. 7. **An incidental finding of potential clinical significance has been found. Hypodensities within the thyroid gland measure up to 1.6 cm on the left. Consider further evaluation with thyroid ultrasound. If patient is clinically hyperthyroid, consider nuclear medicine thyroid uptake and scan.** Electronically Signed   By: Garald Balding M.D.   On: 03/18/2016 01:35   Ct Cervical Spine Wo Contrast  Result Date: 03/18/2016 CLINICAL DATA:  Status post fall multiple times, with confusion. Concern for head or cervical spine injury. Initial encounter. EXAM: CT HEAD WITHOUT CONTRAST CT CERVICAL SPINE WITHOUT CONTRAST TECHNIQUE: Multidetector CT imaging of the head and cervical spine was performed following the standard protocol without intravenous contrast. Multiplanar CT image reconstructions of the cervical spine were also generated. 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 No evidence of hemorrhage, hydrocephalus, extra-axial collection or mass lesion/mass effect. Prominence of the ventricles and sulci reflects mild cortical volume loss. Scattered periventricular and subcortical white matter change likely reflects small vessel ischemic microangiopathy. The brainstem and fourth ventricle are within normal limits. The basal ganglia are unremarkable in appearance. The cerebral hemispheres demonstrate grossly normal gray-white differentiation. No mass effect or midline shift is seen. Vascular: No hyperdense vessel  or unexpected calcification. Skull: There is no evidence of fracture; visualized osseous structures are unremarkable in appearance. Sinuses/Orbits: The orbits are within normal limits. There is mild partial opacification of the sphenoid sinus. The remaining paranasal sinuses and mastoid air cells are well-aerated. Other: No significant soft tissue abnormalities are seen. CT CERVICAL SPINE FINDINGS Alignment: There is grade 1 anterolisthesis of C3 on C4. Underlying facet disease is noted. Skull base and vertebrae: No acute fracture. No primary bone lesion or focal pathologic process. Soft tissues and spinal canal: No prevertebral fluid or swelling. No visible canal hematoma. Disc levels: Multilevel disc space narrowing is noted along the lower cervical spine, with enlarged anterior disc osteophyte complexes. Mild underlying facet disease is noted. Upper chest: Hypodensities within the thyroid gland measure up to 1.6 cm on the left. The visualized lung apices are grossly clear. Scattered calcification is seen at the left lung apex. Other: No additional soft tissue abnormalities are seen. IMPRESSION: 1. No evidence of traumatic intracranial injury or fracture. 2. No evidence of acute fracture or subluxation along the cervical spine. 3. 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 Would correlate for any associated symptoms. 4. Mild cortical volume loss and scattered small vessel ischemic microangiopathy. 5. Mild degenerative change along the cervical spine, with grade 1 anterolisthesis of C3 on C4. 6. Mild partial opacification of the sphenoid sinus. 7. **An incidental finding of potential clinical significance has been found. Hypodensities within the thyroid gland measure up to 1.6 cm on the left. Consider further evaluation with thyroid ultrasound. If patient is clinically hyperthyroid, consider nuclear medicine thyroid uptake and scan.** Electronically Signed    By: Garald Balding M.D.   On: 03/18/2016 01:35   Mr Brain Wo Contrast  Result Date: 03/18/2016 CLINICAL DATA:  Fall and confusion. EXAM: MRI HEAD WITHOUT CONTRAST TECHNIQUE: Multiplanar, multiecho pulse sequences of the brain and surrounding structures were obtained without intravenous contrast. COMPARISON:  10/07/2003 FINDINGS: Brain: No acute infarction, hemorrhage, hydrocephalus, extra-axial collection or mass lesion. 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 Confluent gliosis in the cerebral white matter attributed to chronic microvascular disease given comorbidities and patient age. These changes have significantly progressed since 2005. Milder microvascular ischemic change in the pons. Normal brain volume. Vascular: Preserved flow voids Skull and upper cervical spine: No acute finding Sinuses/Orbits: Bilateral cataract resection. Chronic fluid and/or mucosal thickening in the right mastoid air cells. Other: Intermittently motion degraded exam which decreases diagnostic sensitivity. IMPRESSION: 1. No acute finding. 2. 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 3. 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 Electronically Signed   By: Monte Fantasia M.D.   On: 03/18/2016 09:23   US Renal  Result Date: 03/18/2016 CLINICAL DATA:  76 year old female with acute renal failure. EXAM: RENAL / URINARY TRACT ULTRASOUND COMPLETE COMPARISON:  None. FINDINGS: Right Kidney: Length: 11 cm. There is increased renal echotexture. No hydronephrosis or echogenic stone. Left Kidney: Length: 11 cm. There is increased renal echogenicity. There is a 1.0 x 0.8 x 1.3 cm cyst in the left kidney. Bladder: Appears normal for degree of bladder distention. IMPRESSION: Increased renal echotexture compatible with underlying chronic medical renal disease. No hydronephrosis or echogenic stone. Electronically Signed   By: Anner Crete M.D.   On: 03/18/2016 03:22   Dg C-arm 1-60 Min  Result Date: 03/22/2016 CLINICAL DATA:  Olecranon fracture due to a fall 03/17/2016. Intraoperative imaging for open reduction and internal fixation. EXAM: LEFT ELBOW - COMPLETE 3+ VIEW; DG C-ARM 61-120 MIN COMPARISON:  Plain films of the left elbow 03/18/2016. FINDINGS: We are provided with 5 fluoroscopic intraoperative spot views of the left elbow. Images demonstrate placement of posterior plate and screws for fixation of an olecranon fracture. Position and alignment are anatomic. Hardware is intact. No new abnormality. IMPRESSION: ORIF left olecranon fracture.  No acute finding. Electronically Signed   By: Inge Rise M.D.   On: 03/22/2016 15:56    ASSESSMENT & PLAN:   76 year old female with  1) Increasing significant thrombocytosis Platelet counts now >1 million Patient has previously had a post-operative left upper extremity DVT in 2014. 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  Patient's rapid increase in platelet counts from her admission platelet counts seem to be temporally related to a reactive process likely related to her infection/UTI, stress of dehydration and acute kidney injury and her recent left upper elbow surgery. Could be a reactive element from Iron deficiency as well.  However given  the fact that she did have elevated platelet counts progressively since May 2014 suggest that it is appropriate to rule out a myeloproliferative neoplasm such as essential thrombocytosis. Plan -Findings of lab results explained in details to the patient. -patient is already on ASA 81mg  po daily which is reasonable to continue -Sed rate, CRP -would check Jak2 V617F mutation, CalR mutation to r/o ET. -would hold on a BM BX at this time. (might consider this if platelet remain elevated 2-3 weeks out from surgery) -would hold off on empiric use of  hydroxyurea in the setting of AKI, recent surgery and infection. -would need rpt CBC with diff and f/u with Dr Irene Limbo at the [facility] in 2-3  weeks after discharge from hospital to followup on blood counts -appropriate VTE px per hospitalist.  2) Normocytic Anemia  Element of iron deficiency with an iron saturation 6%. Ferritin is over estimated at 73 in the setting of acute inflammation from trauma and infection. Low normal B12 levels SPEP no M spike Plan -no indication for PRBC transfusion -would started on B12 1054mcg po daily to maintain B12>500 -Iron polysaccharide 150mg  po daily for iron replacement to maintain ferritin >100 and Iron sat >30% -Serum Kappa/Lambda free light chains  F/u in clinic with Dr Irene Limbo in 2-3 weeks with rpt labs  All of the patients questions were answered with apparent satisfaction. The patient knows to call the clinic with any problems, questions or concerns.  I spent 60 minutes counseling the patient face to face. The total time spent in the appointment was 80 minutes and more than 50% was on counseling and direct patient cares.    Sullivan Lone MD [place] AAHIVMS [facility] Hematology/Oncology Physician [facility]  (Office):       [phone] (Work cell):  [phone] (Fax):           [phone]  03/23/2016 4:45 PM

## 2016-03-23 NOTE — Progress Notes (Signed)
Physical Therapy Treatment Patient Details Name: Shirley [name] MRN: [id number] DOB: Aug 06, 1940 Today's Date: 03/23/2016    History of Present Illness Pt admitted with frequent falls, confusion, UTI, acute renal failure and L olecranon fx. Plan for ORIF of L elbow 1/29. Neuro consult pending, possible subacute CVA. PMH: multiple orthopedic surgeries, tachycardia, neuropathy, depression, fibromyalgia.    PT Comments    Pt now s/p L olecranon ORIF. Pt at significant falls risk as pt unable to use L UE to assist with amb due to L UE NWB. Pt with freq falls at home. Pt's son reports he is going to provide/hire 24/7 assist to both of his parents. Pt to benefit from CIR for aggressive therapy to achieve safe minA level of function for safe transition home.  Follow Up Recommendations  CIR     Equipment Recommendations  None recommended by PT    Recommendations for Other Services Rehab consult     Precautions / Restrictions Precautions Precautions: Fall Precaution Comments: L UE in splint Required Braces or Orthoses: Other Brace/Splint Other Brace/Splint: L UE in splint Restrictions Weight Bearing Restrictions: Yes LUE Weight Bearing: Non weight bearing    Mobility  Bed Mobility Overal bed mobility: Needs Assistance Bed Mobility: Supine to Sit     Supine to sit: Mod assist;Max assist     General bed mobility comments: pt able to bring LEs off EOB but unable to use L UE to assist and required maxA for trunk elevation to get to EOB  Transfers Overall transfer level: Needs assistance Equipment used: 1 person hand held assist Transfers: Sit to/from Stand Sit to Stand: Mod assist         General transfer comment: pt unable to use L UE to assist, modA to power up  Ambulation/Gait Ambulation/Gait assistance: Mod assist Ambulation Distance (Feet): 40 Feet Assistive device: 1 person hand held assist Gait Pattern/deviations: Step-to pattern;Decreased stride length;Wide base of  support;Antalgic Gait velocity: slow Gait velocity interpretation: Below normal speed for age/gender General Gait Details: pt dependent on PT, pt c/o L hip pain, short, shuffled steps, wide base of support and unsteady   Stairs            Wheelchair Mobility    Modified Rankin (Stroke Patients Only)       Balance Overall balance assessment: Needs assistance Sitting-balance support: Feet supported Sitting balance-Leahy Scale: Fair     Standing balance support: During functional activity;Single extremity supported Standing balance-Leahy Scale: Poor                      Cognition Arousal/Alertness: Awake/alert Behavior During Therapy: WFL for tasks assessed/performed Overall Cognitive Status: Within Functional Limits for tasks assessed                      Exercises      General Comments General comments (skin integrity, edema, etc.): worked on finger/wrist ROM adn opening closing of L hand      Pertinent Vitals/Pain Pain Assessment: 0-10 Pain Score: 6  Pain Location: L UE Pain Descriptors / Indicators: Aching Pain Intervention(s): Monitored during session    Home Living                      Prior Function            PT Goals (current goals can now be found in the care plan section) Acute Rehab PT Goals Patient Stated Goal: stop falling Progress towards  PT goals: Progressing toward goals    Frequency    Min 3X/week      PT Plan Current plan remains appropriate    Co-evaluation             End of Session Equipment Utilized During Treatment: Gait belt (L sling) Activity Tolerance: Patient tolerated treatment well Patient left: in chair;with call bell/phone within reach;with family/visitor present     Time: 3295-1884 PT Time Calculation (min) (ACUTE ONLY): 23 min  Charges:  $Gait Training: 8-22 mins $Therapeutic Exercise: 8-22 mins                    G Codes:      Marris Frontera M Soul Hackman 14-Apr-2016, 1:47  PM  Kittie Plater, PT, DPT Pager #: [phone] Office #: [phone]

## 2016-03-23 NOTE — Progress Notes (Signed)
Rehab admissions - I met with patient and she is interested in inpatient rehab.  I have started insurance preauthorization process.  I will follow up tomorrow after I hear back from [facility] insurance case manager.  Call me for questions.  #[phone]

## 2016-03-24 ENCOUNTER — Encounter (HOSPITAL_COMMUNITY): Payer: Self-pay | Admitting: Orthopedic Surgery

## 2016-03-24 DIAGNOSIS — Y92099 Unspecified place in other non-institutional residence as the place of occurrence of the external cause: Secondary | ICD-10-CM

## 2016-03-24 DIAGNOSIS — W19XXXA Unspecified fall, initial encounter: Secondary | ICD-10-CM

## 2016-03-24 DIAGNOSIS — Y92009 Unspecified place in unspecified non-institutional (private) residence as the place of occurrence of the external cause: Secondary | ICD-10-CM

## 2016-03-24 LAB — RENAL FUNCTION PANEL
ANION GAP: 8 (ref 5–15)
Albumin: 2.6 g/dL — ABNORMAL LOW (ref 3.5–5.0)
BUN: 31 mg/dL — AB (ref 6–20)
CO2: 22 mmol/L (ref 22–32)
Calcium: 8.2 mg/dL — ABNORMAL LOW (ref 8.9–10.3)
Chloride: 110 mmol/L (ref 101–111)
Creatinine, Ser: 1.67 mg/dL — ABNORMAL HIGH (ref 0.44–1.00)
GFR, EST AFRICAN AMERICAN: 33 mL/min — AB (ref >60)
GFR, EST NON AFRICAN AMERICAN: 28 mL/min — AB (ref >60)
Glucose, Bld: 116 mg/dL — ABNORMAL HIGH (ref 65–99)
PHOSPHORUS: 2.7 mg/dL (ref 2.5–4.6)
POTASSIUM: 3.7 mmol/L (ref 3.5–5.1)
Sodium: 140 mmol/L (ref 135–145)

## 2016-03-24 LAB — CBC
HCT: 33.9 % — ABNORMAL LOW (ref 36.0–46.0)
Hemoglobin: 10.4 g/dL — ABNORMAL LOW (ref 12.0–15.0)
MCH: 27.5 pg (ref 26.0–34.0)
MCHC: 30.7 g/dL (ref 30.0–36.0)
MCV: 89.7 fL (ref 78.0–100.0)
PLATELETS: 1192 10*3/uL — AB (ref 150–400)
RBC: 3.78 MIL/uL — ABNORMAL LOW (ref 3.87–5.11)
RDW: 15.6 % — AB (ref 11.5–15.5)
WBC: 10.6 10*3/uL — AB (ref 4.0–10.5)

## 2016-03-24 LAB — KAPPA/LAMBDA LIGHT CHAINS
Kappa free light chain: 30.2 mg/L — ABNORMAL HIGH (ref 3.3–19.4)
Kappa, lambda light chain ratio: 1.2 (ref 0.26–1.65)
LAMDA FREE LIGHT CHAINS: 25.2 mg/L (ref 5.7–26.3)

## 2016-03-24 MED ORDER — ASPIRIN 81 MG PO TBEC: 81.0000 mg | DELAYED_RELEASE_TABLET | Freq: Every day | ORAL | 0 refills | Status: DC

## 2016-03-24 MED ORDER — AMLODIPINE BESYLATE 10 MG PO TABS: 10.0000 mg | ORAL_TABLET | Freq: Every day | ORAL | Status: DC

## 2016-03-24 MED ORDER — AMLODIPINE BESYLATE 10 MG PO TABS
10.0000 mg | ORAL_TABLET | Freq: Every day | ORAL | Status: DC
  Administered 2016-03-24: 10 mg via ORAL
  Filled 2016-03-24: qty 1

## 2016-03-24 MED ORDER — ACETAMINOPHEN 325 MG PO TABS: 650.0000 mg | ORAL_TABLET | ORAL | Status: DC | PRN

## 2016-03-24 MED ORDER — METHOCARBAMOL 500 MG PO TABS: 500.0000 mg | ORAL_TABLET | Freq: Three times a day (TID) | ORAL | 0 refills | Status: DC | PRN

## 2016-03-24 MED ORDER — METOPROLOL TARTRATE 25 MG PO TABS
25.0000 mg | ORAL_TABLET | Freq: Two times a day (BID) | ORAL | 0 refills | Status: DC
Start: 2016-03-24 — End: 2019-02-22

## 2016-03-24 MED ORDER — AMLODIPINE BESYLATE 5 MG PO TABS: 5.0000 mg | ORAL_TABLET | Freq: Every day | ORAL | 0 refills | Status: DC

## 2016-03-24 NOTE — Progress Notes (Signed)
Rehab admissions - I have a denial from insurance carrier for acute inpatient rehab admission.  Patient needs can be met at a lower level of care such as SNF.  Recommend pursuit of SNF placement.  Call me for questions.  #[phone]

## 2016-03-24 NOTE — Discharge Summary (Addendum)
Physician Discharge Summary  Shirley [name]:[id number] DOB: 1940/09/14 DOA: 03/17/2016  PCP: Sheral Apley  Admit date: 03/17/2016 Discharge date: 03/24/2016  Admitted From:Home Disposition:home with home care.  Recommendations for Outpatient Follow-up:  1. Follow up with PCP, orthopedics in 1-2 weeks 2. Please obtain BMP/CBC in one week   Home Health:yes Equipment/Devices:LUE sling Discharge Condition:stable CODE STATUS:full code Diet recommendation:heart healthy  Brief/Interim Summary:75 y.o.femalewith history of chronic pain and arthritis was recently placed on indomethacin  was brought to the ER after patient was found to be confused and had a fall. Workup significant for acute renal failure with a creatinine of 6.5, with anion gap of 18, possible UTI, CT with possible subacute occipital infarct, but no evidence of acute infarct on MRI brain,she has left elbow olecranon fracture, seen by orthopedic, underwent surgery on 1/29.  # Acute renal failure: Most likely related with NSAIDs use. UA unremarkable. Ultrasound kidneys consistent with possible chronic medical renal disease. Patient has normal serum creatinine level at baseline, had serum creatinine level of 6.5 on admission. Patient was treated with IV fluid and evaluated by nephrologist. -Serum creatinine level trended down to 1.6 Today. Recommended outpatient lab and follow-up with PCP and may need nephrology referral.  # Left olecranon fracture s/p ORIF on 1/29 by orthopedics.  -Pain is better today. Patient reported that she has pain medications at home. Added Robaxin for muscle relaxation. Inpatient rehabilitation decline admission because of not able to get insurance approval. Patient wanted to go home with home care this time. Discussed with her at bedside. Also discussed with the Education officer, museum. Patient will be discharged with home care including PT, OT, RN and aide. Recommended to follow up with orthopedics  outpatient. Patient will be discharged with sling.   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 Hematology consult appreciated. Recommended outpatient follow-up. I advised patient to follow-up pending lab result with Dr. Irene Limbo at hematology clinic. Continue aspirin.  # Acute encephalopathy: Multifactorial etiology including metabolic encephalopathy in the setting of electrolyte imbalance, renal failure and possibility pain medications. Patient's mental status is back to baseline. She has no focal neurological deficit. - CT showing possible subacute infarct,But MRI brain with no acute findings. -On renal dose of Neurontin.  # UTI, asymptomatic bacteriuria - no pyuria but significant bacteriuria , urine cultures aren't helpful as growing multiple species, treated with 3 days of Rocephin .  # SVT - recent echo done with EF 65%, no regional wall motion abnormalities,  -Continue metoprolol. Recommended outpatient follow-up with cardiologist.  # Hypertension - Blood pressure is suboptimally controlled. Patient is currently on metoprolol twice a day. On lower dose of Norvasc. Apparently patient was not taking Norvasc before admission. Continue to monitor blood pressure home and follow up with PCP.  # Chronic pain syndrome and arthritis - Patient reported she has pain medication at home. Added Tylenol and Robaxin.  # Chronic anemia, iron deficiency anemia.  - Workup significant for low iron and B-12 level, continue with B-12 supplement, received IV iron by renal, Hemoccult negative -Hemoglobin is stable today.  Patient is clinically stable. I recommended follow-up with hematologist, orthopedics, cardiologist and PCP. Patient denied any pain. At this time, patient is medically stable to transfer her care to outpatient.  Discharge Diagnoses:  Principal Problem:   Acute renal failure (HCC) Active Problems:   Chronic pain syndrome   Acute  encephalopathy   Closed fracture of left olecranon process   Fall  Anemia   Post-operative pain   Fibromyalgia   Uncomplicated asthma   Tobacco abuse   AKI (acute kidney injury) (HCC)   Leukocytosis   Other secondary hypertension   Hyperglycemia   Acute blood loss anemia   Thrombocytosis (HCC)   Fall at home, initial encounter    Discharge Instructions  Discharge Instructions    ([place]) Call MD:  Anytime you have any of the following symptoms: 1) 3 pound weight gain in 24 hours or 5 pounds in 1 week 2) shortness of breath, with or without a dry hacking cough 3) swelling in the hands, feet or stomach 4) if you have to sleep on extra pillows at night in order to breathe.    Complete by:  As directed    Call MD for:  difficulty breathing, headache or visual disturbances    Complete by:  As directed    Call MD for:  extreme fatigue    Complete by:  As directed    Call MD for:  hives    Complete by:  As directed    Call MD for:  persistant dizziness or light-headedness    Complete by:  As directed    Call MD for:  persistant nausea and vomiting    Complete by:  As directed    Call MD for:  severe uncontrolled pain    Complete by:  As directed    Call MD for:  temperature >100.4    Complete by:  As directed    Diet - low sodium heart healthy    Complete by:  As directed    Discharge instructions    Complete by:  As directed    NWB L UEx Continue Sling  restricted from active extension for 8 weeks Remain in splint for 2 weeks then begin protected ROM  Please follow up pending lab results with Dr. Irene Limbo.   Face-to-face encounter (required for Medicare/Medicaid patients)    Complete by:  As directed    I Husayn Reim Tanna Furry certify that this patient is under my care and that I, or a nurse practitioner or physician's assistant working with me, had a face-to-face encounter that meets the physician face-to-face encounter  requirements with this patient on 03/24/2016. The encounter with the patient was in whole, or in part for the following medical condition(s) which is the primary reason for home health care (List medical condition): fall, left upper extremity fracture.   The encounter with the patient was in whole, or in part, for the following medical condition, which is the primary reason for home health care:  left hand fracture, fall   I certify that, based on my findings, the following services are medically necessary home health services:   Nursing Physical therapy     Reason for Medically Necessary Home Health Services:  Skilled Nursing- Skilled Assessment/Observation   My clinical findings support the need for the above services:  Pain interferes with ambulation/mobility   Further, I certify that my clinical findings support that this patient is homebound due to:  Pain interferes with ambulation/mobility   Home Health    Complete by:  As directed    To provide the following care/treatments:   PT OT RN Dixon     Increase activity slowly    Complete by:  As directed      Allergies as of 03/24/2016      Reactions   No Known Allergies       Medication List  STOP taking these medications   amphetamine-dextroamphetamine 20 MG tablet Commonly known as:  ADDERALL   cephALEXin 500 MG capsule Commonly known as:  KEFLEX   ibuprofen 200 MG tablet Commonly known as:  ADVIL,MOTRIN     TAKE these medications   acetaminophen 325 MG tablet Commonly known as:  TYLENOL Take 2 tablets (650 mg total) by mouth every 4 (four) hours as needed for mild pain (or temp > 37.5 C (99.5 F)).   ALPRAZolam 1 MG tablet Commonly known as:  XANAX Take 0.5-1 mg by mouth 3 (three) times daily as needed for anxiety or sleep.   amLODipine 5 MG tablet Commonly known as:  NORVASC Take 1 tablet (5 mg total) by mouth daily. What changed:  medication strength  how much to take   aspirin 81 MG EC  tablet Take 1 tablet (81 mg total) by mouth daily. Start taking on:  03/25/2016   cetirizine 10 MG tablet Commonly known as:  ZYRTEC Take 10 mg by mouth daily as needed for allergies.   gabapentin 300 MG capsule Commonly known as:  NEURONTIN Take 300-900 mg by mouth 3 (three) times daily. Take 300mg  in the am and afternoon, and then take 900mg  at night   methocarbamol 500 MG tablet Commonly known as:  ROBAXIN Take 1 tablet (500 mg total) by mouth every 8 (eight) hours as needed for muscle spasms.   metoprolol tartrate 25 MG tablet Commonly known as:  LOPRESSOR Take 1 tablet (25 mg total) by mouth 2 (two) times daily.   oxycodone 30 MG immediate release tablet Commonly known as:  ROXICODONE Take 30 mg by mouth every 4 (four) hours as needed for pain (for pain). Reported on 06/03/2015   polyethylene glycol packet Commonly known as:  MIRALAX / GLYCOLAX Take 17 g by mouth daily as needed for moderate constipation.   sertraline 100 MG tablet Commonly known as:  ZOLOFT Take 100 mg by mouth at bedtime.   torsemide 10 MG tablet Commonly known as:  DEMADEX Take 10 mg by mouth daily. And may take an extra dose if needed for fluid/edema   Vitamin D3 5000 units Caps Take 5,000 Units by mouth 2 (two) times daily.      Follow-up Information    HEDGECOCK,SUZANNE, PA-C. Schedule an appointment as soon as possible for a visit in 1 week(s).   Specialty:  Physician Assistant Contact information: [address] [place] [address] [phone]        Rozanna Box, MD. Schedule an appointment as soon as possible for a visit in 2 week(s).   Specialty:  Orthopedic Surgery Contact information: [place] [address] [phone]        Sullivan Lone, MD. Schedule an appointment as soon as possible for a visit in 1 month(s).   Specialties:  Hematology, Oncology Contact information: [place] [address] [place] Follow up.   Why:  HHRN/PT/OT/aide- arranged- they will call you to set up home visits. Contact information: [place] [address] [phone]          Allergies  Allergen Reactions  . No Known Allergies     Consultations:  Orthopedics  Nephrologist  Hematologist   Procedures/Studies:ORIF   Subjective: Patient was seen and examined at bedside. Patient reported feeling much better. Denies any pain. Denies headache, dizziness, nausea, vomiting, chest pain or shortness of  breath. Wanted to go home with home care.   Discharge Exam: Vitals:   03/24/16 1328 03/24/16 1414  BP: (!) 114/44   Pulse: (!) 120 82  Resp: 20   Temp: 98.6 F (37 C)    Vitals:   03/24/16 0934 03/24/16 0940 03/24/16 1328 03/24/16 1414  BP: (!) 108/40 120/65 (!) 114/44   Pulse: 77  (!) 120 82  Resp:   20   Temp:   98.6 F (37 C)   TempSrc:   Oral   SpO2:   96%   Weight:      Height:        General: Pt is alert, awake, not in acute distress Cardiovascular: RRR, S1/S2 +, no rubs, no gallops Respiratory: CTA bilaterally, no wheezing, no rhonchi Abdominal: Soft, NT, ND, bowel sounds + Extremities: no edema, no cyanosis. Left upper extremities has cast, able to move fingers. Neurologic: Alert, awake, oriented. No focal neurological deficit.   The results of significant diagnostics from this hospitalization (including imaging, microbiology, ancillary and laboratory) are listed below for reference.     Microbiology: Recent Results (from the past 240 hour(s))  Urine culture     Status: Abnormal   Collection Time: 03/18/16 12:52 AM  Result Value Ref Range Status   Specimen Description URINE, RANDOM  Final   Special Requests NONE  Final   Culture MULTIPLE SPECIES PRESENT, SUGGEST RECOLLECTION (A)  Final   Report Status 03/19/2016 FINAL  Final  MRSA PCR Screening     Status: None   Collection Time: 03/20/16 12:23 PM  Result Value  Ref Range Status   MRSA by PCR NEGATIVE NEGATIVE Final    Comment:        The GeneXpert MRSA Assay (FDA approved for NASAL specimens only), is one component of a comprehensive MRSA colonization surveillance program. It is not intended to diagnose MRSA infection nor to guide or monitor treatment for MRSA infections.      Labs: BNP (last 3 results) No results for input(s): BNP in the last 8760 hours. Basic Metabolic Panel:  Recent Labs Lab 03/20/16 0210 03/21/16 0258 03/22/16 0402 03/23/16 0357 03/24/16 0325  NA 138 138 142  140 138 140  K 4.1 5.1 4.0  3.9 3.7 3.7  CL 110 110 112*  111 108 110  CO2 19* 19* 23  23 22 22   GLUCOSE 107* 156* 100*  102* 184* 116*  BUN 64* 55* 43*  43* 37* 31*  CREATININE 4.04* 2.94* 2.20*  2.20* 1.90* 1.67*  CALCIUM 8.0* 8.6* 8.7*  8.7* 8.4* 8.2*  MG  --   --  1.4*  --   --   PHOS  --   --  3.8 3.7 2.7   Liver Function Tests:  Recent Labs Lab 03/17/16 2205 03/18/16 0600 03/22/16 0402 03/23/16 0357 03/24/16 0325  AST 25 18  --   --   --   ALT 14 13*  --   --   --   ALKPHOS 80 63  --   --   --   BILITOT 0.5 0.9  --   --   --   PROT 7.1 5.4*  --   --   --   ALBUMIN 4.2 3.1* 2.6* 2.7* 2.6*   No results for input(s): LIPASE, AMYLASE in the last 168 hours.  Recent Labs Lab 03/18/16 0731  AMMONIA 26   CBC:  Recent Labs Lab 03/20/16 0210 03/21/16 0258 03/22/16 0402 03/23/16 0357 03/24/16 0853  WBC 8.1  8.7 8.6 11.6* 10.6*  HGB 9.3* 10.3* 10.1* 9.9* 10.4*  HCT 29.9* 33.1* 32.0* 32.7* 33.9*  MCV 87.4 87.8 87.0 88.9 89.7  PLT 866* 860* 1,055* 1,242* 1,192*   Cardiac Enzymes:  Recent Labs Lab 03/18/16 0600  CKTOTAL 258*   BNP: Invalid input(s): POCBNP CBG:  Recent Labs Lab 03/22/16 1154  GLUCAP 118*   D-Dimer No results for input(s): DDIMER in the last 72 hours. Hgb A1c No results for input(s): HGBA1C in the last 72 hours. Lipid Profile No results for input(s): CHOL, HDL, LDLCALC, TRIG, CHOLHDL,  LDLDIRECT in the last 72 hours. Thyroid function studies No results for input(s): TSH, T4TOTAL, T3FREE, THYROIDAB in the last 72 hours.  Invalid input(s): FREET3 Anemia work up No results for input(s): VITAMINB12, FOLATE, FERRITIN, TIBC, IRON, RETICCTPCT in the last 72 hours. Urinalysis    Component Value Date/Time   COLORURINE YELLOW 03/18/2016 0052   APPEARANCEUR HAZY (A) 03/18/2016 0052   LABSPEC 1.013 03/18/2016 0052   PHURINE 5.0 03/18/2016 0052   GLUCOSEU NEGATIVE 03/18/2016 0052   HGBUR SMALL (A) 03/18/2016 0052   BILIRUBINUR NEGATIVE 03/18/2016 0052   KETONESUR NEGATIVE 03/18/2016 0052   PROTEINUR NEGATIVE 03/18/2016 0052   NITRITE NEGATIVE 03/18/2016 0052   LEUKOCYTESUR TRACE (A) 03/18/2016 0052   Sepsis Labs Invalid input(s): PROCALCITONIN,  WBC,  LACTICIDVEN Microbiology Recent Results (from the past 240 hour(s))  Urine culture     Status: Abnormal   Collection Time: 03/18/16 12:52 AM  Result Value Ref Range Status   Specimen Description URINE, RANDOM  Final   Special Requests NONE  Final   Culture MULTIPLE SPECIES PRESENT, SUGGEST RECOLLECTION (A)  Final   Report Status 03/19/2016 FINAL  Final  MRSA PCR Screening     Status: None   Collection Time: 03/20/16 12:23 PM  Result Value Ref Range Status   MRSA by PCR NEGATIVE NEGATIVE Final    Comment:        The GeneXpert MRSA Assay (FDA approved for NASAL specimens only), is one component of a comprehensive MRSA colonization surveillance program. It is not intended to diagnose MRSA infection nor to guide or monitor treatment for MRSA infections.      Time coordinating discharge:  31 minutes  SIGNED:   Rosita Fire, MD  [facility] 03/24/2016, 3:02 PM  If 7PM-7AM, please contact night-coverage [email or web address] Password [email or web address]

## 2016-03-24 NOTE — Discharge Instructions (Signed)
NWB L UEx Ice and elevate Continue Sling  ill be restricted from active extension for 8 weeks Remain in splint for 2 weeks then begin protected ROM

## 2016-03-24 NOTE — Progress Notes (Signed)
LCSW met with patient at bedside to discussion disposition.  Plan was for CIR, however insurance declined. LCSW offered other options such as SNF and Home with home health.  Patient verbalized understanding and discussed both options, but reports she would much prefer to go home if possible with home health. She reports she has 3 children 2 sons and a daughter along with grandchildren who also help and can help her if she needs. She feels with home health and some equipment who would prefer that over SNF.  She is going to discuss with her family and follow up around lunch time when daughter arrives.  LCSW did go ahead and submit to insurance in effort to see they would be able to approve. This was approved by [employer] in which if patient wants to go to SNF, she would be able to go.  Will follow up with final plans and disposition.  Lane Hacker, MSW Clinical Social Work: [employer] Coverage for :  [phone]

## 2016-03-24 NOTE — Care Management Note (Signed)
Case Management Note Marvetta Gibbons RN, BSN Unit 2W-Case Manager [phone]  Patient Details  Name: Shirley [name] MRN: [id number] Date of Birth: 1940/09/14  Subjective/Objective:  Pt admitted s/p ORIF of elbow fx                  Action/Plan: PTA pt lived at home with spouse- CIR consulted for possible admission- however insurance denied CIR, CSW followed up for potential STSNF vs home with [facility]- pt has decided to return home with [facility] and family - orders have been placed for HHRN/PT/OT/aide- spoke with pt at bedside- choice offered for [facility] agencies in [facility]- per pt she would like to use [facility]- pt states that she will have good family support- son is looking at hiring private duty- list provided for private duty agencies- pt also reports that she has needed DME- that includes RW, cane, and BSC. Referral called to Santiago Glad with [facility] for [facility] services- referral has been accepted.   Expected Discharge Date:  03/24/16               Expected Discharge Plan:  [place]  In-House Referral:  Clinical Social Work  Discharge planning Services  CM Consult  Post Acute Care Choice:  Home Health Choice offered to:  Patient  DME Arranged:  N/A DME Agency:  NA  HH Arranged:  RN, PT, OT, Nurse's Aide [place] Agency:  [place]  Status of Service:  Completed, signed off  If discussed at [place] of Stay Meetings, dates discussed:    Discharge Disposition: home with home health   Additional Comments:  Dawayne Patricia, RN 03/24/2016, 2:23 PM

## 2016-03-24 NOTE — Progress Notes (Signed)
Occupational Therapy Treatment Patient Details Name: Shirley [name] MRN: [id number] DOB: [birth year] Today's Date: 03/24/2016    History of present illness Pt admitted with frequent falls, confusion, UTI, acute renal failure and L olecranon fx. Plan for ORIF of L elbow 1/29. Neuro consult pending, possible subacute CVA. PMH: multiple orthopedic surgeries, tachycardia, neuropathy, depression, fibromyalgia.Marland Kitchen ORIF L elbow 1/29.    OT comments  Focus of session on pt education regarding LUE AROM, management of LUE, edema control, compensatory techniques for ADL and reducing risk of falls. Written information given. If plan is to DC home, pt will need assistance with all mobility and ADL (24/7 S) as her husband is not cognitively able to safely provide this for her. Pt has a cane that she will have to use with assistance as she is NWB through LUE. Pt verbalized understanding. Will continue to follow to facilitate safe DC home.  Keep LUE elevated on 2  Pillows with hand higher then elbow to reduce dependent edema. Use ice on elbow.  Follow Up Recommendations  Home health OT;Supervision/Assistance - 24 hour ([place])    Equipment Recommendations  None recommended by OT (pt states she has 3in1)    Recommendations for Other Services      Precautions / Restrictions Precautions Precautions: Fall Precaution Comments: L UE in post op splint Restrictions Weight Bearing Restrictions: Yes LUE Weight Bearing: Non weight bearing Other Position/Activity Restrictions: sling  - recommend using when walking          Balance Overall balance assessment: History of Falls                                 ADL                                         General ADL Comments: Educated pt on compensatory techniques for dressing, bathing and sling management. Recommended pt sit to bath and use 3in1 as shower chair once able to bath. Pt verbalized understnading.                                       Cognition   Behavior During Therapy: WFL for tasks assessed/performed Overall Cognitive Status: Within Functional Limits for tasks assessed (at baseline)                       Extremity/Trunk Assessment   L shoulder ROM limited to @ 80 FF - baseline ER limited to @ neutral - baseline Good response to retrograde masage            Exercises Other Exercises Other Exercises: L shoulder AAROM FF; ER, Abd x 10 as tolerated Other Exercises: L wrist and hand ROM Other Exercises: retrograde edema massage L hand   Shoulder Instructions       General Comments      Pertinent Vitals/ Pain       Pain Assessment: Faces Faces Pain Scale: Hurts little more Pain Location: L UE Pain Descriptors / Indicators: Discomfort;Grimacing Pain Intervention(s): Limited activity within patient's tolerance;Repositioned  Home Living  Prior Functioning/Environment              Frequency  Min 3X/week        Progress Toward Goals  OT Goals(current goals can now be found in the care plan section)  Progress towards OT goals: Progressing toward goals  Acute Rehab OT Goals Patient Stated Goal: stop falling OT Goal Formulation: With patient Time For Goal Achievement: 04/02/16 Potential to Achieve Goals: Good ADL Goals Pt Will Perform Grooming: with supervision;standing Pt Will Perform Upper Body Dressing: with supervision Pt Will Perform Lower Body Dressing: with supervision;sitting/lateral leans Pt Will Transfer to Toilet: with supervision;ambulating;bedside commode Pt Will Perform Toileting - Clothing Manipulation and hygiene: with supervision;sit to/from stand Additional ADL Goal #1: Pt will be don and doff L UE sling independently. Additional ADL Goal #2: Pt/family will be independent with LUE A/AAROM for shoulder,wrist and hand  Plan Discharge plan needs to be updated;Frequency  remains appropriate    Co-evaluation                 End of Session     Activity Tolerance Patient tolerated treatment well   Patient Left in chair;with call bell/phone within reach   Nurse Communication Mobility status        Time: 4970-2637 OT Time Calculation (min): 23 min  Charges: OT General Charges $OT Visit: 1 Procedure OT Treatments $Self Care/Home Management : 8-22 mins $Therapeutic Exercise: 8-22 mins  Brei Pociask,HILLARY 03/24/2016, 1:31 PM   [facility], OT/L  [phone] 03/24/2016

## 2016-03-24 NOTE — NC FL2 (Signed)
[place] LEVEL OF CARE SCREENING TOOL     IDENTIFICATION  Patient Name: Shirley [name] Birthdate: [birth year] Sex: female Admission Date (Current Location): 03/17/2016  [facility] and [place] Number:  Herbalist and Address:  [facility] [place], [place] [address] [place], [place] [address]      Provider Number: [id number]  Attending Physician Name and Address:  Rosita Fire, MD  Relative Name and Phone Number:       Current Level of Care: Hospital Recommended Level of Care: [place] Prior Approval Number:    Date Approved/Denied:   PASRR Number:   [id number]  Discharge Plan: SNF    Current Diagnoses: Patient Active Problem List   Diagnosis Date Noted  . Fall   . Anemia   . Post-operative pain   . Fibromyalgia   . Uncomplicated asthma   . Tobacco abuse   . AKI (acute kidney injury) ([place])   . Leukocytosis   . Other secondary hypertension   . Hyperglycemia   . Acute blood loss anemia   . Thrombocytosis ([place])   . Closed olecranon fracture, left, initial encounter   . Acute renal failure ([place]) 03/18/2016  . Acute encephalopathy 03/18/2016  . Elbow fracture, left, closed, initial encounter 03/18/2016  . Acute pain of left hip 12/23/2015  . UTI (urinary tract infection) 12/23/2015  . DNR (do not resuscitate) discussion 12/23/2015  . Palliative care by specialist 12/23/2015  . Chronic pain syndrome 12/23/2015  . Left hip pain 12/23/2015  . Effusion of left hip   . Lytic bone lesions on xray   . Exertional dyspnea 09/07/2012  . Varicose veins of both lower extremities with pain 08/27/2012  . Lower extremity edema 08/27/2012  . Abnormal resting ECG findings - sinus rhythm with PACs, and PAT 08/19/2012  . Aortic systolic murmur on examination 08/19/2012  . PAT (paroxysmal atrial tachycardia) / MAT 08/19/2012    Orientation RESPIRATION BLADDER Height & Weight     Self, Time, Situation, Place  Normal  Continent Weight: 179 lb 9.6 oz (81.5 kg) Height:  5\' 4"  (162.6 cm)  BEHAVIORAL SYMPTOMS/MOOD NEUROLOGICAL BOWEL NUTRITION STATUS      Continent Diet (regular/see dc orders/summary)  AMBULATORY STATUS COMMUNICATION OF NEEDS Skin   Limited Assist Verbally Surgical wounds (Left elbow)                       Personal Care Assistance Level of Assistance  Bathing, Feeding, Dressing Bathing Assistance: Limited assistance Feeding assistance: Independent Dressing Assistance: Limited assistance     Functional Limitations Info  Sight, Hearing, Speech Sight Info: Adequate Hearing Info: Adequate Speech Info: Adequate    SPECIAL CARE FACTORS FREQUENCY  PT (By licensed PT), OT (By licensed OT)     PT Frequency: 5x OT Frequency: 5x            Contractures Contractures Info: Not present    Additional Factors Info  Code Status, Allergies Code Status Info: Full Code Allergies Info: NKA           Current Medications (03/24/2016):  This is the current hospital active medication list Current Facility-Administered Medications  Medication Dose Route Frequency Provider Last Rate Last Dose  . 0.9 %  sodium chloride infusion   Intravenous Continuous Josephine Igo, MD   Stopped at 03/23/16 1600  . acetaminophen (TYLENOL) tablet 650 mg  650 mg Oral Q4H PRN Rise Patience, MD   650 mg at  03/23/16 0825   Or  . acetaminophen (TYLENOL) solution 650 mg  650 mg Per Tube Q4H PRN Rise Patience, MD       Or  . acetaminophen (TYLENOL) suppository 650 mg  650 mg Rectal Q4H PRN Rise Patience, MD      . ALPRAZolam Duanne Moron) tablet 0.5-1 mg  0.5-1 mg Oral TID PRN Rise Patience, MD   1 mg at 03/23/16 2119  . amLODipine (NORVASC) tablet 10 mg  10 mg Oral Daily Dron Tanna Furry, MD   10 mg at 03/24/16 0934  . amphetamine-dextroamphetamine (ADDERALL) tablet 20 mg  20 mg Oral Daily PRN Rise Patience, MD      . aspirin EC tablet 81 mg  81 mg Oral Daily Albertine Patricia, MD    81 mg at 03/24/16 0934  . docusate sodium (COLACE) capsule 100 mg  100 mg Oral Daily Hollace Kinnier [place], PA-C   100 mg at 03/24/16 4496  . gabapentin (NEURONTIN) tablet 300 mg  300 mg Oral QHS Rise Patience, MD   300 mg at 03/23/16 2120  . heparin injection 5,000 Units  5,000 Units Subcutaneous Q8H Rise Patience, MD   5,000 Units at 03/24/16 [phone]  . hydrALAZINE (APRESOLINE) injection 10 mg  10 mg Intravenous Q4H PRN Albertine Patricia, MD   10 mg at 03/23/16 2002  . loratadine (CLARITIN) tablet 10 mg  10 mg Oral Daily Rise Patience, MD   10 mg at 03/24/16 0934  . metoprolol tartrate (LOPRESSOR) tablet 25 mg  25 mg Oral BID Albertine Patricia, MD   25 mg at 03/24/16 0934  . ondansetron (ZOFRAN) tablet 4 mg  4 mg Oral Q6H PRN Rise Patience, MD   4 mg at 03/23/16 1429   Or  . ondansetron (ZOFRAN) injection 4 mg  4 mg Intravenous Q6H PRN Rise Patience, MD   4 mg at 03/23/16 1951  . oxyCODONE (Oxy IR/ROXICODONE) immediate release tablet 10-20 mg  10-20 mg Oral Q6H PRN Ainsley Spinner, PA-C   20 mg at 03/24/16 0934  . polyethylene glycol (MIRALAX / GLYCOLAX) packet 17 g  17 g Oral Daily PRN Dron Tanna Furry, MD   17 g at 03/24/16 0935     Discharge Medications: Please see discharge summary for a list of discharge medications.  Relevant Imaging Results:  Relevant Lab Results:   Additional Information SSN: [id number]  [place]illy [place], [place]

## 2016-03-24 NOTE — Progress Notes (Signed)
Orthopaedic Trauma Service Progress Note  Subjective  Doing better this am Pain better controlled now that she is on pain med dosages closer to home regimen  Decreasing burning pain in L elbow  She does report mild H/A this am   Appetite is good Worked well with therapies yesterday   ROS As above  Objective   BP (!) 185/75 (BP Location: Right Arm)   Pulse 78   Temp 98.2 F (36.8 C) (Oral)   Resp 18   Ht _0  (1.626 m)   Wt 81.5 kg (179 lb 9.6 oz)   SpO2 97%   BMI 30.83 kg/m   Intake/Output      01/30 0701 - 01/31 0700 01/31 0701 - 02/01 0700   P.O. 1180    I.V. (mL/kg) 118.8 (1.5)    IV Piggyback     Total Intake(mL/kg) 1298.8 (15.9)    Urine (mL/kg/hr) 1425 (0.7)    Stool 0 (0)    Blood     Total Output 1425     Net -126.2          Urine Occurrence 1 x    Stool Occurrence 1 x      Labs  Results for DALLY, OSHEL (MRN [id number]) as of 03/24/2016 08:54  Ref. Range 03/24/2016 03:25  Sodium Latest Ref Range: 135 - 145 mmol/L 140  Potassium Latest Ref Range: 3.5 - 5.1 mmol/L 3.7  Chloride Latest Ref Range: 101 - 111 mmol/L 110  CO2 Latest Ref Range: 22 - 32 mmol/L 22  Glucose Latest Ref Range: 65 - 99 mg/dL 116 (H)  BUN Latest Ref Range: 6 - 20 mg/dL 31 (H)  Creatinine Latest Ref Range: 0.44 - 1.00 mg/dL 1.67 (H)  Calcium Latest Ref Range: 8.9 - 10.3 mg/dL 8.2 (L)  Anion gap Latest Ref Range: 5 - 15  8  Phosphorus Latest Ref Range: 2.5 - 4.6 mg/dL 2.7  Albumin Latest Ref Range: 3.5 - 5.0 g/dL 2.6 (L)  EGFR (African American) Latest Ref Range: >60 mL/min 33 (L)  EGFR (Non-African Amer.) Latest Ref Range: >60 mL/min 28 (L)   Results for QUINCEE, GITTENS (MRN [id number]) as of 03/24/2016 08:54  Ref. Range 03/23/2016 19:17  CRP Latest Ref Range: <1.0 mg/dL 6.2 (H)   Results for TONIMARIE, GRITZ (MRN [id number]) as of 03/24/2016 08:54  Ref. Range 03/23/2016 19:17  Sed Rate Latest Ref Range: 0 - 22 mm/hr 58 (H)    Exam  Gen: awake and alert, sitting comfortably  in bedside chair, eating breakfast  Ext:       Left Upper Extremity   Splint c/d/i             swelling improving to L hand              R/U/M motor and sensory functions intact             Ext warm     Assessment and Plan   POD/HD#: 2  76 y/o female with comminuted L olecranon fracture, UTI, chronic pain    -comminuted L olecranon fracture s/p ORIF             NWB L UEx             Ice and elevate             Finger motion and wrist motion ok              Sling  PT/OT             Will be restricted from active extension for 8 weeks             Remain in splint for 2 weeks then begin protected ROM     - Pain management:             continue with current regimen    - Medical issues              Acute renal failure                       continues to improve               Thrombocytosis                         per hematology   CRP and ESR elevated    Could be reflective of chronic inflammation     Would expect ESR to be elevated post op    Additional labs pending    HTN   Headache likely attributable to HTN   PRN hydralazine ordered   Pt on metoprolol    Pt was on norvasc PTA but this has been held, ? If ok to restart at this time   - DVT/PE prophylaxis:             Per medical service              - ID:              completed rocephin for UTI    - Dispo:            PT/OT              Ortho issues stable                  Continue per medical service    SW consult for SNF    Jari Pigg, PA-C [facility] [phone] ([phone] (O) 03/24/2016 8:52 AM

## 2016-03-24 NOTE — Progress Notes (Signed)
Rehab admissions - I explained the denial for acute inpatient rehab admission from [place] to patient.  Patient would now like to consider going home with [facility] therapies.  I will let the case manager know.  Call me for questions. #[phone]

## 2016-03-24 NOTE — Progress Notes (Signed)
Discharged per MD order. IV removed, tele d/c, CCMD notified. AVS went over with patient including follow up appts and medications. No further questions at this time. To home with daughter via car.  Cyndia Bent RN

## 2016-03-25 ENCOUNTER — Telehealth: Payer: Self-pay | Admitting: Hematology

## 2016-03-25 HISTORY — PX: TRANSTHORACIC ECHOCARDIOGRAM: SHX275

## 2016-03-25 NOTE — Telephone Encounter (Signed)
Spoke to the pt's daughter in law who states that the pt was back in the hospital and that they are trying to find a place for her to be closer to her family members. Advised that I call one day next week to schedule an appt. Voiced understanding. Staff msg sent to Dr. Irene Limbo about this situation.

## 2016-03-25 NOTE — Op Note (Signed)
NAMEJERIS, EASTERLY NO.:  [id number]  MEDICAL RECORD NO.:  [id number]  LOCATION:  D33C                         FACILITY:  [facility]  PHYSICIAN:  Astrid Divine. Marcelino Scot, M.D. DATE OF BIRTH:  [birth year]  DATE OF PROCEDURE:  03/22/2016 DATE OF DISCHARGE:                              OPERATIVE REPORT   PREOPERATIVE. DIAGNOSIS:  Displaced comminuted left olecranon fracture.  POSTOPERATIVE DIAGNOSIS:  Displaced comminuted left olecranon fracture.  PROCEDURE:  Open reduction and internal fixation of left olecranon with cancellous autografting.  SURGEON:  Astrid Divine. Marcelino Scot, MD  ASSISTANT:  Ainsley Spinner, PA-C, Hilbert Odor, PA-C.  ANESTHESIA:  General.  COMPLICATIONS:  None.  TOURNIQUET:  None.  DISPOSITION:  To PACU.  CONDITION:  Stable.  BRIEF SUMMARY AND INDICATION FOR PROCEDURE:  Shirley [name] is very pleasant 76 year old female who sustained a displaced left olecranon fracture and a fall.  She had renal insufficiency, mental status changes, and other medical issues that prevented early fixation.  She has been in a splint with elevation and on medical management.  At this time, she is deemed stable for anesthesia.  We did ask the Anesthesia Service to consult on her preoperatively given her substantial routine use of narcotics.  I did discuss with patient and her daughter, the risks and benefits of surgical repair including potential for infection, nerve injury, vessel injury, DVT, PE, heart attack, stroke, symptomatic hardware, need for further surgery, as well as arthritis and loss of motion.  They did wish to proceed.  BRIEF SUMMARY OF PROCEDURE:  Patient was taken to operating room where general anesthesia was induced.  She did receive preoperative antibiotics.  Her left upper extremity was prepped and draped in usual sterile fashion.  A tourniquet was placed about the arm above the sterile field, but was never inflated during the procedure.  A  standard posterior approach was made.  Dissection carried carefully down to the fracture site and the elbow joint.  It was irrigated thoroughly, removing all adherent hematoma as well as any chondral flecks that may have been within the joint space.  There was comminution of the articular surface, which was impacted.  Initially, I attempted to mobilize this toward the articular surface of the trochlear spool and then reduced the fracture, obtained C-arm images that showed persistent impaction.  Consequently, I reopened the fracture site and elevated the articular surface by placing cancellous graft dorsal to it and pushing this anteriorly toward the joint surface, the comminuted segment having been elevated in this manner.  Repeat reduction maneuver was performed and compressed with a sharp tenaculum clamp followed by a provisional K- wire fixation and then ultimately plate placement.  Images showed elevation of the articular surface with appropriate reduction and __________ of the trochlea along the trochlear spool.  The plate was then secured with compression and both standard and locked fixation. Home-run screw was placed into the coronoid as well and final images checked on both AP and lateral views.  Wound was irrigated thoroughly and then closed in standard layered fashion.  A posterior stent was applied.  Patient awakened from anesthesia and transported to PACU in stable condition.  Ainsley Spinner, PA-C  assisted me throughout.  We also had a 2nd assistant, Hilbert Odor, PA-C as well.  PROGNOSIS:  Ms. Standiford prognosis will be related to her ability to maintain compliance with her push-off restriction.  She has multiple orthopedic conditions and consequently will have great temptation to use this prior to healing.  That being said, we anticipate removal of her splint to allow for range of motion within 10 days, __________ against resistance.     Astrid Divine. Marcelino Scot,  M.D.     MHH/MEDQ  D:  03/24/2016  T:  03/25/2016  Job:  [id number]

## 2016-03-26 LAB — CALRETICULIN (CALR) MUTATION ANALYSIS

## 2016-03-28 DIAGNOSIS — D500 Iron deficiency anemia secondary to blood loss (chronic): Secondary | ICD-10-CM | POA: Diagnosis not present

## 2016-03-28 DIAGNOSIS — F17210 Nicotine dependence, cigarettes, uncomplicated: Secondary | ICD-10-CM | POA: Diagnosis not present

## 2016-03-28 DIAGNOSIS — S52025D Nondisplaced fracture of olecranon process without intraarticular extension of left ulna, subsequent encounter for closed fracture with routine healing: Secondary | ICD-10-CM | POA: Diagnosis not present

## 2016-03-28 DIAGNOSIS — Z9181 History of falling: Secondary | ICD-10-CM | POA: Diagnosis not present

## 2016-03-28 DIAGNOSIS — I10 Essential (primary) hypertension: Secondary | ICD-10-CM | POA: Diagnosis not present

## 2016-03-28 DIAGNOSIS — G8929 Other chronic pain: Secondary | ICD-10-CM | POA: Diagnosis not present

## 2016-03-28 DIAGNOSIS — Z86718 Personal history of other venous thrombosis and embolism: Secondary | ICD-10-CM | POA: Diagnosis not present

## 2016-03-28 DIAGNOSIS — R296 Repeated falls: Secondary | ICD-10-CM | POA: Diagnosis not present

## 2016-03-28 DIAGNOSIS — Z96651 Presence of right artificial knee joint: Secondary | ICD-10-CM | POA: Diagnosis not present

## 2016-03-28 DIAGNOSIS — N179 Acute kidney failure, unspecified: Secondary | ICD-10-CM | POA: Diagnosis not present

## 2016-03-28 DIAGNOSIS — F329 Major depressive disorder, single episode, unspecified: Secondary | ICD-10-CM | POA: Diagnosis not present

## 2016-03-28 DIAGNOSIS — M797 Fibromyalgia: Secondary | ICD-10-CM | POA: Diagnosis not present

## 2016-03-29 LAB — JAK2 GENOTYPR

## 2016-03-30 DIAGNOSIS — R609 Edema, unspecified: Secondary | ICD-10-CM | POA: Diagnosis not present

## 2016-03-30 DIAGNOSIS — F329 Major depressive disorder, single episode, unspecified: Secondary | ICD-10-CM | POA: Diagnosis not present

## 2016-03-30 DIAGNOSIS — D500 Iron deficiency anemia secondary to blood loss (chronic): Secondary | ICD-10-CM | POA: Diagnosis not present

## 2016-03-30 DIAGNOSIS — M797 Fibromyalgia: Secondary | ICD-10-CM | POA: Diagnosis not present

## 2016-03-30 DIAGNOSIS — I10 Essential (primary) hypertension: Secondary | ICD-10-CM | POA: Diagnosis not present

## 2016-03-30 DIAGNOSIS — Z9889 Other specified postprocedural states: Secondary | ICD-10-CM | POA: Diagnosis not present

## 2016-03-30 DIAGNOSIS — S52025D Nondisplaced fracture of olecranon process without intraarticular extension of left ulna, subsequent encounter for closed fracture with routine healing: Secondary | ICD-10-CM | POA: Diagnosis not present

## 2016-03-30 DIAGNOSIS — G8929 Other chronic pain: Secondary | ICD-10-CM | POA: Diagnosis not present

## 2016-03-30 DIAGNOSIS — F17210 Nicotine dependence, cigarettes, uncomplicated: Secondary | ICD-10-CM | POA: Diagnosis not present

## 2016-03-30 DIAGNOSIS — Z9181 History of falling: Secondary | ICD-10-CM | POA: Diagnosis not present

## 2016-03-30 DIAGNOSIS — Z86718 Personal history of other venous thrombosis and embolism: Secondary | ICD-10-CM | POA: Diagnosis not present

## 2016-03-30 DIAGNOSIS — N179 Acute kidney failure, unspecified: Secondary | ICD-10-CM | POA: Diagnosis not present

## 2016-03-30 DIAGNOSIS — Z96651 Presence of right artificial knee joint: Secondary | ICD-10-CM | POA: Diagnosis not present

## 2016-03-30 DIAGNOSIS — D649 Anemia, unspecified: Secondary | ICD-10-CM | POA: Diagnosis not present

## 2016-03-30 DIAGNOSIS — R296 Repeated falls: Secondary | ICD-10-CM | POA: Diagnosis not present

## 2016-03-30 DIAGNOSIS — Z87440 Personal history of urinary (tract) infections: Secondary | ICD-10-CM | POA: Diagnosis not present

## 2016-03-30 DIAGNOSIS — R300 Dysuria: Secondary | ICD-10-CM | POA: Diagnosis not present

## 2016-03-31 DIAGNOSIS — D500 Iron deficiency anemia secondary to blood loss (chronic): Secondary | ICD-10-CM | POA: Diagnosis not present

## 2016-03-31 DIAGNOSIS — M797 Fibromyalgia: Secondary | ICD-10-CM | POA: Diagnosis not present

## 2016-03-31 DIAGNOSIS — S52025D Nondisplaced fracture of olecranon process without intraarticular extension of left ulna, subsequent encounter for closed fracture with routine healing: Secondary | ICD-10-CM | POA: Diagnosis not present

## 2016-03-31 DIAGNOSIS — F17210 Nicotine dependence, cigarettes, uncomplicated: Secondary | ICD-10-CM | POA: Diagnosis not present

## 2016-03-31 DIAGNOSIS — Z86718 Personal history of other venous thrombosis and embolism: Secondary | ICD-10-CM | POA: Diagnosis not present

## 2016-03-31 DIAGNOSIS — I10 Essential (primary) hypertension: Secondary | ICD-10-CM | POA: Diagnosis not present

## 2016-03-31 DIAGNOSIS — R296 Repeated falls: Secondary | ICD-10-CM | POA: Diagnosis not present

## 2016-03-31 DIAGNOSIS — N179 Acute kidney failure, unspecified: Secondary | ICD-10-CM | POA: Diagnosis not present

## 2016-03-31 DIAGNOSIS — Z96651 Presence of right artificial knee joint: Secondary | ICD-10-CM | POA: Diagnosis not present

## 2016-03-31 DIAGNOSIS — G8929 Other chronic pain: Secondary | ICD-10-CM | POA: Diagnosis not present

## 2016-03-31 DIAGNOSIS — F329 Major depressive disorder, single episode, unspecified: Secondary | ICD-10-CM | POA: Diagnosis not present

## 2016-03-31 DIAGNOSIS — Z9181 History of falling: Secondary | ICD-10-CM | POA: Diagnosis not present

## 2016-04-01 DIAGNOSIS — G8929 Other chronic pain: Secondary | ICD-10-CM | POA: Diagnosis not present

## 2016-04-01 DIAGNOSIS — F329 Major depressive disorder, single episode, unspecified: Secondary | ICD-10-CM | POA: Diagnosis not present

## 2016-04-01 DIAGNOSIS — Z9181 History of falling: Secondary | ICD-10-CM | POA: Diagnosis not present

## 2016-04-01 DIAGNOSIS — Z96651 Presence of right artificial knee joint: Secondary | ICD-10-CM | POA: Diagnosis not present

## 2016-04-01 DIAGNOSIS — R296 Repeated falls: Secondary | ICD-10-CM | POA: Diagnosis not present

## 2016-04-01 DIAGNOSIS — Z86718 Personal history of other venous thrombosis and embolism: Secondary | ICD-10-CM | POA: Diagnosis not present

## 2016-04-01 DIAGNOSIS — S52025D Nondisplaced fracture of olecranon process without intraarticular extension of left ulna, subsequent encounter for closed fracture with routine healing: Secondary | ICD-10-CM | POA: Diagnosis not present

## 2016-04-01 DIAGNOSIS — F17210 Nicotine dependence, cigarettes, uncomplicated: Secondary | ICD-10-CM | POA: Diagnosis not present

## 2016-04-01 DIAGNOSIS — N179 Acute kidney failure, unspecified: Secondary | ICD-10-CM | POA: Diagnosis not present

## 2016-04-01 DIAGNOSIS — I10 Essential (primary) hypertension: Secondary | ICD-10-CM | POA: Diagnosis not present

## 2016-04-01 DIAGNOSIS — M797 Fibromyalgia: Secondary | ICD-10-CM | POA: Diagnosis not present

## 2016-04-01 DIAGNOSIS — D500 Iron deficiency anemia secondary to blood loss (chronic): Secondary | ICD-10-CM | POA: Diagnosis not present

## 2016-04-05 DIAGNOSIS — Z86718 Personal history of other venous thrombosis and embolism: Secondary | ICD-10-CM | POA: Diagnosis not present

## 2016-04-05 DIAGNOSIS — S52025D Nondisplaced fracture of olecranon process without intraarticular extension of left ulna, subsequent encounter for closed fracture with routine healing: Secondary | ICD-10-CM | POA: Diagnosis not present

## 2016-04-05 DIAGNOSIS — D500 Iron deficiency anemia secondary to blood loss (chronic): Secondary | ICD-10-CM | POA: Diagnosis not present

## 2016-04-05 DIAGNOSIS — R296 Repeated falls: Secondary | ICD-10-CM | POA: Diagnosis not present

## 2016-04-05 DIAGNOSIS — N179 Acute kidney failure, unspecified: Secondary | ICD-10-CM | POA: Diagnosis not present

## 2016-04-05 DIAGNOSIS — G8929 Other chronic pain: Secondary | ICD-10-CM | POA: Diagnosis not present

## 2016-04-05 DIAGNOSIS — F17210 Nicotine dependence, cigarettes, uncomplicated: Secondary | ICD-10-CM | POA: Diagnosis not present

## 2016-04-05 DIAGNOSIS — Z96651 Presence of right artificial knee joint: Secondary | ICD-10-CM | POA: Diagnosis not present

## 2016-04-05 DIAGNOSIS — F329 Major depressive disorder, single episode, unspecified: Secondary | ICD-10-CM | POA: Diagnosis not present

## 2016-04-05 DIAGNOSIS — Z9181 History of falling: Secondary | ICD-10-CM | POA: Diagnosis not present

## 2016-04-05 DIAGNOSIS — I10 Essential (primary) hypertension: Secondary | ICD-10-CM | POA: Diagnosis not present

## 2016-04-05 DIAGNOSIS — M797 Fibromyalgia: Secondary | ICD-10-CM | POA: Diagnosis not present

## 2016-04-07 DIAGNOSIS — S52032D Displaced fracture of olecranon process with intraarticular extension of left ulna, subsequent encounter for closed fracture with routine healing: Secondary | ICD-10-CM | POA: Diagnosis not present

## 2016-04-08 DIAGNOSIS — D500 Iron deficiency anemia secondary to blood loss (chronic): Secondary | ICD-10-CM | POA: Diagnosis not present

## 2016-04-08 DIAGNOSIS — M797 Fibromyalgia: Secondary | ICD-10-CM | POA: Diagnosis not present

## 2016-04-08 DIAGNOSIS — G8929 Other chronic pain: Secondary | ICD-10-CM | POA: Diagnosis not present

## 2016-04-08 DIAGNOSIS — S52025D Nondisplaced fracture of olecranon process without intraarticular extension of left ulna, subsequent encounter for closed fracture with routine healing: Secondary | ICD-10-CM | POA: Diagnosis not present

## 2016-04-08 DIAGNOSIS — F17210 Nicotine dependence, cigarettes, uncomplicated: Secondary | ICD-10-CM | POA: Diagnosis not present

## 2016-04-08 DIAGNOSIS — Z96651 Presence of right artificial knee joint: Secondary | ICD-10-CM | POA: Diagnosis not present

## 2016-04-08 DIAGNOSIS — N179 Acute kidney failure, unspecified: Secondary | ICD-10-CM | POA: Diagnosis not present

## 2016-04-08 DIAGNOSIS — I10 Essential (primary) hypertension: Secondary | ICD-10-CM | POA: Diagnosis not present

## 2016-04-08 DIAGNOSIS — Z86718 Personal history of other venous thrombosis and embolism: Secondary | ICD-10-CM | POA: Diagnosis not present

## 2016-04-08 DIAGNOSIS — R296 Repeated falls: Secondary | ICD-10-CM | POA: Diagnosis not present

## 2016-04-08 DIAGNOSIS — Z9181 History of falling: Secondary | ICD-10-CM | POA: Diagnosis not present

## 2016-04-08 DIAGNOSIS — F329 Major depressive disorder, single episode, unspecified: Secondary | ICD-10-CM | POA: Diagnosis not present

## 2016-04-09 DIAGNOSIS — R296 Repeated falls: Secondary | ICD-10-CM | POA: Diagnosis not present

## 2016-04-09 DIAGNOSIS — Z96651 Presence of right artificial knee joint: Secondary | ICD-10-CM | POA: Diagnosis not present

## 2016-04-09 DIAGNOSIS — Z86718 Personal history of other venous thrombosis and embolism: Secondary | ICD-10-CM | POA: Diagnosis not present

## 2016-04-09 DIAGNOSIS — F329 Major depressive disorder, single episode, unspecified: Secondary | ICD-10-CM | POA: Diagnosis not present

## 2016-04-09 DIAGNOSIS — N179 Acute kidney failure, unspecified: Secondary | ICD-10-CM | POA: Diagnosis not present

## 2016-04-09 DIAGNOSIS — D500 Iron deficiency anemia secondary to blood loss (chronic): Secondary | ICD-10-CM | POA: Diagnosis not present

## 2016-04-09 DIAGNOSIS — G8929 Other chronic pain: Secondary | ICD-10-CM | POA: Diagnosis not present

## 2016-04-09 DIAGNOSIS — I10 Essential (primary) hypertension: Secondary | ICD-10-CM | POA: Diagnosis not present

## 2016-04-09 DIAGNOSIS — F17210 Nicotine dependence, cigarettes, uncomplicated: Secondary | ICD-10-CM | POA: Diagnosis not present

## 2016-04-09 DIAGNOSIS — S52025D Nondisplaced fracture of olecranon process without intraarticular extension of left ulna, subsequent encounter for closed fracture with routine healing: Secondary | ICD-10-CM | POA: Diagnosis not present

## 2016-04-09 DIAGNOSIS — M797 Fibromyalgia: Secondary | ICD-10-CM | POA: Diagnosis not present

## 2016-04-09 DIAGNOSIS — Z9181 History of falling: Secondary | ICD-10-CM | POA: Diagnosis not present

## 2016-04-13 DIAGNOSIS — F329 Major depressive disorder, single episode, unspecified: Secondary | ICD-10-CM | POA: Diagnosis not present

## 2016-04-13 DIAGNOSIS — G8929 Other chronic pain: Secondary | ICD-10-CM | POA: Diagnosis not present

## 2016-04-13 DIAGNOSIS — Z9181 History of falling: Secondary | ICD-10-CM | POA: Diagnosis not present

## 2016-04-13 DIAGNOSIS — F17210 Nicotine dependence, cigarettes, uncomplicated: Secondary | ICD-10-CM | POA: Diagnosis not present

## 2016-04-13 DIAGNOSIS — Z86718 Personal history of other venous thrombosis and embolism: Secondary | ICD-10-CM | POA: Diagnosis not present

## 2016-04-13 DIAGNOSIS — I10 Essential (primary) hypertension: Secondary | ICD-10-CM | POA: Diagnosis not present

## 2016-04-13 DIAGNOSIS — R296 Repeated falls: Secondary | ICD-10-CM | POA: Diagnosis not present

## 2016-04-13 DIAGNOSIS — M797 Fibromyalgia: Secondary | ICD-10-CM | POA: Diagnosis not present

## 2016-04-13 DIAGNOSIS — Z96651 Presence of right artificial knee joint: Secondary | ICD-10-CM | POA: Diagnosis not present

## 2016-04-13 DIAGNOSIS — N179 Acute kidney failure, unspecified: Secondary | ICD-10-CM | POA: Diagnosis not present

## 2016-04-13 DIAGNOSIS — S52025D Nondisplaced fracture of olecranon process without intraarticular extension of left ulna, subsequent encounter for closed fracture with routine healing: Secondary | ICD-10-CM | POA: Diagnosis not present

## 2016-04-13 DIAGNOSIS — D500 Iron deficiency anemia secondary to blood loss (chronic): Secondary | ICD-10-CM | POA: Diagnosis not present

## 2016-04-14 ENCOUNTER — Encounter (HOSPITAL_COMMUNITY): Payer: Self-pay | Admitting: *Deleted

## 2016-04-14 NOTE — Progress Notes (Signed)
Spoke with pt's daughter, [name] for pre-op call. Pt has hx of Paroxysmal atrial tachycardia. Daughter denies any other heart issues for pt. Pt states that the acute renal failure that pt had in January after her fall is all cleared.

## 2016-04-14 NOTE — H&P (Signed)
[facility] H&P/Consult     Chief Complaint: hardware failure L olecranon s/p fall  HPI:   Shirley [name] is an 76 y.o. white female.with complex medical hx, [facility]2016 with acute renal failure from NSAID use and L olecranon fx. Pt underwent ORIF of L olecranon on 03/22/2016 after renal function improved. Pt dc'd home shortly thereafter. She has sustained numerous falls at home. On her first post op follow up at the office her olecranon fragment was noted to be displaced once again. Pt presents today for revision of her olecranon fracture.   Pt is on chronic pain medications. Has had 20+ surgeries over the years. Home medication review from [place] shows oxycodone 30 mg po q4h prn, alprazolam 1 mg po q8h prn, adderall 20 mg po daily. Pt also on gabapentin, zoloft and torsemide   Past Medical History:  Diagnosis Date  . Acute renal failure (ARF) ([place]) 02/2016  . Allergy    vasomotor rhinitis  . Anemia   . Asthma    h/o asthma and bronchitis  . BV (bacterial vaginosis)    history of frequent  . Cervical spondylosis    herniated disk protruding @ C6-7, impinging cord and left axillary root sleeve.  . Chronic pain   . Closed olecranon fracture, left, initial encounter   . Depression   . DVT of upper extremity (deep vein thrombosis) ([place])    h/o left(after wrist fracture/surgery); no residual thrombus or phlebitis by [facility] July 2014  . Dyspnea    with exertion  . Edema   . Fibromyalgia    sees Dr.Phillips-pain management  . GERD (gastroesophageal reflux disease)    h/o  . Headache    marigraine- hormal - none in years  . Hypertension    never has been treated that daughter is aware  . Neuropathy ([place])   . Neuropathy ([place])   . Osteopenia    mild at hip (T-1.3)  . PAT (paroxysmal atrial tachycardia) ([place])   . Pneumonia    years ago  . Varicose veins   . Varicose veins of both legs with edema 08/2012   Also pain; bilateral GSV dilated with  reflux, R Short SV dilated with reflux; not amenable to VNUS ablation due to tortuosity and superficial nature of veins -- recommeded referral to Dr. Eilleen Kempf @ [facility]  . Vision problem    wears glasses  . Vitamin D deficiency     Past Surgical History:  Procedure Laterality Date  . ABDOMINAL HYSTERECTOMY  1999   BSO for endometriosis  . APPENDECTOMY    . BACK SURGERY  age 59   ruptured disk L3-4   . CATARACT EXTRACTION  2000   B/L  . COLONOSCOPY    . DOPPLER ECHOCARDIOGRAPHY  July 2012   Normal EF 60-65% , mild concentric LVH. Grade 2 diastolic dysfunction with elevated EDP. Massive left atrial dilation. Pulmonary pressures 30-40 mm Hg; aortic sclerosis but no stenosis. Moderate TR  . FINGER SURGERY     left finger for tender rupture from fall.  Marland Kitchen FOOT SURGERY     multiple(at least 4 on right, 5 on left)-left Dr.Kerner([place])11/08,left Dr.Nunley-excision 2nd and 3rd mt heads w/artho and hammertoe surgery 4th and 5th 04/2006. left great toe IP fusion and revision of fusion of 2nd through 5th toes 12/2005. Right foot surgeries  Dr.Bednarz 04/2008 and 04/2009.  Marland Kitchen KNEE SURGERY Right    right knee replacement  . NECK SURGERY  2007  cervical  . ORIF ELBOW FRACTURE Left 03/22/2016   Procedure: OPEN REDUCTION INTERNAL FIXATION (ORIF) ELBOW/OLECRANON FRACTURE;  Surgeon: Altamese Rogers, MD;  Location: [facility];  Service: Orthopedics;  Laterality: Left;  . RADIOLOGY WITH ANESTHESIA Left 01/06/2016   Procedure: MRI LEFT HIP WITH AND WITHOUT;  Surgeon: Medication Radiologist, MD;  Location: [facility];  Service: Radiology;  Laterality: Left;  . REFRACTIVE SURGERY  2007   left eye  . SHOULDER SURGERY     x8 (4 on rt side and 4 on left side)  . SPINAL FUSION  6/09   Dr.Cohen  . TONSILLECTOMY AND ADENOIDECTOMY    . WRIST SURGERY Left    2 surgeries left wrist after fracture(complicated by DVT)    Family History  Problem Relation Age of Onset  . Stroke Mother   . Osteoarthritis  Mother     Died at age 78  . Heart disease Father   . Lung cancer Father   . Heart failure Father     Died at age 67  . Osteoporosis Sister   . Diabetes Brother   . Heart attack Brother 60  . Diabetes Brother   . Diabetes Brother   . Heart attack Brother 55  . Breast cancer Paternal Aunt   . Heart failure Maternal Grandmother     Died at age 50, unsure of her heart disease  . Heart failure Paternal Grandfather     Died at age 70, unsure of cardiac disease.   Social History:  reports that she has been smoking.  She has never used smokeless tobacco. She reports that she does not drink alcohol or use drugs.  Allergies:  Allergies  Allergen Reactions  . No Known Allergies     No prescriptions prior to admission.   No current facility-administered medications on file prior to encounter.    Current Outpatient Prescriptions on File Prior to Encounter  Medication Sig Dispense Refill  . acetaminophen (TYLENOL) 325 MG tablet Take 2 tablets (650 mg total) by mouth every 4 (four) hours as needed for mild pain (or temp > 37.5 C (99.5 F)).    Marland Kitchen ALPRAZolam (XANAX) 1 MG tablet Take 0.5-1 mg by mouth 3 (three) times daily as needed for anxiety or sleep.     Marland Kitchen amLODipine (NORVASC) 5 MG tablet Take 1 tablet (5 mg total) by mouth daily. 30 tablet 0  . cetirizine (ZYRTEC) 10 MG tablet Take 10 mg by mouth daily as needed for allergies.     . Cholecalciferol (VITAMIN D3) 5000 UNITS CAPS Take 5,000 Units by mouth 2 (two) times daily.     Marland Kitchen gabapentin (NEURONTIN) 300 MG capsule Take 300-900 mg by mouth 3 (three) times daily. Take 300mg  in the am and afternoon, and then take 900mg  at night    . metoprolol tartrate (LOPRESSOR) 25 MG tablet Take 1 tablet (25 mg total) by mouth 2 (two) times daily. 60 tablet 0  . oxycodone (ROXICODONE) 30 MG immediate release tablet Take 30 mg by mouth every 4 (four) hours as needed for pain (for pain). Reported on 06/03/2015    . sertraline (ZOLOFT) 100 MG tablet Take 100  mg by mouth at bedtime.    . torsemide (DEMADEX) 10 MG tablet Take 20 mg by mouth daily.     Marland Kitchen aspirin EC 81 MG EC tablet Take 1 tablet (81 mg total) by mouth daily. (Patient not taking: Reported on 04/09/2016) 30 tablet 0  . methocarbamol (ROBAXIN) 500 MG tablet Take 1 tablet (  500 mg total) by mouth every 8 (eight) hours as needed for muscle spasms. (Patient not taking: Reported on 04/09/2016) 30 tablet 0     Review of Systems  Constitutional: Negative for chills and fever.  Cardiovascular: Negative for chest pain and palpitations.  Gastrointestinal: Negative for nausea and vomiting.  Musculoskeletal: Positive for falls (frequent ).  Neurological: Negative for tingling and sensory change.    There were no vitals taken for this visit. Physical Exam  Constitutional: She appears well-nourished. She is cooperative.  Cardiovascular: Normal rate and regular rhythm.   Pulmonary/Chest: No accessory muscle usage. No respiratory distress.  Musculoskeletal:  Left Upper extremity     Incision L elbow well healed    Sutures removed in office     Ext warm    + radial pulse     + swelling     Radial, ulnar, median nv motor and sensory functions intact    Palpable defect L olecranon   Neurological: She is alert.  Skin: Skin is warm and intact.      Assessment/Plan  76 y/o female s/p recurrent falls with loss of reduction L olecranon fracture approximately 3 weeks post op   OR for revision of L olecranon fx Admit post op for pain control ?CIR eval post op  Family needs to consider ALF or similar long term  May consider platform walker for stability  Risks and benefits reviewed, pt and family wish to proceed    Jari Pigg, PA-C [facility] [phone] (P) 04/14/2016, 7:30 PM

## 2016-04-15 ENCOUNTER — Inpatient Hospital Stay (HOSPITAL_COMMUNITY): Payer: PPO

## 2016-04-15 ENCOUNTER — Observation Stay (HOSPITAL_COMMUNITY): Payer: PPO

## 2016-04-15 ENCOUNTER — Encounter (HOSPITAL_COMMUNITY)
Admission: RE | Disposition: A | Discharge status: Skilled Nursing Facility | Payer: Self-pay | Source: Ambulatory Visit | Attending: Orthopedic Surgery

## 2016-04-15 ENCOUNTER — Inpatient Hospital Stay (HOSPITAL_COMMUNITY): Payer: PPO | Admitting: Certified Registered Nurse Anesthetist

## 2016-04-15 ENCOUNTER — Observation Stay (HOSPITAL_COMMUNITY)
Admission: RE | Admit: 2016-04-15 | Discharge: 2016-04-17 | Disposition: A | Discharge status: Skilled Nursing Facility | Payer: PPO | Source: Ambulatory Visit | Attending: Orthopedic Surgery | Admitting: Orthopedic Surgery

## 2016-04-15 ENCOUNTER — Encounter (HOSPITAL_COMMUNITY): Payer: Self-pay | Admitting: *Deleted

## 2016-04-15 DIAGNOSIS — I739 Peripheral vascular disease, unspecified: Secondary | ICD-10-CM | POA: Diagnosis not present

## 2016-04-15 DIAGNOSIS — F329 Major depressive disorder, single episode, unspecified: Secondary | ICD-10-CM | POA: Diagnosis not present

## 2016-04-15 DIAGNOSIS — G629 Polyneuropathy, unspecified: Secondary | ICD-10-CM | POA: Diagnosis not present

## 2016-04-15 DIAGNOSIS — J45909 Unspecified asthma, uncomplicated: Secondary | ICD-10-CM | POA: Insufficient documentation

## 2016-04-15 DIAGNOSIS — F419 Anxiety disorder, unspecified: Secondary | ICD-10-CM | POA: Diagnosis not present

## 2016-04-15 DIAGNOSIS — S52023A Displaced fracture of olecranon process without intraarticular extension of unspecified ulna, initial encounter for closed fracture: Secondary | ICD-10-CM | POA: Diagnosis present

## 2016-04-15 DIAGNOSIS — Z720 Tobacco use: Secondary | ICD-10-CM | POA: Diagnosis not present

## 2016-04-15 DIAGNOSIS — E559 Vitamin D deficiency, unspecified: Secondary | ICD-10-CM | POA: Diagnosis present

## 2016-04-15 DIAGNOSIS — M1990 Unspecified osteoarthritis, unspecified site: Secondary | ICD-10-CM | POA: Insufficient documentation

## 2016-04-15 DIAGNOSIS — I10 Essential (primary) hypertension: Secondary | ICD-10-CM | POA: Diagnosis not present

## 2016-04-15 DIAGNOSIS — Z419 Encounter for procedure for purposes other than remedying health state, unspecified: Secondary | ICD-10-CM

## 2016-04-15 DIAGNOSIS — Z86718 Personal history of other venous thrombosis and embolism: Secondary | ICD-10-CM | POA: Insufficient documentation

## 2016-04-15 DIAGNOSIS — K219 Gastro-esophageal reflux disease without esophagitis: Secondary | ICD-10-CM | POA: Insufficient documentation

## 2016-04-15 DIAGNOSIS — M47812 Spondylosis without myelopathy or radiculopathy, cervical region: Secondary | ICD-10-CM | POA: Diagnosis not present

## 2016-04-15 DIAGNOSIS — S52022A Displaced fracture of olecranon process without intraarticular extension of left ulna, initial encounter for closed fracture: Principal | ICD-10-CM | POA: Diagnosis present

## 2016-04-15 DIAGNOSIS — I471 Supraventricular tachycardia: Secondary | ICD-10-CM | POA: Insufficient documentation

## 2016-04-15 DIAGNOSIS — R011 Cardiac murmur, unspecified: Secondary | ICD-10-CM | POA: Diagnosis not present

## 2016-04-15 DIAGNOSIS — Z79891 Long term (current) use of opiate analgesic: Secondary | ICD-10-CM | POA: Insufficient documentation

## 2016-04-15 DIAGNOSIS — R296 Repeated falls: Secondary | ICD-10-CM

## 2016-04-15 DIAGNOSIS — Z472 Encounter for removal of internal fixation device: Secondary | ICD-10-CM | POA: Diagnosis not present

## 2016-04-15 DIAGNOSIS — M25552 Pain in left hip: Secondary | ICD-10-CM | POA: Diagnosis not present

## 2016-04-15 DIAGNOSIS — M1612 Unilateral primary osteoarthritis, left hip: Secondary | ICD-10-CM | POA: Insufficient documentation

## 2016-04-15 DIAGNOSIS — G8929 Other chronic pain: Secondary | ICD-10-CM | POA: Insufficient documentation

## 2016-04-15 DIAGNOSIS — M797 Fibromyalgia: Secondary | ICD-10-CM | POA: Diagnosis not present

## 2016-04-15 DIAGNOSIS — M50223 Other cervical disc displacement at C6-C7 level: Secondary | ICD-10-CM | POA: Insufficient documentation

## 2016-04-15 DIAGNOSIS — W19XXXA Unspecified fall, initial encounter: Secondary | ICD-10-CM | POA: Insufficient documentation

## 2016-04-15 DIAGNOSIS — M8580 Other specified disorders of bone density and structure, unspecified site: Secondary | ICD-10-CM | POA: Diagnosis present

## 2016-04-15 DIAGNOSIS — E669 Obesity, unspecified: Secondary | ICD-10-CM | POA: Insufficient documentation

## 2016-04-15 DIAGNOSIS — Z7982 Long term (current) use of aspirin: Secondary | ICD-10-CM | POA: Insufficient documentation

## 2016-04-15 DIAGNOSIS — S46302A Unspecified injury of muscle, fascia and tendon of triceps, left arm, initial encounter: Secondary | ICD-10-CM | POA: Diagnosis not present

## 2016-04-15 DIAGNOSIS — I480 Paroxysmal atrial fibrillation: Secondary | ICD-10-CM | POA: Diagnosis not present

## 2016-04-15 HISTORY — PX: STERIOD INJECTION: SHX5046

## 2016-04-15 HISTORY — PX: ORIF ELBOW FRACTURE: SHX5031

## 2016-04-15 HISTORY — DX: Pneumonia, unspecified organism: J18.9

## 2016-04-15 HISTORY — DX: Repeated falls: R29.6

## 2016-04-15 HISTORY — DX: Anemia, unspecified: D64.9

## 2016-04-15 LAB — CBC
HEMATOCRIT: 31.1 % — AB (ref 36.0–46.0)
HEMATOCRIT: 31.9 % — AB (ref 36.0–46.0)
HEMOGLOBIN: 9.6 g/dL — AB (ref 12.0–15.0)
Hemoglobin: 9.2 g/dL — ABNORMAL LOW (ref 12.0–15.0)
MCH: 27.5 pg (ref 26.0–34.0)
MCH: 27.8 pg (ref 26.0–34.0)
MCHC: 29.6 g/dL — ABNORMAL LOW (ref 30.0–36.0)
MCHC: 30.1 g/dL (ref 30.0–36.0)
MCV: 92.8 fL (ref 78.0–100.0)
MCV: 92.5 fL (ref 78.0–100.0)
Platelets: 643 10*3/uL — ABNORMAL HIGH (ref 150–400)
Platelets: 668 10*3/uL — ABNORMAL HIGH (ref 150–400)
RBC: 3.35 MIL/uL — ABNORMAL LOW (ref 3.87–5.11)
RBC: 3.45 MIL/uL — AB (ref 3.87–5.11)
RDW: 15.5 % (ref 11.5–15.5)
RDW: 15.3 % (ref 11.5–15.5)
WBC: 6.9 10*3/uL (ref 4.0–10.5)
WBC: 8.8 10*3/uL (ref 4.0–10.5)

## 2016-04-15 LAB — BASIC METABOLIC PANEL
Anion gap: 10 (ref 5–15)
BUN: 26 mg/dL — AB (ref 6–20)
CHLORIDE: 102 mmol/L (ref 101–111)
CO2: 27 mmol/L (ref 22–32)
Calcium: 9.1 mg/dL (ref 8.9–10.3)
Creatinine, Ser: 1.31 mg/dL — ABNORMAL HIGH (ref 0.44–1.00)
GFR calc Af Amer: 44 mL/min — ABNORMAL LOW (ref >60)
GFR calc non Af Amer: 38 mL/min — ABNORMAL LOW (ref >60)
GLUCOSE: 90 mg/dL (ref 65–99)
POTASSIUM: 3.9 mmol/L (ref 3.5–5.1)
Sodium: 139 mmol/L (ref 135–145)

## 2016-04-15 LAB — CREATININE, SERUM
Creatinine, Ser: 1.34 mg/dL — ABNORMAL HIGH (ref 0.44–1.00)
GFR calc Af Amer: 43 mL/min — ABNORMAL LOW (ref >60)
GFR, EST NON AFRICAN AMERICAN: 37 mL/min — AB (ref >60)

## 2016-04-15 LAB — RAPID HIV SCREEN (HIV 1/2 AB+AG)
HIV 1/2 Antibodies: NONREACTIVE
HIV-1 P24 Antigen - HIV24: NONREACTIVE

## 2016-04-15 SURGERY — OPEN REDUCTION INTERNAL FIXATION (ORIF) ELBOW/OLECRANON FRACTURE
Anesthesia: General | Site: Hip | Laterality: Left

## 2016-04-15 MED ORDER — HYDROMORPHONE HCL 1 MG/ML IJ SOLN
INTRAMUSCULAR | Status: AC
  Filled 2016-04-15: qty 1

## 2016-04-15 MED ORDER — GABAPENTIN 300 MG PO CAPS
900.0000 mg | ORAL_CAPSULE | Freq: Every day | ORAL | Status: DC
  Administered 2016-04-15 – 2016-04-16 (×2): 900 mg via ORAL
  Filled 2016-04-15 (×2): qty 3

## 2016-04-15 MED ORDER — LACTATED RINGERS IV SOLN
INTRAVENOUS | Status: DC | PRN
  Administered 2016-04-15 (×2): via INTRAVENOUS

## 2016-04-15 MED ORDER — ACETAMINOPHEN 325 MG PO TABS: 650.0000 mg | ORAL_TABLET | Freq: Four times a day (QID) | ORAL | Status: DC | PRN

## 2016-04-15 MED ORDER — KETAMINE HCL 10 MG/ML IJ SOLN
INTRAMUSCULAR | Status: DC | PRN
  Administered 2016-04-15: 20 mg via INTRAVENOUS
  Administered 2016-04-15: 10 mg via INTRAVENOUS
  Administered 2016-04-15: 30 mg via INTRAVENOUS

## 2016-04-15 MED ORDER — ONDANSETRON HCL 4 MG PO TABS: 4.0000 mg | ORAL_TABLET | Freq: Four times a day (QID) | ORAL | Status: DC | PRN

## 2016-04-15 MED ORDER — LIDOCAINE 2% (20 MG/ML) 5 ML SYRINGE
INTRAMUSCULAR | Status: DC | PRN
  Administered 2016-04-15: 60 mg via INTRAVENOUS

## 2016-04-15 MED ORDER — METOPROLOL TARTRATE 25 MG PO TABS
25.0000 mg | ORAL_TABLET | Freq: Two times a day (BID) | ORAL | Status: DC
  Administered 2016-04-15 – 2016-04-17 (×5): 25 mg via ORAL
  Filled 2016-04-15 (×5): qty 1

## 2016-04-15 MED ORDER — METOCLOPRAMIDE HCL 5 MG PO TABS: 5.0000 mg | ORAL_TABLET | Freq: Three times a day (TID) | ORAL | Status: DC | PRN

## 2016-04-15 MED ORDER — PROPOFOL 10 MG/ML IV BOLUS
INTRAVENOUS | Status: AC
  Filled 2016-04-15: qty 40

## 2016-04-15 MED ORDER — METOCLOPRAMIDE HCL 5 MG/ML IJ SOLN: 5.0000 mg | Freq: Three times a day (TID) | INTRAMUSCULAR | Status: DC | PRN

## 2016-04-15 MED ORDER — FENTANYL CITRATE (PF) 100 MCG/2ML IJ SOLN
INTRAMUSCULAR | Status: AC
  Filled 2016-04-15: qty 4

## 2016-04-15 MED ORDER — MIDAZOLAM HCL 2 MG/2ML IJ SOLN
INTRAMUSCULAR | Status: DC | PRN
  Administered 2016-04-15 (×2): 1 mg via INTRAVENOUS

## 2016-04-15 MED ORDER — MIDAZOLAM HCL 2 MG/2ML IJ SOLN
INTRAMUSCULAR | Status: AC
  Filled 2016-04-15: qty 2

## 2016-04-15 MED ORDER — METOCLOPRAMIDE HCL 5 MG/ML IJ SOLN: 10.0000 mg | Freq: Once | INTRAMUSCULAR | Status: DC | PRN

## 2016-04-15 MED ORDER — SUGAMMADEX SODIUM 200 MG/2ML IV SOLN
INTRAVENOUS | Status: DC | PRN
  Administered 2016-04-15: 50 mg via INTRAVENOUS
  Administered 2016-04-15: 100 mg via INTRAVENOUS

## 2016-04-15 MED ORDER — METHYLPREDNISOLONE ACETATE 80 MG/ML IJ SUSP
INTRAMUSCULAR | Status: DC | PRN
  Administered 2016-04-15: 80 mg

## 2016-04-15 MED ORDER — ONDANSETRON HCL 4 MG/2ML IJ SOLN
INTRAMUSCULAR | Status: AC
  Filled 2016-04-15: qty 12

## 2016-04-15 MED ORDER — CHLORHEXIDINE GLUCONATE 4 % EX LIQD: 60.0000 mL | Freq: Once | CUTANEOUS | Status: DC

## 2016-04-15 MED ORDER — FENTANYL CITRATE (PF) 100 MCG/2ML IJ SOLN: 25.0000 ug | INTRAMUSCULAR | Status: DC | PRN

## 2016-04-15 MED ORDER — OXYCODONE HCL 5 MG PO TABS
30.0000 mg | ORAL_TABLET | ORAL | Status: DC | PRN
  Administered 2016-04-15 – 2016-04-17 (×8): 30 mg via ORAL
  Filled 2016-04-15 (×8): qty 6

## 2016-04-15 MED ORDER — 0.9 % SODIUM CHLORIDE (POUR BTL) OPTIME
TOPICAL | Status: DC | PRN
  Administered 2016-04-15: 1000 mL

## 2016-04-15 MED ORDER — FENTANYL CITRATE (PF) 100 MCG/2ML IJ SOLN
INTRAMUSCULAR | Status: DC | PRN
Start: 2016-04-15 — End: 2016-04-15
  Administered 2016-04-15 (×6): 50 ug via INTRAVENOUS

## 2016-04-15 MED ORDER — OXYCODONE HCL 5 MG PO TABS: 5.0000 mg | ORAL_TABLET | Freq: Once | ORAL | Status: DC | PRN

## 2016-04-15 MED ORDER — IOPAMIDOL (ISOVUE-300) INJECTION 61%
INTRAVENOUS | Status: DC | PRN
  Administered 2016-04-15: 30 mL

## 2016-04-15 MED ORDER — ALPRAZOLAM 0.5 MG PO TABS
0.5000 mg | ORAL_TABLET | Freq: Three times a day (TID) | ORAL | Status: DC | PRN
  Administered 2016-04-15 – 2016-04-17 (×4): 1 mg via ORAL
  Filled 2016-04-15 (×4): qty 2

## 2016-04-15 MED ORDER — CEFAZOLIN SODIUM-DEXTROSE 2-4 GM/100ML-% IV SOLN
2.0000 g | INTRAVENOUS | Status: AC
  Administered 2016-04-15: 2 g via INTRAVENOUS
  Filled 2016-04-15: qty 100

## 2016-04-15 MED ORDER — HYDROCODONE-ACETAMINOPHEN 5-325 MG PO TABS
1.0000 | ORAL_TABLET | Freq: Four times a day (QID) | ORAL | Status: DC | PRN
  Administered 2016-04-16: 2 via ORAL
  Filled 2016-04-15 (×3): qty 2

## 2016-04-15 MED ORDER — ACETAMINOPHEN 10 MG/ML IV SOLN
INTRAVENOUS | Status: AC
  Filled 2016-04-15: qty 100

## 2016-04-15 MED ORDER — POTASSIUM CHLORIDE IN NACL 20-0.9 MEQ/L-% IV SOLN
INTRAVENOUS | Status: DC
  Administered 2016-04-15: 13:00:00 via INTRAVENOUS
  Filled 2016-04-15: qty 1000

## 2016-04-15 MED ORDER — ONDANSETRON HCL 4 MG/2ML IJ SOLN: 4.0000 mg | Freq: Four times a day (QID) | INTRAMUSCULAR | Status: DC | PRN

## 2016-04-15 MED ORDER — BUPIVACAINE HCL (PF) 0.5 % IJ SOLN
INTRAMUSCULAR | Status: AC
  Filled 2016-04-15: qty 30

## 2016-04-15 MED ORDER — GABAPENTIN 300 MG PO CAPS
300.0000 mg | ORAL_CAPSULE | Freq: Two times a day (BID) | ORAL | Status: DC
  Administered 2016-04-15 – 2016-04-17 (×4): 300 mg via ORAL
  Filled 2016-04-15 (×4): qty 1

## 2016-04-15 MED ORDER — IOPAMIDOL (ISOVUE-300) INJECTION 61%
INTRAVENOUS | Status: AC
  Filled 2016-04-15: qty 50

## 2016-04-15 MED ORDER — CEFAZOLIN IN D5W 1 GM/50ML IV SOLN
1.0000 g | Freq: Four times a day (QID) | INTRAVENOUS | Status: AC
  Administered 2016-04-15 – 2016-04-16 (×3): 1 g via INTRAVENOUS
  Filled 2016-04-15 (×3): qty 50

## 2016-04-15 MED ORDER — PROPOFOL 10 MG/ML IV BOLUS
INTRAVENOUS | Status: DC | PRN
  Administered 2016-04-15: 100 mg via INTRAVENOUS

## 2016-04-15 MED ORDER — HYDROMORPHONE HCL 1 MG/ML IJ SOLN
INTRAMUSCULAR | Status: DC | PRN
  Administered 2016-04-15 (×4): 0.5 mg via INTRAVENOUS
  Administered 2016-04-15: 1 mg via INTRAVENOUS

## 2016-04-15 MED ORDER — FLUTICASONE PROPIONATE 50 MCG/ACT NA SUSP
2.0000 | Freq: Every day | NASAL | Status: DC | PRN
  Administered 2016-04-16: 2 via NASAL
  Filled 2016-04-15: qty 16

## 2016-04-15 MED ORDER — FENTANYL CITRATE (PF) 100 MCG/2ML IJ SOLN
INTRAMUSCULAR | Status: AC
  Filled 2016-04-15: qty 2

## 2016-04-15 MED ORDER — MEPERIDINE HCL 25 MG/ML IJ SOLN: 6.2500 mg | INTRAMUSCULAR | Status: DC | PRN

## 2016-04-15 MED ORDER — ENOXAPARIN SODIUM 40 MG/0.4ML ~~LOC~~ SOLN
40.0000 mg | SUBCUTANEOUS | Status: DC
  Administered 2016-04-16 – 2016-04-17 (×2): 40 mg via SUBCUTANEOUS
  Filled 2016-04-15 (×2): qty 0.4

## 2016-04-15 MED ORDER — VITAMIN D 1000 UNITS PO TABS
5000.0000 [IU] | ORAL_TABLET | Freq: Two times a day (BID) | ORAL | Status: DC
  Administered 2016-04-15 – 2016-04-17 (×4): 5000 [IU] via ORAL
  Filled 2016-04-15 (×4): qty 5

## 2016-04-15 MED ORDER — OXYCODONE HCL 5 MG/5ML PO SOLN: 5.0000 mg | Freq: Once | ORAL | Status: DC | PRN

## 2016-04-15 MED ORDER — ACETAMINOPHEN 650 MG RE SUPP: 650.0000 mg | Freq: Four times a day (QID) | RECTAL | Status: DC | PRN

## 2016-04-15 MED ORDER — ONDANSETRON HCL 4 MG/2ML IJ SOLN
INTRAMUSCULAR | Status: DC | PRN
  Administered 2016-04-15: 4 mg via INTRAVENOUS

## 2016-04-15 MED ORDER — CEFAZOLIN SODIUM 1 G IJ SOLR
INTRAMUSCULAR | Status: AC
  Filled 2016-04-15: qty 40

## 2016-04-15 MED ORDER — ROCURONIUM BROMIDE 10 MG/ML (PF) SYRINGE
PREFILLED_SYRINGE | INTRAVENOUS | Status: DC | PRN
  Administered 2016-04-15: 40 mg via INTRAVENOUS

## 2016-04-15 MED ORDER — SERTRALINE HCL 100 MG PO TABS
100.0000 mg | ORAL_TABLET | Freq: Every day | ORAL | Status: DC
  Administered 2016-04-15 – 2016-04-16 (×2): 100 mg via ORAL
  Filled 2016-04-15 (×2): qty 1

## 2016-04-15 MED ORDER — METHYLPREDNISOLONE ACETATE 80 MG/ML IJ SUSP
INTRAMUSCULAR | Status: AC
  Filled 2016-04-15: qty 1

## 2016-04-15 MED ORDER — KETAMINE HCL-SODIUM CHLORIDE 100-0.9 MG/10ML-% IV SOSY
PREFILLED_SYRINGE | INTRAVENOUS | Status: AC
  Filled 2016-04-15: qty 10

## 2016-04-15 MED ORDER — BUPIVACAINE HCL (PF) 0.5 % IJ SOLN
INTRAMUSCULAR | Status: DC | PRN
  Administered 2016-04-15: 1 mL

## 2016-04-15 SURGICAL SUPPLY — 95 items
APL SKNCLS STERI-STRIP NONHPOA (GAUZE/BANDAGES/DRESSINGS)
BANDAGE ACE 4X5 VEL STRL LF (GAUZE/BANDAGES/DRESSINGS) ×2 IMPLANT
BANDAGE ACE 6X5 VEL STRL LF (GAUZE/BANDAGES/DRESSINGS) ×2 IMPLANT
BANDAGE ELASTIC 3 VELCRO ST LF (GAUZE/BANDAGES/DRESSINGS) ×2 IMPLANT
BANDAGE ELASTIC 4 VELCRO ST LF (GAUZE/BANDAGES/DRESSINGS) ×3 IMPLANT
BANDAGE ESMARK 6X9 LF (GAUZE/BANDAGES/DRESSINGS) ×2 IMPLANT
BENZOIN TINCTURE PRP APPL 2/3 (GAUZE/BANDAGES/DRESSINGS) IMPLANT
BLADE AVERAGE 25MMX9MM (BLADE)
BLADE AVERAGE 25X9 (BLADE) ×2 IMPLANT
BLADE SURG 10 STRL SS (BLADE) IMPLANT
BLADE SURG ROTATE 9660 (MISCELLANEOUS) ×2 IMPLANT
BNDG CMPR 9X4 STRL LF SNTH (GAUZE/BANDAGES/DRESSINGS) ×3
BNDG CMPR 9X6 STRL LF SNTH (GAUZE/BANDAGES/DRESSINGS)
BNDG COHESIVE 4X5 TAN STRL (GAUZE/BANDAGES/DRESSINGS) ×5 IMPLANT
BNDG COHESIVE 6X5 TAN STRL LF (GAUZE/BANDAGES/DRESSINGS) ×2 IMPLANT
BNDG ESMARK 4X9 LF (GAUZE/BANDAGES/DRESSINGS) ×5 IMPLANT
BNDG ESMARK 6X9 LF (GAUZE/BANDAGES/DRESSINGS)
BNDG GAUZE ELAST 4 BULKY (GAUZE/BANDAGES/DRESSINGS) ×4 IMPLANT
BRUSH SCRUB DISP (MISCELLANEOUS) ×10 IMPLANT
CLEANER TIP ELECTROSURG 2X2 (MISCELLANEOUS) ×5 IMPLANT
CLOSURE WOUND 1/2 X4 (GAUZE/BANDAGES/DRESSINGS)
COVER SURGICAL LIGHT HANDLE (MISCELLANEOUS) ×7 IMPLANT
CUFF TOURNIQUET SINGLE 18IN (TOURNIQUET CUFF) IMPLANT
CUFF TOURNIQUET SINGLE 24IN (TOURNIQUET CUFF) IMPLANT
CUFF TOURNIQUET SINGLE 34IN LL (TOURNIQUET CUFF) IMPLANT
DECANTER SPIKE VIAL GLASS SM (MISCELLANEOUS) IMPLANT
DRAPE C-ARM 42X72 X-RAY (DRAPES) ×3 IMPLANT
DRAPE C-ARMOR (DRAPES) ×5 IMPLANT
DRAPE INCISE IOBAN 66X45 STRL (DRAPES) IMPLANT
DRAPE U-SHAPE 47X51 STRL (DRAPES) ×5 IMPLANT
DRSG ADAPTIC 3X8 NADH LF (GAUZE/BANDAGES/DRESSINGS) ×2 IMPLANT
DRSG EMULSION OIL 3X3 NADH (GAUZE/BANDAGES/DRESSINGS) IMPLANT
DRSG MEPITEL 4X7.2 (GAUZE/BANDAGES/DRESSINGS) ×3 IMPLANT
ELECT REM PT RETURN 9FT ADLT (ELECTROSURGICAL) ×5
ELECTRODE REM PT RTRN 9FT ADLT (ELECTROSURGICAL) ×3 IMPLANT
FACESHIELD WRAPAROUND (MASK) IMPLANT
FACESHIELD WRAPAROUND OR TEAM (MASK) IMPLANT
GAUZE SPONGE 4X4 12PLY STRL (GAUZE/BANDAGES/DRESSINGS) ×5 IMPLANT
GAUZE XEROFORM 1X8 LF (GAUZE/BANDAGES/DRESSINGS) ×2 IMPLANT
GAUZE XEROFORM 5X9 LF (GAUZE/BANDAGES/DRESSINGS) ×2 IMPLANT
GLOVE BIO SURGEON STRL SZ7.5 (GLOVE) ×5 IMPLANT
GLOVE BIO SURGEON STRL SZ8 (GLOVE) ×5 IMPLANT
GLOVE BIOGEL PI IND STRL 7.5 (GLOVE) ×3 IMPLANT
GLOVE BIOGEL PI IND STRL 8 (GLOVE) ×3 IMPLANT
GLOVE BIOGEL PI INDICATOR 7.5 (GLOVE) ×2
GLOVE BIOGEL PI INDICATOR 8 (GLOVE) ×2
GOWN STRL REUS W/ TWL LRG LVL3 (GOWN DISPOSABLE) ×6 IMPLANT
GOWN STRL REUS W/ TWL XL LVL3 (GOWN DISPOSABLE) ×3 IMPLANT
GOWN STRL REUS W/TWL LRG LVL3 (GOWN DISPOSABLE) ×10
GOWN STRL REUS W/TWL XL LVL3 (GOWN DISPOSABLE) ×5
KIT BASIN OR (CUSTOM PROCEDURE TRAY) ×5 IMPLANT
KIT ROOM TURNOVER OR (KITS) ×5 IMPLANT
MANIFOLD NEPTUNE II (INSTRUMENTS) ×2 IMPLANT
NDL 18GX1X1/2 (RX/OR ONLY) (NEEDLE) IMPLANT
NDL MAYO TROCAR (NEEDLE) IMPLANT
NDL SPNL 18GX3.5 QUINCKE PK (NEEDLE) IMPLANT
NEEDLE 18GX1X1/2 (RX/OR ONLY) (NEEDLE) ×5 IMPLANT
NEEDLE 22X1 1/2 (OR ONLY) (NEEDLE) IMPLANT
NEEDLE MAYO TROCAR (NEEDLE) ×5 IMPLANT
NEEDLE SPNL 18GX3.5 QUINCKE PK (NEEDLE) ×5 IMPLANT
NS IRRIG 1000ML POUR BTL (IV SOLUTION) ×5 IMPLANT
PACK ORTHO EXTREMITY (CUSTOM PROCEDURE TRAY) ×5 IMPLANT
PAD ARMBOARD 7.5X6 YLW CONV (MISCELLANEOUS) ×10 IMPLANT
PAD CAST 4YDX4 CTTN HI CHSV (CAST SUPPLIES) ×4 IMPLANT
PADDING CAST COTTON 4X4 STRL (CAST SUPPLIES) ×10
PADDING CAST COTTON 6X4 STRL (CAST SUPPLIES) ×9 IMPLANT
SPLINT PLASTER CAST XFAST 5X30 (CAST SUPPLIES) ×1 IMPLANT
SPLINT PLASTER XFAST SET 5X30 (CAST SUPPLIES) ×2
SPONGE LAP 18X18 X RAY DECT (DISPOSABLE) ×7 IMPLANT
SPONGE SCRUB IODOPHOR (GAUZE/BANDAGES/DRESSINGS) ×5 IMPLANT
STAPLER VISISTAT 35W (STAPLE) ×5 IMPLANT
STOCKINETTE IMPERVIOUS LG (DRAPES) ×5 IMPLANT
STRIP CLOSURE SKIN 1/2X4 (GAUZE/BANDAGES/DRESSINGS) IMPLANT
SUCTION FRAZIER HANDLE 10FR (MISCELLANEOUS) ×2
SUCTION TUBE FRAZIER 10FR DISP (MISCELLANEOUS) ×3 IMPLANT
SUT ETHILON 3 0 PS 1 (SUTURE) ×9 IMPLANT
SUT FIBERWIRE #2 38 T-5 BLUE (SUTURE) ×15
SUT PDS AB 2-0 CT1 27 (SUTURE) IMPLANT
SUT PROLENE 3 0 PS 2 (SUTURE) ×4 IMPLANT
SUT VIC AB 0 CT1 27 (SUTURE) ×5
SUT VIC AB 0 CT1 27XBRD ANBCTR (SUTURE) ×5 IMPLANT
SUT VIC AB 2-0 CT1 27 (SUTURE) ×5
SUT VIC AB 2-0 CT1 TAPERPNT 27 (SUTURE) ×5 IMPLANT
SUT VIC AB 2-0 CT3 27 (SUTURE) IMPLANT
SUTURE FIBERWR #2 38 T-5 BLUE (SUTURE) ×3 IMPLANT
SYR 30ML LL (SYRINGE) ×3 IMPLANT
SYR 3ML LL SCALE MARK (SYRINGE) ×3 IMPLANT
SYR CONTROL 10ML LL (SYRINGE) ×5 IMPLANT
TOWEL OR 17X24 6PK STRL BLUE (TOWEL DISPOSABLE) ×10 IMPLANT
TOWEL OR 17X26 10 PK STRL BLUE (TOWEL DISPOSABLE) ×10 IMPLANT
TUBE CONNECTING 12'X1/4 (SUCTIONS) ×1
TUBE CONNECTING 12X1/4 (SUCTIONS) ×4 IMPLANT
UNDERPAD 30X30 (UNDERPADS AND DIAPERS) ×2 IMPLANT
WATER STERILE IRR 1000ML POUR (IV SOLUTION) ×4 IMPLANT
YANKAUER SUCT BULB TIP NO VENT (SUCTIONS) ×5 IMPLANT

## 2016-04-15 NOTE — Anesthesia Postprocedure Evaluation (Signed)
Anesthesia Post Note  Patient: MARLAYNA BANNISTER  Procedure(s) Performed: Procedure(s) (LRB): OPEN REDUCTION INTERNAL FIXATION (ORIF) LEFT ELBOW/OLECRANON FRACTURE (Left) LEFT HIP STEROID INJECTION (Left)  Patient location during evaluation: PACU Anesthesia Type: General Level of consciousness: awake and alert and sedated Pain management: pain level controlled Vital Signs Assessment: post-procedure vital signs reviewed and stable Respiratory status: spontaneous breathing, nonlabored ventilation, respiratory function stable and patient connected to nasal cannula oxygen Cardiovascular status: blood pressure returned to baseline and stable Postop Assessment: no signs of nausea or vomiting Anesthetic complications: no       Last Vitals:  Vitals:   04/15/16 1037 04/15/16 1051  BP: (!) 169/81   Pulse: 66 (!) 59  Resp: 15 18  Temp:  36.7 C    Last Pain:  Vitals:   04/15/16 1051  TempSrc:   PainSc: 3                  Niaja Stickley A.

## 2016-04-15 NOTE — Transfer of Care (Signed)
Immediate Anesthesia Transfer of Care Note  Patient: Shirley [name]  Procedure(s) Performed: Procedure(s): OPEN REDUCTION INTERNAL FIXATION (ORIF) LEFT ELBOW/OLECRANON FRACTURE (Left) LEFT HIP STEROID INJECTION (Left)  Patient Location: PACU  Anesthesia Type:General  Level of Consciousness: awake, alert , oriented and patient cooperative  Airway & Oxygen Therapy: Patient Spontanous Breathing and Patient connected to nasal cannula oxygen  Post-op Assessment: Report given to RN and Post -op Vital signs reviewed and stable  Post vital signs: Reviewed and stable  Last Vitals:  Vitals:   04/15/16 0633  BP: (!) 173/68  Pulse: (!) 55  Resp: 20  Temp: 36.9 C    Last Pain:  Vitals:   04/15/16 0633  TempSrc: Oral         Complications: No apparent anesthesia complications

## 2016-04-15 NOTE — Anesthesia Preprocedure Evaluation (Signed)
Anesthesia Evaluation  Patient identified by MRN, date of birth, ID band Patient awake    Reviewed: Allergy & Precautions, NPO status , Patient's Chart, lab work & pertinent test results  Airway Mallampati: II  TM Distance: >3 FB Neck ROM: Full    Dental no notable dental hx. (+) Teeth Intact   Pulmonary shortness of breath and with exertion, asthma , pneumonia, resolved, Current Smoker,    Pulmonary exam normal breath sounds clear to auscultation       Cardiovascular hypertension, Pt. 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 Etiology    Anesthesia Other Findings   Reproductive/Obstetrics                               Chemistry      Component Value Date/Time   NA 140 03/24/2016 0325   K 3.7 03/24/2016 0325   CL 110 03/24/2016 0325   CO2 22 03/24/2016 0325   BUN 31 (H) 03/24/2016 0325   CREATININE 1.67 (H) 03/24/2016 0325   CREATININE 0.66 07/19/2012 1651      Component Value Date/Time   CALCIUM 8.2 (L) 03/24/2016 0325   ALKPHOS 63 03/18/2016 0600   AST 18 03/18/2016 0600   ALT 13 (L) 03/18/2016 0600   BILITOT 0.9 03/18/2016 0600     Lab Results  Component Value Date   WBC 10.6 (H) 03/24/2016   HGB 10.4 (L) 03/24/2016   HCT 33.9 (L) 03/24/2016   MCV 89.7 03/24/2016   PLT 1,192 ([place]) 03/24/2016   EKG: normal sinus rhythm with sinus  arrhythmia.  Anesthesia Physical  Anesthesia Plan  ASA: III  Anesthesia Plan: General   Post-op Pain Management:    Induction: Intravenous  Airway Management Planned: Oral ETT  Additional Equipment:   Intra-op Plan:   Post-operative Plan: Extubation in OR  Informed Consent: I have reviewed the patients History and Physical, chart, labs and discussed the procedure including the risks, benefits and alternatives for the proposed anesthesia with the patient or authorized representative who has indicated his/her understanding and acceptance.   Dental advisory given  Plan Discussed with: Anesthesiologist, CRNA and Surgeon  Anesthesia Plan Comments:         Anesthesia Quick Evaluation

## 2016-04-15 NOTE — Anesthesia Procedure Notes (Signed)
Procedure Name: Intubation Date/Time: 04/15/2016 8:10 AM Performed by: Mervyn Gay Pre-anesthesia Checklist: Patient identified, Patient being monitored, Timeout performed, Emergency Drugs available and Suction available Patient Re-evaluated:Patient Re-evaluated prior to inductionOxygen Delivery Method: Circle System Utilized Preoxygenation: Pre-oxygenation with 100% oxygen Intubation Type: IV induction Ventilation: Mask ventilation without difficulty Laryngoscope Size: Miller and 3 Grade View: Grade I Tube type: Oral Tube size: 7.0 mm Number of attempts: 1 Airway Equipment and Method: Stylet Placement Confirmation: ETT inserted through vocal cords under direct vision,  positive ETCO2 and breath sounds checked- equal and bilateral Secured at: 21 cm Tube secured with: Tape Dental Injury: Teeth and Oropharynx as per pre-operative assessment

## 2016-04-15 NOTE — Care Management Obs Status (Signed)
[place] NOTIFICATION   Patient Details  Name: Shirley [name] MRN: [id number] Date of Birth: 12-04-1940   Medicare Observation Status Notification Given:  Yes    Carles Collet, RN 04/15/2016, 4:35 PM

## 2016-04-15 NOTE — Care Management Note (Signed)
Case Management Note  Patient Details  Name: VIRIDIANA SPAID MRN: [id number] Date of Birth: 1941/01/26  Subjective/Objective:  76 y.o. F seen at the request of daughter at bedside to review forms from [employer]. Pt has suffered multiple falls and required Repair L elbow Fx she sustained after most recent fall. Lives within minutes of adult daughter with spouse.                   Action/Plan: await PT evaluation. May need STSNF for rehab   Expected Discharge Date:                  Expected Discharge Plan:  [place]  In-House Referral:  Clinical Social Work  Discharge planning Services  CM Consult  Post Acute Care Choice:  Home Health Choice offered to:  Patient, Adult Children  DME Arranged:   (Has 3n1, RW) DME Agency:  NA  HH Arranged:  PT, OT HH Agency:  [facility] (prior to admission)  Status of Service:  In process, will continue to follow  If discussed at Long Length of Stay Meetings, dates discussed:    Additional Comments:  Delrae Sawyers, RN 04/15/2016, 12:57 PM

## 2016-04-16 ENCOUNTER — Encounter (HOSPITAL_COMMUNITY): Payer: Self-pay | Admitting: Orthopedic Surgery

## 2016-04-16 DIAGNOSIS — I10 Essential (primary) hypertension: Secondary | ICD-10-CM | POA: Diagnosis present

## 2016-04-16 DIAGNOSIS — G8929 Other chronic pain: Secondary | ICD-10-CM | POA: Diagnosis present

## 2016-04-16 DIAGNOSIS — M8580 Other specified disorders of bone density and structure, unspecified site: Secondary | ICD-10-CM | POA: Diagnosis present

## 2016-04-16 DIAGNOSIS — E559 Vitamin D deficiency, unspecified: Secondary | ICD-10-CM | POA: Diagnosis present

## 2016-04-16 DIAGNOSIS — R296 Repeated falls: Secondary | ICD-10-CM

## 2016-04-16 DIAGNOSIS — G629 Polyneuropathy, unspecified: Secondary | ICD-10-CM

## 2016-04-16 DIAGNOSIS — S52022A Displaced fracture of olecranon process without intraarticular extension of left ulna, initial encounter for closed fracture: Secondary | ICD-10-CM | POA: Diagnosis not present

## 2016-04-16 HISTORY — DX: Repeated falls: R29.6

## 2016-04-16 LAB — BASIC METABOLIC PANEL
Anion gap: 6 (ref 5–15)
BUN: 23 mg/dL — AB (ref 6–20)
CALCIUM: 8.5 mg/dL — AB (ref 8.9–10.3)
CO2: 27 mmol/L (ref 22–32)
Chloride: 103 mmol/L (ref 101–111)
Creatinine, Ser: 1.13 mg/dL — ABNORMAL HIGH (ref 0.44–1.00)
GFR, EST AFRICAN AMERICAN: 53 mL/min — AB (ref >60)
GFR, EST NON AFRICAN AMERICAN: 46 mL/min — AB (ref >60)
Glucose, Bld: 134 mg/dL — ABNORMAL HIGH (ref 65–99)
Potassium: 4.6 mmol/L (ref 3.5–5.1)
SODIUM: 136 mmol/L (ref 135–145)

## 2016-04-16 LAB — HEPATITIS PANEL, ACUTE
HCV Ab: <0.1 s/co ratio (ref 0.0–0.9)
HEP B S AG: NEGATIVE
Hep A IgM: NEGATIVE
Hep B C IgM: NEGATIVE

## 2016-04-16 MED ORDER — AMLODIPINE BESYLATE 5 MG PO TABS
5.0000 mg | ORAL_TABLET | Freq: Every day | ORAL | Status: DC
  Administered 2016-04-16 – 2016-04-17 (×2): 5 mg via ORAL
  Filled 2016-04-16: qty 1

## 2016-04-16 NOTE — Evaluation (Signed)
Occupational Therapy Evaluation Patient Details Name: Shirley [name] MRN: [id number] DOB: [birth year] Today's Date: 04/16/2016    History of Present Illness Shirley [name] is an 76 y.o. white female.with complex medical hx, [facility]2016 with acute renal failure from NSAID use and L olecranon fx. Pt underwent ORIF of L olecranon on 03/22/2016 after renal function improved. Pt dc'd home shortly thereafter. She has sustained numerous falls at home. On her first post op follow up at [place] office her olecranon fragment was noted to be displaced once again.   Clinical Impression   Pt with decline in function and safety with ADLs and ADL mobility. Pt is NWB of L UE and has hx of falls at home. Pt with decreased strength, balance, endurance and L UE function. Pt would benefit from acute OT services to address impairments to increase level of function and safety    Follow Up Recommendations  SNF;Supervision/Assistance - 24 hour    Equipment Recommendations  None recommended by OT;Other (comment) (TBD at next level of care)    Recommendations for Other Services       Precautions / Restrictions Precautions Precautions: Fall Precaution Comments: L UE soft cast, forearm - above elbow Required Braces or Orthoses: Other Brace/Splint Restrictions Weight Bearing Restrictions: Yes LUE Weight Bearing: Non weight bearing      Mobility Bed Mobility               General bed mobility comments: pt up in recliner  Transfers Overall transfer level: Needs assistance Equipment used: 1 person hand held assist (pt pushed IV pole) Transfers: Sit to/from [facility] Sit to Stand: Mod assist Stand pivot transfers: Min assist       General transfer comment: may have PT try PFRW with pt    Balance Overall balance assessment: Needs assistance;History of Falls Sitting-balance support: Single extremity supported;Feet supported Sitting balance-Leahy Scale: Fair      Standing balance support: Single extremity supported;During functional activity Standing balance-Leahy Scale: Poor                              ADL Overall ADL's : Needs assistance/impaired Eating/Feeding: Set up;Sitting   Grooming: Minimal assistance;Standing   Upper Body Bathing: Minimal assistance   Lower Body Bathing: Maximal assistance;Sit to/from stand   Upper Body Dressing : Minimal assistance   Lower Body Dressing: Maximal assistance;Sit to/from stand   Toilet Transfer: Moderate assistance;Comfort height toilet;Ambulation   Toileting- Clothing Manipulation and Hygiene: Moderate assistance;Sit to/from stand;Sitting/lateral lean       Functional mobility during ADLs: Moderate assistance;Cueing for safety       Vision   Vision Assessment?: No apparent visual deficits                Pertinent Vitals/Pain Pain Assessment: 0-10 Pain Score: 5  Pain Location: L elbow Pain Descriptors / Indicators: Sore;Grimacing;Guarding Pain Intervention(s): Monitored during session;Premedicated before session;Repositioned     Hand Dominance Right   Extremity/Trunk Assessment Upper Extremity Assessment Upper Extremity Assessment: Generalized weakness;LUE deficits/detail LUE Deficits / Details: immobilized from wrist to above elbow, able to move digits and shoulder  LUE: Unable to fully assess due to immobilization   Lower Extremity Assessment Lower Extremity Assessment: Defer to PT evaluation       Communication Communication Communication: No difficulties   Cognition Arousal/Alertness: Awake/alert Behavior During Therapy: WFL for tasks assessed/performed Overall Cognitive Status: Within Functional Limits for tasks assessed  General Comments   pt very pleasant and cooperative    Exercises   Other Exercises Other Exercises: Pt educated on ROM of L digits, L shoulder Other Exercises: Pt educated on positioning of L UE in  elevation for edema [place] expects to be discharged to:: Private residence Living Arrangements: Spouse/significant other Available Help at Discharge: Family;Available PRN/intermittently Type of Home: House Home Access: Stairs to enter [employer] of Steps: 3 Entrance Stairs-Rails: Right;Left;Can reach both Home Layout: One level     Bathroom Shower/Tub: Occupational psychologist: Handicapped height     Home Equipment: Environmental consultant - 2 wheels;Cane - single point;Bedside commode;Shower seat - built in;Hand held shower head   Additional Comments: pt reporst that her husband can help some, but has hx of CVA but that she does ot have to assist him with any ADLs      Prior Functioning/Environment Level of Independence: Independent with assistive device(s)        Comments: walked with a cane, had housekeeping assist        OT Problem List: Impaired UE functional use;Decreased knowledge of use of DME or AE;Decreased coordination;Decreased activity tolerance;Decreased knowledge of precautions;Impaired balance (sitting and/or standing);Pain;Decreased safety awareness;Increased edema      OT Treatment/Interventions: Self-care/ADL training;DME and/or AE instruction;Therapeutic activities;Patient/family education    OT Goals(Current goals can be found in the care plan section) Acute Rehab OT Goals Patient Stated Goal: get better and go home OT Goal Formulation: With patient Time For Goal Achievement: 04/23/16 Potential to Achieve Goals: Good ADL Goals Pt Will Perform Grooming: with min guard assist;standing Pt Will Perform Upper Body Bathing: with min guard assist;with supervision;with set-up;sitting Pt Will Perform Lower Body Bathing: with mod assist;sitting/lateral leans;sit to/from stand Pt Will Perform Upper Body Dressing: with min guard assist;sitting;with supervision;with set-up Pt Will Transfer to Toilet: with min  assist;ambulating;grab bars Pt Will Perform Toileting - Clothing Manipulation and hygiene: with min assist;sit to/from stand;sitting/lateral leans  OT Frequency: Min 2X/week   Barriers to D/C:    uncertain if pt's spouse will be able to provide current level of care, may need ST SNF before return home                     End of Session Equipment Utilized During Treatment: Gait belt  Activity Tolerance: Patient limited by pain Patient left: in chair;with call bell/phone within reach;with family/visitor present  OT Visit Diagnosis: Unsteadiness on feet (R26.81);Other abnormalities of gait and mobility (R26.89);Repeated falls (R29.6);History of falling (Z91.81);Muscle weakness (generalized) (M62.81);Pain                ADL either performed or assessed with clinical judgement  Time: 1015-1047 OT Time Calculation (min): 32 min Charges:  OT General Charges $OT Visit: 1 Procedure OT Treatments $Self Care/Home Management : 8-22 mins $Therapeutic Activity: 8-22 mins G-Codes: OT G-codes **NOT FOR INPATIENT CLASS** Functional Assessment Tool Used: AM-PAC 6 Clicks Daily Activity;Clinical judgement Functional Limitation: Self care Self Care Current Status (Q6761): At least 40 percent but less than 60 percent impaired, limited or restricted Self Care Goal Status (P5093): At least 20 percent but less than 40 percent impaired, limited or restricted     Britt Bottom 04/16/2016, 1:20 PM

## 2016-04-16 NOTE — Consult Note (Signed)
Physical Medicine and Rehabilitation Consult  Reason for Consult: Multiple falls with recurrent left olecranon fracture. Referring Physician: Dr. Marcelino Scot   HPI: Shirley [name] is a 76 y.o. female with history of PAT, cervical spondylosis, fibromyalgia, neuropathy, chronic pain who was recently admitted 03/17/16 for confusion, acute renal failure due to NSAIDs and fall with left olecranon fracture requiring ORIF. She was discharged to home 1/31 but continued to have multiple falls at home with displacement of olecranon fragment. She was admitted on 04/15/16 for ORIF with revision of left olecranon and left hip injection by Dr. Marcelino Scot. Post op to be NWB on left elbow and PT/OT evaluations pending.    Insurance had denied CIR after last surgery and family had elected to go home with hired caregivers.  She was doing better and had decreased amount of supervision to part of the day. She fell attempting to get up without assist. Husband has had multiple strokes and unable to assist.    Review of Systems  HENT: Negative for hearing loss and tinnitus.   Eyes: Negative for blurred vision and double vision.  Respiratory: Positive for cough and shortness of breath. Negative for wheezing.   Cardiovascular: Negative for chest pain and palpitations.  Gastrointestinal: Negative for constipation, heartburn and nausea.  Genitourinary: Positive for frequency and urgency.  Musculoskeletal: Positive for back pain, falls (tends to shuffle when walking), joint pain, myalgias and neck pain.  Skin: Negative for rash.  Neurological: Positive for sensory change (decreased sensation BLE > BUE). Negative for dizziness and headaches.  Psychiatric/Behavioral: The patient is not nervous/anxious and does not have insomnia.       Past Medical History:  Diagnosis Date  . Allergy    vasomotor rhinitis  . Anemia   . Asthma    h/o asthma and bronchitis  . BV (bacterial vaginosis)    history of frequent  . Cervical  spondylosis    herniated disk protruding @ C6-7, impinging cord and left axillary root sleeve.  . Chronic pain   . Closed olecranon fracture, left, initial encounter   . Depression   . DVT of upper extremity (deep vein thrombosis) ([place])    h/o left(after wrist fracture/surgery); no residual thrombus or phlebitis by Dopplers July 2014  . Dyspnea    with exertion  . Edema   . Fibromyalgia    sees Dr.Phillips-pain management  . GERD (gastroesophageal reflux disease)    h/o  . Headache    marigraine- hormal - none in years  . Hypertension    never has been treated that daughter is aware  . Neuropathy ([place])   . Neuropathy ([place])   . Osteopenia    mild at hip (T-1.3)  . PAT (paroxysmal atrial tachycardia) ([place])   . Pneumonia    years ago  . Varicose veins   . 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 Eilleen Kempf @ [facility]  . Vision problem    wears glasses  . Vitamin D deficiency     Past Surgical History:  Procedure Laterality Date  . ABDOMINAL HYSTERECTOMY  1999   BSO for endometriosis  . APPENDECTOMY    . BACK SURGERY  age 52   ruptured disk L3-4   . CATARACT EXTRACTION  2000   B/L  .  COLONOSCOPY    . DOPPLER ECHOCARDIOGRAPHY  July 2012   Normal EF 60-65% , mild concentric LVH. Grade 2 diastolic dysfunction with elevated EDP. Massive left atrial dilation. Pulmonary pressures 30-40 mm Hg; aortic sclerosis but no stenosis. Moderate TR  . FINGER SURGERY     left finger for tender rupture from fall.  Marland Kitchen FOOT SURGERY     multiple(at least 4 on right, 5 on left)-left Dr.Kerner([place])11/08,left Dr.Nunley-excision 2nd and 3rd mt heads w/artho and hammertoe surgery 4th and 5th 04/2006. left great toe IP fusion and revision of fusion of 2nd through 5th toes 12/2005. Right foot  surgeries  Dr.Bednarz 04/2008 and 04/2009.  Marland Kitchen KNEE SURGERY Right    right knee replacement  . NECK SURGERY  2007   cervical  . ORIF ELBOW FRACTURE Left 03/22/2016   Procedure: OPEN REDUCTION INTERNAL FIXATION (ORIF) ELBOW/OLECRANON FRACTURE;  Surgeon: Altamese Berea, MD;  Location: [facility];  Service: Orthopedics;  Laterality: Left;  . RADIOLOGY WITH ANESTHESIA Left 01/06/2016   Procedure: MRI LEFT HIP WITH AND WITHOUT;  Surgeon: Medication Radiologist, MD;  Location: [facility];  Service: Radiology;  Laterality: Left;  . REFRACTIVE SURGERY  2007   left eye  . SHOULDER SURGERY     x8 (4 on rt side and 4 on left side)  . SPINAL FUSION  6/09   Dr.Cohen  . TONSILLECTOMY AND ADENOIDECTOMY    . WRIST SURGERY Left    2 surgeries left wrist after fracture(complicated by DVT)    Family History  Problem Relation Age of Onset  . Stroke Mother   . Osteoarthritis Mother     Died at age 49  . Heart disease Father   . Lung cancer Father   . Heart failure Father     Died at age 37  . Osteoporosis Sister   . Diabetes Brother   . Heart attack Brother 46  . Diabetes Brother   . Diabetes Brother   . Heart attack Brother 84  . Breast cancer Paternal Aunt   . Heart failure Maternal Grandmother     Died at age 61, unsure of her heart disease  . Heart failure Paternal Grandfather     Died at age 32, unsure of cardiac disease.    Social History:  Married. She was independent prior to fall in Jan. She was using a cane to ambulate at home. She reports that she has been smokes occasional cigarette .  She has never used smokeless tobacco. She reports that she does not drink alcohol or use drugs.     Allergies  Allergen Reactions  . No Known Allergies     Medications Prior to Admission  Medication Sig Dispense Refill  . acetaminophen (TYLENOL) 325 MG tablet Take 2 tablets (650 mg total) by mouth every 4 (four) hours as needed for mild pain (or temp > 37.5 C (99.5 F)).    Marland Kitchen ALPRAZolam (XANAX) 1 MG tablet  Take 0.5-1 mg by mouth 3 (three) times daily as needed for anxiety or sleep.     Marland Kitchen amLODipine (NORVASC) 5 MG tablet Take 1 tablet (5 mg total) by mouth daily. 30 tablet 0  . cetirizine (ZYRTEC) 10 MG tablet Take 10 mg by mouth daily as needed for allergies.     . Cholecalciferol (VITAMIN D3) 5000 UNITS CAPS Take 5,000 Units by mouth 2 (two) times daily.     . fluticasone (FLONASE) 50 MCG/ACT nasal spray Place 2 sprays into both nostrils daily as needed for allergies or  rhinitis.    Marland Kitchen gabapentin (NEURONTIN) 300 MG capsule Take 300-900 mg by mouth 3 (three) times daily. Take 300mg  in the am and afternoon, and then take 900mg  at night    . metoprolol tartrate (LOPRESSOR) 25 MG tablet Take 1 tablet (25 mg total) by mouth 2 (two) times daily. 60 tablet 0  . oxycodone (ROXICODONE) 30 MG immediate release tablet Take 30 mg by mouth every 4 (four) hours as needed for pain (for pain). Reported on 06/03/2015    . sertraline (ZOLOFT) 100 MG tablet Take 100 mg by mouth at bedtime.    . torsemide (DEMADEX) 10 MG tablet Take 20 mg by mouth daily.     . [DISCONTINUED] aspirin EC 81 MG EC tablet Take 1 tablet (81 mg total) by mouth daily. (Patient not taking: Reported on 04/09/2016) 30 tablet 0  . [DISCONTINUED] methocarbamol (ROBAXIN) 500 MG tablet Take 1 tablet (500 mg total) by mouth every 8 (eight) hours as needed for muscle spasms. (Patient not taking: Reported on 04/09/2016) 30 tablet 0    Home:    Functional History:   Functional Status:  Mobility:          ADL:    Cognition: Cognition Orientation Level: Oriented X4    Blood pressure 140/72, pulse (!) 54, temperature 98.2 F (36.8 C), temperature source Oral, resp. rate 18, SpO2 94 %. Physical Exam  Nursing note and vitals reviewed. Constitutional: She is oriented to person, place, and time. She appears well-developed and well-nourished.  Up in [place] getting ready for bath.   HENT:  Head: Normocephalic and atraumatic.  Mouth/Throat:  Oropharynx is clear and moist. No oropharyngeal exudate.  Eyes: Conjunctivae are normal. Pupils are equal, round, and reactive to light. No scleral icterus.  Neck: Normal range of motion. Neck supple.  Cardiovascular: Normal rate and regular rhythm.   Respiratory: Effort normal and breath sounds normal. No stridor. No respiratory distress. She has no wheezes.  GI: Soft. Bowel sounds are normal. She exhibits no distension. There is no tenderness.  Musculoskeletal: She exhibits edema.  LUE with compressive dressing.   Neurological: She is alert and oriented to person, place, and time.  Speech clear. Follows commands without difficulty. BLE with decrease in motor control and occasional spasticity with ROM.   Skin: Skin is warm and dry.  Psychiatric: She has a normal mood and affect. Her behavior is normal. Judgment and thought content normal.    Results for orders placed or performed during the hospital encounter of 04/15/16 (from the past 24 hour(s))  Rapid HIV screen (HIV 1/2 Ab+Ag)     Status: None   Collection Time: 04/15/16 12:36 PM  Result Value Ref Range   HIV-1 P24 Antigen - HIV24 NON REACTIVE NON REACTIVE   HIV 1/2 Antibodies NON REACTIVE NON REACTIVE   Interpretation (HIV Ag Ab)      A non reactive test result means that HIV 1 or HIV 2 antibodies and HIV 1 p24 antigen were not detected in the specimen.  Hepatitis panel, acute     Status: None   Collection Time: 04/15/16 12:36 PM  Result Value Ref Range   Hepatitis B Surface Ag Negative Negative   HCV Ab <0.1 0.0 - 0.9 s/co ratio   Hep A IgM Negative Negative   Hep B C IgM Negative Negative  CBC     Status: Abnormal   Collection Time: 04/15/16 12:36 PM  Result Value Ref Range   WBC 8.8 4.0 - 10.5 K/uL  RBC 3.45 (L) 3.87 - 5.11 MIL/uL   Hemoglobin 9.6 (L) 12.0 - 15.0 g/dL   HCT 31.9 (L) 36.0 - 46.0 %   MCV 92.5 78.0 - 100.0 fL   MCH 27.8 26.0 - 34.0 pg   MCHC 30.1 30.0 - 36.0 g/dL   RDW 15.3 11.5 - 15.5 %   Platelets 668  (H) 150 - 400 K/uL  Creatinine, serum     Status: Abnormal   Collection Time: 04/15/16 12:36 PM  Result Value Ref Range   Creatinine, Ser 1.34 (H) 0.44 - 1.00 mg/dL   GFR calc non Af Amer 37 (L) >60 mL/min   GFR calc Af Amer 43 (L) >60 mL/min  Basic metabolic panel     Status: Abnormal   Collection Time: 04/16/16  3:03 AM  Result Value Ref Range   Sodium 136 135 - 145 mmol/L   Potassium 4.6 3.5 - 5.1 mmol/L   Chloride 103 101 - 111 mmol/L   CO2 27 22 - 32 mmol/L   Glucose, Bld 134 (H) 65 - 99 mg/dL   BUN 23 (H) 6 - 20 mg/dL   Creatinine, Ser 1.13 (H) 0.44 - 1.00 mg/dL   Calcium 8.5 (L) 8.9 - 10.3 mg/dL   GFR calc non Af Amer 46 (L) >60 mL/min   GFR calc Af Amer 53 (L) >60 mL/min   Anion gap 6 5 - 15   Dg Elbow 2 Views Left  Result Date: 04/15/2016 CLINICAL DATA:  Olecranon fracture. EXAM: LEFT ELBOW - 2 VIEW COMPARISON:  Fluoroscopic images 2 04/13/2016 FINDINGS: Plate and screw fixation of olecranon fracture. The alignment is anatomic. Bony details are obscured by cast material. IMPRESSION: Plate and screw fixation of olecranon fracture with good anatomic alignment. Electronically Signed   By: [name] M.D.   On: 04/15/2016 10:44   Dg Elbow 2 Views Left  Result Date: 04/15/2016 CLINICAL DATA:  ORIF. EXAM: LEFT ELBOW - 2 VIEW; DG C-ARM 61-120 MIN COMPARISON:  03/22/2016. FINDINGS: ORIF left ulna. Hardware intact. Anatomic alignment. Similar findings noted on prior exam . IMPRESSION: ORIF left ulna. Electronically Signed   By: Marcello Moores  Register   On: 04/15/2016 10:16   Dg C-arm 1-60 Min  Result Date: 04/15/2016 CLINICAL DATA:  ORIF. EXAM: LEFT ELBOW - 2 VIEW; DG C-ARM 61-120 MIN COMPARISON:  03/22/2016. FINDINGS: ORIF left ulna. Hardware intact. Anatomic alignment. Similar findings noted on prior exam . IMPRESSION: ORIF left ulna. Electronically Signed   By: Marcello Moores  Register   On: 04/15/2016 10:16   Dg Hip Operative Unilat W Or W/o Pelvis Left  Result Date:  04/15/2016 CLINICAL DATA:  Left hip steroid injection for osteoarthritis. EXAM: OPERATIVE LEFT HIP (WITH PELVIS IF PERFORMED) TECHNIQUE: Fluoroscopic spot image(s) were submitted for interpretation post-operatively. COMPARISON:  Pelvis radiograph 03/24/2015 FINDINGS: Single fluoroscopic image from left hip steroid injection is provided for interpretation. The needle is seen overlying the lower outer quadrant of the left femoral head. Small amount of contrast is seen within the left hip joint space. Fluoroscopy time is recorded as 6 seconds. IMPRESSION: Successful puncture of the left hip joint space. Electronically Signed   By: [name] M.D.   On: 04/15/2016 10:50    Assessment/Plan: Diagnosis: left olecranon fracture/revision  Comment: Pt with chronic gait issues. Not a great deal different from a functional standpoint compared to before the recent fall and displacement of fracture. Recommend home with supervision and assist.   Meredith Staggers, MD, [facility] 04/16/2016    Bary Leriche, PA-C 04/16/2016

## 2016-04-16 NOTE — Progress Notes (Signed)
Pt previously admitted 02/2016 for falls and left olecranon fracture. [employer] denied approval for admission to inpt rehab at that time. Dr. Naaman Plummer evaluated case again, recommends home with [facility] and supervision. If that level of care is not available in the home, would consider SNF. I contacted RN Cm and discussed. [phone]

## 2016-04-16 NOTE — Progress Notes (Signed)
Orthopedic Trauma Service Progress Note    Subjective:  Patient reports pain as mild.    Doing ok Sitting in bedside chair Got up with nurse Pain no worse than preop  No CP No SOB   ROS As above  Objective:   VITALS:   Vitals:   04/15/16 2037 04/15/16 2115 04/16/16 0035 04/16/16 0500  BP: (!) 149/65 (!) 155/68 (!) 129/48 140/72  Pulse: 65 66 61 (!) 54  Resp: 18 18    Temp: 98.1 F (36.7 C) 98.1 F (36.7 C) 98.6 F (37 C) 98.2 F (36.8 C)  TempSrc: Oral Oral Oral Oral  SpO2: 94% 94% 94% 94%    Intake/Output      02/22 0701 - 02/23 0700 02/23 0701 - 02/24 0700   P.O. 240    I.V. 1200    Total Intake 1440     Urine 0    Blood 30    Total Output 30     Net +1410          Urine Occurrence 3 x      LABS  Results for orders placed or performed during the hospital encounter of 04/15/16 (from the past 24 hour(s))  Rapid HIV screen (HIV 1/2 Ab+Ag)     Status: None   Collection Time: 04/15/16 12:36 PM  Result Value Ref Range   HIV-1 P24 Antigen - HIV24 NON REACTIVE NON REACTIVE   HIV 1/2 Antibodies NON REACTIVE NON REACTIVE   Interpretation (HIV Ag Ab)      A non reactive test result means that HIV 1 or HIV 2 antibodies and HIV 1 p24 antigen were not detected in the specimen.  Hepatitis panel, acute     Status: None   Collection Time: 04/15/16 12:36 PM  Result Value Ref Range   Hepatitis B Surface Ag Negative Negative   HCV Ab <0.1 0.0 - 0.9 s/co ratio   Hep A IgM Negative Negative   Hep B C IgM Negative Negative  CBC     Status: Abnormal   Collection Time: 04/15/16 12:36 PM  Result Value Ref Range   WBC 8.8 4.0 - 10.5 K/uL   RBC 3.45 (L) 3.87 - 5.11 MIL/uL   Hemoglobin 9.6 (L) 12.0 - 15.0 g/dL   HCT 31.9 (L) 36.0 - 46.0 %   MCV 92.5 78.0 - 100.0 fL   MCH 27.8 26.0 - 34.0 pg   MCHC 30.1 30.0 - 36.0 g/dL   RDW 15.3 11.5 - 15.5 %   Platelets 668 (H) 150 - 400 K/uL  Creatinine, serum     Status: Abnormal   Collection Time:  04/15/16 12:36 PM  Result Value Ref Range   Creatinine, Ser 1.34 (H) 0.44 - 1.00 mg/dL   GFR calc non Af Amer 37 (L) >60 mL/min   GFR calc Af Amer 43 (L) >60 mL/min  Basic metabolic panel     Status: Abnormal   Collection Time: 04/16/16  3:03 AM  Result Value Ref Range   Sodium 136 135 - 145 mmol/L   Potassium 4.6 3.5 - 5.1 mmol/L   Chloride 103 101 - 111 mmol/L   CO2 27 22 - 32 mmol/L   Glucose, Bld 134 (H) 65 - 99 mg/dL   BUN 23 (H) 6 - 20 mg/dL   Creatinine, Ser 1.13 (H) 0.44 - 1.00 mg/dL   Calcium 8.5 (L) 8.9 - 10.3 mg/dL   GFR calc non Af Amer 46 (L) >60 mL/min   GFR calc Af  Amer 53 (L) >60 mL/min   Anion gap 6 5 - 15     PHYSICAL EXAM:   Gen: sitting in bedside chair, NAD, eating breakfast Lungs: breathing unlabored Cardiac: Regular  Ext:       Left Upper Extremity   Splint fitting well  Minimal swelling  Ext warm   Radial, ulnar, median nv motor and sensory functions intact    Assessment/Plan: 1 Day Post-Op   Principal Problem:   Olecranon fracture Active Problems:   Left hip pain   Closed fracture of left olecranon process   Fibromyalgia   Chronic pain   Hypertension   Neuropathy (HCC)   Osteopenia   Vitamin D deficiency   Recurrent falls   Anti-infectives    Start     Dose/Rate Route Frequency Ordered Stop   04/15/16 1400  ceFAZolin (ANCEF) IVPB 1 g/50 mL premix     1 g 100 mL/hr over 30 Minutes Intravenous Every 6 hours 04/15/16 1117 04/16/16 0304   04/15/16 0645  ceFAZolin (ANCEF) IVPB 2g/100 mL premix     2 g 200 mL/hr over 30 Minutes Intravenous On call to O.R. 04/15/16 [id number] 04/15/16 0820    .  POD/HD#: 40   76 y/o female with complex medical history with neuropathy and hx of recurrent falls with loss of reduction of L olecranon fracture s/p fall   - revision L olecranon fracture  NWB L elbow  Ok to try walker  Pt will remain in splint and sling for 2 weeks then we will likely convert to hinged elbow brace  PT/OT evals   Very  concerned about pts ability to maintain compliance, particularly getting up with assistance   Pt has demonstrated failure to care for self at home, she has had recurrent falls   Her neuropathy put her at even further risk for continue falls   If pt disrupts her repair again it could be catastrophic and lead to permanent disability related to her left arm   Clinical judgement would dictate that pt needs a higher level of care/supervision such as inpatient rehab for a period of time to help increase overall conditioning and ability to mobilize   - Pain management:  Continue with home regimen   - ABL anemia/Hemodynamics  Stable  BPs ok  - Medical issues   Home meds  - DVT/PE prophylaxis:  SCDs  No pharmacologics at this time  - ID:   periop abx   - Metabolic Bone Disease:  Pt on vitamin d supplementation  Chronic opioid use further exacerbates metabolic bone disease   - Activity:  UP WITH ASSISTANCE ONLY  - FEN/GI prophylaxis/Foley/Lines:  Reg diet    - Impediments to fracture healing:  Falls  Chronic opioid use  Vitamin d deficiency   - Dispo:  PT/OT evals  CIR eval  Will get SW consult for SNF      Jari Pigg, PA-C [facility] [phone] (P) [phone] (O) 04/16/2016, 9:25 AM

## 2016-04-16 NOTE — Evaluation (Signed)
Physical Therapy Evaluation Patient Details Name: Shirley [name] MRN: [id number] DOB: 1940-09-08 Today's Date: 04/16/2016   History of Present Illness  Shirley [name] is an 76 y.o. Jaquavious Mercer female.with complex medical hx, [facility]2016 with acute renal failure from NSAID use and L olecranon fx. Pt underwent ORIF of L olecranon on 03/22/2016 after renal function improved. Pt dc'd home shortly thereafter. She has sustained numerous falls at home. On her first post op follow up at MD office her olecranon fragment was noted to be displaced once again.  Clinical Impression  Pt admitted with above diagnosis. Pt currently with functional limitations due to the deficits listed below (see PT Problem List). Pt was able to ambulate with hemiwalker but still min assist for stability and with challenges needed more assist. Will follow acutely.  Recommend SNF as it will be difficult for pt to manage at home with husband with prior CVA.  Pt agreeable to sNF.  Pt will benefit from skilled PT to increase their independence and safety with mobility to allow discharge to the venue listed below.    Follow Up Recommendations SNF;Supervision/Assistance - 24 hour    Equipment Recommendations  Other (comment) (hemiwalker)    Recommendations for Other Services       Precautions / Restrictions Precautions Precautions: Fall Precaution Comments: L UE soft cast, forearm - above elbow Required Braces or Orthoses: Other Brace/Splint Restrictions Weight Bearing Restrictions: Yes LUE Weight Bearing: Non weight bearing      Mobility  Bed Mobility               General bed mobility comments: pt up in recliner  Transfers Overall transfer level: Needs assistance Equipment used: Hemi-walker Transfers: Sit to/from Stand Sit to Stand: Min assist Stand pivot transfers: Min assist       General transfer comment: Pt needed cues for hand placement but doing well maintaining NWB left UE. Can stand  statically with min guard assist with hemiwalker  Ambulation/Gait Ambulation/Gait assistance: Min assist Ambulation Distance (Feet): 50 Feet Assistive device: Hemi-walker Gait Pattern/deviations: Step-to pattern;Decreased stride length;Antalgic;Staggering right;Wide base of support   Gait velocity interpretation: Below normal speed for age/gender General Gait Details: Pt needed cues to sequence steps and hemiwalker.  Pt was able to ambulate fairly well with hemiwalker if sequenced correctly.  At times, pt would not have hemiwalker in correct position and lose balance to her right needing steadying assist.  Needs more practice and is agreeable to go to SNF for therapy.    Stairs            Wheelchair Mobility    Modified Rankin (Stroke Patients Only)       Balance Overall balance assessment: Needs assistance;History of Falls Sitting-balance support: Single extremity supported;Feet supported Sitting balance-Leahy Scale: Fair     Standing balance support: Single extremity supported;During functional activity Standing balance-Leahy Scale: Poor Standing balance comment: relies on hemiwalker and when not in correct position needing min assist for balance.              High level balance activites: Turns;Sudden stops;Direction changes High Level Balance Comments: Pt needed min assist with hemiwalker when in challenging position             Pertinent Vitals/Pain Pain Assessment: 0-10 Pain Score: 5  Pain Location: L elbow Pain Descriptors / Indicators: Sore;Grimacing;Guarding Pain Intervention(s): Limited activity within patient's tolerance;Monitored during session;Repositioned  VSS  Home Living Family/patient expects to be discharged to:: Private residence Living  Arrangements: Spouse/significant other Available Help at Discharge: Family;Available PRN/intermittently Type of Home: House Home Access: Stairs to enter Entrance Stairs-Rails: Right;Left;Can reach  both Entrance Stairs-Number of Steps: 3 Home Layout: One level Home Equipment: Walker - 2 wheels;Cane - single point;Bedside commode;Shower seat - built in;Hand held Veterinary surgeon - quad Additional Comments: pt reports that her husband can help some, but has hx of CVA but that she does ot have to assist him with any ADLs    Prior Function Level of Independence: Independent with assistive device(s)         Comments: walked with a cane, had housekeeping assist     Hand Dominance   Dominant Hand: Right    Extremity/Trunk Assessment   Upper Extremity Assessment Upper Extremity Assessment: Defer to OT evaluation LUE Deficits / Details: immobilized from wrist to above elbow, able to move digits and shoulder  LUE: Unable to fully assess due to immobilization    Lower Extremity Assessment Lower Extremity Assessment: RLE deficits/detail;LLE deficits/detail RLE Deficits / Details: hip 3-/5, knee 3/5 LLE Deficits / Details: hip 3-/5, knee 3/5    Cervical / Trunk Assessment Cervical / Trunk Assessment: Kyphotic  Communication   Communication: No difficulties  Cognition Arousal/Alertness: Awake/alert Behavior During Therapy: WFL for tasks assessed/performed Overall Cognitive Status: Within Functional Limits for tasks assessed                      General Comments      Exercises     Assessment/Plan    PT Assessment Patient needs continued PT services  PT Problem List Decreased strength;Decreased activity tolerance;Decreased balance;Decreased mobility;Decreased coordination;Decreased knowledge of use of DME;Decreased safety awareness;Decreased knowledge of precautions       PT Treatment Interventions DME instruction;Gait training;Stair training;Functional mobility training;Therapeutic activities;Therapeutic exercise;Balance training;Patient/family education    PT Goals (Current goals can be found in the Care Plan section)  Acute Rehab PT Goals Patient Stated  Goal: get better and go home PT Goal Formulation: With patient Time For Goal Achievement: 04/30/16 Potential to Achieve Goals: Good    Frequency Min 6X/week   Barriers to discharge Decreased caregiver support      Co-evaluation               End of Session Equipment Utilized During Treatment: Gait belt Activity Tolerance: Patient limited by fatigue Patient left: in chair;with call bell/phone within reach Nurse Communication: Mobility status PT Visit Diagnosis: Muscle weakness (generalized) (M62.81);Unsteadiness on feet (R26.81);History of falling (Z91.81)    Functional Assessment Tool Used: AM-PAC 6 Clicks Basic Mobility Functional Limitation: Mobility: Walking and moving around Mobility: Walking and Moving Around Current Status (A7014): At least 40 percent but less than 60 percent impaired, limited or restricted Mobility: Walking and Moving Around Goal Status [phone]): At least 1 percent but less than 20 percent impaired, limited or restricted    Time: 1126-1150 PT Time Calculation (min) (ACUTE ONLY): 24 min   Charges:   PT Evaluation $PT Eval Moderate Complexity: 1 Procedure PT Treatments $Gait Training: 8-22 mins   PT G Codes:   PT G-Codes **NOT FOR INPATIENT CLASS** Functional Assessment Tool Used: AM-PAC 6 Clicks Basic Mobility Functional Limitation: Mobility: Walking and moving around Mobility: Walking and Moving Around Current Status (T1438): At least 40 percent but less than 60 percent impaired, limited or restricted Mobility: Walking and Moving Around Goal Status [phone]): At least 1 percent but less than 20 percent impaired, limited or restricted     Cesare Sumlin F Caydan Mctavish  04/16/2016, 1:41 PM [facility] Acute Rehabilitation [phone] (pager)

## 2016-04-17 DIAGNOSIS — S52022D Displaced fracture of olecranon process without intraarticular extension of left ulna, subsequent encounter for closed fracture with routine healing: Secondary | ICD-10-CM | POA: Diagnosis not present

## 2016-04-17 DIAGNOSIS — R531 Weakness: Secondary | ICD-10-CM | POA: Diagnosis not present

## 2016-04-17 DIAGNOSIS — M25529 Pain in unspecified elbow: Secondary | ICD-10-CM | POA: Diagnosis not present

## 2016-04-17 DIAGNOSIS — S52022A Displaced fracture of olecranon process without intraarticular extension of left ulna, initial encounter for closed fracture: Secondary | ICD-10-CM | POA: Diagnosis not present

## 2016-04-17 DIAGNOSIS — Z4789 Encounter for other orthopedic aftercare: Secondary | ICD-10-CM | POA: Diagnosis not present

## 2016-04-17 LAB — BASIC METABOLIC PANEL
Anion gap: 7 (ref 5–15)
BUN: 22 mg/dL — AB (ref 6–20)
CALCIUM: 8.4 mg/dL — AB (ref 8.9–10.3)
CO2: 27 mmol/L (ref 22–32)
CREATININE: 1.06 mg/dL — AB (ref 0.44–1.00)
Chloride: 105 mmol/L (ref 101–111)
GFR calc Af Amer: 57 mL/min — ABNORMAL LOW (ref >60)
GFR calc non Af Amer: 49 mL/min — ABNORMAL LOW (ref >60)
GLUCOSE: 158 mg/dL — AB (ref 65–99)
Potassium: 4 mmol/L (ref 3.5–5.1)
Sodium: 139 mmol/L (ref 135–145)

## 2016-04-17 MED ORDER — OXYCODONE HCL 30 MG PO TABS: 30.0000 mg | ORAL_TABLET | ORAL | 0 refills | Status: DC | PRN

## 2016-04-17 MED ORDER — ENOXAPARIN SODIUM 40 MG/0.4ML ~~LOC~~ SOLN: 40.0000 mg | SUBCUTANEOUS | 0 refills | Status: DC

## 2016-04-17 NOTE — Progress Notes (Signed)
Patient will DC to: [facility] Anticipated DC date: 04/17/16 Family notified: Daughter Transport by: Corey Harold    Per MD patient ready for DC to [facility]. RN, patient, patient's family, and facility notified of DC. Discharge Summary sent to facility. RN given number for report. DC packet on chart. Ambulance transport requested for patient.   CSW signing off.  Cedric Fishman, [place] Social Worker [phone]

## 2016-04-17 NOTE — NC FL2 (Signed)
[place] LEVEL OF CARE SCREENING TOOL     IDENTIFICATION  Patient Name: Shirley [name] Birthdate: 1940-06-07 Sex: female Admission Date (Current Location): 04/15/2016  [facility] and [place] Number:  Herbalist and Address:  [facility], [place] [address], [place], [place] [address]      Provider Number: [id number]  Attending Physician Name and Address:  Altamese Kountze, MD  Relative Name and Phone Number:       Current Level of Care: Hospital Recommended Level of Care: [place] Prior Approval Number:    Date Approved/Denied:   PASRR Number: [id number]  Discharge Plan: SNF    Current Diagnoses: Patient Active Problem List   Diagnosis Date Noted  . Recurrent falls 04/16/2016  . Chronic pain   . Hypertension   . Neuropathy ([place])   . Osteopenia   . Vitamin D deficiency   . Olecranon fracture 04/15/2016  . Fall at home, initial encounter   . Fall   . Anemia   . Post-operative pain   . Fibromyalgia   . Uncomplicated asthma   . Tobacco abuse   . AKI (acute kidney injury) ([place])   . Leukocytosis   . Other secondary hypertension   . Hyperglycemia   . Acute blood loss anemia   . Thrombocytosis ([place])   . Closed fracture of left olecranon process   . Acute renal failure ([place]) 03/18/2016  . Acute encephalopathy 03/18/2016  . Elbow fracture, left, closed, initial encounter 03/18/2016  . Acute pain of left hip 12/23/2015  . UTI (urinary tract infection) 12/23/2015  . DNR (do not resuscitate) discussion 12/23/2015  . Palliative care by specialist 12/23/2015  . Chronic pain syndrome 12/23/2015  . Left hip pain 12/23/2015  . Effusion of left hip   . Lytic bone lesions on xray   . Exertional dyspnea 09/07/2012  . Varicose veins of both lower extremities with pain 08/27/2012  . Lower extremity edema 08/27/2012  . Abnormal resting ECG findings - sinus rhythm with PACs, and PAT 08/19/2012  . Aortic systolic murmur  on examination 08/19/2012  . PAT (paroxysmal atrial tachycardia) / MAT 08/19/2012    Orientation RESPIRATION BLADDER Height & Weight     Self, Time, Situation, Place  Normal Continent Weight:   Height:     BEHAVIORAL SYMPTOMS/MOOD NEUROLOGICAL BOWEL NUTRITION STATUS      Continent Diet (Please see DC Summary)  AMBULATORY STATUS COMMUNICATION OF NEEDS Skin   Limited Assist Verbally Surgical wounds (Closed incision on arm, hip, and elbow)                       Personal Care Assistance Level of Assistance  Bathing, Feeding, Dressing Bathing Assistance: Limited assistance Feeding assistance: Independent Dressing Assistance: Limited assistance     Functional Limitations Info             SPECIAL CARE FACTORS FREQUENCY  PT (By licensed PT), OT (By licensed OT)     PT Frequency: 5x/week OT Frequency: 3x/week            Contractures      Additional Factors Info  Code Status, Allergies, Psychotropic Code Status Info: Full Allergies Info: NKA Psychotropic Info: Zoloft         Current Medications (04/17/2016):  This is the current hospital active medication list Current Facility-Administered Medications  Medication Dose Route Frequency Provider Last Rate Last Dose  . 0.9 % NaCl with KCl 20 mEq/  L  infusion   Intravenous Continuous Ainsley Spinner, PA-C 50 mL/hr at 04/15/16 1240    . acetaminophen (TYLENOL) tablet 650 mg  650 mg Oral Q6H PRN Ainsley Spinner, PA-C       Or  . acetaminophen (TYLENOL) suppository 650 mg  650 mg Rectal Q6H PRN Ainsley Spinner, PA-C      . ALPRAZolam Duanne Moron) tablet 0.5-1 mg  0.5-1 mg Oral TID PRN Ainsley Spinner, PA-C   1 mg at 04/17/16 0254  . amLODipine (NORVASC) tablet 5 mg  5 mg Oral Daily Ainsley Spinner, PA-C   5 mg at 04/16/16 1000  . cholecalciferol (VITAMIN D) tablet 5,000 Units  5,000 Units Oral BID Ainsley Spinner, PA-C   5,000 Units at 04/16/16 2109  . enoxaparin (LOVENOX) injection 40 mg  40 mg Subcutaneous Q24H Ainsley Spinner, PA-C   40 mg at 04/16/16 0847   . fluticasone (FLONASE) 50 MCG/ACT nasal spray 2 spray  2 spray Each Nare Daily PRN Ainsley Spinner, PA-C   2 spray at 04/16/16 0044  . gabapentin (NEURONTIN) capsule 300 mg  300 mg Oral q12n4p Ainsley Spinner, PA-C   300 mg at 04/16/16 1600  . gabapentin (NEURONTIN) capsule 900 mg  900 mg Oral QHS Altamese , MD   900 mg at 04/16/16 2108  . HYDROcodone-acetaminophen (NORCO/VICODIN) 5-325 MG per tablet 1-2 tablet  1-2 tablet Oral Q6H PRN Ainsley Spinner, PA-C   2 tablet at 04/16/16 1534  . metoCLOPramide (REGLAN) tablet 5-10 mg  5-10 mg Oral Q8H PRN Ainsley Spinner, PA-C       Or  . metoCLOPramide (REGLAN) injection 5-10 mg  5-10 mg Intravenous Q8H PRN Ainsley Spinner, PA-C      . metoprolol tartrate (LOPRESSOR) tablet 25 mg  25 mg Oral BID Ainsley Spinner, PA-C   25 mg at 04/16/16 2108  . ondansetron (ZOFRAN) tablet 4 mg  4 mg Oral Q6H PRN Ainsley Spinner, PA-C       Or  . ondansetron [facility]) injection 4 mg  4 mg Intravenous Q6H PRN Ainsley Spinner, PA-C      . oxyCODONE (Oxy IR/ROXICODONE) immediate release tablet 30 mg  30 mg Oral Q4H PRN Ainsley Spinner, PA-C   30 mg at 04/17/16 2706  . sertraline (ZOLOFT) tablet 100 mg  100 mg Oral QHS Ainsley Spinner, PA-C   100 mg at 04/16/16 2108     Discharge Medications: Please see discharge summary for a list of discharge medications.  Relevant Imaging Results:  Relevant Lab Results:   Additional Information SSN: [id number]  Benard Halsted, LCSWA

## 2016-04-17 NOTE — Discharge Summary (Signed)
Patient ID: Shirley [name] MRN: [id number] DOB/AGE: 29-Sep-1940 76 y.o.  Admit date: 04/15/2016 Discharge date: 04/17/2016  Admission Diagnoses:  Principal Problem:   Olecranon fracture Active Problems:   Left hip pain   Closed fracture of left olecranon process   Fibromyalgia   Chronic pain   Hypertension   Neuropathy (HCC)   Osteopenia   Vitamin D deficiency   Recurrent falls   Discharge Diagnoses:  Same  Past Medical History:  Diagnosis Date  . Allergy    vasomotor rhinitis  . Anemia   . Asthma    h/o asthma and bronchitis  . BV (bacterial vaginosis)    history of frequent  . Cervical spondylosis    herniated disk protruding @ C6-7, impinging cord and left axillary root sleeve.  . Chronic pain   . Closed olecranon fracture, left, initial encounter   . Depression   . DVT of upper extremity (deep vein thrombosis) ([place])    h/o left(after wrist fracture/surgery); no residual thrombus or phlebitis by Dopplers July 2014  . Dyspnea    with exertion  . Edema   . Fibromyalgia    sees Dr.Phillips-pain management  . GERD (gastroesophageal reflux disease)    h/o  . Headache    marigraine- hormal - none in years  . Hypertension    never has been treated that daughter is aware  . Neuropathy ([place])   . Neuropathy ([place])   . Osteopenia    mild at hip (T-1.3)  . PAT (paroxysmal atrial tachycardia) ([place])   . Pneumonia    years ago  . Recurrent falls 04/16/2016  . Varicose veins   . 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 Eilleen Kempf @ [facility]  . Vision problem    wears glasses  . Vitamin D deficiency     Surgeries: Procedure(s): OPEN REDUCTION INTERNAL FIXATION (ORIF) LEFT ELBOW/OLECRANON FRACTURE LEFT HIP STEROID INJECTION on 04/15/2016   Consultants:    Discharged Condition: Improved  Hospital Course: Shirley [name] is an 76 y.o. female who was admitted 04/15/2016 for operative treatment ofOlecranon fracture. Patient has severe unremitting pain that affects sleep, daily activities, and work/hobbies. After pre-op clearance the patient was taken to the operating room on 04/15/2016 and underwent  Procedure(s): OPEN REDUCTION INTERNAL FIXATION (ORIF) LEFT ELBOW/OLECRANON FRACTURE LEFT HIP STEROID INJECTION.    Patient was given perioperative antibiotics: Anti-infectives    Start     Dose/Rate Route Frequency Ordered Stop   04/15/16 1400  ceFAZolin (ANCEF) IVPB 1 g/50 mL premix     1 g 100 mL/hr over 30 Minutes Intravenous Every 6 hours 04/15/16 1117 04/16/16 0304   04/15/16 0645  ceFAZolin (ANCEF) IVPB 2g/100 mL premix     2 g 200 mL/hr over 30 Minutes Intravenous On call to O.R. 04/15/16 5784 04/15/16 0820       Patient was given sequential compression devices, early ambulation, and chemoprophylaxis to prevent DVT.  Patient benefited maximally from hospital stay and there were no complications.    Recent vital signs: Patient Vitals for the past 24 hrs:  BP Temp Temp src Pulse Resp SpO2  04/17/16 0609 (!) 164/66 98.2 F (36.8 C) Oral (!) 55 - 97 %  04/16/16 2107 122/74 98.2 F (36.8 C) Oral 72 18 95 %  04/16/16  1500 138/77 98.1 F (36.7 C) Oral 63 18 96 %     Recent laboratory studies:  Recent Labs  04/15/16 0653 04/15/16 1236 04/16/16 0303 04/17/16 0315  WBC 6.9 8.8  --   --   HGB 9.2* 9.6*  --   --   HCT 31.1* 31.9*  --   --   PLT 643* 668*  --   --   NA 139  --  136 139  K 3.9  --  4.6 4.0  CL 102  --  103 105  CO2 27  --  27 27  BUN 26*  --  23* 22*  CREATININE 1.31* 1.34* 1.13* 1.06*  GLUCOSE 90  --  134* 158*  CALCIUM 9.1  --  8.5* 8.4*     Discharge Medications:   Allergies as of 04/17/2016      Reactions   No Known Allergies       Medication List    TAKE these medications   acetaminophen 325 MG  tablet Commonly known as:  TYLENOL Take 2 tablets (650 mg total) by mouth every 4 (four) hours as needed for mild pain (or temp > 37.5 C (99.5 F)).   ALPRAZolam 1 MG tablet Commonly known as:  XANAX Take 0.5-1 mg by mouth 3 (three) times daily as needed for anxiety or sleep.   amLODipine 5 MG tablet Commonly known as:  NORVASC Take 1 tablet (5 mg total) by mouth daily.   cetirizine 10 MG tablet Commonly known as:  ZYRTEC Take 10 mg by mouth daily as needed for allergies.   enoxaparin 40 MG/0.4ML injection Commonly known as:  LOVENOX Inject 0.4 mLs (40 mg total) into the skin daily.   fluticasone 50 MCG/ACT nasal spray Commonly known as:  FLONASE Place 2 sprays into both nostrils daily as needed for allergies or rhinitis.   gabapentin 300 MG capsule Commonly known as:  NEURONTIN Take 300-900 mg by mouth 3 (three) times daily. Take 300mg  in the am and afternoon, and then take 900mg  at night   metoprolol tartrate 25 MG tablet Commonly known as:  LOPRESSOR Take 1 tablet (25 mg total) by mouth 2 (two) times daily.   oxycodone 30 MG immediate release tablet Commonly known as:  ROXICODONE Take 1 tablet (30 mg total) by mouth every 4 (four) hours as needed for pain (for pain). Reported on 06/03/2015   sertraline 100 MG tablet Commonly known as:  ZOLOFT Take 100 mg by mouth at bedtime.   torsemide 10 MG tablet Commonly known as:  DEMADEX Take 20 mg by mouth daily.   Vitamin D3 5000 units Caps Take 5,000 Units by mouth 2 (two) times daily.       Diagnostic Studies: Dg Elbow 2 Views Left  Result Date: 04/15/2016 CLINICAL DATA:  Olecranon fracture. EXAM: LEFT ELBOW - 2 VIEW COMPARISON:  Fluoroscopic images 2 04/13/2016 FINDINGS: Plate and screw fixation of olecranon fracture. The alignment is anatomic. Bony details are obscured by cast material. IMPRESSION: Plate and screw fixation of olecranon fracture with good anatomic alignment. Electronically Signed   By: [name] M.D.   On: 04/15/2016 10:44   Dg Elbow 2 Views Left  Result Date: 04/15/2016 CLINICAL DATA:  ORIF. EXAM: LEFT ELBOW - 2 VIEW; DG C-ARM 61-120 MIN COMPARISON:  03/22/2016. FINDINGS: ORIF left ulna. Hardware intact. Anatomic alignment. Similar findings noted on prior exam . IMPRESSION: ORIF left ulna. Electronically Signed   By: Marcello Moores  Register   On: 04/15/2016 10:16  Dg Elbow 2 Views Left  Result Date: 03/22/2016 CLINICAL DATA:  Postop ORIF EXAM: LEFT ELBOW - 2 VIEW COMPARISON:  Intraoperative fluoroscopic radiographs dated 03/22/2016 FINDINGS: Compression plate and screw fixation of an olecranon fracture. Fracture fragments are in near anatomic alignment and position. Overlying cast obscures fine osseous detail. IMPRESSION: Status post ORIF of an olecranon fracture, as above. Electronically Signed   By: Julian Hy M.D.   On: 03/22/2016 16:34   Dg Elbow 2 Views Left  Result Date: 03/19/2016 CLINICAL DATA:  History of olecranon fracture status post casting EXAM: LEFT ELBOW - 2 VIEW COMPARISON:  03/17/2016 FINDINGS: Olecranon fracture is again identified with some distraction of the fracture fragments. No other focal abnormality is seen. Casting material is noted posteriorly. IMPRESSION: Stable olecranon fracture Electronically Signed   By: Inez Catalina M.D.   On: 03/19/2016 08:42   Dg Elbow Complete Left  Result Date: 03/22/2016 CLINICAL DATA:  Olecranon fracture due to a fall 03/17/2016. Intraoperative imaging for open reduction and internal fixation. EXAM: LEFT ELBOW - COMPLETE 3+ VIEW; DG C-ARM 61-120 MIN COMPARISON:  Plain films of the left elbow 03/18/2016. FINDINGS: We are provided with 5 fluoroscopic intraoperative spot views of the left elbow. Images demonstrate placement of posterior plate and screws for fixation of an olecranon fracture. Position and alignment are anatomic. Hardware is intact. No new abnormality. IMPRESSION: ORIF left olecranon fracture.  No acute finding.  Electronically Signed   By: Inge Rise M.D.   On: 03/22/2016 15:56   Dg C-arm 1-60 Min  Result Date: 04/15/2016 CLINICAL DATA:  ORIF. EXAM: LEFT ELBOW - 2 VIEW; DG C-ARM 61-120 MIN COMPARISON:  03/22/2016. FINDINGS: ORIF left ulna. Hardware intact. Anatomic alignment. Similar findings noted on prior exam . IMPRESSION: ORIF left ulna. Electronically Signed   By: Marcello Moores  Register   On: 04/15/2016 10:16   Dg C-arm 1-60 Min  Result Date: 03/22/2016 CLINICAL DATA:  Olecranon fracture due to a fall 03/17/2016. Intraoperative imaging for open reduction and internal fixation. EXAM: LEFT ELBOW - COMPLETE 3+ VIEW; DG C-ARM 61-120 MIN COMPARISON:  Plain films of the left elbow 03/18/2016. FINDINGS: We are provided with 5 fluoroscopic intraoperative spot views of the left elbow. Images demonstrate placement of posterior plate and screws for fixation of an olecranon fracture. Position and alignment are anatomic. Hardware is intact. No new abnormality. IMPRESSION: ORIF left olecranon fracture.  No acute finding. Electronically Signed   By: Inge Rise M.D.   On: 03/22/2016 15:56   Dg Hip Operative Unilat W Or W/o Pelvis Left  Result Date: 04/15/2016 CLINICAL DATA:  Left hip steroid injection for osteoarthritis. EXAM: OPERATIVE LEFT HIP (WITH PELVIS IF PERFORMED) TECHNIQUE: Fluoroscopic spot image(s) were submitted for interpretation post-operatively. COMPARISON:  Pelvis radiograph 03/24/2015 FINDINGS: Single fluoroscopic image from left hip steroid injection is provided for interpretation. The needle is seen overlying the lower outer quadrant of the left femoral head. Small amount of contrast is seen within the left hip joint space. Fluoroscopy time is recorded as 6 seconds. IMPRESSION: Successful puncture of the left hip joint space. Electronically Signed   By: [name] M.D.   On: 04/15/2016 10:50    Disposition: 06-Home-Health Care Svc  Discharge Instructions    Diet - low sodium heart  healthy    Complete by:  As directed    Discharge instructions    Complete by:  As directed    Elbow Fracture Care After Refer to this sheet in the next few weeks.  These discharge instructions provide you with general information on caring for yourself after you leave the hospital. Your caregiver may also give you specific instructions. Your treatment has been planned according to the most current medical practices available, but unavoidable complications sometimes occur. If you have any problems or questions after discharge, please call your caregiver. HOME INSTRUCTIONS You may resume a normal diet and activities as directed.  NON WEIGHT BEARING on left upper extremity Do NOT get cast wet.  Do NOT remove dressing until you come back to the doctor Only take over-the-counter or prescription medicines for pain, discomfort, or fever as directed by your caregiver.  Eat a well-balanced diet.  Avoid lifting or driving until you are instructed otherwise.  Make an appointment to see your caregiver for stitches (suture) or staple removal as directed.   SEEK MEDICAL CARE IF: You have swelling of your calf or leg.  You develop shortness of breath or chest pain.  You have redness, swelling, or increasing pain in the wound.  There is pus or any unusual drainage coming from the surgical site.  You notice a bad smell coming from the surgical site or dressing.  The surgical site breaks open after sutures or staples have been removed.  There is persistent bleeding from the suture or staple line.  You are getting worse or are not improving.  You have any other questions or concerns.  SEEK IMMEDIATE MEDICAL CARE IF:  You have a fever.  You develop a rash.  You have difficulty breathing.  You develop any reaction or side effects to medicines given.  Your knee motion is decreasing rather than improving.  MAKE SURE YOU:  Understand these instructions.  Will watch your condition.  Will get help right away  if you are not doing well or get worse.   Increase activity slowly    Complete by:  As directed       Follow up with Dr Marcelino Scot in 1-2 weeks.   SignedLinda Hedges 04/17/2016, 9:04 AM

## 2016-04-17 NOTE — Clinical Social Work Placement (Signed)
   CLINICAL SOCIAL WORK PLACEMENT  NOTE  Date:  04/17/2016  Patient Details  Name: Shirley [name] MRN: [id number] Date of Birth: 10-Jul-1940  Clinical Social Work is seeking post-discharge placement for this patient at the [place] level of care (*CSW will initial, date and re-position this form in  chart as items are completed):  Yes   Patient/family provided with [facility] Work Department's list of facilities offering this level of care within the geographic area requested by the patient (or if unable, by the patient's family).  Yes   Patient/family informed of their freedom to choose among providers that offer the needed level of care, that participate in Medicare, Medicaid or managed care program needed by the patient, have an available bed and are willing to accept the patient.  Yes   Patient/family informed of [facility]'s ownership interest in [facility] and [facility], as well as of the fact that they are under no obligation to receive care at these facilities.  PASRR submitted to EDS on       PASRR number received on       Existing PASRR number confirmed on 04/17/16     FL2 transmitted to all facilities in geographic area requested by pt/family on 04/17/16     FL2 transmitted to all facilities within larger geographic area on       Patient informed that his/her managed care company has contracts with or will negotiate with certain facilities, including the following:        Yes   Patient/family informed of bed offers received.  Patient chooses bed at [facility]     Physician recommends and patient chooses bed at      Patient to be transferred to [facility] on 04/17/16.  Patient to be transferred to facility by [facility]     Patient family notified on 04/17/16 of transfer.  Name of family member notified:  Daughter     PHYSICIAN Please sign FL2     Additional Comment:     _______________________________________________ Benard Halsted, [place] 04/17/2016, 10:58 AM

## 2016-04-17 NOTE — Clinical Social Work Note (Signed)
Clinical Social Work Assessment  Patient Details  Name: Shirley [name] MRN: [id number] Date of Birth: Sep 28, 1940  Date of referral:  04/17/16               Reason for consult:  Facility Placement                Permission sought to share information with:  Facility Sport and exercise psychologist, Family Supports Permission granted to share information::  Yes, Verbal Permission Granted  Name::        Agency::  SNFs  Relationship::     Contact Information:     Housing/Transportation Living arrangements for the past 2 months:  Single Family Home Source of Information:  Patient Patient Interpreter Needed:  None Criminal Activity/Legal Involvement Pertinent to Current Situation/Hospitalization:  No - Comment as needed Significant Relationships:  Adult Children, Spouse Lives with:  Spouse Do you feel safe going back to the place where you live?  No Need for family participation in patient care:  No (Coment)  Care giving concerns:  CSW received consult for possible SNF placement at time of discharge. CSW spoke with patient regarding PT recommendation of SNF placement at time of discharge. Patient reported that patient's spouse is currently unable to care for patient at their home given patient's current physical needs and fall risk. Patient expressed understanding of PT recommendation and is agreeable to SNF placement at time of discharge. CSW to continue to follow and assist with discharge planning needs.   Social Worker assessment / plan:  CSW spoke with patient concerning possibility of rehab at [facility] before returning home.  Employment status:  Retired Nurse, adult PT Recommendations:  [facility] / Referral to community resources:  [place]  Patient/Family's Response to care:  Patient recognizes need for rehab before returning home and is agreeable to a SNF in [place]. Patient reported preference for [facility].  Patient/Family's Understanding of and Emotional Response to Diagnosis, Current Treatment, and Prognosis:  Patient/family is realistic regarding therapy needs and expressed being hopeful for SNF placement. Patient expressed understanding of CSW role and discharge process. No questions/concerns about plan or treatment.    Emotional Assessment Appearance:  Appears stated age Attitude/Demeanor/Rapport:  Other (Appropriate) Affect (typically observed):  Accepting, Adaptable, Appropriate, Pleasant Orientation:  Oriented to Self, Oriented to Place, Oriented to  Time, Oriented to Situation Alcohol / Substance use:  Not Applicable Psych involvement (Current and /or in the community):  No (Comment)  Discharge Needs  Concerns to be addressed:  Care Coordination Readmission within the last 30 days:  Yes Current discharge risk:  None Barriers to Discharge:  No Barriers Identified   Benard Halsted, [place] 04/17/2016, 9:08 AM

## 2016-04-20 NOTE — Op Note (Deleted)
  The note originally documented on this encounter has been moved the the encounter in which it belongs.  

## 2016-04-20 NOTE — Op Note (Signed)
NAMESCHERRY, Shirley [name] NO.:  [id number]  MEDICAL RECORD NO.:  [id number]  LOCATION:                               FACILITY:  [facility]  PHYSICIAN:  Astrid Divine. Marcelino Scot, M.D. DATE OF BIRTH:  May 19, 1940  DATE OF PROCEDURE:  04/15/2016 DATE OF DISCHARGE:                              OPERATIVE REPORT   PREOPERATIVE DIAGNOSIS:   1. Avulsion of triceps insertion and re-fracture of left olecranon. 2. Left hip degenerative joint disease.  POSTOPERATIVE DIAGNOSIS:   1. Avulsion of triceps insertion and re-fracture of left olecranon. 2. Left hip degenerative joint disease.  PROCEDURE:   1. Repair of triceps tendon avulsion. 2. Injection of left hip joint with fluoroscopic guidance.  SURGEON:  Astrid Divine. Marcelino Scot, M.D.  ASSISTANT:  Hilbert Odor, PA-C.  ANESTHESIA:  General.  COMPLICATIONS:  None.  TOURNIQUET:  None.  DISPOSITION:  To PACU.  CONDITION:  Stable.  BRIEF SUMMARY AND INDICATION FOR PROCEDURE:  Shirley [name] is a 76 year old who underwent ORIF of her left olecranon about 3 weeks ago.  The patient sustained several falls at home and after the most recent one, complained of left elbow pain.  We saw and evaluated her in the office where x-rays demonstrated that she had a fracture around the proximal aspect of the hardware and that the triceps mechanism had retracted with this fragment of bone.  She also complained of left hip DJD, asserting that this arthritic pain had contributed directly to her fall and requesting intraarticular steroid injection to reduce her symptoms and subsequent risk of falling. I discussed with her and her daughter the risks and benefits of that hip injection as well as surgical repair including potential for failure of the repair, loss of reduction, loss of elbow extension, infection, which is greater than this revision procedure as well as heart attack, stroke, DVT, or complications related to her longstanding use of opioids.   After full discussion of these risks, they did strongly wish to proceed with both procedures.  BRIEF SUMMARY OF PROCEDURE:  The patient was taken to the operating room where general anesthesia was induced.  Her left elbow was prepped and draped in the usual sterile fashion.  I remade the most proximal aspect of the old incision and did have to extend this proximally as well.  The Retracted triceps tendon and associated fracture fragments were identified.  The fracture hematoma removed with suction and then, two #2 FiberWires used to secure the triceps and bone fragments with a Krackow stitch.  One of these was placed centrally and one more toward the outer border of the triceps.  Outstanding control of the triceps was obtained and then the suture brought out through and around these fragments.  It was then tied over both the bone and the screws distally.  A knot was left on either side, so there would not be too much bulk on either the medial or lateral side of the ulna at the cluster of screws extending up into the coronoid.  The elbow was placed into extension during this repair and then the elbow was taken through a range of motion with outstanding control of the fragments.  Wounds were irrigated thoroughly and then the soft tissues reapproximated with 0 Vicryl for the paratenon, 2-0 Vicryl for the subcu, and nylon for the skin.  Sterile gently compressive dressing was applied.  Shirley Gunner, PA-C assisted me throughout.  Attention was then turned to the left hip. C-arm was brought in and the correct starting point identified with an instrument. This area was then prepped in standard sterile fashion and an 18 gauge spinal needle inserted into the capsule. I then pushed a cc of diluted contrast into the joint confirming intracapsular placement within the hip joint. Marcaine and 80mg  of depomedrol were injected. The patient was awakened from anesthesia and transported to PACU in stable  condition.  PROGNOSIS:  Shirley [name] remains at elevated risk for complications that she is at substantial fall risk.  She would really benefit from inpatient rehab to work on her balance and gait while she remains without the use of her left arm for assistive devices.  Clearly at increased risk for complications such as associated with falling in addition to high opioid use, mental status changes, and infection given revision surgery.     Astrid Divine. Marcelino Scot, M.D.   ______________________________ Astrid Divine. Marcelino Scot, M.D.    MHH/MEDQ  D:  04/20/2016  T:  04/20/2016  Job:  [id number]

## 2016-04-23 DIAGNOSIS — G8929 Other chronic pain: Secondary | ICD-10-CM | POA: Diagnosis not present

## 2016-04-23 DIAGNOSIS — R296 Repeated falls: Secondary | ICD-10-CM | POA: Diagnosis not present

## 2016-04-23 DIAGNOSIS — I10 Essential (primary) hypertension: Secondary | ICD-10-CM | POA: Diagnosis not present

## 2016-04-23 DIAGNOSIS — S52025D Nondisplaced fracture of olecranon process without intraarticular extension of left ulna, subsequent encounter for closed fracture with routine healing: Secondary | ICD-10-CM | POA: Diagnosis not present

## 2016-04-23 DIAGNOSIS — F329 Major depressive disorder, single episode, unspecified: Secondary | ICD-10-CM | POA: Diagnosis not present

## 2016-04-23 DIAGNOSIS — M797 Fibromyalgia: Secondary | ICD-10-CM | POA: Diagnosis not present

## 2016-04-23 DIAGNOSIS — F17210 Nicotine dependence, cigarettes, uncomplicated: Secondary | ICD-10-CM | POA: Diagnosis not present

## 2016-04-23 DIAGNOSIS — Z96651 Presence of right artificial knee joint: Secondary | ICD-10-CM | POA: Diagnosis not present

## 2016-04-23 DIAGNOSIS — N179 Acute kidney failure, unspecified: Secondary | ICD-10-CM | POA: Diagnosis not present

## 2016-04-23 DIAGNOSIS — Z9181 History of falling: Secondary | ICD-10-CM | POA: Diagnosis not present

## 2016-04-23 DIAGNOSIS — Z86718 Personal history of other venous thrombosis and embolism: Secondary | ICD-10-CM | POA: Diagnosis not present

## 2016-04-23 DIAGNOSIS — D500 Iron deficiency anemia secondary to blood loss (chronic): Secondary | ICD-10-CM | POA: Diagnosis not present

## 2016-04-26 DIAGNOSIS — F17210 Nicotine dependence, cigarettes, uncomplicated: Secondary | ICD-10-CM | POA: Diagnosis not present

## 2016-04-26 DIAGNOSIS — Z9181 History of falling: Secondary | ICD-10-CM | POA: Diagnosis not present

## 2016-04-26 DIAGNOSIS — Z96651 Presence of right artificial knee joint: Secondary | ICD-10-CM | POA: Diagnosis not present

## 2016-04-26 DIAGNOSIS — F329 Major depressive disorder, single episode, unspecified: Secondary | ICD-10-CM | POA: Diagnosis not present

## 2016-04-26 DIAGNOSIS — I10 Essential (primary) hypertension: Secondary | ICD-10-CM | POA: Diagnosis not present

## 2016-04-26 DIAGNOSIS — M797 Fibromyalgia: Secondary | ICD-10-CM | POA: Diagnosis not present

## 2016-04-26 DIAGNOSIS — R296 Repeated falls: Secondary | ICD-10-CM | POA: Diagnosis not present

## 2016-04-26 DIAGNOSIS — G8929 Other chronic pain: Secondary | ICD-10-CM | POA: Diagnosis not present

## 2016-04-26 DIAGNOSIS — D500 Iron deficiency anemia secondary to blood loss (chronic): Secondary | ICD-10-CM | POA: Diagnosis not present

## 2016-04-26 DIAGNOSIS — Z86718 Personal history of other venous thrombosis and embolism: Secondary | ICD-10-CM | POA: Diagnosis not present

## 2016-04-26 DIAGNOSIS — N179 Acute kidney failure, unspecified: Secondary | ICD-10-CM | POA: Diagnosis not present

## 2016-04-26 DIAGNOSIS — S52025D Nondisplaced fracture of olecranon process without intraarticular extension of left ulna, subsequent encounter for closed fracture with routine healing: Secondary | ICD-10-CM | POA: Diagnosis not present

## 2016-04-27 DIAGNOSIS — R296 Repeated falls: Secondary | ICD-10-CM | POA: Diagnosis not present

## 2016-04-27 DIAGNOSIS — M797 Fibromyalgia: Secondary | ICD-10-CM | POA: Diagnosis not present

## 2016-04-27 DIAGNOSIS — Z9181 History of falling: Secondary | ICD-10-CM | POA: Diagnosis not present

## 2016-04-27 DIAGNOSIS — D500 Iron deficiency anemia secondary to blood loss (chronic): Secondary | ICD-10-CM | POA: Diagnosis not present

## 2016-04-27 DIAGNOSIS — F17210 Nicotine dependence, cigarettes, uncomplicated: Secondary | ICD-10-CM | POA: Diagnosis not present

## 2016-04-27 DIAGNOSIS — S52025D Nondisplaced fracture of olecranon process without intraarticular extension of left ulna, subsequent encounter for closed fracture with routine healing: Secondary | ICD-10-CM | POA: Diagnosis not present

## 2016-04-27 DIAGNOSIS — N179 Acute kidney failure, unspecified: Secondary | ICD-10-CM | POA: Diagnosis not present

## 2016-04-27 DIAGNOSIS — I10 Essential (primary) hypertension: Secondary | ICD-10-CM | POA: Diagnosis not present

## 2016-04-27 DIAGNOSIS — F329 Major depressive disorder, single episode, unspecified: Secondary | ICD-10-CM | POA: Diagnosis not present

## 2016-04-27 DIAGNOSIS — Z86718 Personal history of other venous thrombosis and embolism: Secondary | ICD-10-CM | POA: Diagnosis not present

## 2016-04-27 DIAGNOSIS — G8929 Other chronic pain: Secondary | ICD-10-CM | POA: Diagnosis not present

## 2016-04-27 DIAGNOSIS — Z96651 Presence of right artificial knee joint: Secondary | ICD-10-CM | POA: Diagnosis not present

## 2016-04-28 DIAGNOSIS — R296 Repeated falls: Secondary | ICD-10-CM | POA: Diagnosis not present

## 2016-04-28 DIAGNOSIS — S52025D Nondisplaced fracture of olecranon process without intraarticular extension of left ulna, subsequent encounter for closed fracture with routine healing: Secondary | ICD-10-CM | POA: Diagnosis not present

## 2016-04-28 DIAGNOSIS — I10 Essential (primary) hypertension: Secondary | ICD-10-CM | POA: Diagnosis not present

## 2016-04-28 DIAGNOSIS — G8929 Other chronic pain: Secondary | ICD-10-CM | POA: Diagnosis not present

## 2016-04-28 DIAGNOSIS — D500 Iron deficiency anemia secondary to blood loss (chronic): Secondary | ICD-10-CM | POA: Diagnosis not present

## 2016-04-28 DIAGNOSIS — F17210 Nicotine dependence, cigarettes, uncomplicated: Secondary | ICD-10-CM | POA: Diagnosis not present

## 2016-04-28 DIAGNOSIS — Z9181 History of falling: Secondary | ICD-10-CM | POA: Diagnosis not present

## 2016-04-28 DIAGNOSIS — N179 Acute kidney failure, unspecified: Secondary | ICD-10-CM | POA: Diagnosis not present

## 2016-04-28 DIAGNOSIS — F329 Major depressive disorder, single episode, unspecified: Secondary | ICD-10-CM | POA: Diagnosis not present

## 2016-04-28 DIAGNOSIS — M797 Fibromyalgia: Secondary | ICD-10-CM | POA: Diagnosis not present

## 2016-04-28 DIAGNOSIS — Z86718 Personal history of other venous thrombosis and embolism: Secondary | ICD-10-CM | POA: Diagnosis not present

## 2016-04-28 DIAGNOSIS — Z96651 Presence of right artificial knee joint: Secondary | ICD-10-CM | POA: Diagnosis not present

## 2016-04-30 DIAGNOSIS — R296 Repeated falls: Secondary | ICD-10-CM | POA: Diagnosis not present

## 2016-04-30 DIAGNOSIS — M797 Fibromyalgia: Secondary | ICD-10-CM | POA: Diagnosis not present

## 2016-04-30 DIAGNOSIS — F329 Major depressive disorder, single episode, unspecified: Secondary | ICD-10-CM | POA: Diagnosis not present

## 2016-04-30 DIAGNOSIS — I10 Essential (primary) hypertension: Secondary | ICD-10-CM | POA: Diagnosis not present

## 2016-04-30 DIAGNOSIS — Z96651 Presence of right artificial knee joint: Secondary | ICD-10-CM | POA: Diagnosis not present

## 2016-04-30 DIAGNOSIS — Z9181 History of falling: Secondary | ICD-10-CM | POA: Diagnosis not present

## 2016-04-30 DIAGNOSIS — D500 Iron deficiency anemia secondary to blood loss (chronic): Secondary | ICD-10-CM | POA: Diagnosis not present

## 2016-04-30 DIAGNOSIS — F17210 Nicotine dependence, cigarettes, uncomplicated: Secondary | ICD-10-CM | POA: Diagnosis not present

## 2016-04-30 DIAGNOSIS — G8929 Other chronic pain: Secondary | ICD-10-CM | POA: Diagnosis not present

## 2016-04-30 DIAGNOSIS — Z86718 Personal history of other venous thrombosis and embolism: Secondary | ICD-10-CM | POA: Diagnosis not present

## 2016-04-30 DIAGNOSIS — N179 Acute kidney failure, unspecified: Secondary | ICD-10-CM | POA: Diagnosis not present

## 2016-04-30 DIAGNOSIS — S52025D Nondisplaced fracture of olecranon process without intraarticular extension of left ulna, subsequent encounter for closed fracture with routine healing: Secondary | ICD-10-CM | POA: Diagnosis not present

## 2016-05-03 DIAGNOSIS — S52032D Displaced fracture of olecranon process with intraarticular extension of left ulna, subsequent encounter for closed fracture with routine healing: Secondary | ICD-10-CM | POA: Diagnosis not present

## 2016-05-04 DIAGNOSIS — S52025D Nondisplaced fracture of olecranon process without intraarticular extension of left ulna, subsequent encounter for closed fracture with routine healing: Secondary | ICD-10-CM | POA: Diagnosis not present

## 2016-05-04 DIAGNOSIS — R296 Repeated falls: Secondary | ICD-10-CM | POA: Diagnosis not present

## 2016-05-04 DIAGNOSIS — Z86718 Personal history of other venous thrombosis and embolism: Secondary | ICD-10-CM | POA: Diagnosis not present

## 2016-05-04 DIAGNOSIS — I10 Essential (primary) hypertension: Secondary | ICD-10-CM | POA: Diagnosis not present

## 2016-05-04 DIAGNOSIS — F329 Major depressive disorder, single episode, unspecified: Secondary | ICD-10-CM | POA: Diagnosis not present

## 2016-05-04 DIAGNOSIS — Z96651 Presence of right artificial knee joint: Secondary | ICD-10-CM | POA: Diagnosis not present

## 2016-05-04 DIAGNOSIS — M797 Fibromyalgia: Secondary | ICD-10-CM | POA: Diagnosis not present

## 2016-05-04 DIAGNOSIS — D500 Iron deficiency anemia secondary to blood loss (chronic): Secondary | ICD-10-CM | POA: Diagnosis not present

## 2016-05-04 DIAGNOSIS — Z9181 History of falling: Secondary | ICD-10-CM | POA: Diagnosis not present

## 2016-05-04 DIAGNOSIS — F17210 Nicotine dependence, cigarettes, uncomplicated: Secondary | ICD-10-CM | POA: Diagnosis not present

## 2016-05-04 DIAGNOSIS — G8929 Other chronic pain: Secondary | ICD-10-CM | POA: Diagnosis not present

## 2016-05-04 DIAGNOSIS — N179 Acute kidney failure, unspecified: Secondary | ICD-10-CM | POA: Diagnosis not present

## 2016-05-06 DIAGNOSIS — Z86718 Personal history of other venous thrombosis and embolism: Secondary | ICD-10-CM | POA: Diagnosis not present

## 2016-05-06 DIAGNOSIS — S52025D Nondisplaced fracture of olecranon process without intraarticular extension of left ulna, subsequent encounter for closed fracture with routine healing: Secondary | ICD-10-CM | POA: Diagnosis not present

## 2016-05-06 DIAGNOSIS — F17210 Nicotine dependence, cigarettes, uncomplicated: Secondary | ICD-10-CM | POA: Diagnosis not present

## 2016-05-06 DIAGNOSIS — M797 Fibromyalgia: Secondary | ICD-10-CM | POA: Diagnosis not present

## 2016-05-06 DIAGNOSIS — G8929 Other chronic pain: Secondary | ICD-10-CM | POA: Diagnosis not present

## 2016-05-06 DIAGNOSIS — Z96651 Presence of right artificial knee joint: Secondary | ICD-10-CM | POA: Diagnosis not present

## 2016-05-06 DIAGNOSIS — N179 Acute kidney failure, unspecified: Secondary | ICD-10-CM | POA: Diagnosis not present

## 2016-05-06 DIAGNOSIS — F329 Major depressive disorder, single episode, unspecified: Secondary | ICD-10-CM | POA: Diagnosis not present

## 2016-05-06 DIAGNOSIS — I10 Essential (primary) hypertension: Secondary | ICD-10-CM | POA: Diagnosis not present

## 2016-05-06 DIAGNOSIS — D500 Iron deficiency anemia secondary to blood loss (chronic): Secondary | ICD-10-CM | POA: Diagnosis not present

## 2016-05-06 DIAGNOSIS — Z9181 History of falling: Secondary | ICD-10-CM | POA: Diagnosis not present

## 2016-05-06 DIAGNOSIS — R296 Repeated falls: Secondary | ICD-10-CM | POA: Diagnosis not present

## 2016-05-07 DIAGNOSIS — N179 Acute kidney failure, unspecified: Secondary | ICD-10-CM | POA: Diagnosis not present

## 2016-05-07 DIAGNOSIS — F329 Major depressive disorder, single episode, unspecified: Secondary | ICD-10-CM | POA: Diagnosis not present

## 2016-05-07 DIAGNOSIS — S52025D Nondisplaced fracture of olecranon process without intraarticular extension of left ulna, subsequent encounter for closed fracture with routine healing: Secondary | ICD-10-CM | POA: Diagnosis not present

## 2016-05-07 DIAGNOSIS — Z86718 Personal history of other venous thrombosis and embolism: Secondary | ICD-10-CM | POA: Diagnosis not present

## 2016-05-07 DIAGNOSIS — Z96651 Presence of right artificial knee joint: Secondary | ICD-10-CM | POA: Diagnosis not present

## 2016-05-07 DIAGNOSIS — R296 Repeated falls: Secondary | ICD-10-CM | POA: Diagnosis not present

## 2016-05-07 DIAGNOSIS — Z9181 History of falling: Secondary | ICD-10-CM | POA: Diagnosis not present

## 2016-05-07 DIAGNOSIS — F17210 Nicotine dependence, cigarettes, uncomplicated: Secondary | ICD-10-CM | POA: Diagnosis not present

## 2016-05-07 DIAGNOSIS — G8929 Other chronic pain: Secondary | ICD-10-CM | POA: Diagnosis not present

## 2016-05-07 DIAGNOSIS — I10 Essential (primary) hypertension: Secondary | ICD-10-CM | POA: Diagnosis not present

## 2016-05-07 DIAGNOSIS — D500 Iron deficiency anemia secondary to blood loss (chronic): Secondary | ICD-10-CM | POA: Diagnosis not present

## 2016-05-07 DIAGNOSIS — M797 Fibromyalgia: Secondary | ICD-10-CM | POA: Diagnosis not present

## 2016-05-13 DIAGNOSIS — N179 Acute kidney failure, unspecified: Secondary | ICD-10-CM | POA: Diagnosis not present

## 2016-05-13 DIAGNOSIS — S52025D Nondisplaced fracture of olecranon process without intraarticular extension of left ulna, subsequent encounter for closed fracture with routine healing: Secondary | ICD-10-CM | POA: Diagnosis not present

## 2016-05-13 DIAGNOSIS — G8929 Other chronic pain: Secondary | ICD-10-CM | POA: Diagnosis not present

## 2016-05-13 DIAGNOSIS — R296 Repeated falls: Secondary | ICD-10-CM | POA: Diagnosis not present

## 2016-05-13 DIAGNOSIS — I10 Essential (primary) hypertension: Secondary | ICD-10-CM | POA: Diagnosis not present

## 2016-05-13 DIAGNOSIS — Z96651 Presence of right artificial knee joint: Secondary | ICD-10-CM | POA: Diagnosis not present

## 2016-05-13 DIAGNOSIS — F329 Major depressive disorder, single episode, unspecified: Secondary | ICD-10-CM | POA: Diagnosis not present

## 2016-05-13 DIAGNOSIS — M797 Fibromyalgia: Secondary | ICD-10-CM | POA: Diagnosis not present

## 2016-05-13 DIAGNOSIS — D500 Iron deficiency anemia secondary to blood loss (chronic): Secondary | ICD-10-CM | POA: Diagnosis not present

## 2016-05-13 DIAGNOSIS — Z9181 History of falling: Secondary | ICD-10-CM | POA: Diagnosis not present

## 2016-05-13 DIAGNOSIS — F17210 Nicotine dependence, cigarettes, uncomplicated: Secondary | ICD-10-CM | POA: Diagnosis not present

## 2016-05-13 DIAGNOSIS — Z86718 Personal history of other venous thrombosis and embolism: Secondary | ICD-10-CM | POA: Diagnosis not present

## 2016-05-14 DIAGNOSIS — N179 Acute kidney failure, unspecified: Secondary | ICD-10-CM | POA: Diagnosis not present

## 2016-05-14 DIAGNOSIS — F17210 Nicotine dependence, cigarettes, uncomplicated: Secondary | ICD-10-CM | POA: Diagnosis not present

## 2016-05-14 DIAGNOSIS — F329 Major depressive disorder, single episode, unspecified: Secondary | ICD-10-CM | POA: Diagnosis not present

## 2016-05-14 DIAGNOSIS — I10 Essential (primary) hypertension: Secondary | ICD-10-CM | POA: Diagnosis not present

## 2016-05-14 DIAGNOSIS — Z9181 History of falling: Secondary | ICD-10-CM | POA: Diagnosis not present

## 2016-05-14 DIAGNOSIS — R296 Repeated falls: Secondary | ICD-10-CM | POA: Diagnosis not present

## 2016-05-14 DIAGNOSIS — Z96651 Presence of right artificial knee joint: Secondary | ICD-10-CM | POA: Diagnosis not present

## 2016-05-14 DIAGNOSIS — D500 Iron deficiency anemia secondary to blood loss (chronic): Secondary | ICD-10-CM | POA: Diagnosis not present

## 2016-05-14 DIAGNOSIS — M797 Fibromyalgia: Secondary | ICD-10-CM | POA: Diagnosis not present

## 2016-05-14 DIAGNOSIS — S52025D Nondisplaced fracture of olecranon process without intraarticular extension of left ulna, subsequent encounter for closed fracture with routine healing: Secondary | ICD-10-CM | POA: Diagnosis not present

## 2016-05-14 DIAGNOSIS — G8929 Other chronic pain: Secondary | ICD-10-CM | POA: Diagnosis not present

## 2016-05-14 DIAGNOSIS — Z86718 Personal history of other venous thrombosis and embolism: Secondary | ICD-10-CM | POA: Diagnosis not present

## 2016-05-25 DIAGNOSIS — D500 Iron deficiency anemia secondary to blood loss (chronic): Secondary | ICD-10-CM | POA: Diagnosis not present

## 2016-05-25 DIAGNOSIS — S52025D Nondisplaced fracture of olecranon process without intraarticular extension of left ulna, subsequent encounter for closed fracture with routine healing: Secondary | ICD-10-CM | POA: Diagnosis not present

## 2016-05-25 DIAGNOSIS — F329 Major depressive disorder, single episode, unspecified: Secondary | ICD-10-CM | POA: Diagnosis not present

## 2016-05-25 DIAGNOSIS — G8929 Other chronic pain: Secondary | ICD-10-CM | POA: Diagnosis not present

## 2016-05-25 DIAGNOSIS — Z86718 Personal history of other venous thrombosis and embolism: Secondary | ICD-10-CM | POA: Diagnosis not present

## 2016-05-25 DIAGNOSIS — R296 Repeated falls: Secondary | ICD-10-CM | POA: Diagnosis not present

## 2016-05-25 DIAGNOSIS — Z96651 Presence of right artificial knee joint: Secondary | ICD-10-CM | POA: Diagnosis not present

## 2016-05-25 DIAGNOSIS — F17210 Nicotine dependence, cigarettes, uncomplicated: Secondary | ICD-10-CM | POA: Diagnosis not present

## 2016-05-25 DIAGNOSIS — I10 Essential (primary) hypertension: Secondary | ICD-10-CM | POA: Diagnosis not present

## 2016-05-25 DIAGNOSIS — M797 Fibromyalgia: Secondary | ICD-10-CM | POA: Diagnosis not present

## 2016-05-25 DIAGNOSIS — N179 Acute kidney failure, unspecified: Secondary | ICD-10-CM | POA: Diagnosis not present

## 2016-05-25 DIAGNOSIS — Z9181 History of falling: Secondary | ICD-10-CM | POA: Diagnosis not present

## 2016-05-26 DIAGNOSIS — S52032D Displaced fracture of olecranon process with intraarticular extension of left ulna, subsequent encounter for closed fracture with routine healing: Secondary | ICD-10-CM | POA: Diagnosis not present

## 2016-05-28 ENCOUNTER — Ambulatory Visit: Payer: PPO | Admitting: Cardiology

## 2016-06-10 DIAGNOSIS — I10 Essential (primary) hypertension: Secondary | ICD-10-CM | POA: Diagnosis not present

## 2016-06-10 DIAGNOSIS — R296 Repeated falls: Secondary | ICD-10-CM | POA: Diagnosis not present

## 2016-06-10 DIAGNOSIS — Z96651 Presence of right artificial knee joint: Secondary | ICD-10-CM | POA: Diagnosis not present

## 2016-06-10 DIAGNOSIS — F17210 Nicotine dependence, cigarettes, uncomplicated: Secondary | ICD-10-CM | POA: Diagnosis not present

## 2016-06-10 DIAGNOSIS — Z86718 Personal history of other venous thrombosis and embolism: Secondary | ICD-10-CM | POA: Diagnosis not present

## 2016-06-10 DIAGNOSIS — S52025D Nondisplaced fracture of olecranon process without intraarticular extension of left ulna, subsequent encounter for closed fracture with routine healing: Secondary | ICD-10-CM | POA: Diagnosis not present

## 2016-06-10 DIAGNOSIS — N179 Acute kidney failure, unspecified: Secondary | ICD-10-CM | POA: Diagnosis not present

## 2016-06-10 DIAGNOSIS — Z9181 History of falling: Secondary | ICD-10-CM | POA: Diagnosis not present

## 2016-06-10 DIAGNOSIS — F329 Major depressive disorder, single episode, unspecified: Secondary | ICD-10-CM | POA: Diagnosis not present

## 2016-06-10 DIAGNOSIS — M797 Fibromyalgia: Secondary | ICD-10-CM | POA: Diagnosis not present

## 2016-06-10 DIAGNOSIS — D500 Iron deficiency anemia secondary to blood loss (chronic): Secondary | ICD-10-CM | POA: Diagnosis not present

## 2016-06-10 DIAGNOSIS — G8929 Other chronic pain: Secondary | ICD-10-CM | POA: Diagnosis not present

## 2016-06-15 DIAGNOSIS — R296 Repeated falls: Secondary | ICD-10-CM | POA: Diagnosis not present

## 2016-06-15 DIAGNOSIS — D500 Iron deficiency anemia secondary to blood loss (chronic): Secondary | ICD-10-CM | POA: Diagnosis not present

## 2016-06-15 DIAGNOSIS — G8929 Other chronic pain: Secondary | ICD-10-CM | POA: Diagnosis not present

## 2016-06-15 DIAGNOSIS — F17210 Nicotine dependence, cigarettes, uncomplicated: Secondary | ICD-10-CM | POA: Diagnosis not present

## 2016-06-15 DIAGNOSIS — N179 Acute kidney failure, unspecified: Secondary | ICD-10-CM | POA: Diagnosis not present

## 2016-06-15 DIAGNOSIS — S52025D Nondisplaced fracture of olecranon process without intraarticular extension of left ulna, subsequent encounter for closed fracture with routine healing: Secondary | ICD-10-CM | POA: Diagnosis not present

## 2016-06-15 DIAGNOSIS — I10 Essential (primary) hypertension: Secondary | ICD-10-CM | POA: Diagnosis not present

## 2016-06-15 DIAGNOSIS — F329 Major depressive disorder, single episode, unspecified: Secondary | ICD-10-CM | POA: Diagnosis not present

## 2016-06-15 DIAGNOSIS — Z9181 History of falling: Secondary | ICD-10-CM | POA: Diagnosis not present

## 2016-06-15 DIAGNOSIS — Z86718 Personal history of other venous thrombosis and embolism: Secondary | ICD-10-CM | POA: Diagnosis not present

## 2016-06-15 DIAGNOSIS — Z96651 Presence of right artificial knee joint: Secondary | ICD-10-CM | POA: Diagnosis not present

## 2016-06-15 DIAGNOSIS — M797 Fibromyalgia: Secondary | ICD-10-CM | POA: Diagnosis not present

## 2016-06-16 DIAGNOSIS — N179 Acute kidney failure, unspecified: Secondary | ICD-10-CM | POA: Diagnosis not present

## 2016-06-16 DIAGNOSIS — R350 Frequency of micturition: Secondary | ICD-10-CM | POA: Diagnosis not present

## 2016-06-16 DIAGNOSIS — M545 Low back pain: Secondary | ICD-10-CM | POA: Diagnosis not present

## 2016-06-17 DIAGNOSIS — M4120 Other idiopathic scoliosis, site unspecified: Secondary | ICD-10-CM | POA: Diagnosis not present

## 2016-06-17 DIAGNOSIS — N3001 Acute cystitis with hematuria: Secondary | ICD-10-CM | POA: Diagnosis not present

## 2016-06-17 DIAGNOSIS — R300 Dysuria: Secondary | ICD-10-CM | POA: Diagnosis not present

## 2016-06-17 DIAGNOSIS — L750 Bromhidrosis: Secondary | ICD-10-CM | POA: Diagnosis not present

## 2016-06-17 DIAGNOSIS — M2550 Pain in unspecified joint: Secondary | ICD-10-CM | POA: Diagnosis not present

## 2016-06-17 DIAGNOSIS — G8929 Other chronic pain: Secondary | ICD-10-CM | POA: Diagnosis not present

## 2016-06-17 DIAGNOSIS — Z79899 Other long term (current) drug therapy: Secondary | ICD-10-CM | POA: Diagnosis not present

## 2016-06-17 DIAGNOSIS — R8290 Unspecified abnormal findings in urine: Secondary | ICD-10-CM | POA: Diagnosis not present

## 2016-06-17 DIAGNOSIS — L299 Pruritus, unspecified: Secondary | ICD-10-CM | POA: Diagnosis not present

## 2016-06-17 DIAGNOSIS — M961 Postlaminectomy syndrome, not elsewhere classified: Secondary | ICD-10-CM | POA: Diagnosis not present

## 2016-06-17 DIAGNOSIS — M25532 Pain in left wrist: Secondary | ICD-10-CM | POA: Diagnosis not present

## 2016-06-22 DIAGNOSIS — Z96651 Presence of right artificial knee joint: Secondary | ICD-10-CM | POA: Diagnosis not present

## 2016-06-22 DIAGNOSIS — G8929 Other chronic pain: Secondary | ICD-10-CM | POA: Diagnosis not present

## 2016-06-22 DIAGNOSIS — M797 Fibromyalgia: Secondary | ICD-10-CM | POA: Diagnosis not present

## 2016-06-22 DIAGNOSIS — Z86718 Personal history of other venous thrombosis and embolism: Secondary | ICD-10-CM | POA: Diagnosis not present

## 2016-06-22 DIAGNOSIS — S52025D Nondisplaced fracture of olecranon process without intraarticular extension of left ulna, subsequent encounter for closed fracture with routine healing: Secondary | ICD-10-CM | POA: Diagnosis not present

## 2016-06-22 DIAGNOSIS — D500 Iron deficiency anemia secondary to blood loss (chronic): Secondary | ICD-10-CM | POA: Diagnosis not present

## 2016-06-22 DIAGNOSIS — F17210 Nicotine dependence, cigarettes, uncomplicated: Secondary | ICD-10-CM | POA: Diagnosis not present

## 2016-06-22 DIAGNOSIS — R296 Repeated falls: Secondary | ICD-10-CM | POA: Diagnosis not present

## 2016-06-22 DIAGNOSIS — F329 Major depressive disorder, single episode, unspecified: Secondary | ICD-10-CM | POA: Diagnosis not present

## 2016-06-22 DIAGNOSIS — N179 Acute kidney failure, unspecified: Secondary | ICD-10-CM | POA: Diagnosis not present

## 2016-06-22 DIAGNOSIS — Z9181 History of falling: Secondary | ICD-10-CM | POA: Diagnosis not present

## 2016-06-22 DIAGNOSIS — I10 Essential (primary) hypertension: Secondary | ICD-10-CM | POA: Diagnosis not present

## 2016-06-29 DIAGNOSIS — N179 Acute kidney failure, unspecified: Secondary | ICD-10-CM | POA: Diagnosis not present

## 2016-06-29 DIAGNOSIS — Z96651 Presence of right artificial knee joint: Secondary | ICD-10-CM | POA: Diagnosis not present

## 2016-06-29 DIAGNOSIS — I10 Essential (primary) hypertension: Secondary | ICD-10-CM | POA: Diagnosis not present

## 2016-06-29 DIAGNOSIS — R296 Repeated falls: Secondary | ICD-10-CM | POA: Diagnosis not present

## 2016-06-29 DIAGNOSIS — Z9181 History of falling: Secondary | ICD-10-CM | POA: Diagnosis not present

## 2016-06-29 DIAGNOSIS — D500 Iron deficiency anemia secondary to blood loss (chronic): Secondary | ICD-10-CM | POA: Diagnosis not present

## 2016-06-29 DIAGNOSIS — F329 Major depressive disorder, single episode, unspecified: Secondary | ICD-10-CM | POA: Diagnosis not present

## 2016-06-29 DIAGNOSIS — Z86718 Personal history of other venous thrombosis and embolism: Secondary | ICD-10-CM | POA: Diagnosis not present

## 2016-06-29 DIAGNOSIS — F17210 Nicotine dependence, cigarettes, uncomplicated: Secondary | ICD-10-CM | POA: Diagnosis not present

## 2016-06-29 DIAGNOSIS — G8929 Other chronic pain: Secondary | ICD-10-CM | POA: Diagnosis not present

## 2016-06-29 DIAGNOSIS — M797 Fibromyalgia: Secondary | ICD-10-CM | POA: Diagnosis not present

## 2016-06-29 DIAGNOSIS — S52025D Nondisplaced fracture of olecranon process without intraarticular extension of left ulna, subsequent encounter for closed fracture with routine healing: Secondary | ICD-10-CM | POA: Diagnosis not present

## 2016-06-30 DIAGNOSIS — S52032D Displaced fracture of olecranon process with intraarticular extension of left ulna, subsequent encounter for closed fracture with routine healing: Secondary | ICD-10-CM | POA: Diagnosis not present

## 2016-07-01 DIAGNOSIS — M1612 Unilateral primary osteoarthritis, left hip: Secondary | ICD-10-CM | POA: Diagnosis not present

## 2016-07-05 DIAGNOSIS — M961 Postlaminectomy syndrome, not elsewhere classified: Secondary | ICD-10-CM | POA: Diagnosis not present

## 2016-07-05 DIAGNOSIS — I872 Venous insufficiency (chronic) (peripheral): Secondary | ICD-10-CM | POA: Diagnosis not present

## 2016-07-05 DIAGNOSIS — R609 Edema, unspecified: Secondary | ICD-10-CM | POA: Diagnosis not present

## 2016-07-05 DIAGNOSIS — Z79899 Other long term (current) drug therapy: Secondary | ICD-10-CM | POA: Diagnosis not present

## 2016-07-06 ENCOUNTER — Telehealth: Payer: Self-pay | Admitting: Physician Assistant

## 2016-07-08 NOTE — Telephone Encounter (Signed)
close encounter °

## 2016-07-09 ENCOUNTER — Ambulatory Visit: Payer: PPO | Admitting: Physician Assistant

## 2016-07-14 ENCOUNTER — Encounter: Payer: Self-pay | Admitting: Cardiology

## 2016-07-14 ENCOUNTER — Ambulatory Visit (INDEPENDENT_AMBULATORY_CARE_PROVIDER_SITE_OTHER): Payer: PPO | Admitting: Cardiology

## 2016-07-14 VITALS — BP 124/80 | HR 70 | Ht 64.0 in | Wt 162.0 lb

## 2016-07-14 DIAGNOSIS — I471 Supraventricular tachycardia: Secondary | ICD-10-CM

## 2016-07-14 DIAGNOSIS — I83813 Varicose veins of bilateral lower extremities with pain: Secondary | ICD-10-CM | POA: Diagnosis not present

## 2016-07-14 DIAGNOSIS — Z7189 Other specified counseling: Secondary | ICD-10-CM | POA: Diagnosis not present

## 2016-07-14 DIAGNOSIS — Z01810 Encounter for preprocedural cardiovascular examination: Secondary | ICD-10-CM

## 2016-07-14 DIAGNOSIS — I358 Other nonrheumatic aortic valve disorders: Secondary | ICD-10-CM | POA: Diagnosis not present

## 2016-07-14 DIAGNOSIS — I10 Essential (primary) hypertension: Secondary | ICD-10-CM | POA: Diagnosis not present

## 2016-07-14 NOTE — Progress Notes (Signed)
PCP: Ardith Dark, PA-C Orthopedic Sgx: Arm - Dr. Jerilynn Mages. Handy; Hip - Dr. Maureen Ralphs  Clinic Note: Chief Complaint  Patient presents with  . Follow-up    surgical clearance; no chest pain, has some edema, no pain or cramping in legs, no lightheaded ir dizziness    HPI: Shirley [name] is a 76 y.o. female with a PMH below who presents today for Annual follow-up and preop clearance. Shirley [name] is a 76 y.o. female who is being seen today for preoperative evaluation for Hip Replacement at the request of [place], [place], [place] for Dr. Maureen Ralphs (Orthopedic Sgx) She has a  Long-standing history of atrial arrhythmias suggestive of PAT. She also has aortic sclerosis but no stenosis. She does have varicose veins and venous stasis but no significant reflux. Not amenable for tortuosity.  Shirley [name] was last seen on 06/03/2015. She decided not to wear a monitor that was recommended by Richardson Dopp, PA after concern for possible arrhythmia and palpitations. Her metoprolol was increased to 25 mg twice a day.  Recent Hospitalizations: Orthopedic surgery 04/15/2016. - Fall with L arm fracture - ? UTI in January 2018 - she had acute renal insufficiency; ORIF of L arm - 2 wks later - fall with re-fracture 04/15/2016  Studies Personally Reviewed - (if available, images/films reviewed: From Epic Chart or Care Everywhere)  2-D Echo January 2018 (for ? TIA/CVA) : EF 65-70%. Normal wall motion. GR 1 DD. Severe LA dilation. Mild to moderate TR with mild to moderately elevated PA pressures (44 mmHg) mild aortic calcification but no stenosis. No source of emboli   Interval History:  Shirley [name] returns today for follow-up. She is relatively asymptomatic overall from a cardiac standpoint. She has been limited as far as her walking as of her significant hip pain.   Thankfully, it would appear that her palpitations are notably improved on beta blocker and she has not noticed any significant arrhythmias. The only time she  noticed anything suggestive of tachycardia palpitations was doing a panic attack shortly after she got home from the hospital in January. She was having some confusion from her  Recent hospitalizationand got very scared.   Other than that she's not had any syncope or near-syncope symptoms.   No TIA or amaurosis fugax symptoms. She has had falls which are mostly related to loss of balance. To the extent that she is able to walk, exertion. She has no resting dyspnea. Edema is ped with torsemide. Not associated with PND orthopnea..  No claudication.  ROS: A comprehensive was performed. Review of Systems  Constitutional: Positive for weight loss (Partially unintentional due to her hospitalizations.). Negative for malaise/fatigue (Still has lack of get up and go. But seems to be improving from her recent hospitalizations. Finally started to recover.).  HENT: Positive for congestion.   Respiratory: Positive for cough.   Cardiovascular: Positive for leg swelling.  Gastrointestinal: Negative for blood in stool, constipation and melena.  Genitourinary: Negative for hematuria.  Musculoskeletal: Positive for back pain and joint pain.  Skin: Negative.   Neurological: Negative for dizziness (Just has balance issues.) and weakness.  Endo/Heme/Allergies: Positive for environmental allergies.  Psychiatric/Behavioral: Positive for memory loss. Negative for depression. The patient is nervous/anxious (Shortly after her January hospitalization, she had a panic attack that was very scary.). The patient does not have insomnia.   All other systems reviewed and are negative.  I have reviewed and (if needed) personally updated the patient's problem list, medications, allergies, past medical  and surgical history, social and family history. -- reviewed several hospitalizations, operative reports, & cardiac studies.  Past Medical History:  Diagnosis Date  . Allergy    vasomotor rhinitis  . Anemia   . Asthma    h/o  asthma and bronchitis  . BV (bacterial vaginosis)    history of frequent  . Cervical spondylosis    herniated disk protruding @ C6-7, impinging cord and left axillary root sleeve.  . Chronic pain   . Closed olecranon fracture, left, initial encounter   . Depression   . DVT of upper extremity (deep vein thrombosis) ([facility])    h/o left(after wrist fracture/surgery); no residual thrombus or phlebitis by Dopplers July 2014  . Dyspnea    with exertion  . Edema   . Fibromyalgia    sees Dr.Phillips-pain management  . GERD (gastroesophageal reflux disease)    h/o  . Headache    marigraine- hormal - none in years  . Hypertension    never has been treated that daughter is aware  . Neuropathy   . Neuropathy   . Osteopenia    mild at hip (T-1.3)  . PAT (paroxysmal atrial tachycardia) ([facility])   . Pneumonia    years ago  . Recurrent falls 04/16/2016  . Varicose veins   . 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 Eilleen Kempf @ [facility]  . Vision problem    wears glasses  . Vitamin D deficiency     Past Surgical History:  Procedure Laterality Date  . ABDOMINAL HYSTERECTOMY  1999   BSO for endometriosis  . APPENDECTOMY    . BACK SURGERY  age 27   ruptured disk L3-4   . CATARACT EXTRACTION  2000   B/L  . COLONOSCOPY    . FINGER SURGERY     left finger for tender rupture from fall.  Marland Kitchen FOOT SURGERY     multiple(at least 4 on right, 5 on left)-left Dr.Kerner([place])11/08,left Dr.Nunley-excision 2nd and 3rd mt heads w/artho and hammertoe surgery 4th and 5th 04/2006. left great toe IP fusion and revision of fusion of 2nd through 5th toes 12/2005. Right foot surgeries  Dr.Bednarz 04/2008 and 04/2009.  Marland Kitchen KNEE SURGERY Right    right knee replacement  . NECK SURGERY  2007   cervical  . ORIF  ELBOW FRACTURE Left 03/22/2016   Procedure: OPEN REDUCTION INTERNAL FIXATION (ORIF) ELBOW/OLECRANON FRACTURE;  Surgeon: Altamese Grandfield, MD;  Location: [facility];  Service: Orthopedics;  Laterality: Left;  . ORIF ELBOW FRACTURE Left 04/15/2016   Procedure: OPEN REDUCTION INTERNAL FIXATION (ORIF) LEFT ELBOW/OLECRANON FRACTURE;  Surgeon: Altamese [place], MD;  Location: [facility];  Service: Orthopedics;  Laterality: Left;  . RADIOLOGY WITH ANESTHESIA Left 01/06/2016   Procedure: MRI LEFT HIP WITH AND WITHOUT;  Surgeon: Medication Radiologist, MD;  Location: [facility];  Service: Radiology;  Laterality: Left;  . REFRACTIVE SURGERY  2007   left eye  . SHOULDER SURGERY     x8 (4 on rt side and 4 on left side)  . SPINAL FUSION  6/09   Dr.Cohen  . STERIOD INJECTION Left 04/15/2016   Procedure: LEFT HIP STEROID INJECTION;  Surgeon: Altamese [place], MD;  Location: [facility];  Service: Orthopedics;  Laterality: Left;  . TONSILLECTOMY AND ADENOIDECTOMY    . TRANSTHORACIC ECHOCARDIOGRAM  July 2012   Normal EF 60-65% , mild concentric LVH. Grade 2 diastolic dysfunction with elevated EDP. Massive left atrial dilation. Pulmonary pressures 30-40 mm Hg; aortic sclerosis but no stenosis. Moderate TR  . TRANSTHORACIC ECHOCARDIOGRAM  03/2016   EF 65-70%. Normal wall motion. GR 1 DD. Severe LA dilation. Mild to moderate TR with mild to moderately elevated PA pressures (44 mmHg) mild aortic calcification but no stenosis. No source of emboli   . WRIST SURGERY Left    2 surgeries left wrist after fracture(complicated by DVT)    Current Meds  Medication Sig  . acetaminophen (TYLENOL) 325 MG tablet Take 2 tablets (650 mg total) by mouth every 4 (four) hours as needed for mild pain (or temp > 37.5 C (99.5 F)).  Marland Kitchen ALPRAZolam (XANAX) 1 MG tablet Take 0.5-1 mg by mouth 3 (three) times daily as needed for anxiety or sleep.   . cetirizine (ZYRTEC) 10 MG tablet Take 10 mg by mouth daily as needed for allergies.   . Cholecalciferol (VITAMIN D3)  5000 UNITS CAPS Take 5,000 Units by mouth 2 (two) times daily.   . fluticasone (FLONASE) 50 MCG/ACT nasal spray Place 2 sprays into both nostrils daily as needed for allergies or rhinitis.  Marland Kitchen gabapentin (NEURONTIN) 300 MG capsule Take 300-900 mg by mouth 3 (three) times daily. Take 300mg  in the am and afternoon, and then take 900mg  at night  . metoprolol tartrate (LOPRESSOR) 25 MG tablet Take 1 tablet (25 mg total) by mouth 2 (two) times daily.  Marland Kitchen oxycodone (ROXICODONE) 30 MG immediate release tablet Take 1 tablet (30 mg total) by mouth every 4 (four) hours as needed for pain (for pain). Reported on 06/03/2015  . sertraline (ZOLOFT) 100 MG tablet Take 100 mg by mouth at bedtime.  . torsemide (DEMADEX) 10 MG tablet Take 20 mg by mouth daily.     Allergies  Allergen Reactions  . No Known Allergies     Social History   Social History  . Marital status: Married    Spouse name: Shirley [name]  . Number of children: N/A  . Years of education: N/A   Social History Main Topics  . Smoking status: Current Some Day Smoker  . Smokeless tobacco: Never Used     Comment: doesnt smoke every day  . Alcohol use No  . Drug use: No  . Sexual activity: Not Asked   Other Topics Concern  . None   Social History Narrative   Lives with husband.  3 children ([place], [place], [place]), 5 grandchildren    family history includes Breast cancer in her paternal aunt; Diabetes in her brother, brother, and brother; Heart attack (age of onset: 17) in her brother; Heart attack (age of onset: 95) in her brother; Heart disease in her father; Heart failure in her father, maternal grandmother, and paternal grandfather; Lung cancer in her father; Osteoarthritis in her mother; Osteoporosis in her sister; Stroke in her mother.  Wt Readings from Last 3 Encounters:  07/14/16 162 lb (73.5 kg)  03/24/16 179 lb 9.6 oz (81.5 kg)  01/06/16 180 lb (81.6 kg)    PHYSICAL EXAM BP 124/80   Pulse 70   Ht 5\' 4"  (1.626 m)   Wt 162  lb (73.5 kg)   BMI 27.81 kg/m  General appearance: Pleasant elderly woman. Relatively healthy-appearing.  Alert, cooperative, appears stated age, no distress.  HEENT: [place]/AT, EOMI, MMM, anicteric sclera Neck: no adenopathy, no carotid bruit and no JVD Lungs: clear to auscultation bilaterally,  normal percussion bilaterally and non-labored Heart: regular rate and rhythm, S1 &S2 normal, 2/6 SEM @ RUSB- carotids (? Soft HSM @ apex). No click, rub or gallop; non-displaced PMI Abdomen: soft, non-tender; bowel sounds normal; no masses,  no organomegaly; Extremities: extremities normal, atraumatic, no cyanosis, and edema - trace Pulses: 2+ and symmetric;  Skin: mobility and turgor normal, no evidence of bleeding or bruising and no lesions noted  Neurologic: Mental status: Alert & oriented x 3, thought content appropriate; non-focal exam.  Pleasant mood & affect. Cranial nerves: normal (II-XII grossly intact)   Adult ECG Report  Rate: 71 ;  Rhythm: normal sinus rhythm, premature atrial contractions (PAC) and Otherwise normal axis, intervals and durations.;   Narrative Interpretation:  Relatively stable EKG   Other studies Reviewed: Additional studies/ records that were reviewed today include:  Recent Labs:   Lab Results  Component Value Date   CREATININE 1.06 (H) 04/17/2016   BUN 22 (H) 04/17/2016   NA 139 04/17/2016   K 4.0 04/17/2016   CL 105 04/17/2016   CO2 27 04/17/2016   Lab Results  Component Value Date   CHOL 152 03/18/2016   HDL 55 03/18/2016   LDLCALC 84 03/18/2016   TRIG 64 03/18/2016   CHOLHDL 2.8 03/18/2016    ASSESSMENT / PLAN: Problem List Items Addressed This Visit    Aortic systolic murmur on examination (Chronic)     Aortic sclerosis noted but no stenosis on recent echo. Would not recheck an echocardiogram unless her murmur is louder on exam, or symptoms warrant.      DNR (do not resuscitate) discussion (Chronic)     She actually has an out-of-hospital DO NOT  RESUSCITATE form with area today.      Exertional dyspnea   Hypertension (Chronic)     Very well controlled on low-dose beta blocker.      Relevant Orders   EKG 12-Lead   PAT (paroxysmal atrial tachycardia) / MAT (Chronic)     She never did wear the monitor. This seems like symptoms of an well-controlledat this point I would to simply continue her beta blocker for management as is his be working well. This will help Reduce perioperative risk.      Relevant Orders   EKG 12-Lead   Pre-operative cardiovascular examination - Primary     Shirley [name] is a frail elderly woman without any significant  Cardiac history -  No angina or heart failure. The edema is not related to heart failure -  Related to venous insufficiency.. She does not have a history of stroke.  Renal function is stable, and she does not have diabetes.    PREOPERATIVE CARDIAC RISK ASSESSMENT   Revised Cardiac Risk Index:  High Risk Surgery: no;   Defined as Intraperitoneal, intrathoracic or suprainguinal vascular  Active CAD: no;   CHF: no;   Cerebrovascular Disease: no;   Diabetes: yes; On Insulin: no  CKD (Cr >~ 2): no;  Total: 0 Estimated Risk of Adverse Outcome: LOW RISK  Estimated Risk of MI, PE, VF/VT (Cardiac Arrest), Complete Heart Block: <1 %  Based on the ACC/AHA guidelines for preoperative evaluation, for a  LOW RISK (from a cardiac sandpoint) elective surgery in a patient without active cardiac symptoms,  The recommendation would be to proceed to the OR without other cardiac evaluation. She actually just had an echocardiogram to evaluate her function and valve disease. No evidence of any significant findings. I would not do a stress test on her because she  is not actively asymptomatic & TESTING WOULD NOT CHANGE MANAGEMENT.  We will be available perioperatively if needed.       Relevant Orders   EKG 12-Lead   Varicose veins of both lower extremities with pain (Chronic)     She continues to decline  any referral for invasive treatment. She does wear compression stockings every so often. She does have a nurse Caregiver that is with her here today,  Who will help her with the stockings on.   She continues to take torsemide  Most days out of the week, but not everyday.Swelling is actually better than usual.         Current medicines are reviewed at length with the patient today. (+/- concerns) n/a The following changes have been made: n/a  Patient Instructions  No changes with current medication or treatment      Continue taking metoprolol tartrate during   Pre and post left hip surgery CARDIAC CLEARANCE FOR LEFT HIP SURGERY WITH DR Wynelle Link.     Your physician wants you to follow-up in 12 months with DR Satine Hausner. You will receive a reminder letter in the mail two months in advance. If you don't receive a letter, please call our office to schedule the follow-up appointment.     If you need a refill on your cardiac medications before your next appointment, please call your pharmacy.    Studies Ordered:   Orders Placed This Encounter  Procedures  . EKG 12-Lead      Glenetta Hew, M.D., M.S. Interventional Cardiologist   Pager # [phone] Phone # [phone] [address] [place], [place] [address]

## 2016-07-14 NOTE — Patient Instructions (Addendum)
No changes with current medication or treatment      Continue taking metoprolol tartrate during   Pre and post left hip surgery CARDIAC CLEARANCE FOR LEFT HIP SURGERY WITH DR Wynelle Link.     Your physician wants you to follow-up in 12 months with DR HARDING. You will receive a reminder letter in the mail two months in advance. If you don't receive a letter, please call our office to schedule the follow-up appointment.     If you need a refill on your cardiac medications before your next appointment, please call your pharmacy.

## 2016-07-15 DIAGNOSIS — M1612 Unilateral primary osteoarthritis, left hip: Secondary | ICD-10-CM | POA: Diagnosis not present

## 2016-07-16 ENCOUNTER — Encounter: Payer: Self-pay | Admitting: Cardiology

## 2016-07-16 DIAGNOSIS — Z01810 Encounter for preprocedural cardiovascular examination: Secondary | ICD-10-CM | POA: Insufficient documentation

## 2016-07-16 NOTE — Assessment & Plan Note (Signed)
Very well controlled on low-dose beta blocker.

## 2016-07-16 NOTE — Assessment & Plan Note (Signed)
She actually has an out-of-hospital DO NOT RESUSCITATE form with area today.

## 2016-07-16 NOTE — Assessment & Plan Note (Signed)
She never did wear the monitor. This seems like symptoms of an well-controlledat this point I would to simply continue her beta blocker for management as is his be working well. This will help Reduce perioperative risk.

## 2016-07-16 NOTE — Assessment & Plan Note (Signed)
Aortic sclerosis noted but no stenosis on recent echo. Would not recheck an echocardiogram unless her murmur is louder on exam, or symptoms warrant.

## 2016-07-16 NOTE — Assessment & Plan Note (Signed)
Magdalyn is a frail elderly woman without any significant  Cardiac history -  No angina or heart failure. The edema is not related to heart failure -  Related to venous insufficiency.. She does not have a history of stroke.  Renal function is stable, and she does not have diabetes.    PREOPERATIVE CARDIAC RISK ASSESSMENT   Revised Cardiac Risk Index:  High Risk Surgery: no;   Defined as Intraperitoneal, intrathoracic or suprainguinal vascular  Active CAD: no;   CHF: no;   Cerebrovascular Disease: no;   Diabetes: yes; On Insulin: no  CKD (Cr >~ 2): no;  Total: 0 Estimated Risk of Adverse Outcome: LOW RISK  Estimated Risk of MI, PE, VF/VT (Cardiac Arrest), Complete Heart Block: <1 %  Based on the ACC/AHA guidelines for preoperative evaluation, for a  LOW RISK (from a cardiac sandpoint) elective surgery in a patient without active cardiac symptoms,  The recommendation would be to proceed to the OR without other cardiac evaluation. She actually just had an echocardiogram to evaluate her function and valve disease. No evidence of any significant findings. I would not do a stress test on her because she is not actively asymptomatic & TESTING WOULD NOT CHANGE MANAGEMENT.  We will be available perioperatively if needed.

## 2016-07-16 NOTE — Assessment & Plan Note (Signed)
She continues to decline any referral for invasive treatment. She does wear compression stockings every so often. She does have a nurse Caregiver that is with her here today,  Who will help her with the stockings on.   She continues to take torsemide  Most days out of the week, but not everyday.Swelling is actually better than usual.

## 2016-07-30 NOTE — Addendum Note (Signed)
Addendum  created 07/30/16 3007 by Lyn Hollingshead, MD   Sign clinical note

## 2016-08-03 ENCOUNTER — Ambulatory Visit: Payer: Self-pay | Admitting: Orthopedic Surgery

## 2016-08-11 ENCOUNTER — Other Ambulatory Visit (HOSPITAL_COMMUNITY): Payer: Self-pay | Admitting: Emergency Medicine

## 2016-08-11 NOTE — Patient Instructions (Signed)
Shirley [name]  08/11/2016   Your procedure is scheduled on: 08-18-16  Report to [facility] Main  Entrance    Report to admitting at 11:45AM   Call this number if you have problems the morning of surgery [phone]   Remember: ONLY 1 PERSON MAY GO WITH YOU TO SHORT STAY TO GET  READY MORNING OF YOUR SURGERY.  Do not eat food After Midnight. You may have clear liquids from midnight until 815am day of surgery. Nothing by mouth after 815am!     Take these medicines the morning of surgery with A SIP OF WATER: gabapentin, metoprolol, oxycodone as needed, nasal spray as needed, xanax as needed                                You may not have any metal on your body including hair pins and              piercings  Do not wear jewelry, make-up, lotions, powders or perfumes, deodorant             Do not wear nail polish.  Do not shave  48 hours prior to surgery.            Do not bring valuables to the hospital. [place].  Contacts, dentures or bridgework may not be worn into surgery.  Leave suitcase in the car. After surgery it may be brought to your room.                Please read over the following fact sheets you were given: _____________________________________________________________________         CLEAR LIQUID DIET   Foods Allowed                                                                     Foods Excluded  Coffee and tea, regular and decaf                             liquids that you cannot  Plain Jell-O in any flavor                                             see through such as: Fruit ices (not with fruit pulp)                                     milk, soups, orange juice  Iced Popsicles                                    All solid food Carbonated beverages, regular and diet  Cranberry, grape and apple juices Sports drinks like Gatorade Lightly seasoned  clear broth or consume(fat free) Sugar, honey syrup  Sample Menu Breakfast                                Lunch                                     Supper Cranberry juice                    Beef broth                            Chicken broth Jell-O                                     Grape juice                           Apple juice Coffee or tea                        Jell-O                                      Popsicle                                                Coffee or tea                        Coffee or tea  _____________________________________________________________________  [facility] - Preparing for Surgery Before surgery, you can play an important role.  Because skin is not sterile, your skin needs to be as free of germs as possible.  You can reduce the number of germs on your skin by washing with CHG (chlorahexidine gluconate) soap before surgery.  CHG is an antiseptic cleaner which kills germs and bonds with the skin to continue killing germs even after washing. Please DO NOT use if you have an allergy to CHG or antibacterial soaps.  If your skin becomes reddened/irritated stop using the CHG and inform your nurse when you arrive at Short Stay. Do not shave (including legs and underarms) for at least 48 hours prior to the first CHG shower.  You may shave your face/neck. Please follow these instructions carefully:  1.  Shower with CHG Soap the night before surgery and the  morning of Surgery.  2.  If you choose to wash your hair, wash your hair first as usual with your  normal  shampoo.  3.  After you shampoo, rinse your hair and body thoroughly to remove the  shampoo.                           4.  Use CHG as you would any other liquid soap.  You can apply chg directly  to the skin and wash  Gently with a scrungie or clean washcloth.  5.  Apply the CHG Soap to your body ONLY FROM THE NECK DOWN.   Do not use on face/ open                           Wound or  open sores. Avoid contact with eyes, ears mouth and genitals (private parts).                       Wash face,  Genitals (private parts) with your normal soap.             6.  Wash thoroughly, paying special attention to the area where your surgery  will be performed.  7.  Thoroughly rinse your body with warm water from the neck down.  8.  DO NOT shower/wash with your normal soap after using and rinsing off  the CHG Soap.                9.  Pat yourself dry with a clean towel.            10.  Wear clean pajamas.            11.  Place clean sheets on your bed the night of your first shower and do not  sleep with pets. Day of Surgery : Do not apply any lotions/deodorants the morning of surgery.  Please wear clean clothes to the hospital/surgery center.  FAILURE TO FOLLOW THESE INSTRUCTIONS MAY RESULT IN THE CANCELLATION OF YOUR SURGERY PATIENT SIGNATURE_________________________________  NURSE SIGNATURE__________________________________  ________________________________________________________________________   Adam Phenix  An incentive spirometer is a tool that can help keep your lungs clear and active. This tool measures how well you are filling your lungs with each breath. Taking long deep breaths may help reverse or decrease the chance of developing breathing (pulmonary) problems (especially infection) following:  A long period of time when you are unable to move or be active. BEFORE THE PROCEDURE   If the spirometer includes an indicator to show your best effort, your nurse or respiratory therapist will set it to a desired goal.  If possible, sit up straight or lean slightly forward. Try not to slouch.  Hold the incentive spirometer in an upright position. INSTRUCTIONS FOR USE  1. Sit on the edge of your bed if possible, or sit up as far as you can in bed or on a chair. 2. Hold the incentive spirometer in an upright position. 3. Breathe out normally. 4. Place the  mouthpiece in your mouth and seal your lips tightly around it. 5. Breathe in slowly and as deeply as possible, raising the piston or the ball toward the top of the column. 6. Hold your breath for 3-5 seconds or for as long as possible. Allow the piston or ball to fall to the bottom of the column. 7. Remove the mouthpiece from your mouth and breathe out normally. 8. Rest for a few seconds and repeat Steps 1 through 7 at least 10 times every 1-2 hours when you are awake. Take your time and take a few normal breaths between deep breaths. 9. The spirometer may include an indicator to show your best effort. Use the indicator as a goal to work toward during each repetition. 10. After each set of 10 deep breaths, practice coughing to be sure your lungs are clear. If you have an incision (the cut made at the time of  surgery), support your incision when coughing by placing a pillow or rolled up towels firmly against it. Once you are able to get out of bed, walk around indoors and cough well. You may stop using the incentive spirometer when instructed by your caregiver.  RISKS AND COMPLICATIONS  Take your time so you do not get dizzy or light-headed.  If you are in pain, you may need to take or ask for pain medication before doing incentive spirometry. It is harder to take a deep breath if you are having pain. AFTER USE  Rest and breathe slowly and easily.  It can be helpful to keep track of a log of your progress. Your caregiver can provide you with a simple table to help with this. If you are using the spirometer at home, follow these instructions: [place] IF:   You are having difficultly using the spirometer.  You have trouble using the spirometer as often as instructed.  Your pain medication is not giving enough relief while using the spirometer.  You develop fever of 100.5 F (38.1 C) or higher. SEEK IMMEDIATE MEDICAL CARE IF:   You cough up bloody sputum that had not been present  before.  You develop fever of 102 F (38.9 C) or greater.  You develop worsening pain at or near the incision site. MAKE SURE YOU:   Understand these instructions.  Will watch your condition.  Will get help right away if you are not doing well or get worse. Document Released: 06/21/2006 Document Revised: 05/03/2011 Document Reviewed: 08/22/2006 Exit[employer] Patient Information 2014 [employer], [place].   ________________________________________________________________________  WHAT IS A BLOOD TRANSFUSION? Blood Transfusion Information  A transfusion is the replacement of blood or some of its parts. Blood is made up of multiple cells which provide different functions.  Red blood cells carry oxygen and are used for blood loss replacement.  White blood cells fight against infection.  Platelets control bleeding.  Plasma helps clot blood.  Other blood products are available for specialized needs, such as hemophilia or other clotting disorders. BEFORE THE TRANSFUSION  Who gives blood for transfusions?   Healthy volunteers who are fully evaluated to make sure their blood is safe. This is blood bank blood. Transfusion therapy is the safest it has ever been in the practice of medicine. Before blood is taken from a donor, a complete history is taken to make sure that person has no history of diseases nor engages in risky social behavior (examples are intravenous drug use or sexual activity with multiple partners). The donor's travel history is screened to minimize risk of transmitting infections, such as malaria. The donated blood is tested for signs of infectious diseases, such as HIV and hepatitis. The blood is then tested to be sure it is compatible with you in order to minimize the chance of a transfusion reaction. If you or a relative donates blood, this is often done in anticipation of surgery and is not appropriate for emergency situations. It takes many days to process the donated  blood. RISKS AND COMPLICATIONS Although transfusion therapy is very safe and saves many lives, the main dangers of transfusion include:   Getting an infectious disease.  Developing a transfusion reaction. This is an allergic reaction to something in the blood you were given. Every precaution is taken to prevent this. The decision to have a blood transfusion has been considered carefully by your caregiver before blood is given. Blood is not given unless the benefits outweigh the risks. AFTER THE  TRANSFUSION  Right after receiving a blood transfusion, you will usually feel much better and more energetic. This is especially true if your red blood cells have gotten low (anemic). The transfusion raises the level of the red blood cells which carry oxygen, and this usually causes an energy increase.  The nurse administering the transfusion will monitor you carefully for complications. HOME CARE INSTRUCTIONS  No special instructions are needed after a transfusion. You may find your energy is better. Speak with your caregiver about any limitations on activity for underlying diseases you may have. SEEK MEDICAL CARE IF:   Your condition is not improving after your transfusion.  You develop redness or irritation at the intravenous (IV) site. SEEK IMMEDIATE MEDICAL CARE IF:  Any of the following symptoms occur over the next 12 hours:  Shaking chills.  You have a temperature by mouth above 102 F (38.9 C), not controlled by medicine.  Chest, back, or muscle pain.  People around you feel you are not acting correctly or are confused.  Shortness of breath or difficulty breathing.  Dizziness and fainting.  You get a rash or develop hives.  You have a decrease in urine output.  Your urine turns a dark color or changes to pink, red, or brown. Any of the following symptoms occur over the next 10 days:  You have a temperature by mouth above 102 F (38.9 C), not controlled by  medicine.  Shortness of breath.  Weakness after normal activity.  The white part of the eye turns yellow (jaundice).  You have a decrease in the amount of urine or are urinating less often.  Your urine turns a dark color or changes to pink, red, or brown. Document Released: 02/06/2000 Document Revised: 05/03/2011 Document Reviewed: 09/25/2007 [facility] Patient Information 2014 [place], [place].  _______________________________________________________________________

## 2016-08-11 NOTE — Progress Notes (Signed)
LOV/ cardiac clearance Ellyn Hack MD 07-14-16 epic EKG 07-14-16 epic ECHO 03-18-16 epic CXR 03-18-16 epic

## 2016-08-12 ENCOUNTER — Encounter (HOSPITAL_COMMUNITY): Payer: Self-pay

## 2016-08-12 ENCOUNTER — Encounter (HOSPITAL_COMMUNITY)
Admission: RE | Admit: 2016-08-12 | Discharge: 2016-08-12 | Disposition: A | Discharge status: Home / Self Care | Payer: PPO | Source: Ambulatory Visit | Attending: Orthopedic Surgery | Admitting: Orthopedic Surgery

## 2016-08-12 DIAGNOSIS — Z01812 Encounter for preprocedural laboratory examination: Secondary | ICD-10-CM | POA: Insufficient documentation

## 2016-08-12 LAB — COMPREHENSIVE METABOLIC PANEL
ALT: 12 U/L — AB (ref 14–54)
AST: 19 U/L (ref 15–41)
Albumin: 4.2 g/dL (ref 3.5–5.0)
Alkaline Phosphatase: 73 U/L (ref 38–126)
Anion gap: 7 (ref 5–15)
BILIRUBIN TOTAL: 0.3 mg/dL (ref 0.3–1.2)
BUN: 27 mg/dL — AB (ref 6–20)
CHLORIDE: 105 mmol/L (ref 101–111)
CO2: 26 mmol/L (ref 22–32)
CREATININE: 1.14 mg/dL — AB (ref 0.44–1.00)
Calcium: 9.3 mg/dL (ref 8.9–10.3)
GFR calc Af Amer: 52 mL/min — ABNORMAL LOW (ref >60)
GFR, EST NON AFRICAN AMERICAN: 45 mL/min — AB (ref >60)
Glucose, Bld: 105 mg/dL — ABNORMAL HIGH (ref 65–99)
POTASSIUM: 4.9 mmol/L (ref 3.5–5.1)
Sodium: 138 mmol/L (ref 135–145)
TOTAL PROTEIN: 7 g/dL (ref 6.5–8.1)

## 2016-08-12 LAB — CBC
HEMATOCRIT: 32.6 % — AB (ref 36.0–46.0)
HEMOGLOBIN: 10.1 g/dL — AB (ref 12.0–15.0)
MCH: 27.9 pg (ref 26.0–34.0)
MCHC: 31 g/dL (ref 30.0–36.0)
MCV: 90.1 fL (ref 78.0–100.0)
Platelets: 550 10*3/uL — ABNORMAL HIGH (ref 150–400)
RBC: 3.62 MIL/uL — AB (ref 3.87–5.11)
RDW: 13.7 % (ref 11.5–15.5)
WBC: 5.5 10*3/uL (ref 4.0–10.5)

## 2016-08-12 LAB — PROTIME-INR
INR: 1.03
PROTHROMBIN TIME: 13.5 s (ref 11.4–15.2)

## 2016-08-12 LAB — SURGICAL PCR SCREEN
MRSA, PCR: NEGATIVE
STAPHYLOCOCCUS AUREUS: POSITIVE — AB

## 2016-08-12 LAB — ABO/RH: ABO/RH(D): A NEG

## 2016-08-12 LAB — APTT: APTT: 32 s (ref 24–36)

## 2016-08-12 NOTE — Progress Notes (Signed)
CBC and CMP routed via epic to [place] MD

## 2016-08-17 ENCOUNTER — Ambulatory Visit: Payer: Self-pay | Admitting: Orthopedic Surgery

## 2016-08-17 NOTE — H&P (Signed)
Shirley [name] DOB: 1940/10/14 Married / Language: English / Race: White Female Date of Admission:  08/18/2016 CC:  Left Hip Pain History of Present Illness The patient is a 76 year old female who comes in for a preoperative History and Physical. The patient is scheduled for a left total hip arthroplasty (anterior) to be performed by Dr. Dione Plover. Aluisio, MD at [facility] on 08/18/2016. The patient is a 76 year old female who presented for follow up of their hip. The patient is being followed for their left hip pain and osteoarthritis. They are year(s) out from when symptoms began. Symptoms reported include: pain. The patient feels that they are doing poorly and report their pain level to be moderate to severe. The following medication has been used for pain control: Oxycodone. The patient has not gotten any relief of their symptoms with Cortisone injections (Left hip IA injection w/ Dr. Marcelino Scot mid March: didn't help). She is having progressive worsening pain and dysfunction in that left hip. It is hurting at all times. It is limiting what she can and cannot do. Review of her radiographs show severe bone on bone arthritis with some erosion of the acetabulum. It is mainly superior erosion. She has got advance end stage arthritis of that hip, which is rapidly progressive in nature. At this point, the most predictable means of improving pain and function is going to be a total hip arthroplasty. They have been treated conservatively in the past for the above stated problem and despite conservative measures, they continue to have progressive pain and severe functional limitations and dysfunction. They have failed non-operative management including home exercise, medications, and injections. It is felt that they would benefit from undergoing total joint replacement. Risks and benefits of the procedure have been discussed with the patient and they elect to proceed with surgery. There are no active  contraindications to surgery such as ongoing infection or rapidly progressive neurological disease.   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 father Congestive Heart Failure  First Degree Relatives. father Rheumatoid Arthritis  brother Osteoarthritis  mother, grandmother mothers side, grandfather mothers side and grandmother fathers side Depression  sister Hypertension  father Osteoporosis  mother and sister  Social History  Children  3 Tobacco / smoke exposure  no Drug/Alcohol Rehab (Previously)  no Drug/Alcohol Rehab (Currently)  no Tobacco use  Current some day smoker. current some days smoker Living situation  live with spouse Pain Contract  yes Illicit drug use  yes Exercise  Exercises weekly; does running / walking Current work status  retired Alcohol use  never consumed alcohol Number of flights of stairs before winded  greater than 5 Marital status  married  Medication History  Vitamin D (50000U Tablet, 1 (one) Oral) Active. Xanax (1MG  Tablet, Oral) Active. OxyCODONE HCl (30MG  Tablet, Oral QID PRN) Active. Gabapentin (300MG  Capsule, Oral) Active. (1 AM, 1 PM, 3 QHS) Torsemide (10MG  Tablet, Oral) Active. Ibuprofen (200MG  Tablet, Oral) Active.  Past Surgical History Tubal Ligation  Spinal Fusion  lower back Spinal Surgery  Hysterectomy  complete (non-cancerous) Leg Circulation  Surgery  right Other Orthopaedic Surgery  Appendectomy  Tonsillectomy  Cataract Surgery  left Foot Surgery  bilateral    Review of Systems  General Not Present- Chills, Fatigue, Fever, Memory Loss, Night Sweats, Weight Gain and Weight Loss. Skin Not Present- Eczema, Hives, Itching, Lesions and Rash. HEENT Not Present- Dentures, Double Vision, Headache, Hearing Loss, Tinnitus and Visual Loss. Respiratory Not Present- Allergies, Chronic Cough, Coughing up blood, Shortness of breath at rest and Shortness of breath with exertion. Cardiovascular Not Present- Chest Pain, Difficulty Breathing Lying Down, Murmur, Palpitations, Racing/skipping heartbeats and Swelling. Gastrointestinal Not Present- Abdominal Pain, Bloody Stool, Constipation, Diarrhea, Difficulty Swallowing, Heartburn, Jaundice, Loss of appetitie, Nausea and Vomiting. Female Genitourinary Not Present- Blood in Urine, Discharge, Flank Pain, Incontinence, Painful Urination, Urgency, Urinary frequency, Urinary Retention, Urinating at Night and Weak urinary stream. Musculoskeletal Present- Joint Pain. Not Present- Back Pain, Joint Swelling, Morning Stiffness, Muscle Pain, Muscle Weakness and Spasms. Neurological Not Present- Blackout spells, Difficulty with balance, Dizziness, Paralysis, Tremor and Weakness. Psychiatric Not Present- Insomnia.  Vitals  Weight: 162 lb Height: 64in Weight was reported by patient. Height was reported by patient. Body Surface Area: 1.79 m Body Mass Index: 27.81 kg/m  Pulse: 56 (Regular)  BP: 134/76 (Sitting, Right Arm, Standard)       Physical Exam  General Mental Status -Alert, cooperative and good historian. General Appearance-pleasant, Not in acute distress. Orientation-Oriented X3. Build & Nutrition-Well nourished and Well developed.  Head and Neck Head-normocephalic, atraumatic . Neck Global Assessment - supple, no bruit auscultated on the right, no bruit  auscultated on the left.  Eye Vision-Wears contact lenses. Pupil - Bilateral-Regular and Round. Motion - Bilateral-EOMI.  Chest and Lung Exam Auscultation Breath sounds - clear at anterior chest wall and clear at posterior chest wall. Adventitious sounds - No Adventitious sounds.  Cardiovascular Auscultation Rhythm - Regular rate and rhythm. Heart Sounds - S1 WNL and S2 WNL. Murmurs & Other Heart Sounds - Auscultation of the heart reveals - No Murmurs.  Abdomen Palpation/Percussion Tenderness - Abdomen is non-tender to palpation. Rigidity (guarding) - Abdomen is soft. Auscultation Auscultation of the abdomen reveals - Bowel sounds normal.  Female Genitourinary Note: Not done, not pertinent to present illness   Musculoskeletal Note: She is in no distress. Evaluation of her left hip flexion is about 90, no internal rotation, about 10 to 20 of external rotation, 20 of abduction.  RADIOGRAPH Review of her radiographs show severe bone on bone arthritis with some erosion of the acetabulum. It is mainly superior erosion.   Assessment & Plan  Primary osteoarthritis of left hip (M16.12)  Note:Surgical Plans: Left Total Hip Replacement - Anterior Approach  Disposition: Home with assistance  Cards: Dr. Ellyn Hack - Patient has been seen preoperatively and felt to be stable for surgery.  IV TXA  Anesthesia Issues: None  Patient was instructed on what medications to stop prior to surgery.  Signed electronically by Joelene Millin, III PA-

## 2016-08-18 ENCOUNTER — Inpatient Hospital Stay (HOSPITAL_COMMUNITY): Payer: PPO

## 2016-08-18 ENCOUNTER — Encounter (HOSPITAL_COMMUNITY)
Admission: RE | Disposition: A | Discharge status: Home Health | Payer: Self-pay | Source: Ambulatory Visit | Attending: Orthopedic Surgery

## 2016-08-18 ENCOUNTER — Inpatient Hospital Stay (HOSPITAL_COMMUNITY)
Admission: RE | Admit: 2016-08-18 | Discharge: 2016-08-20 | DRG: 470 | Disposition: A | Discharge status: Home Health | Payer: PPO | Source: Ambulatory Visit | Attending: Orthopedic Surgery | Admitting: Orthopedic Surgery

## 2016-08-18 ENCOUNTER — Inpatient Hospital Stay (HOSPITAL_COMMUNITY): Payer: PPO | Admitting: Anesthesiology

## 2016-08-18 ENCOUNTER — Encounter (HOSPITAL_COMMUNITY): Payer: Self-pay

## 2016-08-18 DIAGNOSIS — M1712 Unilateral primary osteoarthritis, left knee: Secondary | ICD-10-CM | POA: Diagnosis present

## 2016-08-18 DIAGNOSIS — M169 Osteoarthritis of hip, unspecified: Secondary | ICD-10-CM

## 2016-08-18 DIAGNOSIS — M25512 Pain in left shoulder: Secondary | ICD-10-CM | POA: Diagnosis not present

## 2016-08-18 DIAGNOSIS — F17200 Nicotine dependence, unspecified, uncomplicated: Secondary | ICD-10-CM | POA: Diagnosis present

## 2016-08-18 DIAGNOSIS — I10 Essential (primary) hypertension: Secondary | ICD-10-CM | POA: Diagnosis present

## 2016-08-18 DIAGNOSIS — M1612 Unilateral primary osteoarthritis, left hip: Secondary | ICD-10-CM | POA: Diagnosis not present

## 2016-08-18 DIAGNOSIS — M810 Age-related osteoporosis without current pathological fracture: Secondary | ICD-10-CM | POA: Diagnosis present

## 2016-08-18 DIAGNOSIS — Z79899 Other long term (current) drug therapy: Secondary | ICD-10-CM

## 2016-08-18 DIAGNOSIS — G8929 Other chronic pain: Secondary | ICD-10-CM | POA: Diagnosis not present

## 2016-08-18 DIAGNOSIS — F329 Major depressive disorder, single episode, unspecified: Secondary | ICD-10-CM | POA: Diagnosis present

## 2016-08-18 DIAGNOSIS — Z96642 Presence of left artificial hip joint: Secondary | ICD-10-CM | POA: Diagnosis not present

## 2016-08-18 DIAGNOSIS — M797 Fibromyalgia: Secondary | ICD-10-CM | POA: Diagnosis present

## 2016-08-18 DIAGNOSIS — K219 Gastro-esophageal reflux disease without esophagitis: Secondary | ICD-10-CM | POA: Diagnosis present

## 2016-08-18 DIAGNOSIS — M25552 Pain in left hip: Secondary | ICD-10-CM | POA: Diagnosis not present

## 2016-08-18 DIAGNOSIS — J449 Chronic obstructive pulmonary disease, unspecified: Secondary | ICD-10-CM | POA: Diagnosis not present

## 2016-08-18 DIAGNOSIS — Z96649 Presence of unspecified artificial hip joint: Secondary | ICD-10-CM

## 2016-08-18 DIAGNOSIS — Z96651 Presence of right artificial knee joint: Secondary | ICD-10-CM | POA: Diagnosis present

## 2016-08-18 DIAGNOSIS — Z471 Aftercare following joint replacement surgery: Secondary | ICD-10-CM | POA: Diagnosis not present

## 2016-08-18 DIAGNOSIS — D649 Anemia, unspecified: Secondary | ICD-10-CM | POA: Diagnosis not present

## 2016-08-18 DIAGNOSIS — Z981 Arthrodesis status: Secondary | ICD-10-CM | POA: Diagnosis not present

## 2016-08-18 HISTORY — PX: TOTAL HIP ARTHROPLASTY: SHX124

## 2016-08-18 LAB — TYPE AND SCREEN
ABO/RH(D): A NEG
ANTIBODY SCREEN: NEGATIVE

## 2016-08-18 SURGERY — ARTHROPLASTY, HIP, TOTAL, ANTERIOR APPROACH
Anesthesia: General | Site: Hip | Laterality: Left

## 2016-08-18 MED ORDER — PROPOFOL 10 MG/ML IV BOLUS
INTRAVENOUS | Status: DC | PRN
  Administered 2016-08-18: 70 mg via INTRAVENOUS
  Administered 2016-08-18: 50 mg via INTRAVENOUS

## 2016-08-18 MED ORDER — FENTANYL CITRATE (PF) 100 MCG/2ML IJ SOLN
INTRAMUSCULAR | Status: AC
  Filled 2016-08-18: qty 2

## 2016-08-18 MED ORDER — MIDAZOLAM HCL 2 MG/2ML IJ SOLN
INTRAMUSCULAR | Status: AC
  Filled 2016-08-18: qty 2

## 2016-08-18 MED ORDER — ATROPINE SULFATE 0.4 MG/ML IV SOSY: PREFILLED_SYRINGE | INTRAVENOUS | Status: DC | PRN

## 2016-08-18 MED ORDER — BUPIVACAINE IN DEXTROSE 0.75-8.25 % IT SOLN
INTRATHECAL | Status: DC | PRN
  Administered 2016-08-18: 10.5 mg via INTRATHECAL

## 2016-08-18 MED ORDER — ACETAMINOPHEN 500 MG PO TABS
1000.0000 mg | ORAL_TABLET | Freq: Four times a day (QID) | ORAL | Status: AC
  Administered 2016-08-18 – 2016-08-19 (×4): 1000 mg via ORAL
  Filled 2016-08-18 (×4): qty 2

## 2016-08-18 MED ORDER — CEFAZOLIN SODIUM-DEXTROSE 2-4 GM/100ML-% IV SOLN: 2.0000 g | Freq: Four times a day (QID) | INTRAVENOUS | Status: DC

## 2016-08-18 MED ORDER — FLUTICASONE PROPIONATE 50 MCG/ACT NA SUSP
1.0000 | Freq: Every day | NASAL | Status: DC | PRN
  Administered 2016-08-19 – 2016-08-20 (×2): 1 via NASAL
  Filled 2016-08-18: qty 16

## 2016-08-18 MED ORDER — GABAPENTIN 300 MG PO CAPS
300.0000 mg | ORAL_CAPSULE | Freq: Two times a day (BID) | ORAL | Status: DC
  Administered 2016-08-19 – 2016-08-20 (×3): 300 mg via ORAL
  Filled 2016-08-18 (×3): qty 1

## 2016-08-18 MED ORDER — LIDOCAINE 2% (20 MG/ML) 5 ML SYRINGE
INTRAMUSCULAR | Status: AC
  Filled 2016-08-18: qty 5

## 2016-08-18 MED ORDER — ACETAMINOPHEN 650 MG RE SUPP: 650.0000 mg | Freq: Four times a day (QID) | RECTAL | Status: DC | PRN

## 2016-08-18 MED ORDER — FENTANYL CITRATE (PF) 100 MCG/2ML IJ SOLN
INTRAMUSCULAR | Status: AC
  Administered 2016-08-18: 25 ug via INTRAVENOUS
  Filled 2016-08-18: qty 4

## 2016-08-18 MED ORDER — 0.9 % SODIUM CHLORIDE (POUR BTL) OPTIME
TOPICAL | Status: DC | PRN
  Administered 2016-08-18: 1000 mL

## 2016-08-18 MED ORDER — OXYCODONE HCL 5 MG PO TABS
30.0000 mg | ORAL_TABLET | ORAL | Status: DC | PRN
  Administered 2016-08-18 – 2016-08-20 (×8): 30 mg via ORAL
  Filled 2016-08-18 (×9): qty 6

## 2016-08-18 MED ORDER — ONDANSETRON HCL 4 MG/2ML IJ SOLN
INTRAMUSCULAR | Status: AC
  Filled 2016-08-18: qty 2

## 2016-08-18 MED ORDER — ACETAMINOPHEN 325 MG PO TABS
650.0000 mg | ORAL_TABLET | Freq: Four times a day (QID) | ORAL | Status: DC | PRN
  Filled 2016-08-18: qty 2

## 2016-08-18 MED ORDER — METOPROLOL TARTRATE 25 MG PO TABS
25.0000 mg | ORAL_TABLET | Freq: Two times a day (BID) | ORAL | Status: DC
  Administered 2016-08-18 – 2016-08-20 (×2): 25 mg via ORAL
  Filled 2016-08-18 (×4): qty 1

## 2016-08-18 MED ORDER — EPHEDRINE 5 MG/ML INJ
INTRAVENOUS | Status: AC
  Filled 2016-08-18: qty 10

## 2016-08-18 MED ORDER — ACETAMINOPHEN 10 MG/ML IV SOLN
INTRAVENOUS | Status: AC
  Filled 2016-08-18: qty 100

## 2016-08-18 MED ORDER — SODIUM CHLORIDE 0.9 % IV SOLN
INTRAVENOUS | Status: DC
  Administered 2016-08-18 – 2016-08-20 (×3): via INTRAVENOUS

## 2016-08-18 MED ORDER — DEXAMETHASONE SODIUM PHOSPHATE 10 MG/ML IJ SOLN
10.0000 mg | Freq: Once | INTRAMUSCULAR | Status: AC
  Administered 2016-08-18: 10 mg via INTRAVENOUS

## 2016-08-18 MED ORDER — ONDANSETRON HCL 4 MG/2ML IJ SOLN
INTRAMUSCULAR | Status: DC | PRN
  Administered 2016-08-18: 4 mg via INTRAVENOUS

## 2016-08-18 MED ORDER — CEFAZOLIN SODIUM-DEXTROSE 2-4 GM/100ML-% IV SOLN
INTRAVENOUS | Status: AC
  Filled 2016-08-18: qty 100

## 2016-08-18 MED ORDER — ONDANSETRON HCL 4 MG PO TABS: 4.0000 mg | ORAL_TABLET | Freq: Four times a day (QID) | ORAL | Status: DC | PRN

## 2016-08-18 MED ORDER — TRANEXAMIC ACID 1000 MG/10ML IV SOLN
1000.0000 mg | Freq: Once | INTRAVENOUS | Status: AC
  Administered 2016-08-18: 1000 mg via INTRAVENOUS
  Filled 2016-08-18: qty 1100

## 2016-08-18 MED ORDER — RIVAROXABAN 10 MG PO TABS
10.0000 mg | ORAL_TABLET | Freq: Every day | ORAL | Status: DC
  Administered 2016-08-19 – 2016-08-20 (×2): 10 mg via ORAL
  Filled 2016-08-18 (×2): qty 1

## 2016-08-18 MED ORDER — FENTANYL CITRATE (PF) 100 MCG/2ML IJ SOLN
INTRAMUSCULAR | Status: DC | PRN
  Administered 2016-08-18 (×4): 50 ug via INTRAVENOUS

## 2016-08-18 MED ORDER — CEFAZOLIN SODIUM-DEXTROSE 2-4 GM/100ML-% IV SOLN
2.0000 g | INTRAVENOUS | Status: AC
  Administered 2016-08-18: 2 g via INTRAVENOUS

## 2016-08-18 MED ORDER — PROPOFOL 10 MG/ML IV BOLUS
INTRAVENOUS | Status: AC
  Filled 2016-08-18: qty 60

## 2016-08-18 MED ORDER — FENTANYL CITRATE (PF) 100 MCG/2ML IJ SOLN
25.0000 ug | INTRAMUSCULAR | Status: DC | PRN
  Administered 2016-08-18 (×2): 25 ug via INTRAVENOUS

## 2016-08-18 MED ORDER — ONDANSETRON HCL 4 MG/2ML IJ SOLN: 4.0000 mg | Freq: Four times a day (QID) | INTRAMUSCULAR | Status: DC | PRN

## 2016-08-18 MED ORDER — GABAPENTIN 300 MG PO CAPS
900.0000 mg | ORAL_CAPSULE | Freq: Every day | ORAL | Status: DC
  Administered 2016-08-18 – 2016-08-19 (×2): 900 mg via ORAL
  Filled 2016-08-18 (×2): qty 3

## 2016-08-18 MED ORDER — DEXAMETHASONE SODIUM PHOSPHATE 10 MG/ML IJ SOLN
10.0000 mg | Freq: Once | INTRAMUSCULAR | Status: AC
  Administered 2016-08-19: 09:00:00 10 mg via INTRAVENOUS
  Filled 2016-08-18: qty 1

## 2016-08-18 MED ORDER — EPHEDRINE SULFATE-NACL 50-0.9 MG/10ML-% IV SOSY
PREFILLED_SYRINGE | INTRAVENOUS | Status: DC | PRN
  Administered 2016-08-18 (×3): 5 mg via INTRAVENOUS
  Administered 2016-08-18 (×2): 10 mg via INTRAVENOUS

## 2016-08-18 MED ORDER — BUPIVACAINE HCL (PF) 0.25 % IJ SOLN
INTRAMUSCULAR | Status: AC
  Filled 2016-08-18: qty 30

## 2016-08-18 MED ORDER — METOCLOPRAMIDE HCL 5 MG/ML IJ SOLN: 5.0000 mg | Freq: Three times a day (TID) | INTRAMUSCULAR | Status: DC | PRN

## 2016-08-18 MED ORDER — SODIUM CHLORIDE 0.9 % IJ SOLN
INTRAMUSCULAR | Status: AC
  Filled 2016-08-18: qty 10

## 2016-08-18 MED ORDER — ATROPINE SULFATE 0.4 MG/ML IV SOSY
PREFILLED_SYRINGE | INTRAVENOUS | Status: DC | PRN
  Administered 2016-08-18: .4 mg via INTRAVENOUS

## 2016-08-18 MED ORDER — TRANEXAMIC ACID 1000 MG/10ML IV SOLN
1000.0000 mg | INTRAVENOUS | Status: AC
  Administered 2016-08-18: 1000 mg via INTRAVENOUS
  Filled 2016-08-18: qty 1100

## 2016-08-18 MED ORDER — MENTHOL 3 MG MT LOZG: 1.0000 | LOZENGE | OROMUCOSAL | Status: DC | PRN

## 2016-08-18 MED ORDER — MORPHINE SULFATE (PF) 4 MG/ML IV SOLN: 1.0000 mg | INTRAVENOUS | Status: DC | PRN

## 2016-08-18 MED ORDER — ACETAMINOPHEN 10 MG/ML IV SOLN
1000.0000 mg | Freq: Once | INTRAVENOUS | Status: AC
  Administered 2016-08-18: 1000 mg via INTRAVENOUS

## 2016-08-18 MED ORDER — CEFAZOLIN SODIUM-DEXTROSE 2-4 GM/100ML-% IV SOLN
2.0000 g | Freq: Three times a day (TID) | INTRAVENOUS | Status: AC
  Administered 2016-08-18 – 2016-08-19 (×2): 2 g via INTRAVENOUS
  Filled 2016-08-18 (×2): qty 100

## 2016-08-18 MED ORDER — GABAPENTIN 300 MG PO CAPS: 300.0000 mg | ORAL_CAPSULE | Freq: Three times a day (TID) | ORAL | Status: DC

## 2016-08-18 MED ORDER — FLEET ENEMA 7-19 GM/118ML RE ENEM: 1.0000 | ENEMA | Freq: Once | RECTAL | Status: DC | PRN

## 2016-08-18 MED ORDER — LACTATED RINGERS IV SOLN
INTRAVENOUS | Status: DC
  Administered 2016-08-18: 15:00:00 via INTRAVENOUS
  Administered 2016-08-18: 1000 mL via INTRAVENOUS

## 2016-08-18 MED ORDER — DOCUSATE SODIUM 100 MG PO CAPS
100.0000 mg | ORAL_CAPSULE | Freq: Two times a day (BID) | ORAL | Status: DC
  Administered 2016-08-18 – 2016-08-20 (×4): 100 mg via ORAL
  Filled 2016-08-18 (×4): qty 1

## 2016-08-18 MED ORDER — POLYETHYLENE GLYCOL 3350 17 G PO PACK
17.0000 g | PACK | Freq: Every day | ORAL | Status: DC | PRN
  Administered 2016-08-20: 17 g via ORAL
  Filled 2016-08-18: qty 1

## 2016-08-18 MED ORDER — BUPIVACAINE HCL (PF) 0.25 % IJ SOLN
INTRAMUSCULAR | Status: DC | PRN
  Administered 2016-08-18: 30 mL

## 2016-08-18 MED ORDER — MIDAZOLAM HCL 5 MG/5ML IJ SOLN
INTRAMUSCULAR | Status: DC | PRN
  Administered 2016-08-18 (×2): 1 mg via INTRAVENOUS

## 2016-08-18 MED ORDER — STERILE WATER FOR IRRIGATION IR SOLN
Status: DC | PRN
  Administered 2016-08-18: 2000 mL

## 2016-08-18 MED ORDER — TORSEMIDE 10 MG PO TABS
10.0000 mg | ORAL_TABLET | Freq: Every day | ORAL | Status: DC | PRN
  Filled 2016-08-18: qty 1

## 2016-08-18 MED ORDER — DEXAMETHASONE SODIUM PHOSPHATE 10 MG/ML IJ SOLN
INTRAMUSCULAR | Status: AC
  Filled 2016-08-18: qty 1

## 2016-08-18 MED ORDER — PHENOL 1.4 % MT LIQD: 1.0000 | OROMUCOSAL | Status: DC | PRN

## 2016-08-18 MED ORDER — METHOCARBAMOL 1000 MG/10ML IJ SOLN
500.0000 mg | Freq: Four times a day (QID) | INTRAVENOUS | Status: DC | PRN
  Administered 2016-08-18: 500 mg via INTRAVENOUS
  Filled 2016-08-18: qty 550

## 2016-08-18 MED ORDER — BISACODYL 10 MG RE SUPP: 10.0000 mg | Freq: Every day | RECTAL | Status: DC | PRN

## 2016-08-18 MED ORDER — CHLORHEXIDINE GLUCONATE 4 % EX LIQD: 60.0000 mL | Freq: Once | CUTANEOUS | Status: DC

## 2016-08-18 MED ORDER — METOCLOPRAMIDE HCL 5 MG PO TABS: 5.0000 mg | ORAL_TABLET | Freq: Three times a day (TID) | ORAL | Status: DC | PRN

## 2016-08-18 MED ORDER — ALPRAZOLAM 1 MG PO TABS
1.0000 mg | ORAL_TABLET | Freq: Three times a day (TID) | ORAL | Status: DC | PRN
  Administered 2016-08-18 – 2016-08-19 (×3): 1 mg via ORAL
  Filled 2016-08-18 (×3): qty 1

## 2016-08-18 MED ORDER — DIPHENHYDRAMINE HCL 12.5 MG/5ML PO ELIX: 12.5000 mg | ORAL_SOLUTION | ORAL | Status: DC | PRN

## 2016-08-18 MED ORDER — METHOCARBAMOL 500 MG PO TABS
500.0000 mg | ORAL_TABLET | Freq: Four times a day (QID) | ORAL | Status: DC | PRN
  Administered 2016-08-19 – 2016-08-20 (×2): 500 mg via ORAL
  Filled 2016-08-18 (×2): qty 1

## 2016-08-18 MED ORDER — SERTRALINE HCL 100 MG PO TABS
100.0000 mg | ORAL_TABLET | Freq: Every day | ORAL | Status: DC
  Administered 2016-08-18 – 2016-08-19 (×2): 100 mg via ORAL
  Filled 2016-08-18 (×2): qty 1

## 2016-08-18 SURGICAL SUPPLY — 38 items
BAG DECANTER FOR FLEXI CONT (MISCELLANEOUS) ×1 IMPLANT
BAG SPEC THK2 15X12 ZIP CLS (MISCELLANEOUS)
BAG ZIPLOCK 12X15 (MISCELLANEOUS) IMPLANT
BLADE SAG 18X100X1.27 (BLADE) ×3 IMPLANT
CAPT HIP TOTAL 2 ×2 IMPLANT
CLOSURE WOUND 1/2 X4 (GAUZE/BANDAGES/DRESSINGS) ×2
CLOTH BEACON ORANGE TIMEOUT ST (SAFETY) ×3 IMPLANT
COVER PERINEAL POST (MISCELLANEOUS) ×3 IMPLANT
COVER SURGICAL LIGHT HANDLE (MISCELLANEOUS) ×3 IMPLANT
DECANTER SPIKE VIAL GLASS SM (MISCELLANEOUS) ×3 IMPLANT
DRAPE STERI IOBAN 125X83 (DRAPES) ×3 IMPLANT
DRAPE U-SHAPE 47X51 STRL (DRAPES) ×6 IMPLANT
DRSG ADAPTIC 3X8 NADH LF (GAUZE/BANDAGES/DRESSINGS) ×3 IMPLANT
DRSG EMULSION OIL 3X16 NADH (GAUZE/BANDAGES/DRESSINGS) ×2 IMPLANT
DRSG MEPILEX BORDER 4X4 (GAUZE/BANDAGES/DRESSINGS) ×3 IMPLANT
DRSG MEPILEX BORDER 4X8 (GAUZE/BANDAGES/DRESSINGS) ×3 IMPLANT
DURAPREP 26ML APPLICATOR (WOUND CARE) ×3 IMPLANT
ELECT REM PT RETURN 15FT ADLT (MISCELLANEOUS) ×3 IMPLANT
EVACUATOR 1/8 PVC DRAIN (DRAIN) ×3 IMPLANT
GLOVE BIO SURGEON STRL SZ7.5 (GLOVE) ×3 IMPLANT
GLOVE BIO SURGEON STRL SZ8 (GLOVE) ×4 IMPLANT
GLOVE BIOGEL PI IND STRL 8 (GLOVE) ×2 IMPLANT
GLOVE BIOGEL PI INDICATOR 8 (GLOVE) ×4
GOWN STRL REUS W/TWL LRG LVL3 (GOWN DISPOSABLE) ×3 IMPLANT
GOWN STRL REUS W/TWL XL LVL3 (GOWN DISPOSABLE) ×3 IMPLANT
PACK ANTERIOR HIP CUSTOM (KITS) ×3 IMPLANT
STRIP CLOSURE SKIN 1/2X4 (GAUZE/BANDAGES/DRESSINGS) ×3 IMPLANT
SUT ETHIBOND NAB CT1 #1 30IN (SUTURE) ×3 IMPLANT
SUT MNCRL AB 4-0 PS2 18 (SUTURE) ×3 IMPLANT
SUT STRATAFIX 0 PDS 27 VIOLET (SUTURE)
SUT VIC AB 2-0 CT1 27 (SUTURE) ×6
SUT VIC AB 2-0 CT1 TAPERPNT 27 (SUTURE) ×2 IMPLANT
SUT VLOC 180 0 24IN GS25 (SUTURE) ×2 IMPLANT
SUTURE STRATFX 0 PDS 27 VIOLET (SUTURE) ×1 IMPLANT
SYR 50ML LL SCALE MARK (SYRINGE) IMPLANT
TRAY FOLEY CATH 14FRSI W/METER (CATHETERS) ×2 IMPLANT
TRAY FOLEY W/METER SILVER 16FR (SET/KITS/TRAYS/PACK) ×1 IMPLANT
YANKAUER SUCT BULB TIP 10FT TU (MISCELLANEOUS) ×3 IMPLANT

## 2016-08-18 NOTE — Discharge Instructions (Addendum)
° °Dr. Frank Aluisio °Total Joint Specialist °[facility] °[address] °[place], [place] [address] °[phone] ° °ANTERIOR APPROACH TOTAL HIP REPLACEMENT POSTOPERATIVE DIRECTIONS ° ° °Hip Rehabilitation, Guidelines Following Surgery  °The results of a hip operation are greatly improved after range of motion and muscle strengthening exercises. Follow all safety measures which are given to protect your hip. If any of these exercises cause increased pain or swelling in your joint, decrease the amount until you are comfortable again. Then slowly increase the exercises. Call your caregiver if you have problems or questions.  ° °HOME CARE INSTRUCTIONS  °Remove items at home which could result in a fall. This includes throw rugs or furniture in walking pathways.  °· ICE to the affected hip every three hours for 30 minutes at a time and then as needed for pain and swelling.  Continue to use ice on the hip for pain and swelling from surgery. You may notice swelling that will progress down to the foot and ankle.  This is normal after surgery.  Elevate the leg when you are not up walking on it.   °· Continue to use the breathing machine which will help keep your temperature down.  It is common for your temperature to cycle up and down following surgery, especially at night when you are not up moving around and exerting yourself.  The breathing machine keeps your lungs expanded and your temperature down. ° ° °DIET °You may resume your previous home diet once your are discharged from the hospital. ° °DRESSING / WOUND CARE / SHOWERING °You may shower 3 days after surgery, but keep the wounds dry during showering.  You may use an occlusive plastic wrap (Press'n Seal for example), NO SOAKING/SUBMERGING IN THE BATHTUB.  If the bandage gets wet, change with a clean dry gauze.  If the incision gets wet, pat the wound dry with a clean towel. °You may start showering once you are discharged home but do not  submerge the incision under water. Just pat the incision dry and apply a dry gauze dressing on daily. °Change the surgical dressing daily and reapply a dry dressing each time. ° °ACTIVITY °Walk with your walker as instructed. °Use walker as long as suggested by your caregivers. °Avoid periods of inactivity such as sitting longer than an hour when not asleep. This helps prevent blood clots.  °You may resume a sexual relationship in one month or when given the OK by your doctor.  °You may return to work once you are cleared by your doctor.  °Do not drive a car for 6 weeks or until released by you surgeon.  °Do not drive while taking narcotics. ° °WEIGHT BEARING °Weight bearing as tolerated with assist device (walker, cane, etc) as directed, use it as long as suggested by your surgeon or therapist, typically at least 4-6 weeks. ° °POSTOPERATIVE CONSTIPATION PROTOCOL °Constipation - defined medically as fewer than three stools per week and severe constipation as less than one stool per week. ° °One of the most common issues patients have following surgery is constipation.  Even if you have a regular bowel pattern at home, your normal regimen is likely to be disrupted due to multiple reasons following surgery.  Combination of anesthesia, postoperative narcotics, change in appetite and fluid intake all can affect your bowels.  In order to avoid complications following surgery, here are some recommendations in order to help you during your recovery period. ° °Colace (docusate) - Pick up an over-the-counter   form of Colace or another stool softener and take twice a day as long as you are requiring postoperative pain medications.  Take with a full glass of water daily.  If you experience loose stools or diarrhea, hold the colace until you stool forms back up.  If your symptoms do not get better within 1 week or if they get worse, check with your doctor. ° °Dulcolax (bisacodyl) - Pick up over-the-counter and take as directed  by the product packaging as needed to assist with the movement of your bowels.  Take with a full glass of water.  Use this product as needed if not relieved by Colace only.  ° °MiraLax (polyethylene glycol) - Pick up over-the-counter to have on hand.  MiraLax is a solution that will increase the amount of water in your bowels to assist with bowel movements.  Take as directed and can mix with a glass of water, juice, soda, coffee, or tea.  Take if you go more than two days without a movement. °Do not use MiraLax more than once per day. Call your doctor if you are still constipated or irregular after using this medication for 7 days in a row. ° °If you continue to have problems with postoperative constipation, please contact the office for further assistance and recommendations.  If you experience "the worst abdominal pain ever" or develop nausea or vomiting, please contact the office immediatly for further recommendations for treatment. ° °ITCHING ° If you experience itching with your medications, try taking only a single pain pill, or even half a pain pill at a time.  You can also use Benadryl over the counter for itching or also to help with sleep.  ° °TED HOSE STOCKINGS °Wear the elastic stockings on both legs for three weeks following surgery during the day but you may remove then at night for sleeping. ° °MEDICATIONS °See your medication summary on the “After Visit Summary” that the nursing staff will review with you prior to discharge.  You may have some home medications which will be placed on hold until you complete the course of blood thinner medication.  It is important for you to complete the blood thinner medication as prescribed by your surgeon.  Continue your approved medications as instructed at time of discharge. ° °PRECAUTIONS °If you experience chest pain or shortness of breath - call 911 immediately for transfer to the hospital emergency department.  °If you develop a fever greater that 101 F,  purulent drainage from wound, increased redness or drainage from wound, foul odor from the wound/dressing, or calf pain - CONTACT YOUR SURGEON.   °                                                °FOLLOW-UP APPOINTMENTS °Make sure you keep all of your appointments after your operation with your surgeon and caregivers. You should call the office at the above phone number and make an appointment for approximately two weeks after the date of your surgery or on the date instructed by your surgeon outlined in the "After Visit Summary". ° °RANGE OF MOTION AND STRENGTHENING EXERCISES  °These exercises are designed to help you keep full movement of your hip joint. Follow your caregiver's or physical therapist's instructions. Perform all exercises about fifteen times, three times per day or as directed. Exercise both hips, even if you   have had only one joint replacement. These exercises can be done on a training (exercise) mat, on the floor, on a table or on a bed. Use whatever works the best and is most comfortable for you. Use music or television while you are exercising so that the exercises are a pleasant break in your day. This will make your life better with the exercises acting as a break in routine you can look forward to.  Lying on your back, slowly slide your foot toward your buttocks, raising your knee up off the floor. Then slowly slide your foot back down until your leg is straight again.  Lying on your back spread your legs as far apart as you can without causing discomfort.  Lying on your side, raise your upper leg and foot straight up from the floor as far as is comfortable. Slowly lower the leg and repeat.  Lying on your back, tighten up the muscle in the front of your thigh (quadriceps muscles). You can do this by keeping your leg straight and trying to raise your heel off the floor. This helps strengthen the largest muscle supporting your knee.  Lying on your back, tighten up the muscles of your  buttocks both with the legs straight and with the knee bent at a comfortable angle while keeping your heel on the floor.   IF YOU ARE TRANSFERRED TO A SKILLED REHAB FACILITY If the patient is transferred to a skilled rehab facility following release from the hospital, a list of the current medications will be sent to the facility for the patient to continue.  When discharged from the skilled rehab facility, please have the facility set up the patient's [place] prior to being released. Also, the skilled facility will be responsible for providing the patient with their medications at time of release from the facility to include their pain medication, the muscle relaxants, and their blood thinner medication. If the patient is still at the rehab facility at time of the two week follow up appointment, the skilled rehab facility will also need to assist the patient in arranging follow up appointment in our office and any transportation needs.  MAKE SURE YOU:  Understand these instructions.  Get help right away if you are not doing well or get worse.    Pick up stool softner and laxative for home use following surgery while on pain medications. Do not submerge incision under water. Please use good hand washing techniques while changing dressing each day. May shower starting three days after surgery. Please use a clean towel to pat the incision dry following showers. Continue to use ice for pain and swelling after surgery. Do not use any lotions or creams on the incision until instructed by your surgeon.  Take Xarelto for two and a half more weeks following discharge from the hospital, then discontinue Xarelto. Once the patient has completed the blood thinner regimen, then take a Baby 81 mg Aspirin daily for three more weeks.     Information on my medicine - XARELTO (Rivaroxaban)  Why was Xarelto prescribed for you? Xarelto was prescribed for you to reduce the risk of blood  clots forming after orthopedic surgery. The medical term for these abnormal blood clots is venous thromboembolism (VTE).  What do you need to know about xarelto ? Take your Xarelto ONCE DAILY at the same time every day. You may take it either with or without food.  If you have difficulty swallowing the tablet whole, you may  crush it and mix in applesauce just prior to taking your dose.  Take Xarelto exactly as prescribed by your doctor and DO NOT stop taking Xarelto without talking to the doctor who prescribed the medication.  Stopping without other VTE prevention medication to take the place of Xarelto may increase your risk of developing a clot.  After discharge, you should have regular check-up appointments with your healthcare provider that is prescribing your Xarelto.    What do you do if you miss a dose? If you miss a dose, take it as soon as you remember on the same day then continue your regularly scheduled once daily regimen the next day. Do not take two doses of Xarelto on the same day.   Important Safety Information A possible side effect of Xarelto is bleeding. You should call your healthcare provider right away if you experience any of the following: ? Bleeding from an injury or your nose that does not stop. ? Unusual colored urine (red or dark brown) or unusual colored stools (red or black). ? Unusual bruising for unknown reasons. ? A serious fall or if you hit your head (even if there is no bleeding).  Some medicines may interact with Xarelto and might increase your risk of bleeding while on Xarelto. To help avoid this, consult your healthcare provider or pharmacist prior to using any new prescription or non-prescription medications, including herbals, vitamins, non-steroidal anti-inflammatory drugs (NSAIDs) and supplements.  This website has more information on Xarelto: [email or web address]

## 2016-08-18 NOTE — Anesthesia Procedure Notes (Signed)
Spinal  End time: 08/18/2016 2:01 PM Staffing Anesthesiologist: Annye Asa Performed: anesthesiologist  Preanesthetic Checklist Completed: patient identified, site marked, surgical consent, pre-op evaluation, timeout performed, IV checked, risks and benefits discussed and monitors and equipment checked Spinal Block Patient position: sitting Prep: Betadine and site prepped and draped Patient monitoring: heart rate, cardiac monitor, continuous pulse ox and blood pressure Approach: midline Location: L3-4 Injection technique: single-shot Needle Needle type: Quincke  Needle gauge: 25 G Needle length: 9 cm Additional Notes Pt identified in Operating room.  Monitors applied. Working IV access confirmed. Sterile prep, drape lumbar spine.  Reardon, CRNA 1% lido local L 2,3, unable to enter CSF with #24ga Pencan, 1% lido local L 3,4 and #25ga Quincke into clear CSF L 3,4.  10.5mg  0.75% Bupivacaine with dextrose injected with asp CSF beginning and end of injection.  Patient asymptomatic, VSS, no heme aspirated, tolerated well.  Jenita Seashore, MD

## 2016-08-18 NOTE — H&P (View-Only) (Signed)
Shirley [name] DOB: May 10, 1940 Married / Language: English / Race: White Female Date of Admission:  08/18/2016 CC:  Left Hip Pain History of Present Illness The patient is a 76 year old female who comes in for a preoperative History and Physical. The patient is scheduled for a left total hip arthroplasty (anterior) to be performed by Dr. Dione Plover. Aluisio, MD at [facility] on 08/18/2016. The patient is a 76 year old female who presented for follow up of their hip. The patient is being followed for their left hip pain and osteoarthritis. They are year(s) out from when symptoms began. Symptoms reported include: pain. The patient feels that they are doing poorly and report their pain level to be moderate to severe. The following medication has been used for pain control: Oxycodone. The patient has not gotten any relief of their symptoms with Cortisone injections (Left hip IA injection w/ Dr. Marcelino Scot mid March: didn't help). She is having progressive worsening pain and dysfunction in that left hip. It is hurting at all times. It is limiting what she can and cannot do. Review of her radiographs show severe bone on bone arthritis with some erosion of the acetabulum. It is mainly superior erosion. She has got advance end stage arthritis of that hip, which is rapidly progressive in nature. At this point, the most predictable means of improving pain and function is going to be a total hip arthroplasty. They have been treated conservatively in the past for the above stated problem and despite conservative measures, they continue to have progressive pain and severe functional limitations and dysfunction. They have failed non-operative management including home exercise, medications, and injections. It is felt that they would benefit from undergoing total joint replacement. Risks and benefits of the procedure have been discussed with the patient and they elect to proceed with surgery. There are no active  contraindications to surgery such as ongoing infection or rapidly progressive neurological disease.   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 father Congestive Heart Failure  First Degree Relatives. father Rheumatoid Arthritis  brother Osteoarthritis  mother, grandmother mothers side, grandfather mothers side and grandmother fathers side Depression  sister Hypertension  father Osteoporosis  mother and sister  Social History  Children  3 Tobacco / smoke exposure  no Drug/Alcohol Rehab (Previously)  no Drug/Alcohol Rehab (Currently)  no Tobacco use  Current some day smoker. current some days smoker Living situation  live with spouse Pain Contract  yes Illicit drug use  yes Exercise  Exercises weekly; does running / walking Current work status  retired Alcohol use  never consumed alcohol Number of flights of stairs before winded  greater than 5 Marital status  married  Medication History  Vitamin D (50000U Tablet, 1 (one) Oral) Active. Xanax (1MG  Tablet, Oral) Active. OxyCODONE HCl (30MG  Tablet, Oral QID PRN) Active. Gabapentin (300MG  Capsule, Oral) Active. (1 AM, 1 PM, 3 QHS) Torsemide (10MG  Tablet, Oral) Active. Ibuprofen (200MG  Tablet, Oral) Active.  Past Surgical History Tubal Ligation  Spinal Fusion  lower back Spinal Surgery  Hysterectomy  complete (non-cancerous) Leg Circulation  Surgery  right Other Orthopaedic Surgery  Appendectomy  Tonsillectomy  Cataract Surgery  left Foot Surgery  bilateral    Review of Systems  General Not Present- Chills, Fatigue, Fever, Memory Loss, Night Sweats, Weight Gain and Weight Loss. Skin Not Present- Eczema, Hives, Itching, Lesions and Rash. HEENT Not Present- Dentures, Double Vision, Headache, Hearing Loss, Tinnitus and Visual Loss. Respiratory Not Present- Allergies, Chronic Cough, Coughing up blood, Shortness of breath at rest and Shortness of breath with exertion. Cardiovascular Not Present- Chest Pain, Difficulty Breathing Lying Down, Murmur, Palpitations, Racing/skipping heartbeats and Swelling. Gastrointestinal Not Present- Abdominal Pain, Bloody Stool, Constipation, Diarrhea, Difficulty Swallowing, Heartburn, Jaundice, Loss of appetitie, Nausea and Vomiting. Female Genitourinary Not Present- Blood in Urine, Discharge, Flank Pain, Incontinence, Painful Urination, Urgency, Urinary frequency, Urinary Retention, Urinating at Night and Weak urinary stream. Musculoskeletal Present- Joint Pain. Not Present- Back Pain, Joint Swelling, Morning Stiffness, Muscle Pain, Muscle Weakness and Spasms. Neurological Not Present- Blackout spells, Difficulty with balance, Dizziness, Paralysis, Tremor and Weakness. Psychiatric Not Present- Insomnia.  Vitals  Weight: 162 lb Height: 64in Weight was reported by patient. Height was reported by patient. Body Surface Area: 1.79 m Body Mass Index: 27.81 kg/m  Pulse: 56 (Regular)  BP: 134/76 (Sitting, Right Arm, Standard)       Physical Exam  General Mental Status -Alert, cooperative and good historian. General Appearance-pleasant, Not in acute distress. Orientation-Oriented X3. Build & Nutrition-Well nourished and Well developed.  Head and Neck Head-normocephalic, atraumatic . Neck Global Assessment - supple, no bruit auscultated on the right, no bruit  auscultated on the left.  Eye Vision-Wears contact lenses. Pupil - Bilateral-Regular and Round. Motion - Bilateral-EOMI.  Chest and Lung Exam Auscultation Breath sounds - clear at anterior chest wall and clear at posterior chest wall. Adventitious sounds - No Adventitious sounds.  Cardiovascular Auscultation Rhythm - Regular rate and rhythm. Heart Sounds - S1 WNL and S2 WNL. Murmurs & Other Heart Sounds - Auscultation of the heart reveals - No Murmurs.  Abdomen Palpation/Percussion Tenderness - Abdomen is non-tender to palpation. Rigidity (guarding) - Abdomen is soft. Auscultation Auscultation of the abdomen reveals - Bowel sounds normal.  Female Genitourinary Note: Not done, not pertinent to present illness   Musculoskeletal Note: She is in no distress. Evaluation of her left hip flexion is about 90, no internal rotation, about 10 to 20 of external rotation, 20 of abduction.  RADIOGRAPH Review of her radiographs show severe bone on bone arthritis with some erosion of the acetabulum. It is mainly superior erosion.   Assessment & Plan  Primary osteoarthritis of left hip (M16.12)  Note:Surgical Plans: Left Total Hip Replacement - Anterior Approach  Disposition: Home with assistance  Cards: Dr. Ellyn Hack - Patient has been seen preoperatively and felt to be stable for surgery.  IV TXA  Anesthesia Issues: None  Patient was instructed on what medications to stop prior to surgery.  Signed electronically by Joelene Millin, III PA-

## 2016-08-18 NOTE — Op Note (Signed)
OPERATIVE REPORT- TOTAL HIP ARTHROPLASTY   PREOPERATIVE DIAGNOSIS: Osteoarthritis of the Left hip.   POSTOPERATIVE DIAGNOSIS: Osteoarthritis of the Left  hip.   PROCEDURE: Left total hip arthroplasty, anterior approach.   SURGEON: Gaynelle Arabian, MD   ASSISTANT: Arlee Muslim, PA-C  ANESTHESIA:  Spinal to General  ESTIMATED BLOOD LOSS:-600 ml    DRAINS: Hemovac x1.   COMPLICATIONS: None   CONDITION: PACU - hemodynamically stable.   BRIEF CLINICAL NOTE: Shirley [name] is a 76 y.o. female who has advanced end-  stage arthritis of their Left  hip with progressively worsening pain and  dysfunction.The patient has failed nonoperative management and presents for  total hip arthroplasty.   PROCEDURE IN DETAIL: After unsuccessful administration of spinal  Anesthetic then successful administration of General, the traction boots for the [facility] bed were placed on both  feet and the patient was placed onto the [facility] bed, boots placed into the leg  holders. The Left hip was then isolated from the perineum with plastic  drapes and prepped and draped in the usual sterile fashion. ASIS and  greater trochanter were marked and a oblique incision was made, starting  at about 1 cm lateral and 2 cm distal to the ASIS and coursing towards  the anterior cortex of the femur. The skin was cut with a 10 blade  through subcutaneous tissue to the level of the fascia overlying the  tensor fascia lata muscle. The fascia was then incised in line with the  incision at the junction of the anterior third and posterior 2/3rd. The  muscle was teased off the fascia and then the interval between the TFL  and the rectus was developed. The Hohmann retractor was then placed at  the top of the femoral neck over the capsule. The vessels overlying the  capsule were cauterized and the fat on top of the capsule was removed.  A Hohmann retractor was then placed anterior underneath the rectus  femoris to give exposure  to the entire anterior capsule. A T-shaped  capsulotomy was performed. The edges were tagged and the femoral head  was identified.       Osteophytes are removed off the superior acetabulum.  The femoral neck was then cut in situ with an oscillating saw. Traction  was then applied to the left lower extremity utilizing the [facility]  traction. The femoral head was then removed. Retractors were placed  around the acetabulum and then circumferential removal of the labrum was  performed. Osteophytes were also removed. Reaming starts at 45 mm to  medialize and  Increased in 2 mm increments to 51 mm. We reamed in  approximately 40 degrees of abduction, 20 degrees anteversion. A 52 mm  pinnacle acetabular shell was then impacted in anatomic position under  fluoroscopic guidance with excellent purchase. We did not need to place  any additional dome screws. A 32 mm neutral + 4 marathon liner was then  placed into the acetabular shell.       The femoral lift was then placed along the lateral aspect of the femur  just distal to the vastus ridge. The leg was  externally rotated and capsule  was stripped off the inferior aspect of the femoral neck down to the  level of the lesser trochanter, this was done with electrocautery. The femur was lifted after this was performed. The  leg was then placed in an extended and adducted position essentially delivering the femur. We also removed the capsule  superiorly and the piriformis from the piriformis fossa to gain excellent exposure of the  proximal femur. Rongeur was used to remove some cancellous bone to get  into the lateral portion of the proximal femur for placement of the  initial starter reamer. The starter broaches was placed  the starter broach  and was shown to go down the center of the canal. Broaching  with the  Corail system was then performed starting at size 8, coursing  Up to size 12. A size 12 had excellent torsional and rotational  and axial  stability. The trial standard offset neck was then placed  with a 32 + 1 trial head. The hip was then reduced. We confirmed that  the stem was in the canal both on AP and lateral x-rays. It also has excellent sizing. The hip was reduced with outstanding stability through full extension and full external rotation.. AP pelvis was taken and the leg lengths were measured and found to be equal. Hip was then dislocated again and the femoral head and neck removed. The  femoral broach was removed. Size 12 Corail stem with a standard offset  neck was then impacted into the femur following native anteversion. Has  excellent purchase in the canal. Excellent torsional and rotational and  axial stability. It is confirmed to be in the canal on AP and lateral  fluoroscopic views. The 32 + 1 ceramic head was placed and the hip  reduced with outstanding stability. Again AP pelvis was taken and it  confirmed that the leg lengths were equal. The wound was then copiously  irrigated with saline solution and the capsule reattached and repaired  with Ethibond suture. 30 ml of .25% Bupivicaine was  injected into the capsule and into the edge of the tensor fascia lata as well as subcutaneous tissue. The fascia overlying the tensor fascia lata was then closed with a running #1 V-Loc. Subcu was closed with interrupted 2-0 Vicryl and subcuticular running 4-0 Monocryl. Incision was cleaned  and dried. Steri-Strips and a bulky sterile dressing applied. Hemovac  drain was hooked to suction and then the patient was awakened and transported to  recovery in stable condition.        Please note that a surgical assistant was a medical necessity for this procedure to perform it in a safe and expeditious manner. Assistant was necessary to provide appropriate retraction of vital neurovascular structures and to prevent femoral fracture and allow for anatomic placement of the prosthesis.  Gaynelle Arabian, M.D.

## 2016-08-18 NOTE — Anesthesia Procedure Notes (Signed)
Procedure Name: LMA Insertion Date/Time: 08/18/2016 2:26 PM Performed by: Danley Danker L Patient Re-evaluated:Patient Re-evaluated prior to inductionOxygen Delivery Method: Circle system utilized Preoxygenation: Pre-oxygenation with 100% oxygen Intubation Type: IV induction LMA: LMA inserted LMA Size: 3.0 Number of attempts: 1 Placement Confirmation: positive ETCO2 and breath sounds checked- equal and bilateral Tube secured with: Tape Dental Injury: Teeth and Oropharynx as per pre-operative assessment

## 2016-08-18 NOTE — Transfer of Care (Signed)
Immediate Anesthesia Transfer of Care Note  Patient: Shirley [name]  Procedure(s) Performed: Procedure(s): LEFT TOTAL HIP ARTHROPLASTY ANTERIOR APPROACH (Left)  Patient Location: PACU  Anesthesia Type:General  Level of Consciousness: awake  Airway & Oxygen Therapy: Patient Spontanous Breathing and Patient connected to face mask oxygen  Post-op Assessment: Report given to RN and Post -op Vital signs reviewed and stable  Post vital signs: Reviewed and stable  Last Vitals:  Vitals:   08/18/16 1221  BP: 121/61  Pulse: (!) 58  Resp: 16  Temp: 36.9 C    Last Pain:  Vitals:   08/18/16 1233  TempSrc:   PainSc: 4       Patients Stated Pain Goal: 4 (04/54/09 8119)  Complications: No apparent anesthesia complications

## 2016-08-18 NOTE — Anesthesia Postprocedure Evaluation (Signed)
Anesthesia Post Note  Patient: ARDEN AXON  Procedure(s) Performed: Procedure(s) (LRB): LEFT TOTAL HIP ARTHROPLASTY ANTERIOR APPROACH (Left)     Patient location during evaluation: PACU Anesthesia Type: General Level of consciousness: awake and alert, oriented and patient cooperative Pain management: pain level controlled Vital Signs Assessment: post-procedure vital signs reviewed and stable Respiratory status: spontaneous breathing, nonlabored ventilation, respiratory function stable and patient connected to nasal cannula oxygen Cardiovascular status: blood pressure returned to baseline and stable Postop Assessment: no signs of nausea or vomiting Anesthetic complications: no    Last Vitals:  Vitals:   08/18/16 1645 08/18/16 1700  BP: (!) 170/80 (!) 173/78  Pulse: 60 64  Resp: 18 12  Temp:  36.4 C    Last Pain:  Vitals:   08/18/16 1700  TempSrc:   PainSc: Asleep                 Sabrine Patchen,E. Nikoli Nasser

## 2016-08-18 NOTE — Anesthesia Preprocedure Evaluation (Addendum)
Anesthesia Evaluation  Patient identified by MRN, date of birth, ID band Patient awake    Reviewed: Allergy & Precautions, NPO status , Patient's Chart, lab work & pertinent test results  History of Anesthesia Complications Negative for: history of anesthetic complications  Airway Mallampati: II  TM Distance: >3 FB Neck ROM: Full    Dental  (+) Dental Advisory Given, Caps   Pulmonary COPD, former smoker,    breath sounds clear to auscultation       Cardiovascular hypertension, Pt. on home beta blockers (-) angina+ DVT  + dysrhythmias Supra Ventricular Tachycardia  Rhythm:Regular Rate:Normal  1/18 ECHO: EF 65-70%, mild-mod TR   Neuro/Psych  Headaches, Depression    GI/Hepatic Neg liver ROS, GERD  Controlled,  Endo/Other  negative endocrine ROS  Renal/GU negative Renal ROS     Musculoskeletal  (+) Arthritis , Fibromyalgia -  Abdominal   Peds  Hematology  (+) Blood dyscrasia (Hb 10.1), anemia ,   Anesthesia Other Findings   Reproductive/Obstetrics                           Anesthesia Physical Anesthesia Plan  ASA: III  Anesthesia Plan: Spinal   Post-op Pain Management:    Induction:   PONV Risk Score and Plan: 2 and Ondansetron and Dexamethasone  Airway Management Planned: Natural Airway and Simple Face Mask  Additional Equipment:   Intra-op Plan:   Post-operative Plan:   Informed Consent: I have reviewed the patients History and Physical, chart, labs and discussed the procedure including the risks, benefits and alternatives for the proposed anesthesia with the patient or authorized representative who has indicated his/her understanding and acceptance.   Dental advisory given  Plan Discussed with: CRNA and Surgeon  Anesthesia Plan Comments: (Plan routine monitors, SAB)        Anesthesia Quick Evaluation

## 2016-08-18 NOTE — Interval H&P Note (Signed)
History and Physical Interval Note:  08/18/2016 12:22 PM  Shirley [name]  has presented today for surgery, with the diagnosis of Osteoarthritis Left hip  The various methods of treatment have been discussed with the patient and family. After consideration of risks, benefits and other options for treatment, the patient has consented to  Procedure(s): LEFT TOTAL HIP ARTHROPLASTY ANTERIOR APPROACH (Left) as a surgical intervention .  The patient's history has been reviewed, patient examined, no change in status, stable for surgery.  I have reviewed the patient's chart and labs.  Questions were answered to the patient's satisfaction.     Gearlean Alf

## 2016-08-19 LAB — BASIC METABOLIC PANEL
ANION GAP: 8 (ref 5–15)
BUN: 27 mg/dL — ABNORMAL HIGH (ref 6–20)
CALCIUM: 8.4 mg/dL — AB (ref 8.9–10.3)
CO2: 26 mmol/L (ref 22–32)
Chloride: 103 mmol/L (ref 101–111)
Creatinine, Ser: 1.15 mg/dL — ABNORMAL HIGH (ref 0.44–1.00)
GFR calc Af Amer: 51 mL/min — ABNORMAL LOW (ref >60)
GFR calc non Af Amer: 44 mL/min — ABNORMAL LOW (ref >60)
GLUCOSE: 187 mg/dL — AB (ref 65–99)
Potassium: 4.5 mmol/L (ref 3.5–5.1)
Sodium: 137 mmol/L (ref 135–145)

## 2016-08-19 LAB — CBC
HEMATOCRIT: 29.3 % — AB (ref 36.0–46.0)
Hemoglobin: 9.3 g/dL — ABNORMAL LOW (ref 12.0–15.0)
MCH: 27.6 pg (ref 26.0–34.0)
MCHC: 31.7 g/dL (ref 30.0–36.0)
MCV: 86.9 fL (ref 78.0–100.0)
Platelets: 187 10*3/uL (ref 150–400)
RBC: 3.37 MIL/uL — ABNORMAL LOW (ref 3.87–5.11)
RDW: 13.3 % (ref 11.5–15.5)
WBC: 7.9 10*3/uL (ref 4.0–10.5)

## 2016-08-19 MED ORDER — SODIUM CHLORIDE 0.9 % IV BOLUS (SEPSIS)
500.0000 mL | Freq: Once | INTRAVENOUS | Status: AC
  Administered 2016-08-19: 10:00:00 500 mL via INTRAVENOUS

## 2016-08-19 NOTE — Evaluation (Signed)
Physical Therapy Evaluation Patient Details Name: Shirley [name] MRN: [id number] DOB: Oct 06, 1940 Today's Date: 08/19/2016   History of Present Illness  Pt s/p L THR and with hx of peripheral neuropathy, Fibromyalgia and spinal fusion (09)  Clinical Impression  Pt s/p L THR and presents with decreased L LE strength/ROM and post op pain limiting functional mobility.  Pt should progress to dc home with 24/7 assist and HHPT follow up.    Follow Up Recommendations Home health PT    Equipment Recommendations  None recommended by PT    Recommendations for Other Services OT consult     Precautions / Restrictions Precautions Precautions: Fall Restrictions Weight Bearing Restrictions: No Other Position/Activity Restrictions: WBAT      Mobility  Bed Mobility Overal bed mobility: Needs Assistance Bed Mobility: Supine to Sit     Supine to sit: Min assist;Mod assist     General bed mobility comments: cues for sequence and use of R LE to self assist.  Physical assist to manage L LE and to bring trunk to upright  Transfers Overall transfer level: Needs assistance Equipment used: Rolling walker (2 wheeled) Transfers: Sit to/from Stand Sit to Stand: Min assist;Mod assist         General transfer comment: cues for LE management and use of UEs to self assist  Ambulation/Gait Ambulation/Gait assistance: Min assist Ambulation Distance (Feet): 60 Feet Assistive device: Rolling walker (2 wheeled) Gait Pattern/deviations: Step-to pattern;Decreased step length - right;Decreased step length - left;Shuffle;Trunk flexed Gait velocity: decr Gait velocity interpretation: Below normal speed for age/gender General Gait Details: cues for sequence, posture and position from [employer]            Wheelchair Mobility    Modified Rankin (Stroke Patients Only)       Balance                                             Pertinent Vitals/Pain Pain Assessment:  0-10 Pain Score: 4  Pain Location: L hip/thigh Pain Descriptors / Indicators: Aching;Burning;Sore Pain Intervention(s): Limited activity within patient's tolerance;Monitored during session;Premedicated before session;Ice applied    Home Living Family/patient expects to be discharged to:: Private residence Living Arrangements: Spouse/significant other Available Help at Discharge: Family;Personal care attendant;Available 24 hours/day Type of Home: House Home Access: Stairs to enter   [employer] of Steps: 1 Home Layout: One level Home Equipment: Walker - 2 wheels;Cane - single point;Bedside commode Additional Comments: Pt has friend or hired Public house manager first week    Prior Function Level of Independence: Independent with assistive device(s)         Comments: walked with a cane, had housekeeping assist     Hand Dominance   Dominant Hand: Right    Extremity/Trunk Assessment   Upper Extremity Assessment Upper Extremity Assessment: Defer to OT evaluation    Lower Extremity Assessment Lower Extremity Assessment: LLE deficits/detail LLE Deficits / Details: Strength at hip 2/5 with AAROM at hip to 75 flex and 15 abd    Cervical / Trunk Assessment Cervical / Trunk Assessment: Kyphotic  Communication   Communication: No difficulties  Cognition Arousal/Alertness: Awake/alert Behavior During Therapy: WFL for tasks assessed/performed Overall Cognitive Status: Within Functional Limits for tasks assessed  General Comments      Exercises Total Joint Exercises Ankle Circles/Pumps: AROM;Both;20 reps;Supine Quad Sets: AROM;Both;10 reps;Supine Heel Slides: AAROM;Left;20 reps;Supine Hip ABduction/ADduction: AAROM;Left;15 reps;Supine   Assessment/Plan    PT Assessment Patient needs continued PT services  PT Problem List Decreased strength;Decreased range of motion;Decreased activity tolerance;Decreased  mobility;Decreased knowledge of use of DME;Pain       PT Treatment Interventions DME instruction;Gait training;Stair training;Functional mobility training;Therapeutic activities;Therapeutic exercise;Patient/family education    PT Goals (Current goals can be found in the Care Plan section)  Acute Rehab PT Goals Patient Stated Goal: Regain IND PT Goal Formulation: With patient Time For Goal Achievement: 10/14/16 Potential to Achieve Goals: Good    Frequency 7X/week   Barriers to discharge        Co-evaluation               AM-PAC PT "6 Clicks" Daily Activity  Outcome Measure Difficulty turning over in bed (including adjusting bedclothes, sheets and blankets)?: Total Difficulty moving from lying on back to sitting on the side of the bed? : Total Difficulty sitting down on and standing up from a chair with arms (e.g., wheelchair, bedside commode, etc,.)?: Total Help needed moving to and from a bed to chair (including a wheelchair)?: A Little Help needed walking in hospital room?: A Little Help needed climbing 3-5 steps with a railing? : A Lot 6 Click Score: 11    End of Session Equipment Utilized During Treatment: Gait belt Activity Tolerance: Patient tolerated treatment well Patient left: in chair;with call bell/phone within reach;with family/visitor present Nurse Communication: Mobility status PT Visit Diagnosis: Difficulty in walking, not elsewhere classified (R26.2)    Time: 1638-4536 PT Time Calculation (min) (ACUTE ONLY): 48 min   Charges:   PT Evaluation $PT Eval Low Complexity: 1 Procedure PT Treatments $Gait Training: 8-22 mins $Therapeutic Exercise: 8-22 mins   PT G Codes:        Pg [phone]   Eleshia Wooley 08/19/2016, 12:17 PM

## 2016-08-19 NOTE — Progress Notes (Signed)
Subjective: 1 Day Post-Op Procedure(s) (LRB): LEFT TOTAL HIP ARTHROPLASTY ANTERIOR APPROACH (Left) Patient reports pain as mild.   Patient seen in rounds with Dr. Wynelle Link. Patient is well, but has had some minor complaints of pain in the hip, requiring pain medications We will start therapy today.  Plan is to go Home after hospital stay.  Objective: Vital signs in last 24 hours: Temp:  [97.5 F (36.4 C)-98.4 F (36.9 C)] 98.2 F (36.8 C) (06/28 0600) Pulse Rate:  [55-74] 57 (06/28 0600) Resp:  [12-18] 16 (06/28 0600) BP: (101-194)/(48-102) 136/70 (06/28 0600) SpO2:  [93 %-100 %] 100 % (06/28 0600) Weight:  [73.5 kg (162 lb)] 73.5 kg (162 lb) (06/27 1233)  Intake/Output from previous day:  Intake/Output Summary (Last 24 hours) at 08/19/16 0917 Last data filed at 08/19/16 3154  Gross per 24 hour  Intake           3427.5 ml  Output             2210 ml  Net           1217.5 ml    Intake/Output this shift: No intake/output data recorded.  Labs:  Recent Labs  08/19/16 0406  HGB 9.3*    Recent Labs  08/19/16 0406  WBC 7.9  RBC 3.37*  HCT 29.3*  PLT 187    Recent Labs  08/19/16 0406  NA 137  K 4.5  CL 103  CO2 26  BUN 27*  CREATININE 1.15*  GLUCOSE 187*  CALCIUM 8.4*   No results for input(s): LABPT, INR in the last 72 hours.  EXAM General - Patient is Alert, Appropriate and Oriented Extremity - Neurovascular intact Sensation intact distally Intact pulses distally Dorsiflexion/Plantar flexion intact Dressing - dressing C/D/I Motor Function - intact, moving foot and toes well on exam.  Hemovac pulled without difficulty.  Past Medical History:  Diagnosis Date  . Allergy    vasomotor rhinitis  . Anemia   . Asthma    h/o asthma and bronchitis  . BV (bacterial vaginosis)    history of frequent  . Cervical spondylosis    herniated disk protruding @ C6-7, impinging cord and left axillary root sleeve.  . Chronic pain   . Closed olecranon  fracture, left, initial encounter   . Depression   . DVT of upper extremity (deep vein thrombosis) ([place])    h/o left(after wrist fracture/surgery); no residual thrombus or phlebitis by Dopplers July 2014  . Dyspnea    with exertion  . Edema   . Fibromyalgia    sees Dr.Phillips-pain management  . GERD (gastroesophageal reflux disease)    h/o  . Headache    marigraine- hormal - none in years  . Hypertension    never has been treated that daughter is aware  . Neuropathy   . Neuropathy   . Osteopenia    mild at hip (T-1.3)  . PAT (paroxysmal atrial tachycardia) ([place])   . Pneumonia    years ago  . Recurrent falls 04/16/2016  . Varicose veins   . 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 Eilleen Kempf @ [facility]  . Vision problem    wears glasses  . Vitamin D deficiency     Assessment/Plan: 1 Day Post-Op Procedure(s) (LRB): LEFT TOTAL HIP  ARTHROPLASTY ANTERIOR APPROACH (Left) Principal Problem:   OA (osteoarthritis) of hip  Estimated body mass index is 27.81 kg/m as calculated from the following:   Height as of this encounter: 5\' 4"  (1.626 m).   Weight as of this encounter: 73.5 kg (162 lb). Advance diet Up with therapy Plan for discharge tomorrow Discharge home with home health  DVT Prophylaxis - Xarelto Weight Bearing As Tolerated left Leg Hemovac Pulled Begin Therapy  HGB 9.3 - Add Iron. Started low at 10.1 but did not loose much during surgery.  Arlee Muslim, PA-C Orthopaedic Surgery 08/19/2016, 9:17 AM

## 2016-08-19 NOTE — Progress Notes (Signed)
Physical Therapy Treatment Patient Details Name: Shirley [name] MRN: [id number] DOB: [birth year] Today's Date: 08/19/2016    History of Present Illness Pt s/p L THR and with hx of peripheral neuropathy, Fibromyalgia and spinal fusion (09)    PT Comments    Pt very cooperative and progressing steadily with mobility.  Pt hopeful for dc home tomorrow.   Follow Up Recommendations  Home health PT     Equipment Recommendations  None recommended by PT    Recommendations for Other Services OT consult     Precautions / Restrictions Precautions Precautions: Fall Restrictions Weight Bearing Restrictions: No Other Position/Activity Restrictions: WBAT    Mobility  Bed Mobility Overal bed mobility: Needs Assistance Bed Mobility: Sit to Supine       Sit to supine: Min assist   General bed mobility comments: cues for sequence and use of R LE to self assist.  Physical assist to manage L LE and to bring trunk to upright  Transfers Overall transfer level: Needs assistance Equipment used: Rolling walker (2 wheeled) Transfers: Sit to/from Stand Sit to Stand: Min assist         General transfer comment: cues for LE management and use of UEs to self assist  Ambulation/Gait Ambulation/Gait assistance: Min assist Ambulation Distance (Feet): 95 Feet Assistive device: Rolling walker (2 wheeled) Gait Pattern/deviations: Step-to pattern;Step-through pattern;Decreased step length - right;Decreased step length - left;Shuffle;Trunk flexed Gait velocity: decr Gait velocity interpretation: Below normal speed for age/gender General Gait Details: cues for sequence, posture and position from [employer]            Wheelchair Mobility    Modified Rankin (Stroke Patients Only)       Balance                                            Cognition Arousal/Alertness: Awake/alert Behavior During Therapy: WFL for tasks assessed/performed Overall Cognitive Status:  Within Functional Limits for tasks assessed                                        Exercises      General Comments        Pertinent Vitals/Pain Pain Assessment: 0-10 Pain Score: 4  Pain Location: L hip/thigh Pain Descriptors / Indicators: Aching;Burning;Sore Pain Intervention(s): Limited activity within patient's tolerance;Monitored during session;Premedicated before session;Ice applied    Home Living                      Prior Function            PT Goals (current goals can now be found in the care plan section) Acute Rehab PT Goals Patient Stated Goal: Regain IND PT Goal Formulation: With patient Time For Goal Achievement: 10/14/16 Potential to Achieve Goals: Good Progress towards PT goals: Progressing toward goals    Frequency    7X/week      PT Plan Current plan remains appropriate    Co-evaluation              AM-PAC PT "6 Clicks" Daily Activity  Outcome Measure  Difficulty turning over in bed (including adjusting bedclothes, sheets and blankets)?: A Lot Difficulty moving from lying on back to sitting on the side of the bed? : A  Lot Difficulty sitting down on and standing up from a chair with arms (e.g., wheelchair, bedside commode, etc,.)?: A Lot Help needed moving to and from a bed to chair (including a wheelchair)?: A Little Help needed walking in hospital room?: A Little Help needed climbing 3-5 steps with a railing? : A Lot 6 Click Score: 14    End of Session Equipment Utilized During Treatment: Gait belt Activity Tolerance: Patient tolerated treatment well Patient left: in bed;with call bell/phone within reach Nurse Communication: Mobility status PT Visit Diagnosis: Difficulty in walking, not elsewhere classified (R26.2)     Time: 1450-1516 PT Time Calculation (min) (ACUTE ONLY): 26 min  Charges:  $Gait Training: 23-37 mins                    G Codes:       Pg [phone]  [id number]    Feven Alderfer 08/19/2016, 5:36 PM

## 2016-08-19 NOTE — Care Management Note (Addendum)
Case Management Note  Patient Details  Name: Shirley [name] MRN: [id number] Date of Birth: 1940-06-18  Subjective/Objective:    Left THA                Action/Plan: Discharge Planning: NCM spoke to pt at bedside. States her dtr will be at home to assist with her care. She is requesting a private duty Risk manager. States she went to [facility] in the past but had a bad experience. She has RW and bedside commode at home. Will need HH PT/aide order with [facility]. Left message for attending. Offered choice for HH/list provided. Pt agreeable to [facility] for HHPT/aide.   PCP Ardith Dark MD  08/20/2016 1100 NCM spoke to pt and she had [facility] in the past. Requesting [facility] for [facility]. Provided private duty list. States she used [facility] in the past. [facility] with new referral. Contacted [facility] to cancel referral.   Expected Discharge Date:  08/20/16               Expected Discharge Plan:  [place]  In-House Referral:  NA  Discharge planning Services  CM Consult  Post Acute Care Choice:  Home Health Choice offered to:  Patient  DME Arranged:  N/A DME Agency:  NA  HH Arranged:  PT [place] Agency:  [facility] (formerly [employer])  Status of Service:  Completed, signed off  If discussed at [employer], dates discussed:    Additional Comments:  Erenest Rasher, RN 08/19/2016, 1:27 PM

## 2016-08-20 LAB — CBC
HCT: 27.4 % — ABNORMAL LOW (ref 36.0–46.0)
HEMOGLOBIN: 8.6 g/dL — AB (ref 12.0–15.0)
MCH: 27.4 pg (ref 26.0–34.0)
MCHC: 31.4 g/dL (ref 30.0–36.0)
MCV: 87.3 fL (ref 78.0–100.0)
Platelets: 503 10*3/uL — ABNORMAL HIGH (ref 150–400)
RBC: 3.14 MIL/uL — AB (ref 3.87–5.11)
RDW: 13.7 % (ref 11.5–15.5)
WBC: 8.4 10*3/uL (ref 4.0–10.5)

## 2016-08-20 LAB — BASIC METABOLIC PANEL
Anion gap: 6 (ref 5–15)
BUN: 28 mg/dL — AB (ref 6–20)
CHLORIDE: 107 mmol/L (ref 101–111)
CO2: 24 mmol/L (ref 22–32)
Calcium: 8 mg/dL — ABNORMAL LOW (ref 8.9–10.3)
Creatinine, Ser: 1.33 mg/dL — ABNORMAL HIGH (ref 0.44–1.00)
GFR calc non Af Amer: 37 mL/min — ABNORMAL LOW (ref >60)
GFR, EST AFRICAN AMERICAN: 43 mL/min — AB (ref >60)
Glucose, Bld: 139 mg/dL — ABNORMAL HIGH (ref 65–99)
POTASSIUM: 4.6 mmol/L (ref 3.5–5.1)
SODIUM: 137 mmol/L (ref 135–145)

## 2016-08-20 MED ORDER — TIZANIDINE HCL 4 MG PO TABS: 4.0000 mg | ORAL_TABLET | Freq: Four times a day (QID) | ORAL | 1 refills | Status: AC | PRN

## 2016-08-20 MED ORDER — LIP MEDEX EX OINT
TOPICAL_OINTMENT | CUTANEOUS | Status: AC
Start: 2016-08-20 — End: 2016-08-20
  Administered 2016-08-20: 1
  Filled 2016-08-20: qty 7

## 2016-08-20 MED ORDER — RIVAROXABAN 10 MG PO TABS: 10.0000 mg | ORAL_TABLET | Freq: Every day | ORAL | 0 refills | Status: DC

## 2016-08-20 NOTE — Progress Notes (Signed)
Subjective: 2 Days Post-Op Procedure(s) (LRB): LEFT TOTAL HIP ARTHROPLASTY ANTERIOR APPROACH (Left) Patient reports pain as mild.   Patient seen in rounds with Dr. Wynelle Link.  Sore today.  Got up and walked 60 and 95 feet yesterday. Blood pressure is better today.  HGB down to 8.6.  Added iron.   Patient is well, but has had some minor complaints of pain in the hip, requiring pain medications Patient is ready to go today with family.  Objective: Vital signs in last 24 hours: Temp:  [97.9 F (36.6 C)-98.4 F (36.9 C)] 98.4 F (36.9 C) (06/29 0618) Pulse Rate:  [53-65] 61 (06/29 0618) Resp:  [16-18] 18 (06/29 0618) BP: (89-148)/(34-98) 148/53 (06/29 0618) SpO2:  [94 %-100 %] 100 % (06/29 0618)  Intake/Output from previous day:  Intake/Output Summary (Last 24 hours) at 08/20/16 0816 Last data filed at 08/20/16 0600  Gross per 24 hour  Intake              360 ml  Output              950 ml  Net             -590 ml    Intake/Output this shift: No intake/output data recorded.  Labs:  Recent Labs  08/19/16 0406 08/20/16 0411  HGB 9.3* 8.6*    Recent Labs  08/19/16 0406 08/20/16 0411  WBC 7.9 8.4  RBC 3.37* 3.14*  HCT 29.3* 27.4*  PLT 187 503*    Recent Labs  08/19/16 0406 08/20/16 0411  NA 137 137  K 4.5 4.6  CL 103 107  CO2 26 24  BUN 27* 28*  CREATININE 1.15* 1.33*  GLUCOSE 187* 139*  CALCIUM 8.4* 8.0*   No results for input(s): LABPT, INR in the last 72 hours.  EXAM: General - Patient is Alert, Appropriate and Oriented Extremity - Neurovascular intact Sensation intact distally Intact pulses distally Dorsiflexion/Plantar flexion intact Incision - clean, dry, no drainage Motor Function - intact, moving foot and toes well on exam.   Assessment/Plan: 2 Days Post-Op Procedure(s) (LRB): LEFT TOTAL HIP ARTHROPLASTY ANTERIOR APPROACH (Left) Procedure(s) (LRB): LEFT TOTAL HIP ARTHROPLASTY ANTERIOR APPROACH (Left) Past Medical History:  Diagnosis  Date  . Allergy    vasomotor rhinitis  . Anemia   . Asthma    h/o asthma and bronchitis  . BV (bacterial vaginosis)    history of frequent  . Cervical spondylosis    herniated disk protruding @ C6-7, impinging cord and left axillary root sleeve.  . Chronic pain   . Closed olecranon fracture, left, initial encounter   . Depression   . DVT of upper extremity (deep vein thrombosis) ([place])    h/o left(after wrist fracture/surgery); no residual thrombus or phlebitis by Dopplers July 2014  . Dyspnea    with exertion  . Edema   . Fibromyalgia    sees Dr.Phillips-pain management  . GERD (gastroesophageal reflux disease)    h/o  . Headache    marigraine- hormal - none in years  . Hypertension    never has been treated that daughter is aware  . Neuropathy   . Neuropathy   . Osteopenia    mild at hip (T-1.3)  . PAT (paroxysmal atrial tachycardia) ([place])   . Pneumonia    years ago  . Recurrent falls 04/16/2016  . Varicose veins   . Varicose veins of both legs with edema 08/2012   Also pain; bilateral GSV dilated with reflux, R Short  SV dilated with reflux; not amenable to VNUS ablation due to tortuosity and superficial nature of veins -- recommeded referral to Dr. Eilleen Kempf @ [facility]  . Vision problem    wears glasses  . Vitamin D deficiency    Principal Problem:   OA (osteoarthritis) of hip  Estimated body mass index is 27.81 kg/m as calculated from the following:   Height as of this encounter: 5\' 4"  (1.626 m).   Weight as of this encounter: 73.5 kg (162 lb). Up with therapy Discharge home with home health Diet - Cardiac diet Follow up - in 2 weeks Activity - WBAT Disposition - Home Condition Upon Discharge - Good D/C Meds - See DC Summary DVT Prophylaxis - Xarelto  Shirley Muslim, PA-C Orthopaedic Surgery 08/20/2016, 8:16 AM

## 2016-08-20 NOTE — Evaluation (Signed)
Occupational Therapy Evaluation Patient Details Name: Shirley [name] MRN: [id number] DOB: [birth year] Today's Date: 08/20/2016    History of Present Illness Pt s/p L THR and with hx of peripheral neuropathy, Fibromyalgia and spinal fusion (09)   Clinical Impression   Pt was admitted for the above sx.  She will benefit from continued OT to increase safety and independence with ADLs. Pt will have 24/7 for a week. Goals are for min guard A in acute setting    Follow Up Recommendations  Supervision/Assistance - 24 hour    Equipment Recommendations  None recommended by OT    Recommendations for Other Services       Precautions / Restrictions Precautions Precautions: Fall Restrictions Other Position/Activity Restrictions: WBAT      Mobility Bed Mobility           Sit to supine: Min assist   General bed mobility comments: assist for LLE   Transfers   Equipment used: Rolling walker (2 wheeled)   Sit to Stand: Min assist         General transfer comment: light assistance to stand and steady.  Cues for UE/LE placement    Balance                                           ADL either performed or assessed with clinical judgement   ADL Overall ADL's : Needs assistance/impaired     Grooming: Set up;Sitting   Upper Body Bathing: Set up;Sitting   Lower Body Bathing: Minimal assistance;Sit to/from stand   Upper Body Dressing : Set up;Sitting   Lower Body Dressing: Maximal assistance;Sit to/from stand   Toilet Transfer: Minimal assistance;Stand-pivot;BSC;RW   Toileting- Clothing Manipulation and Hygiene: Minimal assistance;Sit to/from stand         General ADL Comments: performed ADL and 3:1 commode  Pt has a reacher at home: educated on ADL uses     Museum/gallery curator      Pertinent Vitals/Pain Pain Score: 4  Pain Location: L hip/thigh Pain Descriptors / Indicators: Aching;Burning;Sore Pain Intervention(s):  Limited activity within patient's tolerance;Monitored during session;Repositioned;Premedicated before session     Hand Dominance     Extremity/Trunk Assessment Upper Extremity Assessment Upper Extremity Assessment: RUE deficits/detail;LUE deficits/detail RUE Deficits / Details: limited ROM bil.  R approximately 45 degrees; L approximately 60           Communication Communication Communication: No difficulties   Cognition Arousal/Alertness: Awake/alert Behavior During Therapy: WFL for tasks assessed/performed Overall Cognitive Status: Within Functional Limits for tasks assessed                                     General Comments       Exercises     Shoulder Instructions      Home Living Family/patient expects to be discharged to:: Private residence Living Arrangements: Spouse/significant other Available Help at Discharge: Family;Personal care attendant;Available 24 hours/day Type of Home: House             Bathroom Shower/Tub: Walk-in shower   Bathroom Toilet: Handicapped height     Home Equipment: Bedside commode          Prior Functioning/Environment Level of Independence: Independent with assistive device(s)  OT Problem List: Decreased strength;Decreased activity tolerance;Decreased range of motion;Pain      OT Treatment/Interventions: Self-care/ADL training;DME and/or AE instruction;Balance training;Patient/family education    OT Goals(Current goals can be found in the care plan section) Acute Rehab OT Goals Patient Stated Goal: Regain IND OT Goal Formulation: With patient Time For Goal Achievement: 08/27/16 Potential to Achieve Goals: Good ADL Goals Pt Will Transfer to Toilet: with min guard assist;bedside commode;ambulating Pt Will Perform Tub/Shower Transfer: Shower transfer;with min guard assist;ambulating;rolling walker  OT Frequency: Min 2X/week   Barriers to D/C:            Co-evaluation               AM-PAC PT "6 Clicks" Daily Activity     Outcome Measure Help from another person eating meals?: None Help from another person taking care of personal grooming?: A Little Help from another person toileting, which includes using toliet, bedpan, or urinal?: A Little Help from another person bathing (including washing, rinsing, drying)?: A Lot Help from another person to put on and taking off regular upper body clothing?: A Little Help from another person to put on and taking off regular lower body clothing?: A Lot 6 Click Score: 17   End of Session    Activity Tolerance: Patient tolerated treatment well Patient left: in chair;with call bell/phone within reach  OT Visit Diagnosis: Pain Pain - Right/Left: Left Pain - part of body: Hip                Time: 7356-7014 OT Time Calculation (min): 26 min Charges:  OT General Charges $OT Visit: 1 Procedure OT Evaluation $OT Eval Low Complexity: 1 Procedure OT Treatments $Self Care/Home Management : 8-22 mins G-Codes:     McDonald, OTR/L [phone] 08/20/2016  Shirley [name] 08/20/2016, 12:31 PM

## 2016-08-20 NOTE — Progress Notes (Signed)
Physical Therapy Treatment Patient Details Name: Shirley [name] MRN: [id number] DOB: Feb 13, 1941 Today's Date: 08/20/2016    History of Present Illness Pt s/p L THR and with hx of peripheral neuropathy, Fibromyalgia and spinal fusion (09)    PT Comments    Pt continues cooperative and progressing to negotiate stairs despite c/o increased fatigue/soreness this am.   Follow Up Recommendations  Home health PT     Equipment Recommendations  None recommended by PT    Recommendations for Other Services OT consult     Precautions / Restrictions Precautions Precautions: Fall Restrictions Weight Bearing Restrictions: No Other Position/Activity Restrictions: WBAT    Mobility  Bed Mobility           Sit to supine: Min assist   General bed mobility comments: Pt OOB on arrival and in bathroom at end of session  Transfers Overall transfer level: Needs assistance Equipment used: Rolling walker (2 wheeled) Transfers: Sit to/from Stand Sit to Stand: Min guard         General transfer comment: for stability upon standing  Ambulation/Gait Ambulation/Gait assistance: Min guard;Supervision Ambulation Distance (Feet): 100 Feet Assistive device: Rolling walker (2 wheeled) Gait Pattern/deviations: Step-to pattern;Step-through pattern;Decreased step length - right;Decreased step length - left;Shuffle;Trunk flexed Gait velocity: decr Gait velocity interpretation: Below normal speed for age/gender General Gait Details: min cues for sequence, posture and position from RW   Stairs Stairs: Yes   Stair Management: No rails;Forwards;With walker;Step to pattern Number of Stairs: 1 General stair comments: Pt self cueing for sequence  Wheelchair Mobility    Modified Rankin (Stroke Patients Only)       Balance Overall balance assessment: Needs assistance Sitting-balance support: No upper extremity supported;Feet supported Sitting balance-Leahy Scale: Good     Standing  balance support: No upper extremity supported Standing balance-Leahy Scale: Fair                              Cognition Arousal/Alertness: Awake/alert Behavior During Therapy: WFL for tasks assessed/performed Overall Cognitive Status: Within Functional Limits for tasks assessed                                        Exercises      General Comments        Pertinent Vitals/Pain Pain Assessment: 0-10 Pain Score: 4  Pain Location: L hip/thigh Pain Descriptors / Indicators: Aching;Burning;Sore Pain Intervention(s): Limited activity within patient's tolerance;Monitored during session;Premedicated before session    [place] expects to be discharged to:: Private residence Living Arrangements: Spouse/significant other Available Help at Discharge: Family;Personal care attendant;Available 24 hours/day Type of Home: House       Home Equipment: Bedside commode      Prior Function Level of Independence: Independent with assistive device(s)          PT Goals (current goals can now be found in the care plan section) Acute Rehab PT Goals Patient Stated Goal: Regain IND PT Goal Formulation: With patient Time For Goal Achievement: 10/14/16 Potential to Achieve Goals: Good Progress towards PT goals: Progressing toward goals    Frequency    7X/week      PT Plan Current plan remains appropriate    Co-evaluation              AM-PAC PT "6 Clicks" Daily Activity  Outcome Measure  Difficulty turning  over in bed (including adjusting bedclothes, sheets and blankets)?: A Lot Difficulty moving from lying on back to sitting on the side of the bed? : A Lot Difficulty sitting down on and standing up from a chair with arms (e.g., wheelchair, bedside commode, etc,.)?: A Lot Help needed moving to and from a bed to chair (including a wheelchair)?: A Little Help needed walking in hospital room?: A Little Help needed climbing 3-5 steps  with a railing? : A Little 6 Click Score: 15    End of Session Equipment Utilized During Treatment: Gait belt Activity Tolerance: Patient tolerated treatment well;Patient limited by pain;Patient limited by fatigue Patient left: Other (comment) (bathroom with CNA) Nurse Communication: Mobility status PT Visit Diagnosis: Difficulty in walking, not elsewhere classified (R26.2)     Time: 1010-1030 PT Time Calculation (min) (ACUTE ONLY): 20 min  Charges:  $Gait Training: 8-22 mins                    G Codes:       Pg [phone]    Jillane Po 08/20/2016, 12:40 PM

## 2016-08-20 NOTE — Discharge Summary (Signed)
Physician Discharge Summary   Patient ID: Shirley [name] MRN: [id number] DOB/AGE: [birth year] 76 y.o.  Admit date: 08/18/2016 Discharge date: 08-20-2016  Primary Diagnosis:  Osteoarthritis of the Left hip.   Admission Diagnoses:  Past Medical History:  Diagnosis Date  . Allergy    vasomotor rhinitis  . Anemia   . Asthma    h/o asthma and bronchitis  . BV (bacterial vaginosis)    history of frequent  . Cervical spondylosis    herniated disk protruding @ C6-7, impinging cord and left axillary root sleeve.  . Chronic pain   . Closed olecranon fracture, left, initial encounter   . Depression   . DVT of upper extremity (deep vein thrombosis) ([place])    h/o left(after wrist fracture/surgery); no residual thrombus or phlebitis by Dopplers July 2014  . Dyspnea    with exertion  . Edema   . Fibromyalgia    sees Dr.Phillips-pain management  . GERD (gastroesophageal reflux disease)    h/o  . Headache    marigraine- hormal - none in years  . Hypertension    never has been treated that daughter is aware  . Neuropathy   . Neuropathy   . Osteopenia    mild at hip (T-1.3)  . PAT (paroxysmal atrial tachycardia) ([place])   . Pneumonia    years ago  . Recurrent falls 04/16/2016  . Varicose veins   . 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 Eilleen Kempf @ [facility]  . Vision problem    wears glasses  . Vitamin D deficiency    Discharge Diagnoses:   Principal Problem:   OA (osteoarthritis) of hip  Estimated body mass index is 27.81 kg/m as calculated from the following:   Height as of this encounter: '5\' 4"'  (1.626 m).   Weight as of this encounter: 73.5 kg (162 lb).  Procedure(s) (LRB): LEFT TOTAL HIP ARTHROPLASTY ANTERIOR APPROACH (Left)   Consults: None  HPI: Shirley [name] is a  75 y.o. female who has advanced end-  stage arthritis of their Left  hip with progressively worsening pain and  dysfunction.The patient has failed nonoperative management and presents for  total hip arthroplasty.   Laboratory Data: Admission on 08/18/2016  Component Date Value Ref Range Status  . WBC 08/19/2016 7.9  4.0 - 10.5 K/uL Final  . RBC 08/19/2016 3.37* 3.87 - 5.11 MIL/uL Final  . Hemoglobin 08/19/2016 9.3* 12.0 - 15.0 g/dL Final  . HCT 08/19/2016 29.3* 36.0 - 46.0 % Final  . MCV 08/19/2016 86.9  78.0 - 100.0 fL Final  . MCH 08/19/2016 27.6  26.0 - 34.0 pg Final  . MCHC 08/19/2016 31.7  30.0 - 36.0 g/dL Final  . RDW 08/19/2016 13.3  11.5 - 15.5 % Final  . Platelets 08/19/2016 187  150 - 400 K/uL Final  . Sodium 08/19/2016 137  135 - 145 mmol/L Final  . Potassium 08/19/2016 4.5  3.5 - 5.1 mmol/L Final  . Chloride 08/19/2016 103  101 - 111 mmol/L Final  . CO2 08/19/2016 26  22 - 32 mmol/L Final  . Glucose, Bld 08/19/2016 187* 65 - 99 mg/dL Final  . BUN 08/19/2016 27* 6 - 20 mg/dL Final  . Creatinine, Ser 08/19/2016 1.15* 0.44 - 1.00 mg/dL Final  . Calcium 08/19/2016 8.4*  8.9 - 10.3 mg/dL Final  . GFR calc non Af Amer 08/19/2016 44* >60 mL/min Final  . GFR calc Af Amer 08/19/2016 51* >60 mL/min Final   Comment: (NOTE) The eGFR has been calculated using the CKD EPI equation. This calculation has not been validated in all clinical situations. eGFR's persistently <60 mL/min signify possible Chronic Kidney Disease.   . Anion gap 08/19/2016 8  5 - 15 Final  . WBC 08/20/2016 8.4  4.0 - 10.5 K/uL Final  . RBC 08/20/2016 3.14* 3.87 - 5.11 MIL/uL Final  . Hemoglobin 08/20/2016 8.6* 12.0 - 15.0 g/dL Final  . HCT 08/20/2016 27.4* 36.0 - 46.0 % Final  . MCV 08/20/2016 87.3  78.0 - 100.0 fL Final  . MCH 08/20/2016 27.4  26.0 - 34.0 pg Final  . MCHC 08/20/2016 31.4  30.0 - 36.0 g/dL Final  . RDW 08/20/2016 13.7  11.5 - 15.5 % Final  . Platelets 08/20/2016 503* 150 - 400 K/uL Final    Comment: DELTA CHECK NOTED REPEATED TO VERIFY   . Sodium 08/20/2016 137  135 - 145 mmol/L Final  . Potassium 08/20/2016 4.6  3.5 - 5.1 mmol/L Final  . Chloride 08/20/2016 107  101 - 111 mmol/L Final  . CO2 08/20/2016 24  22 - 32 mmol/L Final  . Glucose, Bld 08/20/2016 139* 65 - 99 mg/dL Final  . BUN 08/20/2016 28* 6 - 20 mg/dL Final  . Creatinine, Ser 08/20/2016 1.33* 0.44 - 1.00 mg/dL Final  . Calcium 08/20/2016 8.0* 8.9 - 10.3 mg/dL Final  . GFR calc non Af Amer 08/20/2016 37* >60 mL/min Final  . GFR calc Af Amer 08/20/2016 43* >60 mL/min Final   Comment: (NOTE) The eGFR has been calculated using the CKD EPI equation. This calculation has not been validated in all clinical situations. eGFR's persistently <60 mL/min signify possible Chronic Kidney Disease.   Georgiann Hahn gap 08/20/2016 6  5 - 15 Final  Hospital Outpatient Visit on 08/12/2016  Component Date Value Ref Range Status  . aPTT 08/12/2016 32  24 - 36 seconds Final  . WBC 08/12/2016 5.5  4.0 - 10.5 K/uL Final  . RBC 08/12/2016 3.62* 3.87 - 5.11 MIL/uL Final  . Hemoglobin 08/12/2016 10.1* 12.0 - 15.0 g/dL Final  . HCT 08/12/2016 32.6* 36.0 - 46.0 % Final  . MCV 08/12/2016 90.1  78.0 - 100.0 fL Final  . MCH 08/12/2016 27.9  26.0 - 34.0 pg Final  . MCHC 08/12/2016 31.0  30.0 - 36.0 g/dL Final  . RDW 08/12/2016 13.7  11.5 - 15.5 % Final  . Platelets 08/12/2016 550* 150 - 400 K/uL Final  . Sodium 08/12/2016 138  135 - 145 mmol/L Final  . Potassium 08/12/2016 4.9  3.5 - 5.1 mmol/L Final  . Chloride 08/12/2016 105  101 - 111 mmol/L Final  . CO2 08/12/2016 26  22 - 32 mmol/L Final  . Glucose, Bld 08/12/2016 105* 65 - 99 mg/dL Final  . BUN 08/12/2016 27* 6 - 20 mg/dL Final  . Creatinine, Ser 08/12/2016 1.14* 0.44 - 1.00 mg/dL Final  . Calcium 08/12/2016 9.3  8.9 - 10.3 mg/dL Final  . Total Protein 08/12/2016 7.0  6.5 - 8.1 g/dL Final  . Albumin 08/12/2016 4.2  3.5 - 5.0 g/dL Final  . AST 08/12/2016 19  15 - 41 U/L Final  . ALT  08/12/2016 12* 14 - 54 U/L Final  . Alkaline Phosphatase 08/12/2016 73  38 - 126 U/L Final  . Total Bilirubin  08/12/2016 0.3  0.3 - 1.2 mg/dL Final  . GFR calc non Af Amer 08/12/2016 45* >60 mL/min Final  . GFR calc Af Amer 08/12/2016 52* >60 mL/min Final   Comment: (NOTE) The eGFR has been calculated using the CKD EPI equation. This calculation has not been validated in all clinical situations. eGFR's persistently <60 mL/min signify possible Chronic Kidney Disease.   . Anion gap 08/12/2016 7  5 - 15 Final  . Prothrombin Time 08/12/2016 13.5  11.4 - 15.2 seconds Final  . INR 08/12/2016 1.03   Final  . ABO/RH(D) 08/12/2016 A NEG   Final  . Antibody Screen 08/12/2016 NEG   Final  . Sample Expiration 08/12/2016 08/21/2016   Final  . Extend sample reason 08/12/2016 NO TRANSFUSIONS OR PREGNANCY IN THE PAST 3 MONTHS   Final  . MRSA, PCR 08/12/2016 NEGATIVE  NEGATIVE Final  . Staphylococcus aureus 08/12/2016 POSITIVE* NEGATIVE Final   Comment:        The Xpert SA Assay (FDA approved for NASAL specimens in patients over 65 years of age), is one component of a comprehensive surveillance program.  Test performance has been validated by [facility] for patients greater than or equal to 26 year old. It is not intended to diagnose infection nor to guide or monitor treatment.   . ABO/RH(D) 08/12/2016 A NEG   Final     X-Rays:Dg Pelvis Portable  Result Date: 08/18/2016 CLINICAL DATA:  Post- operative left total hip joint prosthesis placement. EXAM: PORTABLE PELVIS 1-2 VIEWS COMPARISON:  AP pelvis of March 18, 2016 FINDINGS: The patient has undergone placement of a left hip joint prosthesis. Radiographic positioning of the prosthetic components is good. The interface with the native bone appears normal. No acute native bone fracture is observed. A surgical drain line is present. IMPRESSION: There is no immediate postprocedure complication following left hip joint prosthesis placement.  Electronically Signed   By: David  Martinique M.D.   On: 08/18/2016 16:31   Dg C-arm 1-60 Min-no Report  Result Date: 08/18/2016 Fluoroscopy was utilized by the requesting physician.  No radiographic interpretation.    EKG: Orders placed or performed in visit on 07/14/16  . EKG 12-Lead     Hospital Course: Patient was admitted to [facility] and taken to the OR and underwent the above state procedure without complications.  Patient tolerated the procedure well and was later transferred to the recovery room and then to the orthopaedic floor for postoperative care.  They were given PO and IV analgesics for pain control following their surgery.  They were given 24 hours of postoperative antibiotics of  Anti-infectives    Start     Dose/Rate Route Frequency Ordered Stop   08/18/16 2200  ceFAZolin (ANCEF) IVPB 2g/100 mL premix     2 g 200 mL/hr over 30 Minutes Intravenous Every 8 hours 08/18/16 1731 08/19/16 0627   08/18/16 2100  ceFAZolin (ANCEF) IVPB 2g/100 mL premix  Status:  Discontinued     2 g 200 mL/hr over 30 Minutes Intravenous Every 6 hours 08/18/16 1719 08/18/16 1731   08/18/16 1203  ceFAZolin (ANCEF) 2-4 GM/100ML-% IVPB    Comments:  [place] Session   : cabinet override      08/18/16 1203 08/18/16 1405   08/18/16 1156  ceFAZolin (ANCEF) IVPB 2g/100 mL premix     2 g 200 mL/hr over 30 Minutes Intravenous On call to O.R. 08/18/16 1156 08/18/16 1420     and started on DVT prophylaxis in  the form of Xarelto.   PT and OT were ordered for total hip protocol.  The patient was allowed to be WBAT with therapy. Discharge planning was consulted to help with postop disposition and equipment needs.  Patient had a decent night on the evening of surgery.  They started to get up OOB with therapy on day one.  Hemovac drain was pulled without difficulty.  Continued to work with therapy into day two.  Dressing was changed on day two and the incision was healing well. Patient was seen in rounds  by Dr. Wynelle Link on POD 2 and was ready to go home.  Discharge home with home health Diet - Cardiac diet Follow up - in 2 weeks Activity - WBAT Disposition - Home Condition Upon Discharge - Good D/C Meds - See DC Summary DVT Prophylaxis - Xarelto   Discharge Instructions    Call MD / Call 911    Complete by:  As directed    If you experience chest pain or shortness of breath, CALL 911 and be transported to the hospital emergency room.  If you develope a fever above 101 F, pus (white drainage) or increased drainage or redness at the wound, or calf pain, call your surgeon's office.   Change dressing    Complete by:  As directed    You may change your dressing dressing daily with sterile 4 x 4 inch gauze dressing and paper tape.  Do not submerge the incision under water.   Constipation Prevention    Complete by:  As directed    Drink plenty of fluids.  Prune juice may be helpful.  You may use a stool softener, such as Colace (over the counter) 100 mg twice a day.  Use MiraLax (over the counter) for constipation as needed.   Diet - low sodium heart healthy    Complete by:  As directed    Diet Carb Modified    Complete by:  As directed    Discharge instructions    Complete by:  As directed    Take Xarelto for two and a half more weeks, then discontinue Xarelto. Once the patient has completed the blood thinner regimen, then take a Baby 81 mg Aspirin daily for three more weeks.   Pick up stool softner and laxative for home use following surgery while on pain medications. Do not submerge incision under water. Please use good hand washing techniques while changing dressing each day. May shower starting three days after surgery. Please use a clean towel to pat the incision dry following showers. Continue to use ice for pain and swelling after surgery. Do not use any lotions or creams on the incision until instructed by your surgeon.  Wear both TED hose on both legs during the day every  day for three weeks, but may remove the TED hose at night at home.  Postoperative Constipation Protocol  Constipation - defined medically as fewer than three stools per week and severe constipation as less than one stool per week.  One of the most common issues patients have following surgery is constipation.  Even if you have a regular bowel pattern at home, your normal regimen is likely to be disrupted due to multiple reasons following surgery.  Combination of anesthesia, postoperative narcotics, change in appetite and fluid intake all can affect your bowels.  In order to avoid complications following surgery, here are some recommendations in order to help you during your recovery period.  Colace (docusate) - Pick up an  over-the-counter form of Colace or another stool softener and take twice a day as long as you are requiring postoperative pain medications.  Take with a full glass of water daily.  If you experience loose stools or diarrhea, hold the colace until you stool forms back up.  If your symptoms do not get better within 1 week or if they get worse, check with your doctor.  Dulcolax (bisacodyl) - Pick up over-the-counter and take as directed by the product packaging as needed to assist with the movement of your bowels.  Take with a full glass of water.  Use this product as needed if not relieved by Colace only.   MiraLax (polyethylene glycol) - Pick up over-the-counter to have on hand.  MiraLax is a solution that will increase the amount of water in your bowels to assist with bowel movements.  Take as directed and can mix with a glass of water, juice, soda, coffee, or tea.  Take if you go more than two days without a movement. Do not use MiraLax more than once per day. Call your doctor if you are still constipated or irregular after using this medication for 7 days in a row.  If you continue to have problems with postoperative constipation, please contact the office for further assistance  and recommendations.  If you experience "the worst abdominal pain ever" or develop nausea or vomiting, please contact the office immediatly for further recommendations for treatment.   Do not sit on low chairs, stoools or toilet seats, as it may be difficult to get up from low surfaces    Complete by:  As directed    Driving restrictions    Complete by:  As directed    No driving until released by the physician.   Increase activity slowly as tolerated    Complete by:  As directed    Lifting restrictions    Complete by:  As directed    No lifting until released by the physician.   Patient may shower    Complete by:  As directed    You may shower without a dressing once there is no drainage.  Do not wash over the wound.  If drainage remains, do not shower until drainage stops.   TED hose    Complete by:  As directed    Use stockings (TED hose) for 3 weeks on both leg(s).  You may remove them at night for sleeping.   Weight bearing as tolerated    Complete by:  As directed    Laterality:  left   Extremity:  Lower     Allergies as of 08/20/2016   No Known Allergies     Medication List    STOP taking these medications   ibuprofen 200 MG tablet Commonly known as:  ADVIL,MOTRIN   Vitamin D3 5000 units Caps     TAKE these medications   acetaminophen 325 MG tablet Commonly known as:  TYLENOL Take 2 tablets (650 mg total) by mouth every 4 (four) hours as needed for mild pain (or temp > 37.5 C (99.5 F)).   ALPRAZolam 1 MG tablet Commonly known as:  XANAX Take 1 mg by mouth 3 (three) times daily as needed for anxiety or sleep.   fluticasone 50 MCG/ACT nasal spray Commonly known as:  FLONASE Place 1 spray into both nostrils daily as needed for allergies or rhinitis.   gabapentin 300 MG capsule Commonly known as:  NEURONTIN Take 300-900 mg by mouth 3 (three) times daily.  Take 356m in the am and afternoon, and then take 9074mat night   metoprolol tartrate 25 MG tablet Commonly  known as:  LOPRESSOR Take 1 tablet (25 mg total) by mouth 2 (two) times daily.   MOISTURIZING LUBRICANT EYE OP Place 1 drop into both eyes daily as needed (dry eyes).   oxycodone 30 MG immediate release tablet Commonly known as:  ROXICODONE Take 1 tablet (30 mg total) by mouth every 4 (four) hours as needed for pain (for pain). Reported on 06/03/2015   rivaroxaban 10 MG Tabs tablet Commonly known as:  XARELTO Take 1 tablet (10 mg total) by mouth daily with breakfast. Take Xarelto for two and a half more weeks following discharge from the hospital, then discontinue Xarelto. Once the patient has completed the blood thinner regimen, then take a Baby 81 mg Aspirin daily for three more weeks. Start taking on:  08/21/2016   sertraline 100 MG tablet Commonly known as:  ZOLOFT Take 100 mg by mouth at bedtime.   tiZANidine 4 MG tablet Commonly known as:  ZANAFLEX Take 1 tablet (4 mg total) by mouth every 6 (six) hours as needed for muscle spasms.   torsemide 10 MG tablet Commonly known as:  DEMADEX Take 10 mg by mouth daily as needed (fluid).      Follow-up Information    [facility] Follow up.   Specialty:  [place]:  [facility] Physical Therapy Contact information: [phone]        AlGaynelle ArabianMD. Schedule an appointment as soon as possible for a visit on 08/31/2016.   Specialty:  Orthopedic Surgery Contact information: [phone]         Signed: DrArlee MuslimPA-C Orthopaedic Surgery 08/20/2016, 8:23 AM

## 2016-08-21 DIAGNOSIS — M4302 Spondylolysis, cervical region: Secondary | ICD-10-CM | POA: Diagnosis not present

## 2016-08-21 DIAGNOSIS — G9009 Other idiopathic peripheral autonomic neuropathy: Secondary | ICD-10-CM | POA: Diagnosis not present

## 2016-08-21 DIAGNOSIS — Z96642 Presence of left artificial hip joint: Secondary | ICD-10-CM | POA: Diagnosis not present

## 2016-08-21 DIAGNOSIS — G8929 Other chronic pain: Secondary | ICD-10-CM | POA: Diagnosis not present

## 2016-08-21 DIAGNOSIS — J45909 Unspecified asthma, uncomplicated: Secondary | ICD-10-CM | POA: Diagnosis not present

## 2016-08-21 DIAGNOSIS — I10 Essential (primary) hypertension: Secondary | ICD-10-CM | POA: Diagnosis not present

## 2016-08-21 DIAGNOSIS — M797 Fibromyalgia: Secondary | ICD-10-CM | POA: Diagnosis not present

## 2016-08-21 DIAGNOSIS — Z86718 Personal history of other venous thrombosis and embolism: Secondary | ICD-10-CM | POA: Diagnosis not present

## 2016-08-21 DIAGNOSIS — Z9181 History of falling: Secondary | ICD-10-CM | POA: Diagnosis not present

## 2016-08-21 DIAGNOSIS — Z471 Aftercare following joint replacement surgery: Secondary | ICD-10-CM | POA: Diagnosis not present

## 2016-08-21 DIAGNOSIS — M1712 Unilateral primary osteoarthritis, left knee: Secondary | ICD-10-CM | POA: Diagnosis not present

## 2016-08-21 DIAGNOSIS — D500 Iron deficiency anemia secondary to blood loss (chronic): Secondary | ICD-10-CM | POA: Diagnosis not present

## 2016-08-21 DIAGNOSIS — F329 Major depressive disorder, single episode, unspecified: Secondary | ICD-10-CM | POA: Diagnosis not present

## 2016-08-21 DIAGNOSIS — Z96651 Presence of right artificial knee joint: Secondary | ICD-10-CM | POA: Diagnosis not present

## 2016-08-22 DIAGNOSIS — G8929 Other chronic pain: Secondary | ICD-10-CM | POA: Diagnosis not present

## 2016-08-22 DIAGNOSIS — M797 Fibromyalgia: Secondary | ICD-10-CM | POA: Diagnosis not present

## 2016-08-22 DIAGNOSIS — Z96642 Presence of left artificial hip joint: Secondary | ICD-10-CM | POA: Diagnosis not present

## 2016-08-22 DIAGNOSIS — I10 Essential (primary) hypertension: Secondary | ICD-10-CM | POA: Diagnosis not present

## 2016-08-22 DIAGNOSIS — D500 Iron deficiency anemia secondary to blood loss (chronic): Secondary | ICD-10-CM | POA: Diagnosis not present

## 2016-08-22 DIAGNOSIS — J45909 Unspecified asthma, uncomplicated: Secondary | ICD-10-CM | POA: Diagnosis not present

## 2016-08-22 DIAGNOSIS — Z471 Aftercare following joint replacement surgery: Secondary | ICD-10-CM | POA: Diagnosis not present

## 2016-08-22 DIAGNOSIS — M4302 Spondylolysis, cervical region: Secondary | ICD-10-CM | POA: Diagnosis not present

## 2016-08-22 DIAGNOSIS — G9009 Other idiopathic peripheral autonomic neuropathy: Secondary | ICD-10-CM | POA: Diagnosis not present

## 2016-08-22 DIAGNOSIS — Z96651 Presence of right artificial knee joint: Secondary | ICD-10-CM | POA: Diagnosis not present

## 2016-08-22 DIAGNOSIS — M1712 Unilateral primary osteoarthritis, left knee: Secondary | ICD-10-CM | POA: Diagnosis not present

## 2016-08-22 DIAGNOSIS — Z9181 History of falling: Secondary | ICD-10-CM | POA: Diagnosis not present

## 2016-08-22 DIAGNOSIS — Z86718 Personal history of other venous thrombosis and embolism: Secondary | ICD-10-CM | POA: Diagnosis not present

## 2016-08-22 DIAGNOSIS — F329 Major depressive disorder, single episode, unspecified: Secondary | ICD-10-CM | POA: Diagnosis not present

## 2016-08-23 DIAGNOSIS — M4302 Spondylolysis, cervical region: Secondary | ICD-10-CM | POA: Diagnosis not present

## 2016-08-23 DIAGNOSIS — Z96642 Presence of left artificial hip joint: Secondary | ICD-10-CM | POA: Diagnosis not present

## 2016-08-23 DIAGNOSIS — M797 Fibromyalgia: Secondary | ICD-10-CM | POA: Diagnosis not present

## 2016-08-23 DIAGNOSIS — M1712 Unilateral primary osteoarthritis, left knee: Secondary | ICD-10-CM | POA: Diagnosis not present

## 2016-08-23 DIAGNOSIS — J45909 Unspecified asthma, uncomplicated: Secondary | ICD-10-CM | POA: Diagnosis not present

## 2016-08-23 DIAGNOSIS — Z471 Aftercare following joint replacement surgery: Secondary | ICD-10-CM | POA: Diagnosis not present

## 2016-08-23 DIAGNOSIS — F329 Major depressive disorder, single episode, unspecified: Secondary | ICD-10-CM | POA: Diagnosis not present

## 2016-08-23 DIAGNOSIS — Z86718 Personal history of other venous thrombosis and embolism: Secondary | ICD-10-CM | POA: Diagnosis not present

## 2016-08-23 DIAGNOSIS — Z9181 History of falling: Secondary | ICD-10-CM | POA: Diagnosis not present

## 2016-08-23 DIAGNOSIS — I10 Essential (primary) hypertension: Secondary | ICD-10-CM | POA: Diagnosis not present

## 2016-08-23 DIAGNOSIS — G9009 Other idiopathic peripheral autonomic neuropathy: Secondary | ICD-10-CM | POA: Diagnosis not present

## 2016-08-23 DIAGNOSIS — Z96651 Presence of right artificial knee joint: Secondary | ICD-10-CM | POA: Diagnosis not present

## 2016-08-23 DIAGNOSIS — D500 Iron deficiency anemia secondary to blood loss (chronic): Secondary | ICD-10-CM | POA: Diagnosis not present

## 2016-08-23 DIAGNOSIS — G8929 Other chronic pain: Secondary | ICD-10-CM | POA: Diagnosis not present

## 2016-08-31 DIAGNOSIS — Z471 Aftercare following joint replacement surgery: Secondary | ICD-10-CM | POA: Diagnosis not present

## 2016-08-31 DIAGNOSIS — Z96642 Presence of left artificial hip joint: Secondary | ICD-10-CM | POA: Diagnosis not present

## 2016-09-06 DIAGNOSIS — M797 Fibromyalgia: Secondary | ICD-10-CM | POA: Diagnosis not present

## 2016-09-06 DIAGNOSIS — Z9181 History of falling: Secondary | ICD-10-CM | POA: Diagnosis not present

## 2016-09-06 DIAGNOSIS — M1712 Unilateral primary osteoarthritis, left knee: Secondary | ICD-10-CM | POA: Diagnosis not present

## 2016-09-06 DIAGNOSIS — Z96642 Presence of left artificial hip joint: Secondary | ICD-10-CM | POA: Diagnosis not present

## 2016-09-06 DIAGNOSIS — G8929 Other chronic pain: Secondary | ICD-10-CM | POA: Diagnosis not present

## 2016-09-06 DIAGNOSIS — Z96651 Presence of right artificial knee joint: Secondary | ICD-10-CM | POA: Diagnosis not present

## 2016-09-06 DIAGNOSIS — I10 Essential (primary) hypertension: Secondary | ICD-10-CM | POA: Diagnosis not present

## 2016-09-06 DIAGNOSIS — D500 Iron deficiency anemia secondary to blood loss (chronic): Secondary | ICD-10-CM | POA: Diagnosis not present

## 2016-09-06 DIAGNOSIS — J45909 Unspecified asthma, uncomplicated: Secondary | ICD-10-CM | POA: Diagnosis not present

## 2016-09-06 DIAGNOSIS — F329 Major depressive disorder, single episode, unspecified: Secondary | ICD-10-CM | POA: Diagnosis not present

## 2016-09-06 DIAGNOSIS — M4302 Spondylolysis, cervical region: Secondary | ICD-10-CM | POA: Diagnosis not present

## 2016-09-06 DIAGNOSIS — Z471 Aftercare following joint replacement surgery: Secondary | ICD-10-CM | POA: Diagnosis not present

## 2016-09-06 DIAGNOSIS — Z86718 Personal history of other venous thrombosis and embolism: Secondary | ICD-10-CM | POA: Diagnosis not present

## 2016-09-06 DIAGNOSIS — G9009 Other idiopathic peripheral autonomic neuropathy: Secondary | ICD-10-CM | POA: Diagnosis not present

## 2016-09-08 ENCOUNTER — Ambulatory Visit: Payer: PPO | Admitting: Internal Medicine

## 2016-09-08 NOTE — Progress Notes (Signed)
No show letter sent.

## 2016-09-13 DIAGNOSIS — Z96642 Presence of left artificial hip joint: Secondary | ICD-10-CM | POA: Diagnosis not present

## 2016-09-13 DIAGNOSIS — M4120 Other idiopathic scoliosis, site unspecified: Secondary | ICD-10-CM | POA: Diagnosis not present

## 2016-09-13 DIAGNOSIS — M2550 Pain in unspecified joint: Secondary | ICD-10-CM | POA: Diagnosis not present

## 2016-09-13 DIAGNOSIS — G8929 Other chronic pain: Secondary | ICD-10-CM | POA: Diagnosis not present

## 2016-09-13 DIAGNOSIS — M961 Postlaminectomy syndrome, not elsewhere classified: Secondary | ICD-10-CM | POA: Diagnosis not present

## 2016-09-23 DIAGNOSIS — I10 Essential (primary) hypertension: Secondary | ICD-10-CM | POA: Diagnosis not present

## 2016-09-23 DIAGNOSIS — J45909 Unspecified asthma, uncomplicated: Secondary | ICD-10-CM | POA: Diagnosis not present

## 2016-09-23 DIAGNOSIS — G9009 Other idiopathic peripheral autonomic neuropathy: Secondary | ICD-10-CM | POA: Diagnosis not present

## 2016-09-23 DIAGNOSIS — M4302 Spondylolysis, cervical region: Secondary | ICD-10-CM | POA: Diagnosis not present

## 2016-09-23 DIAGNOSIS — G8929 Other chronic pain: Secondary | ICD-10-CM | POA: Diagnosis not present

## 2016-09-23 DIAGNOSIS — Z86718 Personal history of other venous thrombosis and embolism: Secondary | ICD-10-CM | POA: Diagnosis not present

## 2016-09-23 DIAGNOSIS — M797 Fibromyalgia: Secondary | ICD-10-CM | POA: Diagnosis not present

## 2016-09-23 DIAGNOSIS — Z9181 History of falling: Secondary | ICD-10-CM | POA: Diagnosis not present

## 2016-09-23 DIAGNOSIS — Z96642 Presence of left artificial hip joint: Secondary | ICD-10-CM | POA: Diagnosis not present

## 2016-09-23 DIAGNOSIS — M1612 Unilateral primary osteoarthritis, left hip: Secondary | ICD-10-CM | POA: Diagnosis not present

## 2016-09-23 DIAGNOSIS — F329 Major depressive disorder, single episode, unspecified: Secondary | ICD-10-CM | POA: Diagnosis not present

## 2016-09-23 DIAGNOSIS — M1712 Unilateral primary osteoarthritis, left knee: Secondary | ICD-10-CM | POA: Diagnosis not present

## 2016-09-23 DIAGNOSIS — Z96651 Presence of right artificial knee joint: Secondary | ICD-10-CM | POA: Diagnosis not present

## 2016-09-23 DIAGNOSIS — Z471 Aftercare following joint replacement surgery: Secondary | ICD-10-CM | POA: Diagnosis not present

## 2016-09-23 DIAGNOSIS — D500 Iron deficiency anemia secondary to blood loss (chronic): Secondary | ICD-10-CM | POA: Diagnosis not present

## 2016-11-03 DIAGNOSIS — G8929 Other chronic pain: Secondary | ICD-10-CM | POA: Diagnosis not present

## 2016-11-03 DIAGNOSIS — M25512 Pain in left shoulder: Secondary | ICD-10-CM | POA: Diagnosis not present

## 2016-11-10 ENCOUNTER — Other Ambulatory Visit: Payer: Self-pay | Admitting: Orthopedic Surgery

## 2016-11-10 DIAGNOSIS — M25512 Pain in left shoulder: Principal | ICD-10-CM

## 2016-11-10 DIAGNOSIS — G8929 Other chronic pain: Secondary | ICD-10-CM

## 2016-11-11 ENCOUNTER — Ambulatory Visit
Admission: RE | Admit: 2016-11-11 | Discharge: 2016-11-11 | Disposition: A | Discharge status: Home / Self Care | Payer: PPO | Source: Ambulatory Visit | Attending: Orthopedic Surgery | Admitting: Orthopedic Surgery

## 2016-11-11 DIAGNOSIS — G8929 Other chronic pain: Secondary | ICD-10-CM

## 2016-11-11 DIAGNOSIS — M19012 Primary osteoarthritis, left shoulder: Secondary | ICD-10-CM | POA: Diagnosis not present

## 2016-11-11 DIAGNOSIS — M25512 Pain in left shoulder: Principal | ICD-10-CM

## 2016-11-18 ENCOUNTER — Telehealth: Payer: Self-pay | Admitting: *Deleted

## 2016-11-18 NOTE — Telephone Encounter (Signed)
Faxed form on 11/12/16  For clearance  Left shoulder: left shoulder reverse TSA  PER DR HARDING - PATIENT IS CLEARED FOR SRGERY - LOW RISK

## 2016-11-22 DIAGNOSIS — I10 Essential (primary) hypertension: Secondary | ICD-10-CM | POA: Diagnosis not present

## 2016-11-22 DIAGNOSIS — R05 Cough: Secondary | ICD-10-CM | POA: Diagnosis not present

## 2016-11-22 DIAGNOSIS — R399 Unspecified symptoms and signs involving the genitourinary system: Secondary | ICD-10-CM | POA: Diagnosis not present

## 2016-11-23 DIAGNOSIS — Z1231 Encounter for screening mammogram for malignant neoplasm of breast: Secondary | ICD-10-CM | POA: Diagnosis not present

## 2016-12-06 DIAGNOSIS — Z6828 Body mass index (BMI) 28.0-28.9, adult: Secondary | ICD-10-CM | POA: Diagnosis not present

## 2016-12-06 DIAGNOSIS — Z01419 Encounter for gynecological examination (general) (routine) without abnormal findings: Secondary | ICD-10-CM | POA: Diagnosis not present

## 2016-12-09 DIAGNOSIS — H5203 Hypermetropia, bilateral: Secondary | ICD-10-CM | POA: Diagnosis not present

## 2016-12-09 DIAGNOSIS — H52223 Regular astigmatism, bilateral: Secondary | ICD-10-CM | POA: Diagnosis not present

## 2016-12-09 DIAGNOSIS — H524 Presbyopia: Secondary | ICD-10-CM | POA: Diagnosis not present

## 2016-12-09 DIAGNOSIS — Z9849 Cataract extraction status, unspecified eye: Secondary | ICD-10-CM | POA: Diagnosis not present

## 2016-12-09 DIAGNOSIS — Z961 Presence of intraocular lens: Secondary | ICD-10-CM | POA: Diagnosis not present

## 2016-12-09 DIAGNOSIS — H539 Unspecified visual disturbance: Secondary | ICD-10-CM | POA: Diagnosis not present

## 2016-12-20 DIAGNOSIS — Z79899 Other long term (current) drug therapy: Secondary | ICD-10-CM | POA: Diagnosis not present

## 2016-12-20 DIAGNOSIS — M2550 Pain in unspecified joint: Secondary | ICD-10-CM | POA: Diagnosis not present

## 2016-12-20 DIAGNOSIS — M961 Postlaminectomy syndrome, not elsewhere classified: Secondary | ICD-10-CM | POA: Diagnosis not present

## 2016-12-20 DIAGNOSIS — Z23 Encounter for immunization: Secondary | ICD-10-CM | POA: Diagnosis not present

## 2016-12-23 NOTE — H&P (Signed)
Shirley [name] is an 76 y.o. female.    Chief Complaint: left shoulder pain   HPI: Pt is a 76 y.o. female complaining of left shoulder pain for multiple years. Pain had continually increased since the beginning. X-rays in the clinic show end-stage arthritic changes of the left shoulder. Pt has tried various conservative treatments which have failed to alleviate their symptoms, including injections and therapy. Various options are discussed with the patient. Risks, benefits and expectations were discussed with the patient. Patient understand the risks, benefits and expectations and wishes to proceed with surgery.   PCP:  Ardith Dark, PA-C  D/C Plans: Home  PMH: Past Medical History:  Diagnosis Date  . Allergy    vasomotor rhinitis  . Anemia   . Asthma    h/o asthma and bronchitis  . BV (bacterial vaginosis)    history of frequent  . Cervical spondylosis    herniated disk protruding @ C6-7, impinging cord and left axillary root sleeve.  . Chronic pain   . Closed olecranon fracture, left, initial encounter   . Depression   . DVT of upper extremity (deep vein thrombosis) ([place])    h/o left(after wrist fracture/surgery); no residual thrombus or phlebitis by Dopplers July 2014  . Dyspnea    with exertion  . Edema   . Fibromyalgia    sees Dr.Phillips-pain management  . GERD (gastroesophageal reflux disease)    h/o  . Headache    marigraine- hormal - none in years  . Hypertension    never has been treated that daughter is aware  . Neuropathy   . Neuropathy   . Osteopenia    mild at hip (T-1.3)  . PAT (paroxysmal atrial tachycardia) ([place])   . Pneumonia    years ago  . Recurrent falls 04/16/2016  . Varicose veins   . 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 Eilleen Kempf @ [facility]  . Vision problem    wears glasses  . Vitamin D deficiency     PSH: Past Surgical History:  Procedure Laterality Date  . ABDOMINAL HYSTERECTOMY  1999   BSO for endometriosis  . APPENDECTOMY    . BACK SURGERY  age 49   ruptured disk L3-4   . CATARACT EXTRACTION  2000   B/L  . COLONOSCOPY    . FINGER SURGERY     left finger for tender rupture from fall.  Marland Kitchen FOOT SURGERY     multiple(at least 4 on right, 5 on left)-left Dr.Kerner([facility])11/08,left Dr.Nunley-excision 2nd and 3rd mt heads w/artho and hammertoe surgery 4th and 5th 04/2006. left great toe IP fusion and revision of fusion of 2nd through 5th toes 12/2005. Right foot surgeries  Dr.Bednarz 04/2008 and 04/2009.  Marland Kitchen KNEE SURGERY Right    right knee replacement  . NECK SURGERY  2007   cervical  . ORIF ELBOW FRACTURE Left 03/22/2016   Procedure: OPEN REDUCTION INTERNAL FIXATION (ORIF) ELBOW/OLECRANON FRACTURE;  Surgeon: Altamese Watson, MD;  Location: [facility];  Service: Orthopedics;  Laterality: Left;  . ORIF ELBOW FRACTURE Left 04/15/2016   Procedure: OPEN REDUCTION INTERNAL FIXATION (ORIF) LEFT ELBOW/OLECRANON FRACTURE;  Surgeon: Altamese [place], MD;  Location: [facility];  Service: Orthopedics;  Laterality: Left;  . RADIOLOGY WITH ANESTHESIA Left 01/06/2016   Procedure: MRI LEFT HIP  WITH AND WITHOUT;  Surgeon: Medication Radiologist, MD;  Location: [facility];  Service: Radiology;  Laterality: Left;  . REFRACTIVE SURGERY  2007   left eye  . SHOULDER SURGERY     x8 (4 on rt side and 4 on left side)  . SPINAL FUSION  6/09   Dr.Cohen  . STERIOD INJECTION Left 04/15/2016   Procedure: LEFT HIP STEROID INJECTION;  Surgeon: Altamese Hundred, MD;  Location: [facility];  Service: Orthopedics;  Laterality: Left;  . TONSILLECTOMY AND ADENOIDECTOMY    . TOTAL HIP ARTHROPLASTY Left 08/18/2016   Procedure: LEFT TOTAL HIP ARTHROPLASTY ANTERIOR APPROACH;  Surgeon: Gaynelle Arabian, MD;  Location: [facility];  Service: Orthopedics;  Laterality: Left;  .  TRANSTHORACIC ECHOCARDIOGRAM  July 2012   Normal EF 60-65% , mild concentric LVH. Grade 2 diastolic dysfunction with elevated EDP. Massive left atrial dilation. Pulmonary pressures 30-40 mm Hg; aortic sclerosis but no stenosis. Moderate TR  . TRANSTHORACIC ECHOCARDIOGRAM  03/2016   EF 65-70%. Normal wall motion. GR 1 DD. Severe LA dilation. Mild to moderate TR with mild to moderately elevated PA pressures (44 mmHg) mild aortic calcification but no stenosis. No source of emboli   . WRIST SURGERY Left    2 surgeries left wrist after fracture(complicated by DVT)    Social History:  reports that she has quit smoking. She has never used smokeless tobacco. She reports that she does not drink alcohol or use drugs.  Allergies:  No Known Allergies  Medications: No current facility-administered medications for this encounter.    Current Outpatient Prescriptions  Medication Sig Dispense Refill  . acetaminophen (TYLENOL) 325 MG tablet Take 2 tablets (650 mg total) by mouth every 4 (four) hours as needed for mild pain (or temp > 37.5 C (99.5 F)). (Patient not taking: Reported on 08/10/2016)    . ALPRAZolam (XANAX) 1 MG tablet Take 1 mg by mouth 3 (three) times daily as needed for anxiety or sleep.     . Carboxymethylcellulose Sodium (MOISTURIZING LUBRICANT EYE OP) Place 1 drop into both eyes daily as needed (dry eyes).    . fluticasone (FLONASE) 50 MCG/ACT nasal spray Place 1 spray into both nostrils daily as needed for allergies or rhinitis.     Marland Kitchen gabapentin (NEURONTIN) 300 MG capsule Take 300-900 mg by mouth 3 (three) times daily. Take 300mg  in the am and afternoon, and then take 900mg  at night    . metoprolol tartrate (LOPRESSOR) 25 MG tablet Take 1 tablet (25 mg total) by mouth 2 (two) times daily. 60 tablet 0  . oxycodone (ROXICODONE) 30 MG immediate release tablet Take 1 tablet (30 mg total) by mouth every 4 (four) hours as needed for pain (for pain). Reported on 06/03/2015 60 tablet 0  . rivaroxaban  (XARELTO) 10 MG TABS tablet Take 1 tablet (10 mg total) by mouth daily with breakfast. Take Xarelto for two and a half more weeks following discharge from the hospital, then discontinue Xarelto. Once the patient has completed the blood thinner regimen, then take a Baby 81 mg Aspirin daily for three more weeks. 19 tablet 0  . sertraline (ZOLOFT) 100 MG tablet Take 100 mg by mouth at bedtime.    Marland Kitchen tiZANidine (ZANAFLEX) 4 MG tablet Take 1 tablet (4 mg total) by mouth every 6 (six) hours as needed for muscle spasms. 60 tablet 1  . torsemide (DEMADEX) 10 MG tablet Take 10 mg by mouth daily as needed (fluid).       No results found  for this or any previous visit (from the past 48 hour(s)). No results found.  ROS: Pain with rom of the left upper extremity  Physical Exam:  Alert and oriented 76 y.o. female in no acute distress Cranial nerves 2-12 intact Cervical spine: full rom with no tenderness, nv intact distally Chest: active breath sounds bilaterally, no wheeze rhonchi or rales Heart: regular rate and rhythm, no murmur Abd: non tender non distended with active bowel sounds Hip is stable with rom  Left shoulder crepitus and weakness with rom nv intact distally No rashes or edema  Assessment/Plan Assessment: left shoulder cuff arthropathy  Plan: Patient will undergo a left reverse total shoulder by Dr. Veverly Fells at [facility]. Risks benefits and expectations were discussed with the patient. Patient understand risks, benefits and expectations and wishes to proceed.

## 2016-12-28 ENCOUNTER — Encounter (HOSPITAL_COMMUNITY)
Admission: RE | Admit: 2016-12-28 | Discharge: 2016-12-28 | Disposition: A | Discharge status: Home / Self Care | Payer: PPO | Source: Ambulatory Visit | Attending: Orthopedic Surgery | Admitting: Orthopedic Surgery

## 2016-12-28 ENCOUNTER — Other Ambulatory Visit: Payer: Self-pay

## 2016-12-28 ENCOUNTER — Encounter (HOSPITAL_COMMUNITY): Payer: Self-pay

## 2016-12-28 DIAGNOSIS — M75102 Unspecified rotator cuff tear or rupture of left shoulder, not specified as traumatic: Secondary | ICD-10-CM | POA: Diagnosis present

## 2016-12-28 DIAGNOSIS — Z86718 Personal history of other venous thrombosis and embolism: Secondary | ICD-10-CM | POA: Diagnosis not present

## 2016-12-28 DIAGNOSIS — Z79899 Other long term (current) drug therapy: Secondary | ICD-10-CM | POA: Diagnosis not present

## 2016-12-28 DIAGNOSIS — M19012 Primary osteoarthritis, left shoulder: Secondary | ICD-10-CM | POA: Diagnosis present

## 2016-12-28 DIAGNOSIS — Z87891 Personal history of nicotine dependence: Secondary | ICD-10-CM | POA: Diagnosis not present

## 2016-12-28 DIAGNOSIS — Z96642 Presence of left artificial hip joint: Secondary | ICD-10-CM | POA: Diagnosis present

## 2016-12-28 DIAGNOSIS — Z7901 Long term (current) use of anticoagulants: Secondary | ICD-10-CM | POA: Diagnosis not present

## 2016-12-28 DIAGNOSIS — M12812 Other specific arthropathies, not elsewhere classified, left shoulder: Secondary | ICD-10-CM | POA: Diagnosis not present

## 2016-12-28 DIAGNOSIS — G8918 Other acute postprocedural pain: Secondary | ICD-10-CM | POA: Diagnosis not present

## 2016-12-28 DIAGNOSIS — G629 Polyneuropathy, unspecified: Secondary | ICD-10-CM | POA: Diagnosis present

## 2016-12-28 DIAGNOSIS — K219 Gastro-esophageal reflux disease without esophagitis: Secondary | ICD-10-CM | POA: Diagnosis present

## 2016-12-28 DIAGNOSIS — I10 Essential (primary) hypertension: Secondary | ICD-10-CM | POA: Diagnosis present

## 2016-12-28 DIAGNOSIS — F329 Major depressive disorder, single episode, unspecified: Secondary | ICD-10-CM | POA: Diagnosis present

## 2016-12-28 DIAGNOSIS — S4992XA Unspecified injury of left shoulder and upper arm, initial encounter: Secondary | ICD-10-CM | POA: Diagnosis not present

## 2016-12-28 DIAGNOSIS — J45909 Unspecified asthma, uncomplicated: Secondary | ICD-10-CM | POA: Diagnosis present

## 2016-12-28 DIAGNOSIS — M797 Fibromyalgia: Secondary | ICD-10-CM | POA: Diagnosis present

## 2016-12-28 DIAGNOSIS — Z96651 Presence of right artificial knee joint: Secondary | ICD-10-CM | POA: Diagnosis present

## 2016-12-28 DIAGNOSIS — G8929 Other chronic pain: Secondary | ICD-10-CM | POA: Diagnosis present

## 2016-12-28 HISTORY — DX: Cardiac murmur, unspecified: R01.1

## 2016-12-28 LAB — CBC
HCT: 35.7 % — ABNORMAL LOW (ref 36.0–46.0)
HEMOGLOBIN: 10.6 g/dL — AB (ref 12.0–15.0)
MCH: 26.8 pg (ref 26.0–34.0)
MCHC: 29.7 g/dL — AB (ref 30.0–36.0)
MCV: 90.2 fL (ref 78.0–100.0)
PLATELETS: 512 10*3/uL — AB (ref 150–400)
RBC: 3.96 MIL/uL (ref 3.87–5.11)
RDW: 14.8 % (ref 11.5–15.5)
WBC: 5.9 10*3/uL (ref 4.0–10.5)

## 2016-12-28 LAB — SURGICAL PCR SCREEN
MRSA, PCR: NEGATIVE
Staphylococcus aureus: NEGATIVE

## 2016-12-28 LAB — BASIC METABOLIC PANEL
ANION GAP: 8 (ref 5–15)
BUN: 15 mg/dL (ref 6–20)
CALCIUM: 9.2 mg/dL (ref 8.9–10.3)
CO2: 25 mmol/L (ref 22–32)
CREATININE: 1.1 mg/dL — AB (ref 0.44–1.00)
Chloride: 105 mmol/L (ref 101–111)
GFR calc Af Amer: 54 mL/min — ABNORMAL LOW (ref >60)
GFR, EST NON AFRICAN AMERICAN: 47 mL/min — AB (ref >60)
GLUCOSE: 116 mg/dL — AB (ref 65–99)
Potassium: 4.3 mmol/L (ref 3.5–5.1)
Sodium: 138 mmol/L (ref 135–145)

## 2016-12-28 NOTE — Pre-Procedure Instructions (Signed)
Shirley [name]  12/28/2016      [id number] - [address], [place] - [place] [address] Phone: [phone] Fax: [phone]    Your procedure is scheduled on Fri. Nov. 9  Report to [facility] at 8:15 A.M.  Call this number if you have problems the morning of surgery:  [phone]   Remember:  Do not eat food or drink liquids after midnight.  Take these medicines the morning of surgery with A SIP OF WATER : xanax if needed,eye drops, flonase nasal spray if needed, gabapentin, metoprolol (lopressor), oxycodone if needed, sertraline (zoloft),               7 days prior to surgery STOP taking any Aspirin (unless otherwise instructed by your surgeon), Aleve, Naproxen, Ibuprofen, Motrin, Advil, Goody's, BC's, all herbal medications, fish oil, and all vitamins   Do not wear jewelry, make-up or nail polish.  Do not wear lotions, powders, or perfumes, or deoderant.  Do not shave 48 hours prior to surgery.  Men may shave face and neck.  Do not bring valuables to the hospital.  [facility] is not responsible for any belongings or valuables.  Contacts, dentures or bridgework may not be worn into surgery.  Leave your suitcase in the car.  After surgery it may be brought to your room.  For patients admitted to the hospital, discharge time will be determined by your treatment team.  Patients discharged the day of surgery will not be allowed to drive home.    Special instructions:  [facility]- Preparing For Surgery  Before surgery, you can play an important role. Because skin is not sterile, your skin needs to be as free of germs as possible. You can reduce the number of germs on your skin by washing with CHG (chlorahexidine gluconate) Soap before surgery.  CHG is an antiseptic cleaner which kills germs and bonds with the skin to continue killing germs even after washing.  Please do not use if you have an allergy to CHG or  antibacterial soaps. If your skin becomes reddened/irritated stop using the CHG.  Do not shave (including legs and underarms) for at least 48 hours prior to first CHG shower. It is OK to shave your face.  Please follow these instructions carefully.   1. Shower the NIGHT BEFORE SURGERY and the MORNING OF SURGERY with CHG.   2. If you chose to wash your hair, wash your hair first as usual with your normal shampoo.  3. After you shampoo, rinse your hair and body thoroughly to remove the shampoo.  4. Use CHG as you would any other liquid soap. You can apply CHG directly to the skin and wash gently with a scrungie or a clean washcloth.   5. Apply the CHG Soap to your body ONLY FROM THE NECK DOWN.  Do not use on open wounds or open sores. Avoid contact with your eyes, ears, mouth and genitals (private parts). Wash Face and genitals (private parts)  with your normal soap.  6. Wash thoroughly, paying special attention to the area where your surgery will be performed.  7. Thoroughly rinse your body with warm water from the neck down.  8. DO NOT shower/wash with your normal soap after using and rinsing off the CHG Soap.  9. Pat yourself dry with a CLEAN TOWEL.  10. Wear CLEAN PAJAMAS to bed the night before surgery, wear comfortable clothes the morning of surgery  11. Place CLEAN SHEETS on your bed the night of your first shower and DO NOT SLEEP WITH PETS.    Day of Surgery: Do not apply any deodorants/lotions. Please wear clean clothes to the hospital/surgery center.      Please read over the following fact sheets that you were given. Coughing and Deep Breathing and Surgical Site Infection Prevention

## 2016-12-28 NOTE — Progress Notes (Signed)
PCP: Philip Aspen @ [facility] [place] [facility]  Cardiologist: Dr. Ellyn Hack

## 2016-12-30 NOTE — Progress Notes (Signed)
Anesthesia Chart Review:  Pt is a 76 year old female scheduled for reverse left shoulder arthroplasty on 12/31/2016 with Netta Cedars, M.D.  - PCP is Ardith Dark, PA (notes in care everywhere)  - Cardiologist is Glenetta Hew, MD who cleared pt for surgery at low risk (see telephone encounter 11/18/16 by Trixie Dredge, RN)  PMH includes: PAT, HTN, heart murmur, anemia, DVT (2014 after surgery), GERD. Former smoker. BMI 28. S/p L THA 08/18/16. S/p ORIF L elbow 04/15/16.   - Hospitalized 1/24-31/18 for acute renal failure, acute encephalopathy, fx L olecranon process  Medications include: Metoprolol, torsemide  Preoperative labs reviewed.    1 view CXR 03/18/16:  - Stable cardiomegaly. No acute pulmonary disease. Aortic atherosclerosis.  EKG 07/14/16: Sinus rhythm with PACs  Echo 03/18/16:  - Left ventricle: The cavity size was normal. There was moderate concentric hypertrophy. Systolic function was vigorous. The estimated ejection fraction was in the range of 65% to 70%. Wall motion was normal; there were no regional wall motion abnormalities. Doppler parameters are consistent with abnormal left ventricular relaxation (grade 1 diastolic dysfunction). - Aortic valve: Trileaflet; normal thickness, mildly calcified leaflets. Valve area (VTI): 1.86 cm^2. Valve area (Vmax): 1.92 cm^2. Valve area (Vmean): 1.83 cm^2. - Mitral valve: Valve area by continuity equation (using LVOT flow): 2.15 cm^2. - Left atrium: The atrium was severely dilated. - Tricuspid valve: There was mild-moderate regurgitation. - Pulmonary arteries: Systolic pressure was mildly to moderately increased. PA peak pressure: 44 mm Hg (S).  If no changes, I anticipate pt can proceed with surgery as scheduled.   Willeen Cass, FNP-BC [facility] Stay Surgical [facility]/Anesthesiology Phone: [phone] 12/30/2016 10:15 AM

## 2016-12-31 ENCOUNTER — Inpatient Hospital Stay (HOSPITAL_COMMUNITY): Payer: PPO

## 2016-12-31 ENCOUNTER — Inpatient Hospital Stay (HOSPITAL_COMMUNITY): Payer: PPO | Admitting: Anesthesiology

## 2016-12-31 ENCOUNTER — Inpatient Hospital Stay (HOSPITAL_COMMUNITY): Payer: PPO | Admitting: Emergency Medicine

## 2016-12-31 ENCOUNTER — Encounter (HOSPITAL_COMMUNITY)
Admission: RE | Disposition: A | Discharge status: Home / Self Care | Payer: Self-pay | Source: Ambulatory Visit | Attending: Orthopedic Surgery

## 2016-12-31 ENCOUNTER — Inpatient Hospital Stay (HOSPITAL_COMMUNITY)
Admission: RE | Admit: 2016-12-31 | Discharge: 2017-01-01 | DRG: 483 | Disposition: A | Discharge status: Home / Self Care | Payer: PPO | Source: Ambulatory Visit | Attending: Orthopedic Surgery | Admitting: Orthopedic Surgery

## 2016-12-31 ENCOUNTER — Encounter (HOSPITAL_COMMUNITY): Payer: Self-pay | Admitting: Anesthesiology

## 2016-12-31 DIAGNOSIS — J45909 Unspecified asthma, uncomplicated: Secondary | ICD-10-CM | POA: Diagnosis present

## 2016-12-31 DIAGNOSIS — Z86718 Personal history of other venous thrombosis and embolism: Secondary | ICD-10-CM

## 2016-12-31 DIAGNOSIS — K219 Gastro-esophageal reflux disease without esophagitis: Secondary | ICD-10-CM | POA: Diagnosis present

## 2016-12-31 DIAGNOSIS — Z7901 Long term (current) use of anticoagulants: Secondary | ICD-10-CM | POA: Diagnosis not present

## 2016-12-31 DIAGNOSIS — M19012 Primary osteoarthritis, left shoulder: Secondary | ICD-10-CM | POA: Diagnosis present

## 2016-12-31 DIAGNOSIS — I10 Essential (primary) hypertension: Secondary | ICD-10-CM | POA: Diagnosis present

## 2016-12-31 DIAGNOSIS — M797 Fibromyalgia: Secondary | ICD-10-CM | POA: Diagnosis present

## 2016-12-31 DIAGNOSIS — G629 Polyneuropathy, unspecified: Secondary | ICD-10-CM | POA: Diagnosis present

## 2016-12-31 DIAGNOSIS — Z96651 Presence of right artificial knee joint: Secondary | ICD-10-CM | POA: Diagnosis present

## 2016-12-31 DIAGNOSIS — G8929 Other chronic pain: Secondary | ICD-10-CM | POA: Diagnosis present

## 2016-12-31 DIAGNOSIS — M75102 Unspecified rotator cuff tear or rupture of left shoulder, not specified as traumatic: Secondary | ICD-10-CM | POA: Diagnosis present

## 2016-12-31 DIAGNOSIS — F329 Major depressive disorder, single episode, unspecified: Secondary | ICD-10-CM | POA: Diagnosis present

## 2016-12-31 DIAGNOSIS — Z79899 Other long term (current) drug therapy: Secondary | ICD-10-CM

## 2016-12-31 DIAGNOSIS — Z96642 Presence of left artificial hip joint: Secondary | ICD-10-CM | POA: Diagnosis present

## 2016-12-31 DIAGNOSIS — Z96612 Presence of left artificial shoulder joint: Secondary | ICD-10-CM

## 2016-12-31 DIAGNOSIS — Z87891 Personal history of nicotine dependence: Secondary | ICD-10-CM

## 2016-12-31 HISTORY — PX: REVERSE SHOULDER ARTHROPLASTY: SHX5054

## 2016-12-31 SURGERY — ARTHROPLASTY, SHOULDER, TOTAL, REVERSE
Anesthesia: General | Site: Shoulder | Laterality: Left

## 2016-12-31 MED ORDER — PROMETHAZINE HCL 25 MG/ML IJ SOLN: 6.2500 mg | INTRAMUSCULAR | Status: DC | PRN

## 2016-12-31 MED ORDER — SUGAMMADEX SODIUM 200 MG/2ML IV SOLN
INTRAVENOUS | Status: DC | PRN
  Administered 2016-12-31: 150 mg via INTRAVENOUS

## 2016-12-31 MED ORDER — HYDROMORPHONE HCL 1 MG/ML IJ SOLN: 0.2500 mg | INTRAMUSCULAR | Status: DC | PRN

## 2016-12-31 MED ORDER — ONDANSETRON HCL 4 MG/2ML IJ SOLN: 4.0000 mg | Freq: Four times a day (QID) | INTRAMUSCULAR | Status: DC | PRN

## 2016-12-31 MED ORDER — ONDANSETRON HCL 4 MG/2ML IJ SOLN
INTRAMUSCULAR | Status: DC | PRN
  Administered 2016-12-31: 4 mg via INTRAVENOUS

## 2016-12-31 MED ORDER — CHLORHEXIDINE GLUCONATE 4 % EX LIQD: 60.0000 mL | Freq: Once | CUTANEOUS | Status: DC

## 2016-12-31 MED ORDER — LACTATED RINGERS IV SOLN
INTRAVENOUS | Status: DC
  Administered 2016-12-31: 10:00:00 via INTRAVENOUS

## 2016-12-31 MED ORDER — SODIUM CHLORIDE 0.9 % IV SOLN: INTRAVENOUS | Status: DC

## 2016-12-31 MED ORDER — ONDANSETRON HCL 4 MG/2ML IJ SOLN
INTRAMUSCULAR | Status: AC
  Filled 2016-12-31: qty 2

## 2016-12-31 MED ORDER — FENTANYL CITRATE (PF) 100 MCG/2ML IJ SOLN
INTRAMUSCULAR | Status: AC
  Administered 2016-12-31: 100 ug
  Filled 2016-12-31: qty 2

## 2016-12-31 MED ORDER — ONDANSETRON HCL 4 MG PO TABS: 4.0000 mg | ORAL_TABLET | Freq: Four times a day (QID) | ORAL | Status: DC | PRN

## 2016-12-31 MED ORDER — CEFAZOLIN SODIUM-DEXTROSE 2-4 GM/100ML-% IV SOLN
2.0000 g | INTRAVENOUS | Status: AC
  Administered 2016-12-31: 2 g via INTRAVENOUS
  Filled 2016-12-31: qty 100

## 2016-12-31 MED ORDER — IBUPROFEN 200 MG PO TABS
800.0000 mg | ORAL_TABLET | Freq: Four times a day (QID) | ORAL | Status: DC | PRN
Start: 2016-12-31 — End: 2016-12-31

## 2016-12-31 MED ORDER — 0.9 % SODIUM CHLORIDE (POUR BTL) OPTIME
TOPICAL | Status: DC | PRN
  Administered 2016-12-31: 1000 mL

## 2016-12-31 MED ORDER — KETOROLAC TROMETHAMINE 30 MG/ML IJ SOLN
INTRAMUSCULAR | Status: AC
  Administered 2016-12-31: 30 mg via INTRAVENOUS
  Filled 2016-12-31: qty 1

## 2016-12-31 MED ORDER — HYDROMORPHONE HCL 1 MG/ML IJ SOLN
INTRAMUSCULAR | Status: AC
  Administered 2016-12-31: 0.5 mg via INTRAVENOUS
  Filled 2016-12-31: qty 1

## 2016-12-31 MED ORDER — KETOROLAC TROMETHAMINE 30 MG/ML IJ SOLN
30.0000 mg | Freq: Once | INTRAMUSCULAR | Status: DC | PRN
  Administered 2016-12-31: 30 mg via INTRAVENOUS

## 2016-12-31 MED ORDER — METOCLOPRAMIDE HCL 5 MG/ML IJ SOLN: 5.0000 mg | Freq: Three times a day (TID) | INTRAMUSCULAR | Status: DC | PRN

## 2016-12-31 MED ORDER — SUGAMMADEX SODIUM 200 MG/2ML IV SOLN
INTRAVENOUS | Status: AC
  Filled 2016-12-31: qty 2

## 2016-12-31 MED ORDER — PHENOL 1.4 % MT LIQD: 1.0000 | OROMUCOSAL | Status: DC | PRN

## 2016-12-31 MED ORDER — GLYCOPYRROLATE 0.2 MG/ML IJ SOLN
INTRAMUSCULAR | Status: DC | PRN
  Administered 2016-12-31 (×2): 0.2 mg via INTRAVENOUS

## 2016-12-31 MED ORDER — OXYCODONE HCL 5 MG PO TABS
ORAL_TABLET | ORAL | Status: AC
Start: 2016-12-31 — End: 2017-01-01
  Filled 2016-12-31: qty 6

## 2016-12-31 MED ORDER — FENTANYL CITRATE (PF) 250 MCG/5ML IJ SOLN
INTRAMUSCULAR | Status: AC
  Filled 2016-12-31: qty 5

## 2016-12-31 MED ORDER — LIDOCAINE HCL (CARDIAC) 20 MG/ML IV SOLN: INTRAVENOUS | Status: DC | PRN

## 2016-12-31 MED ORDER — CARBOXYMETHYLCELLULOSE SODIUM 0.5 % OP SOLN: 1.0000 [drp] | OPHTHALMIC | Status: DC | PRN

## 2016-12-31 MED ORDER — BISACODYL 10 MG RE SUPP: 10.0000 mg | Freq: Every day | RECTAL | Status: DC | PRN

## 2016-12-31 MED ORDER — SERTRALINE HCL 50 MG PO TABS
100.0000 mg | ORAL_TABLET | Freq: Two times a day (BID) | ORAL | Status: DC
  Administered 2016-12-31 – 2017-01-01 (×2): 100 mg via ORAL
  Filled 2016-12-31 (×2): qty 2

## 2016-12-31 MED ORDER — PROPOFOL 10 MG/ML IV BOLUS
INTRAVENOUS | Status: AC
  Filled 2016-12-31: qty 20

## 2016-12-31 MED ORDER — BUPIVACAINE-EPINEPHRINE (PF) 0.25% -1:200000 IJ SOLN
INTRAMUSCULAR | Status: AC
  Filled 2016-12-31: qty 30

## 2016-12-31 MED ORDER — FLUTICASONE PROPIONATE 50 MCG/ACT NA SUSP
2.0000 | Freq: Every day | NASAL | Status: DC | PRN
Start: 2016-12-31 — End: 2017-01-01
  Filled 2016-12-31: qty 16

## 2016-12-31 MED ORDER — MIDAZOLAM HCL 2 MG/2ML IJ SOLN
INTRAMUSCULAR | Status: AC
  Administered 2016-12-31: 1 mg
  Filled 2016-12-31: qty 2

## 2016-12-31 MED ORDER — METOCLOPRAMIDE HCL 5 MG PO TABS: 5.0000 mg | ORAL_TABLET | Freq: Three times a day (TID) | ORAL | Status: DC | PRN

## 2016-12-31 MED ORDER — OXYCODONE HCL 30 MG PO TABS: 30.0000 mg | ORAL_TABLET | ORAL | 0 refills | Status: DC | PRN

## 2016-12-31 MED ORDER — TIZANIDINE HCL 4 MG PO TABS: 4.0000 mg | ORAL_TABLET | Freq: Three times a day (TID) | ORAL | 1 refills | Status: AC | PRN

## 2016-12-31 MED ORDER — ACETAMINOPHEN 650 MG RE SUPP: 650.0000 mg | RECTAL | Status: DC | PRN

## 2016-12-31 MED ORDER — EPHEDRINE 5 MG/ML INJ
INTRAVENOUS | Status: AC
  Filled 2016-12-31: qty 10

## 2016-12-31 MED ORDER — GABAPENTIN 300 MG PO CAPS
900.0000 mg | ORAL_CAPSULE | Freq: Every day | ORAL | Status: DC
  Administered 2016-12-31: 900 mg via ORAL
  Filled 2016-12-31: qty 3

## 2016-12-31 MED ORDER — PHENYLEPHRINE 40 MCG/ML (10ML) SYRINGE FOR IV PUSH (FOR BLOOD PRESSURE SUPPORT)
PREFILLED_SYRINGE | INTRAVENOUS | Status: AC
  Filled 2016-12-31: qty 10

## 2016-12-31 MED ORDER — PROPOFOL 10 MG/ML IV BOLUS
INTRAVENOUS | Status: DC | PRN
  Administered 2016-12-31: 120 mg via INTRAVENOUS

## 2016-12-31 MED ORDER — CEFAZOLIN SODIUM-DEXTROSE 2-4 GM/100ML-% IV SOLN
2.0000 g | Freq: Four times a day (QID) | INTRAVENOUS | Status: AC
  Administered 2016-12-31 (×2): 2 g via INTRAVENOUS
  Filled 2016-12-31 (×2): qty 100

## 2016-12-31 MED ORDER — ROPIVACAINE HCL 7.5 MG/ML IJ SOLN
INTRAMUSCULAR | Status: DC | PRN
  Administered 2016-12-31 (×6): 5 mL via PERINEURAL

## 2016-12-31 MED ORDER — BUPIVACAINE-EPINEPHRINE 0.25% -1:200000 IJ SOLN
INTRAMUSCULAR | Status: DC | PRN
  Administered 2016-12-31: 20 mL

## 2016-12-31 MED ORDER — DOCUSATE SODIUM 100 MG PO CAPS
100.0000 mg | ORAL_CAPSULE | Freq: Two times a day (BID) | ORAL | Status: DC
  Administered 2016-12-31 – 2017-01-01 (×2): 100 mg via ORAL
  Filled 2016-12-31 (×2): qty 1

## 2016-12-31 MED ORDER — MENTHOL 3 MG MT LOZG: 1.0000 | LOZENGE | OROMUCOSAL | Status: DC | PRN

## 2016-12-31 MED ORDER — HYDROMORPHONE HCL 1 MG/ML IJ SOLN
0.2500 mg | INTRAMUSCULAR | Status: DC | PRN
  Administered 2016-12-31 (×4): 0.5 mg via INTRAVENOUS

## 2016-12-31 MED ORDER — MEPERIDINE HCL 25 MG/ML IJ SOLN: 6.2500 mg | INTRAMUSCULAR | Status: DC | PRN

## 2016-12-31 MED ORDER — PHENYLEPHRINE HCL 10 MG/ML IJ SOLN
INTRAVENOUS | Status: DC | PRN
  Administered 2016-12-31: 50 ug/min via INTRAVENOUS

## 2016-12-31 MED ORDER — TIZANIDINE HCL 4 MG PO TABS: 4.0000 mg | ORAL_TABLET | Freq: Four times a day (QID) | ORAL | Status: DC | PRN

## 2016-12-31 MED ORDER — ROCURONIUM BROMIDE 100 MG/10ML IV SOLN
INTRAVENOUS | Status: DC | PRN
  Administered 2016-12-31: 30 mg via INTRAVENOUS

## 2016-12-31 MED ORDER — ACETAMINOPHEN 325 MG PO TABS: 650.0000 mg | ORAL_TABLET | ORAL | Status: DC | PRN

## 2016-12-31 MED ORDER — METOPROLOL TARTRATE 12.5 MG HALF TABLET
12.5000 mg | ORAL_TABLET | Freq: Two times a day (BID) | ORAL | Status: DC
  Administered 2016-12-31 – 2017-01-01 (×2): 12.5 mg via ORAL
  Filled 2016-12-31 (×2): qty 1

## 2016-12-31 MED ORDER — DEXAMETHASONE SODIUM PHOSPHATE 10 MG/ML IJ SOLN
INTRAMUSCULAR | Status: AC
  Filled 2016-12-31: qty 1

## 2016-12-31 MED ORDER — POLYETHYLENE GLYCOL 3350 17 G PO PACK: 17.0000 g | PACK | Freq: Every day | ORAL | Status: DC | PRN

## 2016-12-31 MED ORDER — OXYCODONE HCL 5 MG PO TABS
30.0000 mg | ORAL_TABLET | ORAL | Status: DC | PRN
  Administered 2016-12-31 – 2017-01-01 (×5): 30 mg via ORAL
  Filled 2016-12-31 (×4): qty 6

## 2016-12-31 MED ORDER — EPHEDRINE SULFATE-NACL 50-0.9 MG/10ML-% IV SOSY
PREFILLED_SYRINGE | INTRAVENOUS | Status: DC | PRN
  Administered 2016-12-31: 10 mg via INTRAVENOUS

## 2016-12-31 MED ORDER — ALPRAZOLAM 0.5 MG PO TABS
0.5000 mg | ORAL_TABLET | Freq: Three times a day (TID) | ORAL | Status: DC | PRN
  Administered 2017-01-01: 1 mg via ORAL
  Administered 2017-01-01: 0.5 mg via ORAL
  Filled 2016-12-31: qty 2
  Filled 2016-12-31: qty 1

## 2016-12-31 MED ORDER — GABAPENTIN 300 MG PO CAPS
300.0000 mg | ORAL_CAPSULE | Freq: Two times a day (BID) | ORAL | Status: DC
  Administered 2017-01-01: 300 mg via ORAL
  Filled 2016-12-31: qty 1

## 2016-12-31 MED ORDER — VITAMIN D 1000 UNITS PO TABS
5000.0000 [IU] | ORAL_TABLET | Freq: Two times a day (BID) | ORAL | Status: DC
  Administered 2016-12-31 – 2017-01-01 (×2): 5000 [IU] via ORAL
  Filled 2016-12-31 (×2): qty 5

## 2016-12-31 MED ORDER — ROCURONIUM BROMIDE 10 MG/ML (PF) SYRINGE
PREFILLED_SYRINGE | INTRAVENOUS | Status: AC
  Filled 2016-12-31: qty 5

## 2016-12-31 MED ORDER — DEXAMETHASONE SODIUM PHOSPHATE 10 MG/ML IJ SOLN
INTRAMUSCULAR | Status: DC | PRN
  Administered 2016-12-31: 10 mg via INTRAVENOUS

## 2016-12-31 MED ORDER — TORSEMIDE 10 MG PO TABS: 10.0000 mg | ORAL_TABLET | Freq: Every day | ORAL | Status: DC | PRN

## 2016-12-31 SURGICAL SUPPLY — 72 items
BIT DRILL 170X2.5X (BIT) IMPLANT
BIT DRILL 5/64X5 DISP (BIT) ×3 IMPLANT
BIT DRL 170X2.5X (BIT)
BLADE SAG 18X100X1.27 (BLADE) ×3 IMPLANT
CAPT SHLDR REVTOTAL 1 ×2 IMPLANT
CEMENT HV SMART SET (Cement) ×2 IMPLANT
CLOSURE STERI-STRIP 1/2X4 (GAUZE/BANDAGES/DRESSINGS) ×1
CLOSURE WOUND 1/2 X4 (GAUZE/BANDAGES/DRESSINGS) ×1
CLSR STERI-STRIP ANTIMIC 1/2X4 (GAUZE/BANDAGES/DRESSINGS) ×1 IMPLANT
COVER SURGICAL LIGHT HANDLE (MISCELLANEOUS) ×3 IMPLANT
DRAPE IMP U-DRAPE 54X76 (DRAPES) ×2 IMPLANT
DRAPE INCISE IOBAN 66X45 STRL (DRAPES) ×3 IMPLANT
DRAPE ORTHO SPLIT 77X108 STRL (DRAPES) ×6
DRAPE SURG ORHT 6 SPLT 77X108 (DRAPES) ×2 IMPLANT
DRAPE U-SHAPE 47X51 STRL (DRAPES) ×3 IMPLANT
DRILL 2.5 (BIT)
DRSG ADAPTIC 3X8 NADH LF (GAUZE/BANDAGES/DRESSINGS) ×3 IMPLANT
DRSG PAD ABDOMINAL 8X10 ST (GAUZE/BANDAGES/DRESSINGS) ×3 IMPLANT
DURAPREP 26ML APPLICATOR (WOUND CARE) ×3 IMPLANT
ELECT BLADE 4.0 EZ CLEAN MEGAD (MISCELLANEOUS) ×3
ELECT NDL TIP 2.8 STRL (NEEDLE) ×1 IMPLANT
ELECT NEEDLE TIP 2.8 STRL (NEEDLE) ×3 IMPLANT
ELECT REM PT RETURN 9FT ADLT (ELECTROSURGICAL) ×3
ELECTRODE BLDE 4.0 EZ CLN MEGD (MISCELLANEOUS) ×1 IMPLANT
ELECTRODE REM PT RTRN 9FT ADLT (ELECTROSURGICAL) ×1 IMPLANT
GAUZE SPONGE 4X4 12PLY STRL (GAUZE/BANDAGES/DRESSINGS) ×3 IMPLANT
GLOVE BIOGEL PI IND STRL 7.0 (GLOVE) IMPLANT
GLOVE BIOGEL PI INDICATOR 7.0 (GLOVE) ×2
GLOVE BIOGEL PI ORTHO PRO 7.5 (GLOVE) ×2
GLOVE BIOGEL PI ORTHO PRO SZ8 (GLOVE) ×2
GLOVE ORTHO TXT STRL SZ7.5 (GLOVE) ×3 IMPLANT
GLOVE PI ORTHO PRO STRL 7.5 (GLOVE) ×1 IMPLANT
GLOVE PI ORTHO PRO STRL SZ8 (GLOVE) ×1 IMPLANT
GLOVE SURG ORTHO 8.5 STRL (GLOVE) ×3 IMPLANT
GOWN STRL REUS W/ TWL LRG LVL3 (GOWN DISPOSABLE) ×1 IMPLANT
GOWN STRL REUS W/ TWL XL LVL3 (GOWN DISPOSABLE) ×2 IMPLANT
GOWN STRL REUS W/TWL LRG LVL3 (GOWN DISPOSABLE) ×3
GOWN STRL REUS W/TWL XL LVL3 (GOWN DISPOSABLE) ×9
KIT BASIN OR (CUSTOM PROCEDURE TRAY) ×3 IMPLANT
KIT ROOM TURNOVER OR (KITS) ×3 IMPLANT
MANIFOLD NEPTUNE II (INSTRUMENTS) ×3 IMPLANT
NDL 1/2 CIR MAYO (NEEDLE) ×1 IMPLANT
NDL HYPO 25GX1X1/2 BEV (NEEDLE) ×1 IMPLANT
NEEDLE 1/2 CIR MAYO (NEEDLE) ×3 IMPLANT
NEEDLE HYPO 25GX1X1/2 BEV (NEEDLE) ×3 IMPLANT
NS IRRIG 1000ML POUR BTL (IV SOLUTION) ×3 IMPLANT
PACK SHOULDER (CUSTOM PROCEDURE TRAY) ×3 IMPLANT
PAD ABD 8X10 STRL (GAUZE/BANDAGES/DRESSINGS) ×4 IMPLANT
PAD ARMBOARD 7.5X6 YLW CONV (MISCELLANEOUS) ×6 IMPLANT
PIN GUIDE 1.2 (PIN) IMPLANT
PIN GUIDE GLENOPHERE 1.5MX300M (PIN) IMPLANT
PIN METAGLENE 2.5 (PIN) IMPLANT
SLING ARM FOAM STRAP MED (SOFTGOODS) ×2 IMPLANT
SLING ARM IMMOBILIZER LRG (SOFTGOODS) ×2 IMPLANT
SPONGE LAP 18X18 X RAY DECT (DISPOSABLE) IMPLANT
SPONGE LAP 4X18 X RAY DECT (DISPOSABLE) ×3 IMPLANT
STRIP CLOSURE SKIN 1/2X4 (GAUZE/BANDAGES/DRESSINGS) ×2 IMPLANT
SUCTION FRAZIER HANDLE 10FR (MISCELLANEOUS) ×2
SUCTION TUBE FRAZIER 10FR DISP (MISCELLANEOUS) ×1 IMPLANT
SUT FIBERWIRE #2 38 T-5 BLUE (SUTURE) ×6
SUT MNCRL AB 4-0 PS2 18 (SUTURE) ×3 IMPLANT
SUT VIC AB 0 CT2 27 (SUTURE) ×3 IMPLANT
SUT VIC AB 2-0 CT1 27 (SUTURE) ×3
SUT VIC AB 2-0 CT1 TAPERPNT 27 (SUTURE) ×1 IMPLANT
SUT VICRYL 0 CT 1 36IN (SUTURE) ×3 IMPLANT
SUTURE FIBERWR #2 38 T-5 BLUE (SUTURE) IMPLANT
SYR CONTROL 10ML LL (SYRINGE) ×3 IMPLANT
TAPE CLOTH SURG 6X10 WHT LF (GAUZE/BANDAGES/DRESSINGS) ×2 IMPLANT
TOWEL OR 17X24 6PK STRL BLUE (TOWEL DISPOSABLE) ×3 IMPLANT
TOWEL OR 17X26 10 PK STRL BLUE (TOWEL DISPOSABLE) ×3 IMPLANT
TOWER CARTRIDGE SMART MIX (DISPOSABLE) IMPLANT
YANKAUER SUCT BULB TIP NO VENT (SUCTIONS) ×3 IMPLANT

## 2016-12-31 NOTE — Discharge Instructions (Signed)
Ice to the shoulder as much as possible.  Keep the incision covered and dry for one week, then ok to shower and get the wound wet.  Do gentle exercises three times per day. Ok to use the sling as needed for support, but may remove and do light daily activity.  Follow up in two weeks in the office. [phone]

## 2016-12-31 NOTE — Anesthesia Preprocedure Evaluation (Addendum)
Anesthesia Evaluation  Patient identified by MRN, date of birth, ID band Patient awake    Reviewed: Allergy & Precautions, NPO status , Patient's Chart, lab work & pertinent test results  History of Anesthesia Complications Negative for: history of anesthetic complications  Airway Mallampati: II  TM Distance: >3 FB Neck ROM: Full    Dental  (+) Dental Advisory Given, Caps   Pulmonary COPD, former smoker,    Pulmonary exam normal breath sounds clear to auscultation       Cardiovascular hypertension, Pt. on home beta blockers (-) angina+ DVT  Normal cardiovascular exam+ dysrhythmias Supra Ventricular Tachycardia  Rhythm:Regular Rate:Normal  1/18 ECHO: EF 65-70%, mild-mod TR   Neuro/Psych  Headaches, Depression    GI/Hepatic Neg liver ROS, GERD  Controlled,  Endo/Other  negative endocrine ROS  Renal/GU negative Renal ROS     Musculoskeletal  (+) Arthritis , Fibromyalgia -  Abdominal Normal abdominal exam  (+)   Peds  Hematology  (+) Blood dyscrasia (Hb 10.1), anemia ,   Anesthesia Other Findings  Pt is a 76 year old female scheduled for reverse left shoulder arthroplasty on 12/31/2016 with Netta Cedars, M.D.  - PCP is Ardith Dark, PA (notes in care everywhere)  - Cardiologist is Glenetta Hew, MD who cleared pt for surgery at low risk (see telephone encounter 11/18/16 by Trixie Dredge, RN)  PMH includes: PAT, HTN, heart murmur, anemia, DVT (2014 after surgery), GERD. Former smoker. BMI 28. S/p L THA 08/18/16. S/p ORIF L elbow 04/15/16.   - Hospitalized 1/24-31/18 for acute renal failure, acute encephalopathy, fx L olecranon process  Medications include: Metoprolol, torsemide  Preoperative labs reviewed.    1 view CXR 03/18/16:  - Stable cardiomegaly. No acute pulmonary disease. Aortic atherosclerosis.  EKG 07/14/16: Sinus rhythm with PACs  Echo 03/18/16:  - Left ventricle: The cavity size was  normal. There was moderateconcentric hypertrophy. Systolic function was vigorous. Theestimated ejection fraction was in the range of 65% to 70%. Wallmotion was normal; there were no regional wall motion abnormalities. Doppler parameters are consistent with abnormalleft ventricular relaxation (grade 1 diastolic dysfunction). - Aortic valve: Trileaflet; normal thickness, mildly calcifiedleaflets. Valve area (VTI): 1.86 cm^2. Valve area (Vmax): 1.92cm^2. Valve area (Vmean): 1.83 cm^2. - Mitral valve: Valve area by continuity equation (using LVOTflow): 2.15 cm^2. - Left atrium: The atrium was severely dilated. - Tricuspid valve: There was mild-moderate regurgitation. - Pulmonary arteries: Systolic pressure was mildly to moderatelyincreased. PA peak pressure: 44 mm Hg (S).  If no changes, I anticipate pt can proceed with surgery as scheduled.   Willeen Cass, FNP-BC [facility]/Anesthesiology Phone: [phone] 12/30/2016 10:15 AM      Shirley [name]  ECHO COMPLETE WO IMAGING ENHANCING AGENT  Order# [id number]  Reading physician: Jacolyn Reedy, MD Ordering physician: Rise Patience, MD Study date: 03/18/16 Study Result   Result status: Final result                             *[facility]*                         [address],  [place] [address]                            [phone]  ------------------------------------------------------------------- Transthoracic Echocardiography  Patient:    Shirley [name], Shirley [name] MR #:       [id number] Study Date: 03/18/2016 Gender:     F Age:        77 Height:     162.6 cm Weight:     81.6 kg BSA:        1.95 m^2 Pt. Status: Room:       [place], MD [place], [place], [place], [place]  SONOGRAPHER  Mikki Santee  cc:  ------------------------------------------------------------------- LV EF: 65% -   70%  ------------------------------------------------------------------- Indications:      CVA 436.  ------------------------------------------------------------------- History:   Risk factors:  Current tobacco use. Hypertension.  ------------------------------------------------------------------- Study Conclusions  - Left ventricle: The cavity size was normal. There was moderate   concentric hypertrophy. Systolic function was vigorous. The   estimated ejection fraction was in the range of 65% to 70%. Wall   motion was normal; there were no regional wall motion   abnormalities. Doppler parameters are consistent with abnormal   left ventricular relaxation (grade 1 diastolic dysfunction). - Aortic valve: Trileaflet; normal thickness, mildly calcified   leaflets. Valve area (VTI): 1.86 cm^2. Valve area (Vmax): 1.92   cm^2. Valve area (Vmean): 1.83 cm^2. - Mitral valve: Valve area by continuity equation (using LVOT   flow): 2.15 cm^2. - Left atrium: The atrium was severely dilated. - Tricuspid valve: There was mild-moderate regurgitation. - Pulmonary arteries: Systolic pressure was mildly to moderately   increased. PA peak pressure: 44 mm Hg (S).  Impressions:  - No cardiac source of emboli was indentified.  ------------------------------------------------------------------- Study data:  Comparison was made to the study of 08/31/2012.  Study status:  Routine.  Procedure:  The patient reported no pain pre or post test. Transthoracic echocardiography. Image quality was adequate.  Study completion:  There were no complications. Transthoracic echocardiography.  M-mode, complete 2D, spectral Doppler, and color Doppler.  Birthdate:  Patient birthdate: 28-Nov-1940.  Age:  Patient is 76 yr old.  Sex:  Gender: female. BMI: 30.9 kg/m^2.  Blood pressure:     113/62  Patient  status: Inpatient.  Study date:  Study date: 03/18/2016. Study time: 01:52 PM.  Location:  Echo laboratory.  -------------------------------------------------------------------  ------------------------------------------------------------------- Left ventricle:  The cavity size was normal. There was moderate concentric hypertrophy. Systolic function was vigorous. The estimated ejection fraction was in the range of 65% to 70%. Wall motion was normal; there were no regional wall motion abnormalities. Doppler parameters are consistent with abnormal left ventricular relaxation (grade 1 diastolic dysfunction).  ------------------------------------------------------------------- Aortic valve:   Trileaflet; normal thickness, mildly calcified leaflets. Mobility was not restricted.  Doppler:  Transvalvular velocity was within the normal range. There was no stenosis. There was no regurgitation.    VTI ratio of LVOT to aortic valve: 0.59. Valve area (VTI): 1.86 cm^2. Indexed valve area (VTI): 0.95 cm^2/m^2. Peak velocity ratio of LVOT to aortic valve: 0.61. Valve area (Vmax): 1.92 cm^2. Indexed valve area (Vmax): 0.99 cm^2/m^2. Mean velocity ratio of LVOT to aortic valve: 0.58. Valve area (Vmean): 1.83 cm^2. Indexed valve area (Vmean): 0.94 cm^2/m^2. Mean gradient (S): 17 mm Hg. Peak gradient (  S): 30 mm Hg.  ------------------------------------------------------------------- Aorta:  Aortic root: The aortic root was normal in size.  ------------------------------------------------------------------- Mitral valve:   Structurally normal valve.   Mobility was not restricted.  Doppler:  Transvalvular velocity was within the normal range. There was no evidence for stenosis. There was no regurgitation.    Valve area by continuity equation (using LVOT flow): 2.15 cm^2. Indexed valve area by continuity equation (using LVOT flow): 1.1 cm^2/m^2.    Mean gradient (D): 7 mm Hg. Peak gradient (D): 7  mm Hg.  ------------------------------------------------------------------- Left atrium:  The atrium was severely dilated.  ------------------------------------------------------------------- Right ventricle:  The cavity size was normal. Wall thickness was normal. Systolic function was normal.  ------------------------------------------------------------------- Pulmonic valve:    Doppler:  Transvalvular velocity was within the normal range. There was no evidence for stenosis.  ------------------------------------------------------------------- Tricuspid valve:   Structurally normal valve.    Doppler: Transvalvular velocity was within the normal range. There was mild-moderate regurgitation.  ------------------------------------------------------------------- Pulmonary artery:   The main pulmonary artery was normal-sized. Systolic pressure was mildly to moderately increased.  ------------------------------------------------------------------- Right atrium:  The atrium was normal in size.  ------------------------------------------------------------------- Pericardium:  There was no pericardial effusion.  ------------------------------------------------------------------- Systemic veins: Inferior vena cava: Not well visualized. The vessel was dilated. The respirophasic diameter changes were in the normal range (>= 50%).  ------------------------------------------------------------------- Measurements   Left ventricle                           Value          Reference  LV ID, ED, PLAX chordal                  44.9  mm       43 - 52  LV ID, ES, PLAX chordal                  27.6  mm       23 - 38  LV fx shortening, PLAX chordal           39    %        >=29  LV PW thickness, ED                      13.4  mm       ----------  IVS/LV PW ratio, ED                      1.07           <=1.3  Stroke volume, 2D                        122   ml       ----------  Stroke  volume/bsa, 2D                    63    ml/m^2   ----------  LV e&', lateral                           8.27  cm/s     ----------  LV E/e&', lateral                         16.44          ----------  LV s&', lateral  24.1  cm/s     ----------  LV e&', medial                            4.79  cm/s     ----------  LV E/e&', medial                          28.39          ----------  LV e&', average                           6.53  cm/s     ----------  LV E/e&', average                         20.83          ----------    Ventricular septum                       Value          Reference  IVS thickness, ED                        14.3  mm       ----------    LVOT                                     Value          Reference  LVOT ID, S                               20    mm       ----------  LVOT area                                3.14  cm^2     ----------  LVOT peak velocity, S                    166   cm/s     ----------  LVOT mean velocity, S                    111   cm/s     ----------  LVOT VTI, S                              38.8  cm       ----------  LVOT peak gradient, S                    11    mm Hg    ----------    Aortic valve                             Value          Reference  Aortic valve peak velocity, S            272   cm/s     ----------  Aortic valve mean velocity, S  190   cm/s     ----------  Aortic valve VTI, S                      65.5  cm       ----------  Aortic mean gradient, S                  17    mm Hg    ----------  Aortic peak gradient, S                  30    mm Hg    ----------  VTI ratio, LVOT/AV                       0.59           ----------  Aortic valve area, VTI                   1.86  cm^2     ----------  Aortic valve area/bsa, VTI               0.95  cm^2/m^2 ----------  Velocity ratio, peak, LVOT/AV            0.61           ----------  Aortic valve area, peak velocity         1.92  cm^2     ----------  Aortic  valve area/bsa, peak              0.99  cm^2/m^2 ----------  velocity  Velocity ratio, mean, LVOT/AV            0.58           ----------  Aortic valve area, mean velocity         1.83  cm^2     ----------  Aortic valve area/bsa, mean              0.94  cm^2/m^2 ----------  velocity    Aorta                                    Value          Reference  Aortic root ID, ED                       30    mm       ----------    Left atrium                              Value          Reference  LA ID, A-P, ES                           36    mm       ----------  LA ID/bsa, A-P                           1.85  cm/m^2   <=2.2  LA volume, S                             104   ml       ----------  LA volume/bsa, S                         53.4  ml/m^2   ----------  LA volume, ES, 1-p A4C                   101   ml       ----------  LA volume/bsa, ES, 1-p A4C               51.8  ml/m^2   ----------  LA volume, ES, 1-p A2C                   97.4  ml       ----------  LA volume/bsa, ES, 1-p A2C               50    ml/m^2   ----------    Mitral valve                             Value          Reference  Mitral E-wave peak velocity              136   cm/s     ----------  Mitral A-wave peak velocity              166   cm/s     ----------  Mitral mean velocity, D                  127   cm/s     ----------  Mitral deceleration time         (H)     310   ms       150 - 230  Mitral mean gradient, D                  7     mm Hg    ----------  Mitral peak gradient, D                  7     mm Hg    ----------  Mitral E/A ratio, peak                   0.8            ----------  Mitral valve area, LVOT                  2.15  cm^2     ----------  continuity  Mitral valve area/bsa, LVOT              1.1   cm^2/m^2 ----------  continuity  Mitral annulus VTI, D                    56.7  cm       ----------    Pulmonary arteries                       Value          Reference  PA pressure, S, DP               (H)     44     mm Hg    <=30    Tricuspid valve  Value          Reference  Tricuspid regurg peak velocity           299   cm/s     ----------  Tricuspid peak RV-RA gradient            36    mm Hg    ----------    Right atrium                             Value          Reference  RA ID, S-I, ES, A4C              (H)     50.3  mm       34 - 49  RA area, ES, A4C                 (H)     20.2  cm^2     8.3 - 19.5  RA volume, ES, A/L                       64.8  ml       ----------  RA volume/bsa, ES, A/L                   33.3  ml/m^2   ----------    Systemic veins                           Value          Reference  Estimated CVP                            8     mm Hg    ----------    Right ventricle                          Value          Reference  TAPSE                                    25.2  mm       ----------  RV pressure, S, DP               (H)     44    mm Hg    <=30  RV s&', lateral, S                        24.1  cm/s     ----------  Legend: (L)  and  (H)  mark values outside specified reference range.  ------------------------------------------------------------------- Prepared and Electronically Authenticated by  Ezzard Standing, MD [facility] 2018-01-25T18:46:50 [facility] Images   Show images for Echocardiogram Patient Information   Patient Name Shirley [name], Shirley [name] Sex Female DOB 10/24/1940 SSN [id number] Reason For Exam  Priority: Routine  Not on file Surgical History   Surgical History    No past medical history on file.  Other Surgical History    Procedure Laterality Date Comment Source ABDOMINAL HYSTERECTOMY  1999 BSO for endometriosis Provider APPENDECTOMY    Provider BACK SURGERY  age 30 ruptured disk L3-4  Provider CATARACT EXTRACTION  2000 B/L  Provider COLONOSCOPY    Provider FINGER SURGERY   left finger for tender rupture from fall. Provider FOOT SURGERY   multiple(at least 4 on right, 5 on left)-left Dr.Kerner()11/08,left  Dr.Nunley-excision 2nd and 3rd mt heads w/artho and hammertoe surgery 4th and 5th 04/2006. left great toe IP fusion and revision of fusion of 2nd through 5th toes 12/2005. Right foot surgeries Dr.Bednarz 04/2008 and 04/2009. Provider KNEE SURGERY Right  right knee replacement Provider NECK SURGERY  2007 cervical Provider ORIF ELBOW FRACTURE Left 03/22/2016 Procedure: OPEN REDUCTION INTERNAL FIXATION (ORIF) ELBOW/OLECRANON FRACTURE; Surgeon: Altamese St. Leo, MD; Location: [place]; Service: Orthopedics; Laterality: Left; Provider ORIF ELBOW FRACTURE Left 04/15/2016 Procedure: OPEN REDUCTION INTERNAL FIXATION (ORIF) LEFT ELBOW/OLECRANON FRACTURE; Surgeon: Altamese Tolleson, MD; Location: [place]; Service: Orthopedics; Laterality: Left; Provider RADIOLOGY WITH ANESTHESIA Left 01/06/2016 Procedure: MRI LEFT HIP WITH AND WITHOUT; Surgeon: Medication Radiologist, MD; Location: [place]; Service: Radiology; Laterality: Left; Provider REFRACTIVE SURGERY  2007 left eye Provider SHOULDER SURGERY   x8 (4 on rt side and 4 on left side) Provider SPINAL FUSION  6/09 Dr.Cohen Provider STERIOD INJECTION Left 04/15/2016 Procedure: LEFT HIP STEROID INJECTION; Surgeon: Altamese Fairview, MD; Location: [place]; Service: Orthopedics; Laterality: Left; Provider TONSILLECTOMY AND ADENOIDECTOMY    Provider TOTAL HIP ARTHROPLASTY Left 08/18/2016 Procedure: LEFT TOTAL HIP ARTHROPLASTY ANTERIOR APPROACH; Surgeon: Gaynelle Arabian, MD; Location: [facility]; Service: Orthopedics; Laterality: Left; Provider TRANSTHORACIC ECHOCARDIOGRAM  July 2012 Normal EF 60-65% , mild concentric LVH. Grade 2 diastolic dysfunction with elevated EDP. Massive left atrial dilation. Pulmonary pressures 30-40 mm Hg; aortic sclerosis but no stenosis. Moderate TR Provider TRANSTHORACIC ECHOCARDIOGRAM  03/2016 EF 65-70%. Normal wall motion. GR 1 DD. Severe LA dilation. Mild to moderate TR with mild to moderately elevated PA pressures (44 mmHg) mild aortic  calcification but no stenosis. No source of emboli  Provider WRIST SURGERY Left  2 surgeries left wrist after fracture(complicated by DVT) Provider  Patient Data   BP  113/62 mmHg    Performing Technologist/Nurse   Performing Technologist/Nurse: Jennette Dubin, RDCS          Implants     Implant Capt Hip Total 2 - [id number] - Capitated Charge Entry  (Left) Hip    Inventory item: Capt Hip Total 2 Model/Cat number: [id number] Manufacturer: [employer]PU[employer]   As of 08/18/2016     Status: Capitated Charge Entry    Order-Level Documents - 03/17/2016:   Scan on 03/18/2016 1:57 PM by Default, Provider, MDScan on 03/18/2016 1:57 PM by Default, Provider, MD    Encounter-Level Documents - 03/17/2016:   Scan on 03/25/2016 2:58 PM by Default, Provider, MDScan on 03/25/2016 2:58 PM by Default, Provider, MD  Scan on 03/25/2016 2:54 PM by Default, Provider, MDScan on 03/25/2016 2:54 PM by Default, Provider, MD  Document on 03/24/2016 2:28 PM by Cyndia Bent, RN: IP After Visit Summary  Document on 03/22/2016 3:46 PM by Domenick Gong: ED PB Summary  Document on 03/22/2016 3:46 PM by Domenick Gong: ED Encounter Summary  Scan on 03/24/2016 9:06 AM by Default, Provider, MDScan on 03/24/2016 9:06 AM by Default, Provider, MD  Scan on 03/22/2016 5:45 PM by Default, Provider, MDScan on 03/22/2016 5:45 PM by Default, Provider, MD  Scan on 03/18/2016 2:48 AM by Darlyn Chamber on 03/18/2016 2:48 AM by Dia Crawford  Electronic signature on 03/18/2016 2:22 AM  Scan on 03/18/2016 3:04 AM by Default, Provider, MDScan on 03/18/2016 3:04 AM by Default, Provider, MD    Signed  Electronically signed by Jacolyn Reedy, MD on 03/18/16 at [place] EST Printable Result Report    Result Report  External Result Report    External Result Report      Reproductive/Obstetrics                            Anesthesia Physical  Anesthesia Plan  ASA:  III  Anesthesia Plan: General   Post-op Pain Management:  Regional for Post-op pain   Induction: Intravenous  PONV Risk Score and Plan: 3 and Ondansetron, Dexamethasone, Treatment may vary due to age or medical condition and Midazolam  Airway Management Planned: Oral ETT  Additional Equipment:   Intra-op Plan:   Post-operative Plan: Extubation in OR  Informed Consent: I have reviewed the patients History and Physical, chart, labs and discussed the procedure including the risks, benefits and alternatives for the proposed anesthesia with the patient or authorized representative who has indicated his/her understanding and acceptance.   Dental advisory given  Plan Discussed with: CRNA and Surgeon  Anesthesia Plan Comments:       Anesthesia Quick Evaluation

## 2016-12-31 NOTE — Interval H&P Note (Signed)
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 After consideration of risks, benefits and other options for treatment, the patient has consented to  Procedure(s): REVERSE LEFT SHOULDER ARTHROPLASTY (Left) as a surgical intervention .  The patient's history has been reviewed, patient examined, no change in status, stable for surgery.  I have reviewed the patient's chart and labs.  Questions were answered to the patient's satisfaction.     Bladen Umar,STEVEN R

## 2016-12-31 NOTE — Anesthesia Procedure Notes (Addendum)
Anesthesia Regional Block: Interscalene brachial plexus block   Pre-Anesthetic Checklist: ,, timeout performed, Correct Patient, Correct Site, Correct Laterality, Correct Procedure, Correct Position, site marked, Risks and benefits discussed,  Surgical consent,  Pre-op evaluation,  At surgeon's request and post-op pain management  Laterality: Left and Upper  Prep: chloraprep       Needles:  Injection technique: Single-shot  Needle Type: Echogenic Stimulator Needle     Needle Length: 9cm  Needle Gauge: 21   Needle insertion depth: 2 cm   Additional Needles:   Procedures:,,,, ultrasound used (permanent image in chart),,,,  Narrative:  Start time: 12/31/2016 9:40 AM End time: 12/31/2016 9:48 AM Injection made incrementally with aspirations every 5 mL.  Performed by: Personally  Anesthesiologist: Lyn Hollingshead, MD

## 2016-12-31 NOTE — Anesthesia Postprocedure Evaluation (Signed)
Anesthesia Post Note  Patient: Shirley [name]  Procedure(s) Performed: REVERSE LEFT SHOULDER ARTHROPLASTY (Left Shoulder)     Patient location during evaluation: PACU Anesthesia Type: General Level of consciousness: awake Pain management: pain level controlled Vital Signs Assessment: post-procedure vital signs reviewed and stable Respiratory status: spontaneous breathing Cardiovascular status: stable Postop Assessment: no apparent nausea or vomiting Anesthetic complications: no    Last Vitals:  Vitals:   12/31/16 1345 12/31/16 1400  BP: (!) 187/95 (!) 163/75  Pulse: 61 (!) 58  Resp: (!) 25 13  Temp:    SpO2: 98% 97%    Last Pain:  Vitals:   12/31/16 1345  TempSrc:   PainSc: 7    Pain Goal:                 Gwendolynn Merkey JR,JOHN Tuwanda Vokes

## 2016-12-31 NOTE — Brief Op Note (Signed)
12/31/2016  12:54 PM  PATIENT:  Shirley [name]  76 y.o. 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  PROCEDURE:  Procedure(s): REVERSE LEFT SHOULDER ARTHROPLASTY (Left) DePuy Delta Xtend  SURGEON:  Surgeon(s) and Role:    Netta Cedars, MD - Primary  PHYSICIAN ASSISTANT:   ASSISTANTS: Ventura Bruns, PA-C   ANESTHESIA:   regional and general  EBL:  150 mL   BLOOD ADMINISTERED:none  DRAINS: none   LOCAL MEDICATIONS USED:  MARCAINE     SPECIMEN:  No Specimen and Mastectomy with SLN  DISPOSITION OF SPECIMEN:  N/A  COUNTS:  YES  TOURNIQUET:  * No tourniquets in log *  DICTATION: .Other Dictation: Dictation Number [id number]  PLAN OF CARE: Admit to inpatient   PATIENT DISPOSITION:  PACU - hemodynamically stable.   Delay start of Pharmacological VTE agent (>24hrs) due to surgical blood loss or risk of bleeding: not applicable

## 2016-12-31 NOTE — Evaluation (Signed)
Physical Therapy Evaluation Patient Details Name: Shirley [name] MRN: [id number] DOB: 09/07/1940 Today's Date: 12/31/2016   History of Present Illness  Pt is a 76 y/o female s/p L reverse total shoulder arthroplasty. PMH includes HTN, fibromyalgia, falls, DVT, L olecranon fx s/p ORIF, L THA, R knee surgery, and back surgery.   Clinical Impression  Pt is s/p surgery above with deficits below. PTA, pt was using cane for ambulation. Upon eval, pt presenting with LUE numbness, weakness, and decreased balance. REquired min A and HHA for steadying throughout mobility. Educated about use of cane at home and will practice with cane during next session. Reports daughter and CNA can assist as needed upon d/c home. Recommending HHPT to increase balance and strength to maximize independence and safety with mobility. Will continue to follow acutely to maximize functional mobility independence and safety.     Follow Up Recommendations Home health PT;Supervision/Assistance - 24 hour    Equipment Recommendations  None recommended by PT    Recommendations for Other Services       Precautions / Restrictions Precautions Precautions: Shoulder;Fall Shoulder Interventions: Shoulder sling/immobilizer Required Braces or Orthoses: Sling Restrictions Weight Bearing Restrictions: Yes LUE Weight Bearing: Non weight bearing      Mobility  Bed Mobility Overal bed mobility: Needs Assistance Bed Mobility: Supine to Sit     Supine to sit: Min assist     General bed mobility comments: Min A for trunk elevation and to scoot hips to EOB.   Transfers Overall transfer level: Needs assistance Equipment used: 1 person hand held assist Transfers: Sit to/from Stand Sit to Stand: Min assist         General transfer comment: Min A for lift assist and steadying assist.   Ambulation/Gait Ambulation/Gait assistance: Min assist Ambulation Distance (Feet): 250 Feet Assistive device: 1 person hand held  assist Gait Pattern/deviations: Step-through pattern;Decreased stride length;Trunk flexed;Wide base of support Gait velocity: Decreased Gait velocity interpretation: Below normal speed for age/gender General Gait Details: Slow, very unsteady gait. REquired min A and HHA for balance. Educated about use of cane at home, however, will need to practice in next session.   Stairs            Wheelchair Mobility    Modified Rankin (Stroke Patients Only)       Balance Overall balance assessment: Needs assistance Sitting-balance support: Single extremity supported;Feet supported Sitting balance-Leahy Scale: Fair     Standing balance support: Single extremity supported;During functional activity Standing balance-Leahy Scale: Poor Standing balance comment: Reliant on UE and external assist for balance.                              Pertinent Vitals/Pain Pain Assessment: Faces Pain Score: 0-No pain    Home Living Family/patient expects to be discharged to:: Private residence Living Arrangements: Spouse/significant other;Children Available Help at Discharge: Family;Personal care attendant;Available 24 hours/day Type of Home: House Home Access: Stairs to enter Entrance Stairs-Rails: None Entrance Stairs-Number of Steps: 1(threshold step ) Home Layout: One level Home Equipment: Bedside commode;Cane - single point;Grab bars - tub/shower      Prior Function Level of Independence: Independent with assistive device(s)         Comments: Walked with cane      Hand Dominance   Dominant Hand: Right    Extremity/Trunk Assessment   Upper Extremity Assessment Upper Extremity Assessment: LUE deficits/detail LUE Deficits / Details: Reports numbness in LUE  Lower Extremity Assessment Lower Extremity Assessment: Generalized weakness    Cervical / Trunk Assessment Cervical / Trunk Assessment: Kyphotic  Communication   Communication: No difficulties  Cognition  Arousal/Alertness: Awake/alert Behavior During Therapy: WFL for tasks assessed/performed Overall Cognitive Status: Within Functional Limits for tasks assessed                                        General Comments General comments (skin integrity, edema, etc.): Pt's CNA present during session     Exercises     Assessment/Plan    PT Assessment Patient needs continued PT services  PT Problem List Decreased strength;Decreased range of motion;Decreased balance;Decreased mobility;Decreased knowledge of use of DME;Decreased knowledge of precautions;Impaired sensation       PT Treatment Interventions DME instruction;Gait training;Stair training;Functional mobility training;Therapeutic activities;Neuromuscular re-education;Balance training;Therapeutic exercise;Patient/family education    PT Goals (Current goals can be found in the Care Plan section)  Acute Rehab PT Goals Patient Stated Goal: to go home  PT Goal Formulation: With patient Time For Goal Achievement: 01/07/17 Potential to Achieve Goals: Good    Frequency Min 5X/week   Barriers to discharge        Co-evaluation               AM-PAC PT "6 Clicks" Daily Activity  Outcome Measure Difficulty turning over in bed (including adjusting bedclothes, sheets and blankets)?: A Little Difficulty moving from lying on back to sitting on the side of the bed? : Unable Difficulty sitting down on and standing up from a chair with arms (e.g., wheelchair, bedside commode, etc,.)?: Unable Help needed moving to and from a bed to chair (including a wheelchair)?: A Little Help needed walking in hospital room?: A Little Help needed climbing 3-5 steps with a railing? : A Lot 6 Click Score: 13    End of Session Equipment Utilized During Treatment: Gait belt;Other (comment)(sling ) Activity Tolerance: Patient tolerated treatment well Patient left: in chair;with call bell/phone within reach;with family/visitor  present Nurse Communication: Mobility status PT Visit Diagnosis: Unsteadiness on feet (R26.81);History of falling (Z91.81);Muscle weakness (generalized) (M62.81)    Time: 8250-0370 PT Time Calculation (min) (ACUTE ONLY): 21 min   Charges:   PT Evaluation $PT Eval Low Complexity: 1 Low     PT G Codes:        Leighton Ruff, PT, DPT  Acute Rehabilitation Services  Pager: [phone]   Rudean Hitt 12/31/2016, 6:40 PM

## 2016-12-31 NOTE — Anesthesia Procedure Notes (Signed)
Procedure Name: Intubation Date/Time: 12/31/2016 10:15 AM Performed by: Kyung Rudd, CRNA Pre-anesthesia Checklist: Patient identified, Emergency Drugs available, Suction available, Patient being monitored and Timeout performed Patient Re-evaluated:Patient Re-evaluated prior to induction Oxygen Delivery Method: Circle system utilized Preoxygenation: Pre-oxygenation with 100% oxygen Induction Type: IV induction Ventilation: Mask ventilation without difficulty Laryngoscope Size: Mac and 3 Grade View: Grade I Tube type: Oral Tube size: 7.0 mm Number of attempts: 1 Airway Equipment and Method: Stylet Placement Confirmation: ETT inserted through vocal cords under direct vision,  positive ETCO2 and breath sounds checked- equal and bilateral Secured at: 21 cm Tube secured with: Tape Dental Injury: Teeth and Oropharynx as per pre-operative assessment

## 2016-12-31 NOTE — Transfer of Care (Signed)
Immediate Anesthesia Transfer of Care Note  Patient: Shirley [name]  Procedure(s) Performed: REVERSE LEFT SHOULDER ARTHROPLASTY (Left Shoulder)  Patient Location: PACU  Anesthesia Type:GA combined with regional for post-op pain  Level of Consciousness: awake, alert  and oriented  Airway & Oxygen Therapy: Patient Spontanous Breathing and Patient connected to nasal cannula oxygen  Post-op Assessment: Report given to RN, Post -op Vital signs reviewed and stable and Patient moving all extremities  Post vital signs: Reviewed and stable  Last Vitals:  Vitals:   12/31/16 0832  BP: (!) 177/52  Pulse: (!) 57  Resp: 16  Temp: 36.8 C  SpO2: 97%    Last Pain:  Vitals:   12/31/16 0832  TempSrc: Oral         Complications: No apparent anesthesia complications

## 2017-01-01 LAB — HEMOGLOBIN AND HEMATOCRIT, BLOOD
HEMATOCRIT: 31.7 % — AB (ref 36.0–46.0)
Hemoglobin: 9.6 g/dL — ABNORMAL LOW (ref 12.0–15.0)

## 2017-01-01 NOTE — Discharge Summary (Signed)
Physician Discharge Summary  Patient ID: Shirley [name] MRN: [id number] DOB/AGE: June 18, 1940 76 y.o.  Admit date: 12/31/2016 Discharge date: 01/01/2017  Admission Diagnoses: left shoulder OA  Discharge Diagnoses:  Active Problems:   S/P shoulder replacement, left   Discharged Condition: good  Hospital Course:  Shirley [name] is a 76 y.o. who was admitted to [facility]. They were brought to the operating room on 12/31/2016 and underwent Procedure(s): REVERSE LEFT SHOULDER ARTHROPLASTY.  Patient tolerated the procedure well and was later transferred to the recovery room and then to the orthopaedic floor for postoperative care.  They were given PO and IV analgesics for pain control following their surgery.  They were given 24 hours of postoperative antibiotics of  Anti-infectives (From admission, onward)   Start     Dose/Rate Route Frequency Ordered Stop   12/31/16 1600  ceFAZolin (ANCEF) IVPB 2g/100 mL premix     2 g 200 mL/hr over 30 Minutes Intravenous Every 6 hours 12/31/16 1506 12/31/16 2230   12/31/16 0831  ceFAZolin (ANCEF) IVPB 2g/100 mL premix     2 g 200 mL/hr over 30 Minutes Intravenous On call to O.R. 12/31/16 0831 12/31/16 1048     and started on DVT prophylaxis    PT and OT were ordered for total joint protocol.  Discharge planning consulted to help with postop disposition and equipment needs.  Patient had a good night on the evening of surgery and started to get up OOB with therapy on day one.  Continued to work with therapy into day two.  Dressing was with normal limits.  The patient had progressed with therapy and meeting their goals. Patient was seen in rounds and was ready to go home.  Consults: n/a  Significant Diagnostic Studies:routine  Treatments: routine  Discharge Exam: Blood pressure (!) 160/72, pulse (!) 56, temperature 98.7 F (37.1 C), temperature source Oral, resp. rate 18, SpO2 100 %. Alert and oriented x3. RRR, Lungs clear, BS x4. Left Calf soft  and non tender. L shoulder dressing C/D/I. No DVT signs. No signs of infection or compartment syndrome. LLE grossly neurovascularly intact.   Disposition: 06-Home-Health Care Svc   Allergies as of 01/01/2017   No Known Allergies     Medication List    TAKE these medications   ALPRAZolam 0.5 MG tablet Commonly known as:  XANAX Take 0.5-1 mg by mouth 3 (three) times daily as needed for anxiety or sleep.   fluticasone 50 MCG/ACT nasal spray Commonly known as:  FLONASE Place 2 sprays into both nostrils daily as needed for allergies or rhinitis.   gabapentin 300 MG capsule Commonly known as:  NEURONTIN Take 300-900 mg by mouth See admin instructions. Take 300 mg by mouth in the morning, take 300 mg by mouth in the afternoon and take 900 mg by mouth at bedtime   ibuprofen 200 MG tablet Commonly known as:  ADVIL,MOTRIN Take 800 mg by mouth every 6 (six) hours as needed for headache or moderate pain.   metoprolol tartrate 25 MG tablet Commonly known as:  LOPRESSOR Take 1 tablet (25 mg total) by mouth 2 (two) times daily. What changed:  how much to take   MOISTURIZING LUBRICANT EYE OP Place 1 drop into both eyes as needed (for dry eyes).   oxycodone 30 MG immediate release tablet Commonly known as:  ROXICODONE Take 1 tablet (30 mg total) by mouth every 4 (four) hours as needed for pain (for pain). Reported on 06/03/2015 What changed:  reasons to take this  additional instructions   oxycodone 30 MG immediate release tablet Commonly known as:  ROXICODONE Take 1 tablet (30 mg total) every 4 (four) hours as needed by mouth for severe pain. What changed:  You were already taking a medication with the same name, and this prescription was added. Make sure you understand how and when to take each.   rivaroxaban 10 MG Tabs tablet Commonly known as:  XARELTO Take 1 tablet (10 mg total) by mouth daily with breakfast. Take Xarelto for two and a half more weeks following discharge  from the hospital, then discontinue Xarelto. Once the patient has completed the blood thinner regimen, then take a Baby 81 mg Aspirin daily for three more weeks.   sertraline 100 MG tablet Commonly known as:  ZOLOFT Take 100 mg by mouth 2 (two) times daily.   tiZANidine 4 MG tablet Commonly known as:  ZANAFLEX Take 1 tablet (4 mg total) by mouth every 6 (six) hours as needed for muscle spasms. What changed:  Another medication with the same name was added. Make sure you understand how and when to take each.   tiZANidine 4 MG tablet Commonly known as:  ZANAFLEX Take 1 tablet (4 mg total) every 8 (eight) hours as needed by mouth for muscle spasms. What changed:  You were already taking a medication with the same name, and this prescription was added. Make sure you understand how and when to take each.   torsemide 10 MG tablet Commonly known as:  DEMADEX Take 10 mg by mouth daily as needed (for fluid).   Vitamin D3 5000 units Caps Take 5,000 Units by mouth 2 (two) times daily.      Follow-up Information    Netta Cedars, MD. Call in 2 weeks.   Specialty:  Orthopedic Surgery Why:  [phone] Contact information: [address] [place] [address] [phone]           Signed: Lajean Manes 01/01/2017, 9:58 AM

## 2017-01-01 NOTE — Progress Notes (Signed)
Pt doing well. Pt given D/C instructions with Rx's, verbal understanding was provided. Pt's incision is clean and dry with no sign of infection. Pt's IV was removed prior to D/C. Pt D/C'd home via wheelchair @ 1145 per MD order. Pt is stable @ D/C and has no other needs at this time. Holli Humbles, RN

## 2017-01-01 NOTE — Evaluation (Addendum)
Occupational Therapy Evaluation Patient Details Name: Shirley [name] MRN: [id number] DOB: 03-11-1940 Today's Date: 01/01/2017    History of Present Illness Pt is a 76 y/o female s/p L reverse total shoulder arthroplasty. PMH includes HTN, fibromyalgia, falls, DVT, L olecranon fx s/p ORIF, L THA, R knee surgery, and back surgery.    Clinical Impression   PTA, pt was living with her daughter and had a PCA to assist with safety during mobility, occasional ADLs, and IADLs; pt able to perform BADLs including dressing and bathing. Pt currently requiring Max A for ADLs and Min guard-Min A for functional mobility. Provided education on shoulder precautions, exercises, sling management, and UB ADLs. Pt would benefit form further acute OT to facilitate safe dc and address UB ADLs. Answered all pt questions and provided education in preparation for dc later today. Recommend dc with 24 hour supervision and HHOT to optimize safety and independence with ADLs and functional mobility.     Follow Up Recommendations  Home health OT;Supervision/Assistance - 24 hour    Equipment Recommendations  None recommended by OT    Recommendations for Other Services PT consult     Precautions / Restrictions Precautions Precautions: Shoulder;Fall Type of Shoulder Precautions: Active protocal with FF 0-90; Abduction 0-60; ER 0-30 Shoulder Interventions: Shoulder sling/immobilizer Required Braces or Orthoses: Sling Restrictions Weight Bearing Restrictions: Yes LUE Weight Bearing: Non weight bearing      Mobility Bed Mobility               General bed mobility comments: OOB upon arrival  Transfers Overall transfer level: Needs assistance Equipment used: None Transfers: Sit to/from Stand Sit to Stand: Min guard         General transfer comment: for safety and to gaurd LUE    Balance Overall balance assessment: Needs assistance Sitting-balance support: Feet supported Sitting balance-Leahy Scale:  Fair     Standing balance support: No upper extremity supported Standing balance-Leahy Scale: Fair Standing balance comment: Reliant on UE and external assist for balance.                            ADL either performed or assessed with clinical judgement   ADL Overall ADL's : Needs assistance/impaired Eating/Feeding: Set up;Sitting   Grooming: Min guard;Standing;Minimal assistance   Upper Body Bathing: Maximal assistance;Sitting   Lower Body Bathing: Sit to/from stand;Moderate assistance   Upper Body Dressing : Maximal assistance;Sitting   Lower Body Dressing: Maximal assistance;Sit to/from stand   Toilet Transfer: Minimal assistance(Simulated in room)           Functional mobility during ADLs: Moderate assistance;Cueing for safety General ADL Comments: Max A for ADLs due to limited ROM for L shoulder and pain     Vision Baseline Vision/History: Wears glasses Wears Glasses: At all times Patient Visual Report: No change from baseline       Perception     Praxis      Pertinent Vitals/Pain Pain Assessment: 0-10 Pain Score: 8  Pain Location: L shoulder Pain Descriptors / Indicators: Sore Pain Intervention(s): Monitored during session;Limited activity within patient's tolerance;Repositioned     Hand Dominance Right   Extremity/Trunk Assessment Upper Extremity Assessment Upper Extremity Assessment: LUE deficits/detail LUE Deficits / Details: s/p total shoulder with active protocal. Reports numbness in LUE  LUE Coordination: decreased fine motor;decreased gross motor   Lower Extremity Assessment Lower Extremity Assessment: Generalized weakness   Cervical / Trunk Assessment Cervical / Trunk Assessment:  Kyphotic   Communication Communication Communication: No difficulties   Cognition Arousal/Alertness: Awake/alert Behavior During Therapy: WFL for tasks assessed/performed Overall Cognitive Status: Within Functional Limits for tasks assessed                                      General Comments  Provided handout with all information for shoulder    Exercises Exercises: Shoulder Shoulder Exercises Shoulder Flexion: AAROM;5 reps;Left;Seated Shoulder Extension: AROM;Left;5 reps;Seated Elbow Flexion: AAROM;Left;5 reps;Seated Elbow Extension: AAROM;5 reps;Seated Wrist Flexion: AAROM;Left;10 reps;Seated Wrist Extension: AAROM;Left;10 reps;Seated Digit Composite Flexion: AROM;Left;10 reps;Seated Composite Extension: AROM;Left;10 reps;Seated Neck Flexion: Other (comment)(Discussed neck excercises with demonstrating) Neck Extension: Other (comment)(Discussed neck excercises with demonstrating) Neck Lateral Flexion - Right: Other (comment)(Discussed neck excercises with demonstrating) Neck Lateral Flexion - Left: Other (comment)(Discussed neck excercises with demonstrating)   Shoulder Instructions Shoulder Instructions Donning/doffing shirt without moving shoulder: Maximal assistance Method for sponge bathing under operated UE: Maximal assistance Donning/doffing sling/immobilizer: Maximal assistance Correct positioning of sling/immobilizer: Maximal assistance ROM for elbow, wrist and digits of operated UE: Minimal assistance Sling wearing schedule (on at all times/off for ADL's): Minimal assistance Proper positioning of operated UE when showering: Minimal assistance Positioning of UE while sleeping: Moderate assistance    Home Living Family/patient expects to be discharged to:: Private residence Living Arrangements: Spouse/significant other;Children Available Help at Discharge: Family;Personal care attendant;Available 24 hours/day Type of Home: House Home Access: Stairs to enter [employer] of Steps: 1(threshold step ) Entrance Stairs-Rails: None Home Layout: One level     Bathroom Shower/Tub: Occupational psychologist: Handicapped height     Home Equipment: Bedside commode;Cane - single  point;Grab bars - tub/shower   Additional Comments: Pt has friend or hired Public house manager first week      Prior Functioning/Environment Level of Independence: Independent with assistive device(s)        Comments: Walked with cane         OT Problem List: Decreased strength;Decreased range of motion;Decreased activity tolerance;Impaired balance (sitting and/or standing);Decreased safety awareness;Decreased knowledge of use of DME or AE;Decreased knowledge of precautions;Pain;Impaired UE functional use      OT Treatment/Interventions: Self-care/ADL training;Therapeutic exercise;Energy conservation;DME and/or AE instruction;Therapeutic activities;Patient/family education    OT Goals(Current goals can be found in the care plan section) Acute Rehab OT Goals Patient Stated Goal: to go home  OT Goal Formulation: With patient Time For Goal Achievement: 01/15/17 Potential to Achieve Goals: Good ADL Goals Pt Will Perform Grooming: with set-up;with supervision;standing Pt Will Perform Upper Body Dressing: with min assist;with caregiver independent in assisting;sitting Pt Will Perform Lower Body Dressing: with min assist;with caregiver independent in assisting;sit to/from stand Pt Will Transfer to Toilet: ambulating;bedside commode;with supervision Pt Will Perform Toileting - Clothing Manipulation and hygiene: with min assist;sit to/from stand;with caregiver independent in assisting Pt/caregiver will Perform Home Exercise Program: Left upper extremity;Increased ROM;Increased strength;With Supervision;With written HEP provided  OT Frequency: Min 2X/week   Barriers to D/C:            Co-evaluation              AM-PAC PT "6 Clicks" Daily Activity     Outcome Measure Help from another person eating meals?: None Help from another person taking care of personal grooming?: A Little Help from another person toileting, which includes using toliet, bedpan, or urinal?: A Lot Help from  another person bathing (including washing,  rinsing, drying)?: A Lot Help from another person to put on and taking off regular upper body clothing?: A Lot Help from another person to put on and taking off regular lower body clothing?: A Lot 6 Click Score: 15   End of Session Equipment Utilized During Treatment: Gait belt;Other (comment)(Sling) Nurse Communication: Mobility status  Activity Tolerance: Patient tolerated treatment well;Patient limited by pain Patient left: in chair;with call bell/phone within reach  OT Visit Diagnosis: Unsteadiness on feet (R26.81);Other abnormalities of gait and mobility (R26.89);Muscle weakness (generalized) (M62.81);Pain;History of falling (Z91.81) Pain - Right/Left: Left Pain - part of body: Arm;Shoulder                Time: 2800-3491 OT Time Calculation (min): 26 min Charges:  OT General Charges $OT Visit: 1 Visit OT Evaluation $OT Eval Moderate Complexity: 1 Mod OT Treatments $Self Care/Home Management : 8-22 mins G-Codes:     Hyde Sires MSOT, OTR/L Acute Rehab Pager: [phone] Office: [place] 01/01/2017, 10:42 AM

## 2017-01-01 NOTE — Progress Notes (Signed)
Physical Therapy Treatment Patient Details Name: Shirley [name] MRN: [id number] DOB: [birth year] Today's Date: 01/01/2017    History of Present Illness Pt is a 76 y/o female s/p L reverse total shoulder arthroplasty. PMH includes HTN, fibromyalgia, falls, DVT, L olecranon fx s/p ORIF, L THA, R knee surgery, and back surgery.     PT Comments    Pt making good progress towards achieving her current functional mobility goals. Pt more steady with ambulation with no UE support required. Pt would continue to benefit from skilled physical therapy services at this time while admitted and after d/c to address the below listed limitations in order to improve overall safety and independence with functional mobility.    Follow Up Recommendations  Home health PT;Supervision/Assistance - 24 hour     Equipment Recommendations  None recommended by PT    Recommendations for Other Services       Precautions / Restrictions Precautions Precautions: Shoulder;Fall Shoulder Interventions: Shoulder sling/immobilizer Restrictions Weight Bearing Restrictions: Yes LUE Weight Bearing: Non weight bearing    Mobility  Bed Mobility               General bed mobility comments: OOB upon arrival  Transfers Overall transfer level: Needs assistance Equipment used: None Transfers: Sit to/from Stand Sit to Stand: Min guard         General transfer comment: for safety  Ambulation/Gait Ambulation/Gait assistance: Min guard Ambulation Distance (Feet): 100 Feet Assistive device: None Gait Pattern/deviations: Step-through pattern;Decreased stride length;Trunk flexed;Wide base of support Gait velocity: Decreased Gait velocity interpretation: Below normal speed for age/gender General Gait Details: slow, shuffling gait pattern, more steady today and no need for physical assistance, min guard for safety   Stairs            Wheelchair Mobility    Modified Rankin (Stroke Patients Only)       Balance Overall balance assessment: Needs assistance Sitting-balance support: Feet supported Sitting balance-Leahy Scale: Fair     Standing balance support: No upper extremity supported Standing balance-Leahy Scale: Fair                              Cognition Arousal/Alertness: Awake/alert Behavior During Therapy: WFL for tasks assessed/performed Overall Cognitive Status: Within Functional Limits for tasks assessed                                        Exercises      General Comments        Pertinent Vitals/Pain Pain Assessment: 0-10 Pain Score: 8  Pain Location: L shoulder Pain Descriptors / Indicators: Sore Pain Intervention(s): Monitored during session;Repositioned    Home Living                      Prior Function            PT Goals (current goals can now be found in the care plan section) Acute Rehab PT Goals PT Goal Formulation: With patient Time For Goal Achievement: 01/07/17 Potential to Achieve Goals: Good Progress towards PT goals: Progressing toward goals    Frequency    Min 5X/week      PT Plan Current plan remains appropriate    Co-evaluation              AM-PAC PT "6 Clicks" Daily Activity  Outcome Measure  Difficulty turning over in bed (including adjusting bedclothes, sheets and blankets)?: A Little Difficulty moving from lying on back to sitting on the side of the bed? : Unable Difficulty sitting down on and standing up from a chair with arms (e.g., wheelchair, bedside commode, etc,.)?: A Lot Help needed moving to and from a bed to chair (including a wheelchair)?: A Little Help needed walking in hospital room?: A Little Help needed climbing 3-5 steps with a railing? : A Lot 6 Click Score: 14    End of Session Equipment Utilized During Treatment: Gait belt;Other (comment)(L UE sling) Activity Tolerance: Patient tolerated treatment well Patient left: in chair;with call bell/phone within  reach Nurse Communication: Mobility status PT Visit Diagnosis: Unsteadiness on feet (R26.81);History of falling (Z91.81);Muscle weakness (generalized) (M62.81)     Time: 3338-3291 PT Time Calculation (min) (ACUTE ONLY): 17 min  Charges:  $Gait Training: 8-22 mins                    G Codes:       [place], [place], [place] 01/01/2017, 9:15 AM

## 2017-01-01 NOTE — Care Management Note (Signed)
Case Management Note  Patient Details  Name: Shirley [name] MRN: [id number] Date of Birth: 12-Aug-1940  Subjective/Objective:                 Spoke w patient and staff. DME provided by staff. Nurse states that patient will go to first follow up and [place] will be determined at that per her discussion with MD this AM.    Action/Plan:   Expected Discharge Date:  01/01/17               Expected Discharge Plan:  Home/Self Care  In-House Referral:     Discharge planning Services  CM Consult  Post Acute Care Choice:    Choice offered to:     DME Arranged:    DME Agency:     HH Arranged:    [place] Agency:     Status of Service:  Completed, signed off  If discussed at [employer], dates discussed:    Additional Comments:  Carles Collet, RN 01/01/2017, 10:12 AM

## 2017-01-01 NOTE — Op Note (Deleted)
  The note originally documented on this encounter has been moved the the encounter in which it belongs.  

## 2017-01-01 NOTE — Op Note (Signed)
NAMECHRYSTEN, Shirley [name] NO.:  [id number]  MEDICAL RECORD NO.:  [id number]  LOCATION:                                 FACILITY:  PHYSICIAN:  Doran Heater. Veverly Fells, M.D. DATE OF BIRTH:  1940/08/09  DATE OF PROCEDURE:  12/31/2016 DATE OF DISCHARGE:                              OPERATIVE REPORT   PREOPERATIVE DIAGNOSIS:  Left shoulder osteoarthritis, end-stage with rotator cuff insufficiency.  POSTOPERATIVE DIAGNOSIS:  Left shoulder osteoarthritis, end-stage with rotator cuff insufficiency.  PROCEDURE PERFORMED:  Left shoulder reverse total shoulder arthroplasty using DePuy Delta Xtend prosthesis.  ATTENDING SURGEON:  Doran Heater. Veverly Fells, MD.  ASSISTANT:  Abbott Pao. Dixon, PA-C, who has scrubbed the entire procedure and necessary for satisfactory completion of surgery.  ANESTHESIA:  General anesthesia was used plus interscalene block.  ESTIMATED BLOOD LOSS:  100 mL.  FLUID REPLACEMENT:  1500 mL crystalloid.  INSTRUMENT COUNTS:  Correct.  COMPLICATIONS:  There were no complications.  Perioperative antibiotics were given.  INDICATIONS:  The patient is a 76 year old female with progressively worsening left shoulder pain and function secondary to rotator cuff tear arthropathy.  The patient has had a failure of conservative management over extended period time and presents now with disabling shoulder pain, requiring narcotic pain medication as well as interference with activities of daily living and declining function.  The patient's desires operative treatment to restore function and eliminate pain. Informed consent was obtained.  DESCRIPTION OF PROCEDURE:  After an adequate level of anesthesia achieved, the patient was positioned in modified beach-chair position, left shoulder correctly identified, and sterilely prepped and draped in usual manner.  Time-out called.  We entered the shoulder using standard deltopectoral incision starting at the coracoid process  extending down to the anterior humerus.  Dissection down through subcutaneous tissues using Bovie electrocautery.  The cephalic vein was identified, taken laterally at the deltoid.  Pectoralis was taken medially.  Conjoint tendon identified and retracted medially.  We tenodesed the biceps in situ with 0 Vicryl figure-of-eight suture x2.  We then went ahead and released the subscapularis and tagged with the FiberWire suture for retraction during the surgery and protection of the axillary nerve.  We released the biceps tendon and I spent quite a bit of time releasing scar tissue in the subdeltoid subacromial plane.  The patient's humerus was all way up against the acromion and basically there was acetabularization of the acromion, so it was quite difficult to get the shoulder to get the humerus dislocated and out from under the widened and very tight CA arch.  We were finally able to free up enough soft tissue to get the shoulder extended and get it dislocated.  We then entered the proximal humerus with a 6 mm reamer, reaming up to a size 10.  Once we did the 10 mm reamer, we placed a 10 mm intramedullary resection guide and then resected the head.  We actually resected about 2-3 mm more bone off the head, knowing that she was proximally contracted.  Once we had that done with our oscillating saw at the appropriate angle, referencing off the jigs, then we did a metaphyseal preparation for the epi-1 left  and we reamed for the size 1 left epiphysis.  We then placed the trial component in place so as the 10 stem epi-1 left on the 0 setting, placed in 10 degrees retroversion, impacted that in position.  Initially, we tried to do our glenoid exposure with that in to protect the bone; however, we were not able to do that due to visualization issues based on how tight she was proximally.  So, we did progress for release.  A 360-degree exposure of the glenoid was quite difficult, but we were able  to get back to native glenoid, removing some of the anterior osteophytes.  We then found the center point low on the glenoid.  Again, she had medialized the glenoid quite a bit, so we did not have a whole lot of bone to work with.  So, we found best bone fit and reamed for the base plate.  We then drilled central peg hole, impacted the metaglene into position, and placed screws 42 inferiorly and a 30 at the base of the coracoid, 24 locked anteriorly and 18 nonlocking posteriorly, showed good plate and screw fixation.  We then placed a 38 eccentric glenosphere to get coverage over the bone inferiorly and screwed that into position.  Next, we went ahead and prepared the humeral side for our stem.  We irrigated thoroughly.  Used a hybrid method with a HA-coated press-fit stem, but basically a potting technique was used, some DePuy high viscosity 1 cement around the metaphyseal portion of the component, and we also used some bone graft down below that.  So, we impacted that into position in 10 degrees retroversion, tried to get that as countersunk as possible. Selected the size 3, 38 +3 poly, impacted that in place, and then spent probably 10 minutes trying to get the shoulder reduced.  It was very difficult to get it reduced, but once we got it reduced, the conjoined tendon was tight, but not too tight and the axillary nerve was not under excessive pressure.  Shoulder was quite stable through a full range of motion.  We irrigated thoroughly, removed the remnant of the subscap, closed the deltopectoral interval with 0 Vicryl suture followed by 2-0 Vicryl for subcutaneous closure, and 4-0 Monocryl for skin.  Steri- Strips were applied followed by sterile dressing.  The patient tolerated the surgery well.     Doran Heater. Veverly Fells, M.D.   ______________________________ Doran Heater. Veverly Fells, M.D.    SRN/MEDQ  D:  12/31/2016  T:  01/01/2017  Job:  [id number]

## 2017-01-01 NOTE — Progress Notes (Signed)
Subjective: 1 Day Post-Op Procedure(s) (LRB): REVERSE LEFT SHOULDER ARTHROPLASTY (Left) Patient reports pain as mild.  Tolerating PO's. Denies SOB,CP, f/c. Reports she is ready to d/c.  Objective: Vital signs in last 24 hours: Temp:  [97.5 F (36.4 C)-98.7 F (37.1 C)] 98.7 F (37.1 C) (11/10 0804) Pulse Rate:  [49-69] 56 (11/10 0804) Resp:  [13-25] 18 (11/10 0804) BP: (117-187)/(44-95) 160/72 (11/10 0804) SpO2:  [94 %-100 %] 100 % (11/10 0804)  Intake/Output from previous day: 11/09 0701 - 11/10 0700 In: 1180 [P.O.:480; I.V.:600; IV Piggyback:100] Out: 150 [Blood:150] Intake/Output this shift: No intake/output data recorded.  Recent Labs    01/01/17 0818  HGB 9.6*   Recent Labs    01/01/17 0818  HCT 31.7*   No results for input(s): NA, K, CL, CO2, BUN, CREATININE, GLUCOSE, CALCIUM in the last 72 hours. No results for input(s): LABPT, INR in the last 72 hours.  Alert and oriented x3. RRR, Lungs clear, BS x4. Left Calf soft and non tender. L shoulder dressing C/D/I. No DVT signs. No signs of infection or compartment syndrome. LLE grossly neurovascularly intact.   Assessment/Plan: 1 Day Post-Op Procedure(s) (LRB): REVERSE LEFT SHOULDER ARTHROPLASTY (Left) D/c home OT this am Follow instructions F/u in office Sling as directed  STILWELL, BRYSON L 01/01/2017, 9:56 AM

## 2017-01-01 NOTE — Plan of Care (Signed)
  Progressing Safety: Ability to remain free from injury will improve 01/01/2017 0423 - Progressing by Jonnie Finner, RN Education: Knowledge of the prescribed therapeutic regimen will improve 01/01/2017 0423 - Progressing by Jonnie Finner, RN Understanding of activity limitations/precautions following surgery will improve 01/01/2017 0423 - Progressing by Jonnie Finner, RN Activity: Ability to tolerate increased activity will improve 01/01/2017 0423 - Progressing by Jonnie Finner, RN Pain Management: Pain level will decrease with appropriate interventions 01/01/2017 0423 - Progressing by Jonnie Finner, RN

## 2017-01-03 ENCOUNTER — Encounter (HOSPITAL_COMMUNITY): Payer: Self-pay | Admitting: Orthopedic Surgery

## 2017-01-18 DIAGNOSIS — Z96612 Presence of left artificial shoulder joint: Secondary | ICD-10-CM | POA: Diagnosis not present

## 2017-03-08 DIAGNOSIS — M791 Myalgia, unspecified site: Secondary | ICD-10-CM | POA: Diagnosis not present

## 2017-03-08 DIAGNOSIS — M47892 Other spondylosis, cervical region: Secondary | ICD-10-CM | POA: Diagnosis not present

## 2017-03-08 DIAGNOSIS — M542 Cervicalgia: Secondary | ICD-10-CM | POA: Diagnosis not present

## 2017-03-08 DIAGNOSIS — Z6827 Body mass index (BMI) 27.0-27.9, adult: Secondary | ICD-10-CM | POA: Diagnosis not present

## 2017-03-31 DIAGNOSIS — M961 Postlaminectomy syndrome, not elsewhere classified: Secondary | ICD-10-CM | POA: Diagnosis not present

## 2017-03-31 DIAGNOSIS — R600 Localized edema: Secondary | ICD-10-CM | POA: Diagnosis not present

## 2017-03-31 DIAGNOSIS — J9811 Atelectasis: Secondary | ICD-10-CM | POA: Diagnosis not present

## 2017-03-31 DIAGNOSIS — F329 Major depressive disorder, single episode, unspecified: Secondary | ICD-10-CM | POA: Diagnosis not present

## 2017-03-31 DIAGNOSIS — R0989 Other specified symptoms and signs involving the circulatory and respiratory systems: Secondary | ICD-10-CM | POA: Diagnosis not present

## 2017-03-31 DIAGNOSIS — R05 Cough: Secondary | ICD-10-CM | POA: Diagnosis not present

## 2017-05-16 DIAGNOSIS — R0989 Other specified symptoms and signs involving the circulatory and respiratory systems: Secondary | ICD-10-CM | POA: Diagnosis not present

## 2017-05-16 DIAGNOSIS — N390 Urinary tract infection, site not specified: Secondary | ICD-10-CM | POA: Diagnosis not present

## 2017-05-16 DIAGNOSIS — R05 Cough: Secondary | ICD-10-CM | POA: Diagnosis not present

## 2017-05-16 DIAGNOSIS — J209 Acute bronchitis, unspecified: Secondary | ICD-10-CM | POA: Diagnosis not present

## 2017-08-08 DIAGNOSIS — N183 Chronic kidney disease, stage 3 (moderate): Secondary | ICD-10-CM | POA: Diagnosis not present

## 2017-08-08 DIAGNOSIS — M961 Postlaminectomy syndrome, not elsewhere classified: Secondary | ICD-10-CM | POA: Diagnosis not present

## 2017-08-08 DIAGNOSIS — M4120 Other idiopathic scoliosis, site unspecified: Secondary | ICD-10-CM | POA: Diagnosis not present

## 2017-08-08 DIAGNOSIS — I129 Hypertensive chronic kidney disease with stage 1 through stage 4 chronic kidney disease, or unspecified chronic kidney disease: Secondary | ICD-10-CM | POA: Diagnosis not present

## 2017-08-09 IMAGING — MR MR HIP*L* WO/W CM
4 of 10 series · 17 of 40 positions shown · IV contrast (multihance)
Comparison: Osseous survey 12/23/2015.  CT pelvis 12/15/2015.

CLINICAL DATA: Worsening left hip pain over the past 2 months.
Lytic lesions identified in the left ischium and greater trochanter
on CT scan of the pelvis 12/15/2015.

EXAM:
MRI OF THE LEFT HIP WITHOUT AND WITH CONTRAST
TECHNIQUE: Multiplanar, multisequence MR imaging was performed both before and
after administration of intravenous contrast.
CONTRAST:  15 ml MULTIHANCE GADOBENATE DIMEGLUMINE 529 MG/ML IV SOLN

[Series 6: T1 · coronal · 3.5mm · 0.78mm/px · 4 of 27 slices shown (1 of 2)]
[im 1/27]
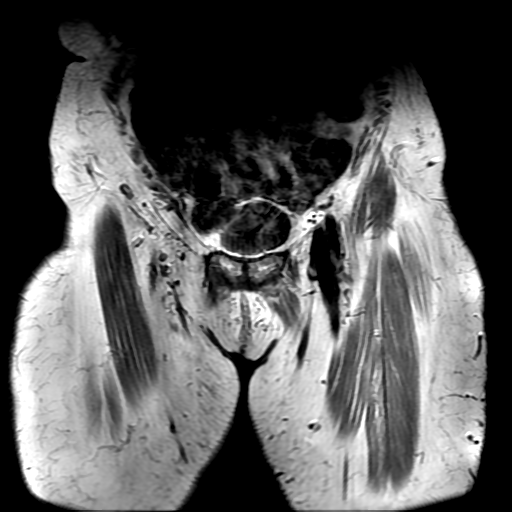
[im 9/27]
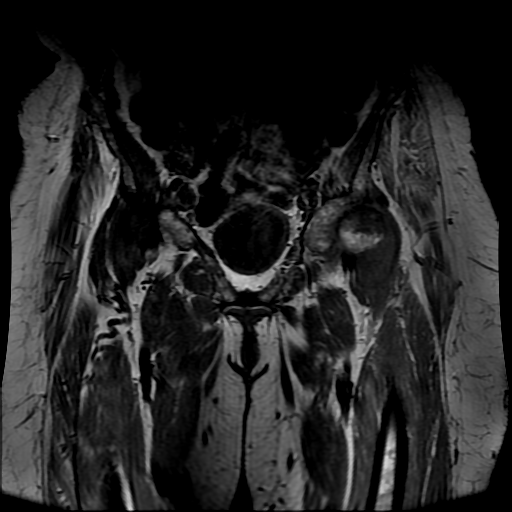
[im 18/27]
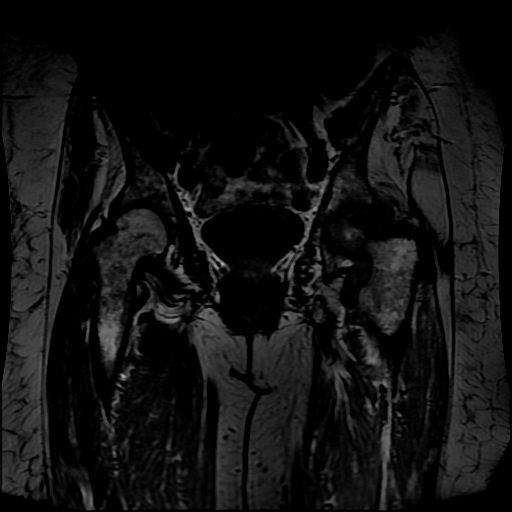
[im 27/27]
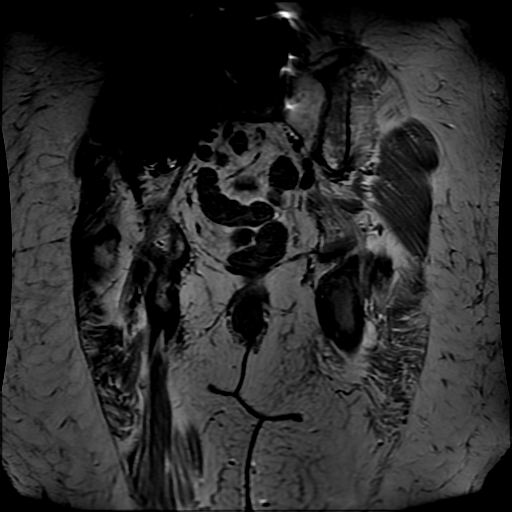

[Series 8: T2 fat-sat · axial · 3.5mm · 0.74mm/px · z∈[+183,+299]mm · 4 of 30 slices shown]
[im 1/30]
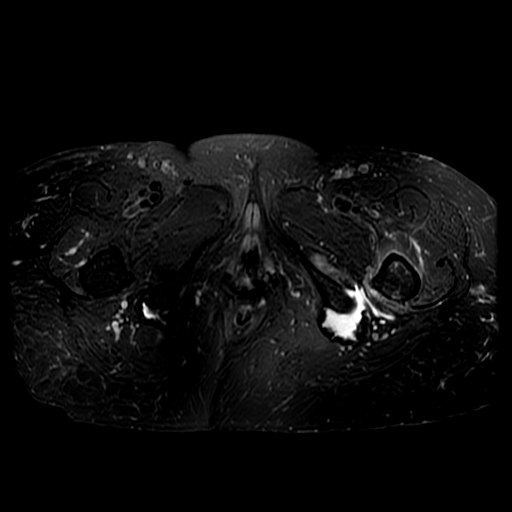
[im 10/30]
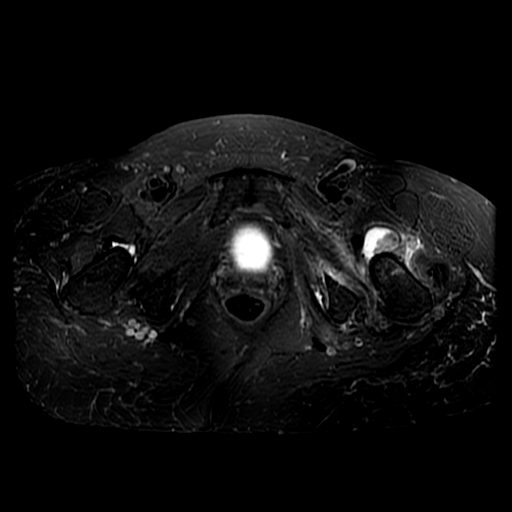
[im 20/30]
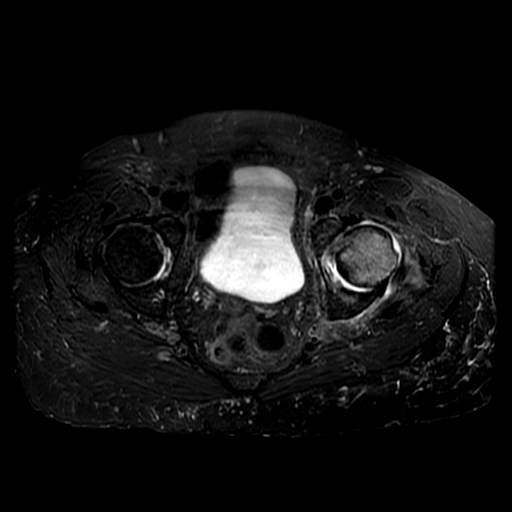
[im 30/30]
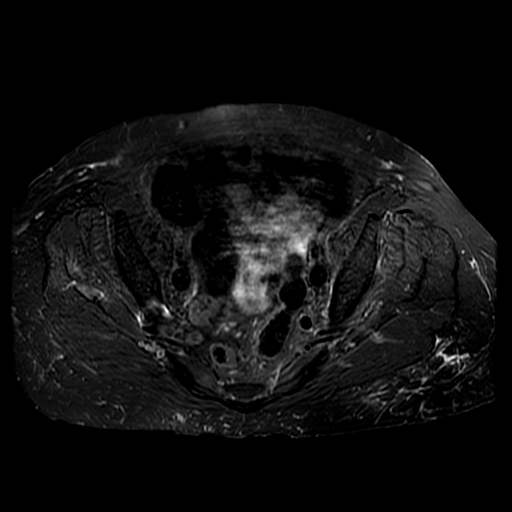

[Series 9: T1 · axial · 3.5mm · 0.74mm/px · z∈[+163,+299]mm · 5 of 35 slices shown (2 of 2)]
[im 1/35]
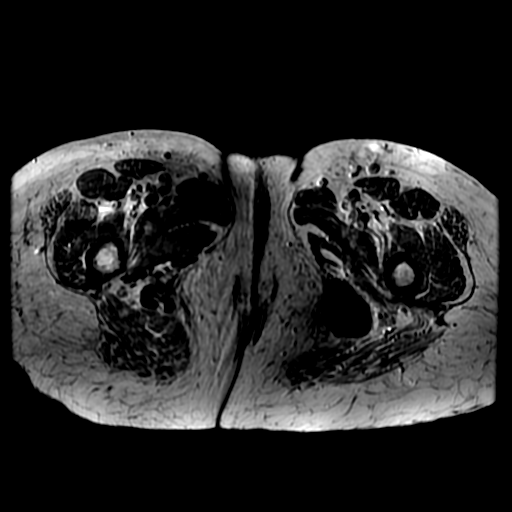
[im 9/35]
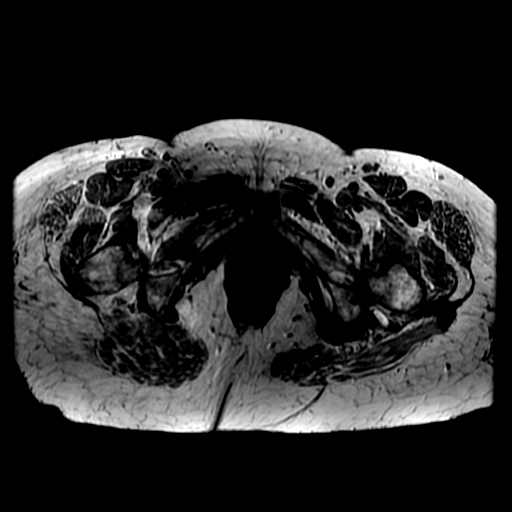
[im 18/35]
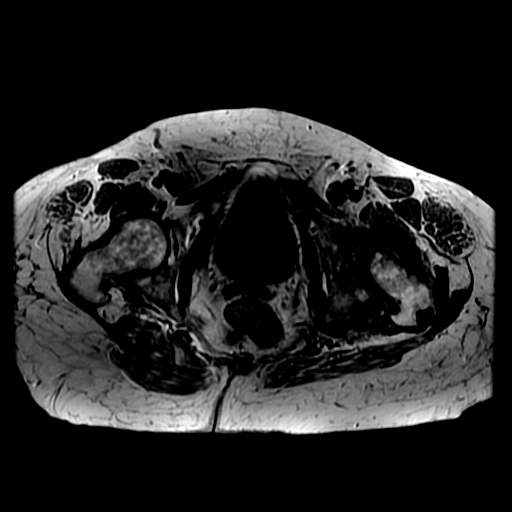
[im 26/35]
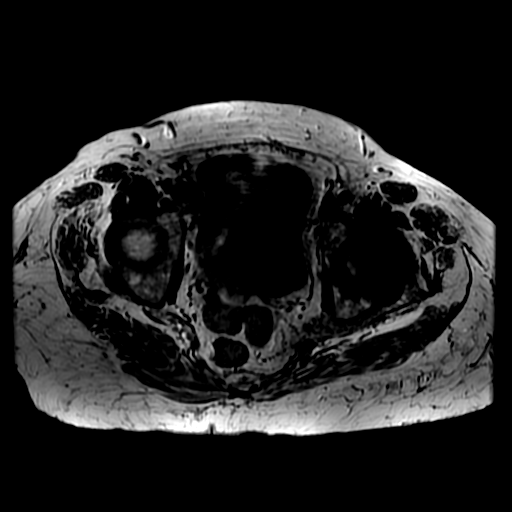
[im 35/35]
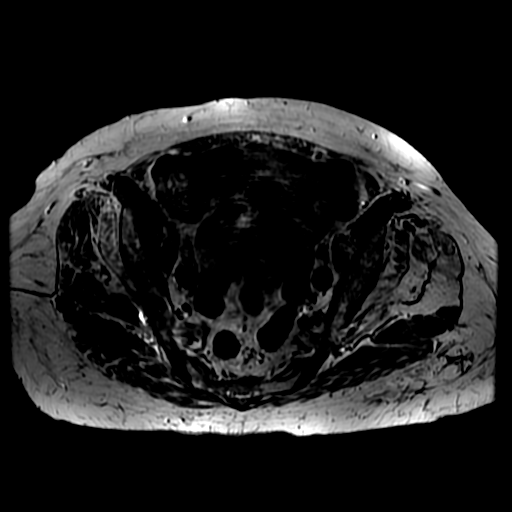

[Series 11: PD fat-sat · sagittal · 4.0mm · 0.39mm/px · 4 of 31 slices shown]
[im 1/31]
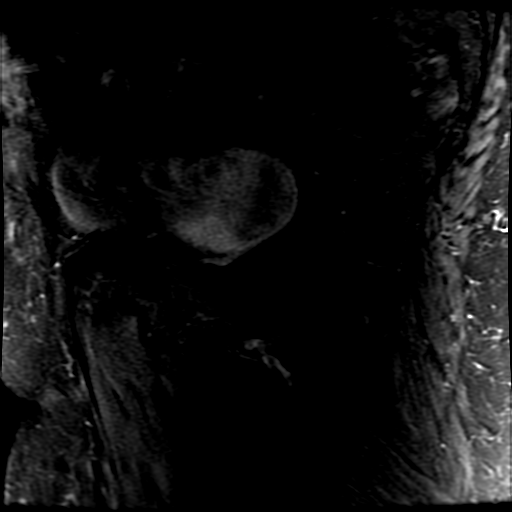
[im 11/31]
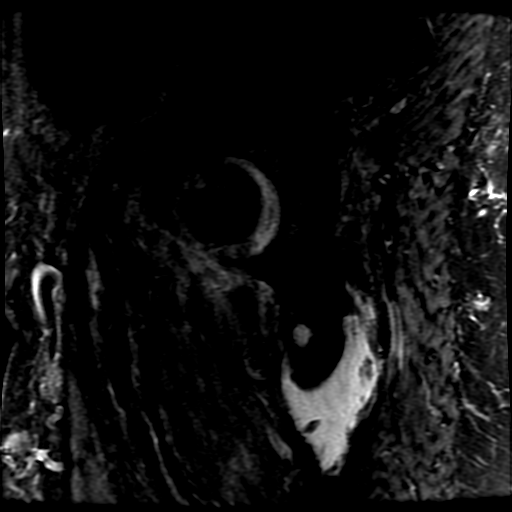
[im 21/31]
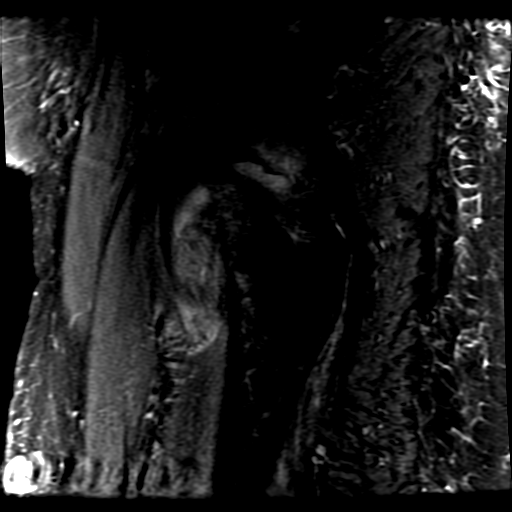
[im 31/31]
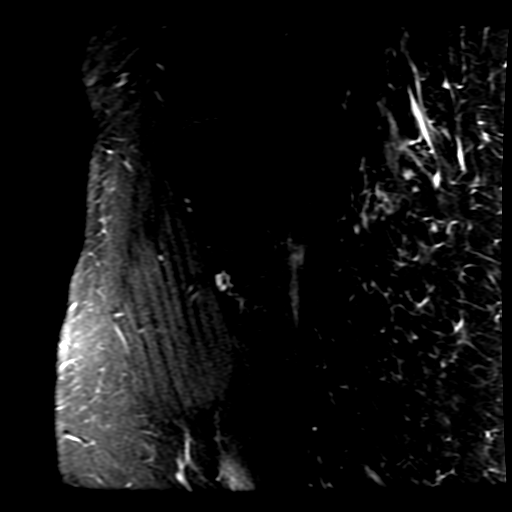

[17 of 40 positions shown; findings below may reference images not displayed]

FINDINGS: Bones: There is marrow edema and enhancement in the left femoral
head. The superior articular surface of the femoral head is
flattened with bone-on-bone joint space narrowing about the left hip
identified. Mild subchondral edema and enhancement are also seen in
the left acetabulum. Small fat containing lesion in the left greater
trochanter measuring 1.1 cm correlates with finding on prior CT scan
and is consistent with a benign entity such as an intraosseous
lipoma. A tiny T2 hyperintense lesion with mild rim enhancement in
the left ischial tuberosity correlates with finding on CT and has
benign features. It may represent intraosseous geode.

Articular cartilage and labrum

Articular cartilage: Completely denuded about the left hip with
associated bone-on-bone joint space narrowing. A lesser degree of
marked cartilage loss about the right hip is noted.

Labrum:  The left labrum is severely degenerated.

Joint or bursal effusion

Joint effusion: There is a moderate left hip joint effusion
containing debris. Marked synovial enhancement about the left hip is
identified.

Bursae:  There is some fluid in the left trochanteric bursa.

Muscles and tendons

Muscles and tendons: The left gluteus minimus and medius tendons
appear completely torn from the greater trochanter. The left
semimembranosus tendon is completely torn from the ischial
tuberosity and there is high-grade partial tearing of the conjoint
semitendinosis and biceps femoris tendons from the left ischial
tuberosity. Rim enhancing fluid off the left ischial tuberosity
measures 2.5 cm transverse x 3.4 cm AP x 4.6 cm craniocaudal. Mild
appearing tendinosis of the right hamstring origin without tear is
also identified.

Other findings

Miscellaneous: The patient is status post hysterectomy. There is
partial visualization of lower lumbar fusion.
IMPRESSION: Severe left hip osteoarthritis. Flattening of the left femoral head
is consistent with remodeling and/or subchondral fracture with
associated marrow edema and enhancement. Intense synovitis is
present about the left hip. Septic joint is possible but thought
unlikely.

Complete tear of the left semimembranosus tendon and high-grade
partial tearing of the conjoint semitendinosis and biceps femoris
tendons from the left ischial tuberosity. Rim enhancing fluid
subjacent to the tuberosity is presumably related to tenosynovitis
and tendon tearing rather than abscess although abscess cannot be
completely excluded.

Trochanteric bursitis on the left with associated complete tears of
the left gluteus medius and minimus tendons from the greater
trochanter of the femur.

Lucent lesions in the left ischial tuberosity and greater trochanter
have a benign appearance. No evidence of neoplastic process is
identified on the exam.

## 2017-08-11 DIAGNOSIS — M19012 Primary osteoarthritis, left shoulder: Secondary | ICD-10-CM | POA: Diagnosis not present

## 2017-08-11 DIAGNOSIS — Z471 Aftercare following joint replacement surgery: Secondary | ICD-10-CM | POA: Diagnosis not present

## 2017-08-11 DIAGNOSIS — Z96612 Presence of left artificial shoulder joint: Secondary | ICD-10-CM | POA: Diagnosis not present

## 2017-10-13 DIAGNOSIS — M25522 Pain in left elbow: Secondary | ICD-10-CM | POA: Diagnosis not present

## 2017-10-13 DIAGNOSIS — G894 Chronic pain syndrome: Secondary | ICD-10-CM | POA: Diagnosis not present

## 2017-10-13 DIAGNOSIS — F329 Major depressive disorder, single episode, unspecified: Secondary | ICD-10-CM | POA: Diagnosis not present

## 2017-10-13 DIAGNOSIS — I471 Supraventricular tachycardia: Secondary | ICD-10-CM | POA: Diagnosis not present

## 2017-10-13 DIAGNOSIS — M961 Postlaminectomy syndrome, not elsewhere classified: Secondary | ICD-10-CM | POA: Diagnosis not present

## 2017-10-13 DIAGNOSIS — M25422 Effusion, left elbow: Secondary | ICD-10-CM | POA: Diagnosis not present

## 2017-10-13 DIAGNOSIS — I10 Essential (primary) hypertension: Secondary | ICD-10-CM | POA: Diagnosis not present

## 2017-11-09 DIAGNOSIS — Z79899 Other long term (current) drug therapy: Secondary | ICD-10-CM | POA: Diagnosis not present

## 2017-11-09 DIAGNOSIS — M129 Arthropathy, unspecified: Secondary | ICD-10-CM | POA: Diagnosis not present

## 2017-11-09 DIAGNOSIS — M545 Low back pain: Secondary | ICD-10-CM | POA: Diagnosis not present

## 2017-11-09 DIAGNOSIS — M542 Cervicalgia: Secondary | ICD-10-CM | POA: Diagnosis not present

## 2017-11-09 DIAGNOSIS — G8929 Other chronic pain: Secondary | ICD-10-CM | POA: Diagnosis not present

## 2017-11-22 DIAGNOSIS — Z79899 Other long term (current) drug therapy: Secondary | ICD-10-CM | POA: Diagnosis not present

## 2017-11-22 DIAGNOSIS — M5136 Other intervertebral disc degeneration, lumbar region: Secondary | ICD-10-CM | POA: Diagnosis not present

## 2017-11-22 DIAGNOSIS — M542 Cervicalgia: Secondary | ICD-10-CM | POA: Diagnosis not present

## 2017-11-22 DIAGNOSIS — G8929 Other chronic pain: Secondary | ICD-10-CM | POA: Diagnosis not present

## 2017-11-22 DIAGNOSIS — M810 Age-related osteoporosis without current pathological fracture: Secondary | ICD-10-CM | POA: Diagnosis not present

## 2017-11-22 DIAGNOSIS — M545 Low back pain: Secondary | ICD-10-CM | POA: Diagnosis not present

## 2017-12-28 DIAGNOSIS — M542 Cervicalgia: Secondary | ICD-10-CM | POA: Diagnosis not present

## 2017-12-28 DIAGNOSIS — Z79899 Other long term (current) drug therapy: Secondary | ICD-10-CM | POA: Diagnosis not present

## 2017-12-28 DIAGNOSIS — M5030 Other cervical disc degeneration, unspecified cervical region: Secondary | ICD-10-CM | POA: Diagnosis not present

## 2017-12-28 DIAGNOSIS — G8929 Other chronic pain: Secondary | ICD-10-CM | POA: Diagnosis not present

## 2017-12-28 DIAGNOSIS — M545 Low back pain: Secondary | ICD-10-CM | POA: Diagnosis not present

## 2017-12-31 ENCOUNTER — Other Ambulatory Visit: Payer: Self-pay

## 2017-12-31 ENCOUNTER — Emergency Department (HOSPITAL_COMMUNITY)
Admission: EM | Admit: 2017-12-31 | Discharge: 2017-12-31 | Disposition: A | Discharge status: Home / Self Care | Payer: PPO | Attending: Emergency Medicine | Admitting: Emergency Medicine

## 2017-12-31 ENCOUNTER — Emergency Department (HOSPITAL_COMMUNITY): Payer: PPO

## 2017-12-31 DIAGNOSIS — J45909 Unspecified asthma, uncomplicated: Secondary | ICD-10-CM | POA: Diagnosis not present

## 2017-12-31 DIAGNOSIS — Z79899 Other long term (current) drug therapy: Secondary | ICD-10-CM | POA: Diagnosis not present

## 2017-12-31 DIAGNOSIS — M25422 Effusion, left elbow: Secondary | ICD-10-CM

## 2017-12-31 DIAGNOSIS — S59902A Unspecified injury of left elbow, initial encounter: Secondary | ICD-10-CM | POA: Diagnosis not present

## 2017-12-31 DIAGNOSIS — W19XXXA Unspecified fall, initial encounter: Secondary | ICD-10-CM

## 2017-12-31 DIAGNOSIS — M25522 Pain in left elbow: Secondary | ICD-10-CM | POA: Diagnosis not present

## 2017-12-31 MED ORDER — OXYCODONE HCL 5 MG PO TABS
20.0000 mg | ORAL_TABLET | Freq: Once | ORAL | Status: AC
  Administered 2017-12-31: 20 mg via ORAL
  Filled 2017-12-31: qty 4

## 2017-12-31 MED ORDER — OXYCODONE HCL 5 MG PO TABS: 20.0000 mg | ORAL_TABLET | Freq: Once | ORAL | Status: DC

## 2017-12-31 NOTE — ED Provider Notes (Signed)
[facility] EMERGENCY DEPARTMENT Provider Note   CSN: [id number] Arrival date & time: 12/31/17  1434     History   Chief Complaint Chief Complaint  Patient presents with  . Fall    HPI Shirley [name] is a 77 y.o. female.  HPI  77 year old female with a history of chronic pain and prior closed olecranon fracture on the left side presents with elbow pain after a fall.  She is currently visiting her husband in the emergency department and she fell.  Falling is a chronic and recurrent problem for her and it was not different than typical today.  She hit her left elbow on the ground.  It has become swollen and painful.  Small abrasion.  She has not taken her oxycodone today  Past Medical History:  Diagnosis Date  . Allergy    vasomotor rhinitis  . Anemia   . Asthma    h/o asthma and bronchitis  . BV (bacterial vaginosis)    history of frequent  . Cervical spondylosis    herniated disk protruding @ C6-7, impinging cord and left axillary root sleeve.  . Chronic pain   . Closed olecranon fracture, left, initial encounter   . Depression   . DVT of upper extremity (deep vein thrombosis) ([place])    h/o left(after wrist fracture/surgery); no residual thrombus or phlebitis by Dopplers July 2014  . Dyspnea    with exertion  . Edema   . Fibromyalgia    sees Dr.Phillips-pain management  . GERD (gastroesophageal reflux disease)    h/o  . Headache    marigraine- hormal - none in years  . Heart murmur   . Hypertension    never has been treated that daughter is aware  . Neuropathy   . Neuropathy   . Osteopenia    mild at hip (T-1.3)  . PAT (paroxysmal atrial tachycardia) ([place])   . Pneumonia    years ago  . Recurrent falls 04/16/2016  . Varicose veins   . Varicose veins of both legs with edema 08/2012   Also pain; bilateral GSV dilated with reflux, R Short SV dilated with reflux; not amenable to VNUS ablation due to tortuosity and superficial nature of veins --  recommeded referral to Dr. Eilleen Kempf @ [facility]  . Vision problem    wears glasses  . Vitamin D deficiency     Patient Active Problem List   Diagnosis Date Noted  . 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 OA (osteoarthritis) of hip 08/18/2016  . Pre-operative cardiovascular examination 07/16/2016  . Recurrent falls 04/16/2016  . Chronic pain   . Hypertension   . Neuropathy   . Osteopenia   . Vitamin D deficiency   . Olecranon fracture 04/15/2016  . Fall at home, initial encounter   . Fall   . Anemia   . Post-operative pain   . Fibromyalgia   . Uncomplicated asthma   . Tobacco abuse   . AKI (acute kidney injury) ([place])   . Leukocytosis   . Other secondary hypertension   . Hyperglycemia   . Acute blood loss anemia   . Thrombocytosis ([place])   . Closed fracture of left olecranon process   . Acute renal failure ([place]) 03/18/2016  . Acute encephalopathy 03/18/2016  . Elbow fracture, left, closed, initial encounter 03/18/2016  . Acute pain of left hip 12/23/2015  . UTI (urinary tract infection) 12/23/2015  . DNR (do not resuscitate) discussion 12/23/2015  . Palliative care  by specialist 12/23/2015  . Chronic pain syndrome 12/23/2015  . Left hip pain 12/23/2015  . Effusion of left hip   . Lytic bone lesions on xray   . Exertional dyspnea 09/07/2012  . Varicose veins of both lower extremities with pain 08/27/2012  . Lower extremity edema 08/27/2012  . Abnormal resting ECG findings - sinus rhythm with PACs, and PAT 08/19/2012  . Aortic systolic murmur on examination 08/19/2012  . PAT (paroxysmal atrial tachycardia) / MAT 08/19/2012    Past Surgical History:  Procedure Laterality Date  . ABDOMINAL HYSTERECTOMY  1999   BSO for endometriosis  . APPENDECTOMY    . BACK SURGERY  age 25   ruptured disk L3-4   . CATARACT EXTRACTION  2000   B/L  . COLONOSCOPY    . FINGER SURGERY     left finger for tender rupture from fall.  Marland Kitchen FOOT SURGERY     multiple(at  least 4 on right, 5 on left)-left Dr.Kerner([place])11/08,left Dr.Nunley-excision 2nd and 3rd mt heads w/artho and hammertoe surgery 4th and 5th 04/2006. left great toe IP fusion and revision of fusion of 2nd through 5th toes 12/2005. Right foot surgeries  Dr.Bednarz 04/2008 and 04/2009.  Marland Kitchen KNEE SURGERY Right    right knee replacement  . NECK SURGERY  2007   cervical  . ORIF ELBOW FRACTURE Left 03/22/2016   Procedure: OPEN REDUCTION INTERNAL FIXATION (ORIF) ELBOW/OLECRANON FRACTURE;  Surgeon: Altamese St. Hilaire, MD;  Location: [facility];  Service: Orthopedics;  Laterality: Left;  . ORIF ELBOW FRACTURE Left 04/15/2016   Procedure: OPEN REDUCTION INTERNAL FIXATION (ORIF) LEFT ELBOW/OLECRANON FRACTURE;  Surgeon: Altamese Cedar Vale, MD;  Location: [facility];  Service: Orthopedics;  Laterality: Left;  . RADIOLOGY WITH ANESTHESIA Left 01/06/2016   Procedure: MRI LEFT HIP WITH AND WITHOUT;  Surgeon: Medication Radiologist, MD;  Location: [facility];  Service: Radiology;  Laterality: Left;  . REFRACTIVE SURGERY  2007   left eye  . REVERSE SHOULDER ARTHROPLASTY Left 12/31/2016   Procedure: REVERSE LEFT SHOULDER ARTHROPLASTY;  Surgeon: Netta Cedars, MD;  Location: [facility];  Service: Orthopedics;  Laterality: Left;  . SHOULDER SURGERY     x8 (4 on rt side and 4 on left side)  . SPINAL FUSION  6/09   Dr.Cohen  . STERIOD INJECTION Left 04/15/2016   Procedure: LEFT HIP STEROID INJECTION;  Surgeon: Altamese [place], MD;  Location: [facility];  Service: Orthopedics;  Laterality: Left;  . TONSILLECTOMY AND ADENOIDECTOMY    . TOTAL HIP ARTHROPLASTY Left 08/18/2016   Procedure: LEFT TOTAL HIP ARTHROPLASTY ANTERIOR APPROACH;  Surgeon: Gaynelle Arabian, MD;  Location: [facility];  Service: Orthopedics;  Laterality: Left;  . TRANSTHORACIC ECHOCARDIOGRAM  July 2012   Normal EF 60-65% , mild concentric LVH. Grade 2 diastolic dysfunction with elevated EDP. Massive left atrial dilation. Pulmonary pressures 30-40 mm Hg; aortic sclerosis but no stenosis. Moderate  TR  . TRANSTHORACIC ECHOCARDIOGRAM  03/2016   EF 65-70%. Normal wall motion. GR 1 DD. Severe LA dilation. Mild to moderate TR with mild to moderately elevated PA pressures (44 mmHg) mild aortic calcification but no stenosis. No source of emboli   . WRIST SURGERY Left    2 surgeries left wrist after fracture(complicated by DVT)     OB History   None      Home Medications    Prior to Admission medications   Medication Sig Start Date End Date Taking? Authorizing Provider  ALPRAZolam Duanne Moron) 0.5 MG tablet Take 0.5-1 mg by mouth 3 (three)  times daily as needed for anxiety or sleep.     [provider]  Carboxymethylcellulose Sodium (MOISTURIZING LUBRICANT EYE OP) Place 1 drop into both eyes as needed (for dry eyes).     [provider]  Cholecalciferol (VITAMIN D3) 5000 units CAPS Take 5,000 Units by mouth 2 (two) times daily.    [provider]  fluticasone (FLONASE) 50 MCG/ACT nasal spray Place 2 sprays into both nostrils daily as needed for allergies or rhinitis.     [provider]  gabapentin (NEURONTIN) 300 MG capsule Take 300-900 mg by mouth See admin instructions. Take 300 mg by mouth in the morning, take 300 mg by mouth in the afternoon and take 900 mg by mouth at bedtime    [provider]  ibuprofen (ADVIL,MOTRIN) 200 MG tablet Take 800 mg by mouth every 6 (six) hours as needed for headache or moderate pain.    [provider]  metoprolol tartrate (LOPRESSOR) 25 MG tablet Take 1 tablet (25 mg total) by mouth 2 (two) times daily. Patient taking differently: Take 12.5 mg by mouth 2 (two) times daily.  03/24/16   Rosita Fire, MD  oxycodone (ROXICODONE) 30 MG immediate release tablet Take 1 tablet (30 mg total) by mouth every 4 (four) hours as needed for pain (for pain). Reported on 06/03/2015 Patient taking differently: Take 30 mg by mouth every 4 (four) hours as needed for pain.  04/17/16   Shepperson, Kirstin, PA-C    oxycodone (ROXICODONE) 30 MG immediate release tablet Take 1 tablet (30 mg total) every 4 (four) hours as needed by mouth for severe pain. 12/31/16   Netta Cedars, MD  rivaroxaban (XARELTO) 10 MG TABS tablet Take 1 tablet (10 mg total) by mouth daily with breakfast. Take Xarelto for two and a half more weeks following discharge from the hospital, then discontinue Xarelto. Once the patient has completed the blood thinner regimen, then take a Baby 81 mg Aspirin daily for three more weeks. Patient not taking: Reported on 12/23/2016 08/21/16   Dara Lords, Alexzandrew L, PA-C  sertraline (ZOLOFT) 100 MG tablet Take 100 mg by mouth 2 (two) times daily.  02/10/16 02/09/17  [provider]  tiZANidine (ZANAFLEX) 4 MG tablet Take 1 tablet (4 mg total) every 8 (eight) hours as needed by mouth for muscle spasms. 12/31/16 12/31/17  Netta Cedars, MD  torsemide (DEMADEX) 10 MG tablet Take 10 mg by mouth daily as needed (for fluid).  01/13/15   [provider]    Family History Family History  Problem Relation Age of Onset  . Stroke Mother   . Osteoarthritis Mother        Died at age 72  . Heart disease Father   . Lung cancer Father   . Heart failure Father        Died at age 36  . Osteoporosis Sister   . Diabetes Brother   . Heart attack Brother 64  . Diabetes Brother   . Diabetes Brother   . Heart attack Brother 42  . Breast cancer Paternal Aunt   . Heart failure Maternal Grandmother        Died at age 66, unsure of her heart disease  . Heart failure Paternal Grandfather        Died at age 32, unsure of cardiac disease.    Social History Social History   Tobacco Use  . Smoking status: Former Research scientist (life sciences)  . Smokeless tobacco: Never Used  . Tobacco comment: doesnt smoke  every day  Substance Use Topics  . Alcohol use: No    Alcohol/week: 0.0 standard drinks  . Drug use: No     Allergies   Patient has no known allergies.   Review of Systems Review of Systems   Musculoskeletal: Positive for arthralgias and joint swelling.  Skin: Positive for wound.     Physical Exam Updated Vital Signs BP (!) 149/83   Pulse 60   Temp 97.6 F (36.4 C)   Resp 16   Ht 5\' 4"  (1.626 m)   Wt 72.6 kg   SpO2 98%   BMI 27.46 kg/m   Physical Exam  Constitutional: She appears well-developed and well-nourished. No distress.  HENT:  Head: Normocephalic and atraumatic.  Nose: Nose normal.  Eyes: Right eye exhibits no discharge. Left eye exhibits no discharge.  Cardiovascular: Normal rate and regular rhythm.  Pulses:      Radial pulses are 2+ on the left side.  Pulmonary/Chest: Effort normal.  Musculoskeletal:       Left elbow: She exhibits decreased range of motion and swelling. Tenderness found.       Left upper arm: She exhibits no tenderness.       Left forearm: She exhibits no tenderness.  Normal strength and sensation in left arm.  Mildly decreased range of motion of left elbow.  Difficult to find a specific area of tenderness but the pain mostly is present with flexion/extension.  Neurological: She is alert.  Skin: Skin is warm and dry. She is not diaphoretic.  Psychiatric: Her mood appears not anxious.  Nursing note and vitals reviewed.    ED Treatments / Results  Labs (all labs ordered are listed, but only abnormal results are displayed) Labs Reviewed - No data to display  EKG None  Radiology Dg Elbow Complete Left  Result Date: 12/31/2017 CLINICAL DATA:  Anterior left elbow pain s/p fall today while visiting her husband at the hospital. Pt has hx of 2x left elbow fractures and ORIF's to repair. Pt is unable to turn her hand over into supination for AP imaging and external oblique imaging. The AP image was take in a cross-table orientation with the pt's arm resting on a sponge and a special orthopedic view was taken to demonstrate the radial head. EXAM: LEFT ELBOW - COMPLETE 3+ VIEW COMPARISON:  10/13/2017 FINDINGS: There is a persistent  ununited fracture component from the proximal olecranon. This is unchanged from the prior exam. Posterior fusion plate and fixation screws lie along the proximal ulna, well-seated and unchanged. No acute fracture.  Elbow joint normally aligned. There is a positive joint effusion evident both anteriorly and posteriorly. IMPRESSION: 1. No acute fracture.  No dislocation. 2. Chronic ununited fracture of the olecranon. Prior ORIF of the proximal ulna with well-seated orthopedic hardware. 3. Positive joint effusion. Electronically Signed   By: Lajean Manes M.D.   On: 12/31/2017 15:37    Procedures Procedures (including critical care time)  Medications Ordered in ED Medications  oxyCODONE (Oxy IR/ROXICODONE) immediate release tablet 20 mg (20 mg Oral Given 12/31/17 1534)     Initial Impression / Assessment and Plan / ED Course  I have reviewed the triage vital signs and the nursing notes.  Pertinent labs & imaging results that were available during my care of the patient were reviewed by me and considered in my medical decision making (see chart for details).     Patient is neurovascular intact.  No fracture or dislocation seen on x-ray.  She is  swollen and likely has a reactive joint effusion as she states she had no swelling, pain, or any other abnormalities besides chronic problems with her elbow.  Thus this is unlikely to be a septic joint.  No overlying skin changes or fever.  She appears stable for discharge home after oral pain control.  Falls are a chronic issue for her.  Final Clinical Impressions(s) / ED Diagnoses   Final diagnoses:  Fall, initial encounter  Effusion of left elbow    ED Discharge Orders    None       Sherwood Gambler, MD 12/31/17 [phone]

## 2017-12-31 NOTE — ED Notes (Signed)
Patient transported to X-ray 

## 2017-12-31 NOTE — ED Notes (Signed)
Daughter is giving patient ride home d/t administration of pain medication.

## 2017-12-31 NOTE — ED Triage Notes (Addendum)
Pt here visiting husband when she had an unwitnessed fall in her husbands hospital room in the emergency department. Pt reports falling and "scraping" her left elbow. Denied LOC, N/V. No obvious deformities noted.

## 2017-12-31 NOTE — ED Notes (Signed)
Patient verbalizes understanding of discharge instructions. Opportunity for questioning and answers were provided. Armband removed by staff, pt discharged from ED.  

## 2018-01-14 DIAGNOSIS — J22 Unspecified acute lower respiratory infection: Secondary | ICD-10-CM | POA: Diagnosis not present

## 2018-01-26 DIAGNOSIS — M47812 Spondylosis without myelopathy or radiculopathy, cervical region: Secondary | ICD-10-CM | POA: Diagnosis not present

## 2018-01-26 DIAGNOSIS — M545 Low back pain: Secondary | ICD-10-CM | POA: Diagnosis not present

## 2018-01-26 DIAGNOSIS — Z79899 Other long term (current) drug therapy: Secondary | ICD-10-CM | POA: Diagnosis not present

## 2018-01-26 DIAGNOSIS — G8929 Other chronic pain: Secondary | ICD-10-CM | POA: Diagnosis not present

## 2018-01-26 DIAGNOSIS — M4120 Other idiopathic scoliosis, site unspecified: Secondary | ICD-10-CM | POA: Diagnosis not present

## 2018-02-08 DIAGNOSIS — G8191 Hemiplegia, unspecified affecting right dominant side: Secondary | ICD-10-CM | POA: Diagnosis not present

## 2018-02-09 DIAGNOSIS — R531 Weakness: Secondary | ICD-10-CM | POA: Diagnosis not present

## 2018-02-09 DIAGNOSIS — R4189 Other symptoms and signs involving cognitive functions and awareness: Secondary | ICD-10-CM | POA: Diagnosis not present

## 2018-02-09 DIAGNOSIS — R4689 Other symptoms and signs involving appearance and behavior: Secondary | ICD-10-CM | POA: Diagnosis not present

## 2018-02-09 DIAGNOSIS — R609 Edema, unspecified: Secondary | ICD-10-CM | POA: Diagnosis not present

## 2018-02-17 DIAGNOSIS — I998 Other disorder of circulatory system: Secondary | ICD-10-CM | POA: Diagnosis not present

## 2018-02-17 DIAGNOSIS — I739 Peripheral vascular disease, unspecified: Secondary | ICD-10-CM | POA: Diagnosis not present

## 2018-02-17 DIAGNOSIS — I639 Cerebral infarction, unspecified: Secondary | ICD-10-CM | POA: Diagnosis not present

## 2018-02-17 DIAGNOSIS — R531 Weakness: Secondary | ICD-10-CM | POA: Diagnosis not present

## 2018-02-21 DIAGNOSIS — R531 Weakness: Secondary | ICD-10-CM | POA: Diagnosis not present

## 2018-02-21 DIAGNOSIS — M25541 Pain in joints of right hand: Secondary | ICD-10-CM | POA: Diagnosis not present

## 2018-02-21 DIAGNOSIS — M6281 Muscle weakness (generalized): Secondary | ICD-10-CM | POA: Diagnosis not present

## 2018-02-21 DIAGNOSIS — M545 Low back pain: Secondary | ICD-10-CM | POA: Diagnosis not present

## 2018-02-21 DIAGNOSIS — M25551 Pain in right hip: Secondary | ICD-10-CM | POA: Diagnosis not present

## 2018-02-23 DIAGNOSIS — Z79899 Other long term (current) drug therapy: Secondary | ICD-10-CM | POA: Diagnosis not present

## 2018-02-23 DIAGNOSIS — M545 Low back pain: Secondary | ICD-10-CM | POA: Diagnosis not present

## 2018-02-23 DIAGNOSIS — G8929 Other chronic pain: Secondary | ICD-10-CM | POA: Diagnosis not present

## 2018-02-28 DIAGNOSIS — R531 Weakness: Secondary | ICD-10-CM | POA: Diagnosis not present

## 2018-02-28 DIAGNOSIS — M25551 Pain in right hip: Secondary | ICD-10-CM | POA: Diagnosis not present

## 2018-02-28 DIAGNOSIS — M545 Low back pain: Secondary | ICD-10-CM | POA: Diagnosis not present

## 2018-02-28 DIAGNOSIS — R278 Other lack of coordination: Secondary | ICD-10-CM | POA: Diagnosis not present

## 2018-02-28 DIAGNOSIS — M25511 Pain in right shoulder: Secondary | ICD-10-CM | POA: Diagnosis not present

## 2018-02-28 DIAGNOSIS — M25541 Pain in joints of right hand: Secondary | ICD-10-CM | POA: Diagnosis not present

## 2018-02-28 DIAGNOSIS — M6281 Muscle weakness (generalized): Secondary | ICD-10-CM | POA: Diagnosis not present

## 2018-03-03 DIAGNOSIS — R531 Weakness: Secondary | ICD-10-CM | POA: Diagnosis not present

## 2018-03-03 DIAGNOSIS — M25541 Pain in joints of right hand: Secondary | ICD-10-CM | POA: Diagnosis not present

## 2018-03-03 DIAGNOSIS — R278 Other lack of coordination: Secondary | ICD-10-CM | POA: Diagnosis not present

## 2018-03-03 DIAGNOSIS — M25511 Pain in right shoulder: Secondary | ICD-10-CM | POA: Diagnosis not present

## 2018-03-03 DIAGNOSIS — M25551 Pain in right hip: Secondary | ICD-10-CM | POA: Diagnosis not present

## 2018-03-03 DIAGNOSIS — M545 Low back pain: Secondary | ICD-10-CM | POA: Diagnosis not present

## 2018-03-03 DIAGNOSIS — M6281 Muscle weakness (generalized): Secondary | ICD-10-CM | POA: Diagnosis not present

## 2018-03-07 DIAGNOSIS — M25551 Pain in right hip: Secondary | ICD-10-CM | POA: Diagnosis not present

## 2018-03-07 DIAGNOSIS — R278 Other lack of coordination: Secondary | ICD-10-CM | POA: Diagnosis not present

## 2018-03-07 DIAGNOSIS — M25511 Pain in right shoulder: Secondary | ICD-10-CM | POA: Diagnosis not present

## 2018-03-07 DIAGNOSIS — M6281 Muscle weakness (generalized): Secondary | ICD-10-CM | POA: Diagnosis not present

## 2018-03-07 DIAGNOSIS — R531 Weakness: Secondary | ICD-10-CM | POA: Diagnosis not present

## 2018-03-07 DIAGNOSIS — M25541 Pain in joints of right hand: Secondary | ICD-10-CM | POA: Diagnosis not present

## 2018-03-07 DIAGNOSIS — M545 Low back pain: Secondary | ICD-10-CM | POA: Diagnosis not present

## 2018-03-10 DIAGNOSIS — M25511 Pain in right shoulder: Secondary | ICD-10-CM | POA: Diagnosis not present

## 2018-03-10 DIAGNOSIS — M6281 Muscle weakness (generalized): Secondary | ICD-10-CM | POA: Diagnosis not present

## 2018-03-10 DIAGNOSIS — M25551 Pain in right hip: Secondary | ICD-10-CM | POA: Diagnosis not present

## 2018-03-10 DIAGNOSIS — M25541 Pain in joints of right hand: Secondary | ICD-10-CM | POA: Diagnosis not present

## 2018-03-10 DIAGNOSIS — R531 Weakness: Secondary | ICD-10-CM | POA: Diagnosis not present

## 2018-03-10 DIAGNOSIS — R278 Other lack of coordination: Secondary | ICD-10-CM | POA: Diagnosis not present

## 2018-03-10 DIAGNOSIS — M545 Low back pain: Secondary | ICD-10-CM | POA: Diagnosis not present

## 2018-03-14 DIAGNOSIS — M25541 Pain in joints of right hand: Secondary | ICD-10-CM | POA: Diagnosis not present

## 2018-03-14 DIAGNOSIS — M25511 Pain in right shoulder: Secondary | ICD-10-CM | POA: Diagnosis not present

## 2018-03-14 DIAGNOSIS — M6281 Muscle weakness (generalized): Secondary | ICD-10-CM | POA: Diagnosis not present

## 2018-03-14 DIAGNOSIS — R531 Weakness: Secondary | ICD-10-CM | POA: Diagnosis not present

## 2018-03-14 DIAGNOSIS — M25551 Pain in right hip: Secondary | ICD-10-CM | POA: Diagnosis not present

## 2018-03-14 DIAGNOSIS — M545 Low back pain: Secondary | ICD-10-CM | POA: Diagnosis not present

## 2018-03-14 DIAGNOSIS — R278 Other lack of coordination: Secondary | ICD-10-CM | POA: Diagnosis not present

## 2018-03-17 DIAGNOSIS — M545 Low back pain: Secondary | ICD-10-CM | POA: Diagnosis not present

## 2018-03-17 DIAGNOSIS — M25511 Pain in right shoulder: Secondary | ICD-10-CM | POA: Diagnosis not present

## 2018-03-17 DIAGNOSIS — M25541 Pain in joints of right hand: Secondary | ICD-10-CM | POA: Diagnosis not present

## 2018-03-17 DIAGNOSIS — R531 Weakness: Secondary | ICD-10-CM | POA: Diagnosis not present

## 2018-03-17 DIAGNOSIS — M6281 Muscle weakness (generalized): Secondary | ICD-10-CM | POA: Diagnosis not present

## 2018-03-17 DIAGNOSIS — R278 Other lack of coordination: Secondary | ICD-10-CM | POA: Diagnosis not present

## 2018-03-17 DIAGNOSIS — M25551 Pain in right hip: Secondary | ICD-10-CM | POA: Diagnosis not present

## 2018-03-24 DIAGNOSIS — M545 Low back pain: Secondary | ICD-10-CM | POA: Diagnosis not present

## 2018-03-24 DIAGNOSIS — R278 Other lack of coordination: Secondary | ICD-10-CM | POA: Diagnosis not present

## 2018-03-24 DIAGNOSIS — M25551 Pain in right hip: Secondary | ICD-10-CM | POA: Diagnosis not present

## 2018-03-24 DIAGNOSIS — M25541 Pain in joints of right hand: Secondary | ICD-10-CM | POA: Diagnosis not present

## 2018-03-24 DIAGNOSIS — M25511 Pain in right shoulder: Secondary | ICD-10-CM | POA: Diagnosis not present

## 2018-03-24 DIAGNOSIS — R531 Weakness: Secondary | ICD-10-CM | POA: Diagnosis not present

## 2018-03-24 DIAGNOSIS — M6281 Muscle weakness (generalized): Secondary | ICD-10-CM | POA: Diagnosis not present

## 2018-03-30 DIAGNOSIS — M5030 Other cervical disc degeneration, unspecified cervical region: Secondary | ICD-10-CM | POA: Diagnosis not present

## 2018-03-30 DIAGNOSIS — M5136 Other intervertebral disc degeneration, lumbar region: Secondary | ICD-10-CM | POA: Diagnosis not present

## 2018-03-30 DIAGNOSIS — Z79899 Other long term (current) drug therapy: Secondary | ICD-10-CM | POA: Diagnosis not present

## 2018-04-06 DIAGNOSIS — R278 Other lack of coordination: Secondary | ICD-10-CM | POA: Diagnosis not present

## 2018-04-06 DIAGNOSIS — M25551 Pain in right hip: Secondary | ICD-10-CM | POA: Diagnosis not present

## 2018-04-06 DIAGNOSIS — R531 Weakness: Secondary | ICD-10-CM | POA: Diagnosis not present

## 2018-04-06 DIAGNOSIS — M25511 Pain in right shoulder: Secondary | ICD-10-CM | POA: Diagnosis not present

## 2018-04-06 DIAGNOSIS — M25541 Pain in joints of right hand: Secondary | ICD-10-CM | POA: Diagnosis not present

## 2018-04-06 DIAGNOSIS — M6281 Muscle weakness (generalized): Secondary | ICD-10-CM | POA: Diagnosis not present

## 2018-04-06 DIAGNOSIS — M545 Low back pain: Secondary | ICD-10-CM | POA: Diagnosis not present

## 2018-04-11 DIAGNOSIS — M25551 Pain in right hip: Secondary | ICD-10-CM | POA: Diagnosis not present

## 2018-04-11 DIAGNOSIS — M545 Low back pain: Secondary | ICD-10-CM | POA: Diagnosis not present

## 2018-04-11 DIAGNOSIS — M25511 Pain in right shoulder: Secondary | ICD-10-CM | POA: Diagnosis not present

## 2018-04-11 DIAGNOSIS — R531 Weakness: Secondary | ICD-10-CM | POA: Diagnosis not present

## 2018-04-11 DIAGNOSIS — R278 Other lack of coordination: Secondary | ICD-10-CM | POA: Diagnosis not present

## 2018-04-11 DIAGNOSIS — M25541 Pain in joints of right hand: Secondary | ICD-10-CM | POA: Diagnosis not present

## 2018-04-11 DIAGNOSIS — M6281 Muscle weakness (generalized): Secondary | ICD-10-CM | POA: Diagnosis not present

## 2018-04-12 ENCOUNTER — Encounter: Payer: Self-pay | Admitting: *Deleted

## 2018-04-13 ENCOUNTER — Telehealth: Payer: Self-pay | Admitting: *Deleted

## 2018-04-13 DIAGNOSIS — R930 Abnormal findings on diagnostic imaging of skull and head, not elsewhere classified: Secondary | ICD-10-CM | POA: Diagnosis not present

## 2018-04-13 DIAGNOSIS — I10 Essential (primary) hypertension: Secondary | ICD-10-CM | POA: Diagnosis not present

## 2018-04-13 DIAGNOSIS — E041 Nontoxic single thyroid nodule: Secondary | ICD-10-CM | POA: Diagnosis not present

## 2018-04-13 DIAGNOSIS — Z23 Encounter for immunization: Secondary | ICD-10-CM | POA: Diagnosis not present

## 2018-04-13 DIAGNOSIS — R4189 Other symptoms and signs involving cognitive functions and awareness: Secondary | ICD-10-CM | POA: Diagnosis not present

## 2018-04-13 DIAGNOSIS — F4321 Adjustment disorder with depressed mood: Secondary | ICD-10-CM | POA: Diagnosis not present

## 2018-04-13 DIAGNOSIS — R9389 Abnormal findings on diagnostic imaging of other specified body structures: Secondary | ICD-10-CM | POA: Diagnosis not present

## 2018-04-13 DIAGNOSIS — M961 Postlaminectomy syndrome, not elsewhere classified: Secondary | ICD-10-CM | POA: Diagnosis not present

## 2018-04-13 DIAGNOSIS — Z0000 Encounter for general adult medical examination without abnormal findings: Secondary | ICD-10-CM | POA: Diagnosis not present

## 2018-04-13 NOTE — Telephone Encounter (Signed)
Reached daughter, [name] who had gotten VM. I rescheduled patient for next Wed. Advised they arrive 30 minutes early. [name] verbalized understanding, appreciation.

## 2018-04-13 NOTE — Telephone Encounter (Signed)
LVM for daughter, [name] on [place] advising her to call back today to reschedule patient's appointment tomorrow. Office will be closed due to bad weather.

## 2018-04-14 ENCOUNTER — Encounter

## 2018-04-14 ENCOUNTER — Ambulatory Visit: Payer: PPO | Admitting: Diagnostic Neuroimaging

## 2018-04-18 DIAGNOSIS — M6281 Muscle weakness (generalized): Secondary | ICD-10-CM | POA: Diagnosis not present

## 2018-04-18 DIAGNOSIS — R278 Other lack of coordination: Secondary | ICD-10-CM | POA: Diagnosis not present

## 2018-04-18 DIAGNOSIS — R531 Weakness: Secondary | ICD-10-CM | POA: Diagnosis not present

## 2018-04-18 DIAGNOSIS — M25511 Pain in right shoulder: Secondary | ICD-10-CM | POA: Diagnosis not present

## 2018-04-18 DIAGNOSIS — M25541 Pain in joints of right hand: Secondary | ICD-10-CM | POA: Diagnosis not present

## 2018-04-18 DIAGNOSIS — M25551 Pain in right hip: Secondary | ICD-10-CM | POA: Diagnosis not present

## 2018-04-18 DIAGNOSIS — M545 Low back pain: Secondary | ICD-10-CM | POA: Diagnosis not present

## 2018-04-19 ENCOUNTER — Ambulatory Visit: Payer: PPO | Admitting: Diagnostic Neuroimaging

## 2018-04-19 ENCOUNTER — Encounter: Payer: Self-pay | Admitting: Diagnostic Neuroimaging

## 2018-04-19 VITALS — BP 156/74 | HR 57 | Ht 64.0 in | Wt 166.6 lb

## 2018-04-19 DIAGNOSIS — I6930 Unspecified sequelae of cerebral infarction: Secondary | ICD-10-CM | POA: Diagnosis not present

## 2018-04-19 NOTE — Patient Instructions (Signed)
STROKE workup  - ULTRASOUND heart and carotid

## 2018-04-19 NOTE — Progress Notes (Addendum)
[facility]  PATIENT: Shirley [name] DOB: [birth year]  REFERRING CLINICIAN: S Hedgecock HISTORY FROM: patient and caregiver REASON FOR VISIT: new consult    HISTORICAL  CHIEF COMPLAINT:  Chief Complaint  Patient presents with  . New Patient (Initial Visit)    REferred by Ardith Dark, PA  . Extremity Weakness/ Stroke in 01/2018    R sided weakness / finished PT/OT yesterday.     HISTORY OF PRESENT ILLNESS:   78 year old female with hypertension, multiple orthopedic fractures and surgeries, gait and balance difficulty, multiple strokes, here for evaluation of recent stroke.  December 2019 patient had sudden onset of right hand numbness, clenching locked up right hand, lasting for a few hours.  Symptoms improved over time but did not resolve.  2 weeks later she saw PCP who ordered MRI of the brain.  This showed a subacute left MCA ischemic infarct.  Patient also has had 2 chronic left PCA infarcts sometime in the past.  She denies any unilateral vision loss problems.  Patient has gone through physical and occupational therapy.  Her right hand function has improved.    Referring PCP notes indicate that patient's daughter (who is not here today) was concerned about paranoid behavior, memory loss and dementia.  Patient herself denies any major memory problems.  I checked MMSE which was 26 out of 30.  She has significant limitations in her ADLs, mainly due to her physical limitations.  Patient was not on aspirin before her stroke.  Now she is on 81 mg aspirin per day.  I do not see any recent hemoglobin A1c or lipid profile in the computer system.   REVIEW OF SYSTEMS: Full 14 system review of systems performed and negative with exception of: Memory loss confusion numbness weakness tremor depression aching muscles incontinence swelling in legs.   ALLERGIES: No Known Allergies  HOME MEDICATIONS: Outpatient Medications Prior to Visit  Medication Sig Dispense  Refill  . ALPRAZolam (XANAX) 0.5 MG tablet Take 0.5-1 mg by mouth 3 (three) times daily as needed for anxiety or sleep.     Marland Kitchen aspirin EC 81 MG tablet Take 81 mg by mouth daily.    Marland Kitchen buPROPion (WELLBUTRIN XL) 300 MG 24 hr tablet Take 300 mg by mouth daily.    . Carboxymethylcellulose Sodium (MOISTURIZING LUBRICANT EYE OP) Place 1 drop into both eyes as needed (for dry eyes).     . Cholecalciferol (VITAMIN D3) 5000 units CAPS Take 5,000 Units by mouth 2 (two) times daily.    Marland Kitchen FLUoxetine (PROZAC) 20 MG capsule Take 20 mg by mouth daily.     . fluticasone (FLONASE) 50 MCG/ACT nasal spray Place 2 sprays into both nostrils daily as needed for allergies or rhinitis.     Marland Kitchen gabapentin (NEURONTIN) 300 MG capsule Take 300-900 mg by mouth See admin instructions. Take 300 mg by mouth in the morning, take 300 mg by mouth in the afternoon and take 900 mg by mouth at bedtime    . ibuprofen (ADVIL,MOTRIN) 200 MG tablet Take 800 mg by mouth every 6 (six) hours as needed for headache or moderate pain.    . metoprolol tartrate (LOPRESSOR) 25 MG tablet Take 1 tablet (25 mg total) by mouth 2 (two) times daily. 60 tablet 0  . oxyCODONE (ROXICODONE) 15 MG immediate release tablet TAKE 1 TABLET FIVE TIMES A DAY AS NEEDED    . torsemide (DEMADEX) 10 MG tablet Take 10 mg by mouth daily as needed (for fluid).     Marland Kitchen  FLUoxetine (PROZAC) 10 MG capsule Take 20 mg by mouth daily.     Marland Kitchen oxycodone (ROXICODONE) 30 MG immediate release tablet Take 1 tablet (30 mg total) every 4 (four) hours as needed by mouth for severe pain. 40 tablet 0  . rivaroxaban (XARELTO) 10 MG TABS tablet Take 1 tablet (10 mg total) by mouth daily with breakfast. Take Xarelto for two and a half more weeks following discharge from the hospital, then discontinue Xarelto. Once the patient has completed the blood thinner regimen, then take a Baby 81 mg Aspirin daily for three more weeks. 19 tablet 0  . oxycodone (ROXICODONE) 30 MG immediate release tablet Take 1  tablet (30 mg total) by mouth every 4 (four) hours as needed for pain (for pain). Reported on 06/03/2015 (Patient taking differently: Take 30 mg by mouth every 4 (four) hours as needed for pain. ) 60 tablet 0  . sertraline (ZOLOFT) 100 MG tablet Take 100 mg by mouth 2 (two) times daily.      No facility-administered medications prior to visit.     PAST MEDICAL HISTORY: Past Medical History:  Diagnosis Date  . Allergy    vasomotor rhinitis  . Anemia   . Asthma    h/o asthma and bronchitis  . BV (bacterial vaginosis)    history of frequent  . Cervical spondylosis    herniated disk protruding @ C6-7, impinging cord and left axillary root sleeve.  . Chronic pain   . Closed olecranon fracture, left, initial encounter   . Depression   . DVT of upper extremity (deep vein thrombosis) ([place])    h/o left(after wrist fracture/surgery); no residual thrombus or phlebitis by Dopplers July 2014  . Dyspnea    with exertion  . Edema   . Fibromyalgia    sees Dr.Phillips-pain management  . GERD (gastroesophageal reflux disease)    h/o  . Headache    marigraine- hormal - none in years  . Heart murmur   . Hypertension    never has been treated that daughter is aware  . Neuropathy   . Neuropathy   . Osteopenia    mild at hip (T-1.3)  . PAT (paroxysmal atrial tachycardia) ([place])   . Pneumonia    years ago  . Recurrent falls 04/16/2016  . Varicose veins   . 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 Eilleen Kempf @ [facility]  . Vision problem    wears glasses  . Vitamin D deficiency     PAST SURGICAL HISTORY: Past Surgical History:  Procedure Laterality Date  . ABDOMINAL HYSTERECTOMY  1999   BSO for endometriosis  . APPENDECTOMY    . BACK SURGERY  age 12   ruptured disk L3-4   . CATARACT  EXTRACTION  2000   B/L  . COLONOSCOPY    . FINGER SURGERY     left finger for tender rupture from fall.  Marland Kitchen FOOT SURGERY     multiple(at least 4 on right, 5 on left)-left Dr.Kerner([place])11/08,left Dr.Nunley-excision 2nd and 3rd mt heads w/artho and hammertoe surgery 4th and 5th 04/2006. left great toe IP fusion and revision of fusion of 2nd through 5th toes 12/2005. Right foot surgeries  Dr.Bednarz 04/2008 and 04/2009.  Marland Kitchen KNEE SURGERY Right    right knee replacement  . NECK  SURGERY  2007   cervical  . ORIF ELBOW FRACTURE Left 03/22/2016   Procedure: OPEN REDUCTION INTERNAL FIXATION (ORIF) ELBOW/OLECRANON FRACTURE;  Surgeon: Altamese Ringwood, MD;  Location: [facility];  Service: Orthopedics;  Laterality: Left;  . ORIF ELBOW FRACTURE Left 04/15/2016   Procedure: OPEN REDUCTION INTERNAL FIXATION (ORIF) LEFT ELBOW/OLECRANON FRACTURE;  Surgeon: Altamese Worthington, MD;  Location: [facility];  Service: Orthopedics;  Laterality: Left;  . RADIOLOGY WITH ANESTHESIA Left 01/06/2016   Procedure: MRI LEFT HIP WITH AND WITHOUT;  Surgeon: Medication Radiologist, MD;  Location: [facility];  Service: Radiology;  Laterality: Left;  . REFRACTIVE SURGERY  2007   left eye  . REVERSE SHOULDER ARTHROPLASTY Left 12/31/2016   Procedure: REVERSE LEFT SHOULDER ARTHROPLASTY;  Surgeon: Netta Cedars, MD;  Location: [facility];  Service: Orthopedics;  Laterality: Left;  . SHOULDER SURGERY     x8 (4 on rt side and 4 on left side)  . SPINAL FUSION  6/09   Dr.Cohen  . STERIOD INJECTION Left 04/15/2016   Procedure: LEFT HIP STEROID INJECTION;  Surgeon: Altamese Lincolnville, MD;  Location: [facility];  Service: Orthopedics;  Laterality: Left;  . TONSILLECTOMY AND ADENOIDECTOMY    . TOTAL HIP ARTHROPLASTY Left 08/18/2016   Procedure: LEFT TOTAL HIP ARTHROPLASTY ANTERIOR APPROACH;  Surgeon: Gaynelle Arabian, MD;  Location: [facility];  Service: Orthopedics;  Laterality: Left;  . TRANSTHORACIC ECHOCARDIOGRAM  July 2012   Normal EF 60-65% , mild concentric LVH. Grade 2  diastolic dysfunction with elevated EDP. Massive left atrial dilation. Pulmonary pressures 30-40 mm Hg; aortic sclerosis but no stenosis. Moderate TR  . TRANSTHORACIC ECHOCARDIOGRAM  03/2016   EF 65-70%. Normal wall motion. GR 1 DD. Severe LA dilation. Mild to moderate TR with mild to moderately elevated PA pressures (44 mmHg) mild aortic calcification but no stenosis. No source of emboli   . WRIST SURGERY Left    2 surgeries left wrist after fracture(complicated by DVT)    FAMILY HISTORY: Family History  Problem Relation Age of Onset  . Stroke Mother   . Osteoarthritis Mother        Died at age 79  . Heart disease Father   . Lung cancer Father   . Heart failure Father        Died at age 51  . Osteoporosis Sister   . Diabetes Brother   . Heart attack Brother 46  . Diabetes Brother   . Diabetes Brother   . Heart attack Brother 49  . Breast cancer Paternal Aunt   . Heart failure Maternal Grandmother        Died at age 31, unsure of her heart disease  . Heart failure Paternal Grandfather        Died at age 55, unsure of cardiac disease.    SOCIAL HISTORY: Social History   Socioeconomic History  . Marital status: Married    Spouse name: [place]  . Number of children: Not on file  . Years of education: Not on file  . Highest education level: Not on file  Occupational History  . Not on file  Social Needs  . Financial resource strain: Not on file  . Food insecurity:    Worry: Not on file    Inability: Not on file  . Transportation needs:    Medical: Not on file    Non-medical: Not on file  Tobacco Use  . Smoking status: Former Smoker    Last attempt to quit: 02/22/1958    Years since quitting:  60.1  . Smokeless tobacco: Never Used  . Tobacco comment: doesn't smoke every day  Substance and Sexual Activity  . Alcohol use: No    Alcohol/week: 0.0 standard drinks  . Drug use: No  . Sexual activity: Not on file  Lifestyle  . Physical activity:    Days per week: Not on  file    Minutes per session: Not on file  . Stress: Not on file  Relationships  . Social connections:    Talks on phone: Not on file    Gets together: Not on file    Attends religious service: Not on file    Active member of club or organization: Not on file    Attends meetings of clubs or organizations: Not on file    Relationship status: Not on file  . Intimate partner violence:    Fear of current or ex partner: Not on file    Emotionally abused: Not on file    Physically abused: Not on file    Forced sexual activity: Not on file  Other Topics Concern  . Not on file  Social History Narrative   Lives with husband.  3 children ([place], [place], [place]), 5 grandchildren.  Education trade school.  Retired.  Caffeine 2 x daily.       PHYSICAL EXAM  GENERAL EXAM/CONSTITUTIONAL: Vitals:  Vitals:   04/19/18 1454 04/19/18 1504  BP: (!) 171/67 (!) 156/74  Pulse: (!) 59 (!) 57  Weight: 166 lb 9.6 oz (75.6 kg)   Height: 5\' 4"  (1.626 m)      Body mass index is 28.6 kg/m. Wt Readings from Last 3 Encounters:  04/19/18 166 lb 9.6 oz (75.6 kg)  12/31/17 160 lb (72.6 kg)  12/28/16 164 lb 8 oz (74.6 kg)     Patient is in no distress; well developed, nourished and groomed; neck is supple  CARDIOVASCULAR:  Examination of carotid arteries is normal; no carotid bruits  Regular rate and rhythm, no murmurs  Examination of peripheral vascular system by observation and palpation is normal  EYES:  Ophthalmoscopic exam of optic discs and posterior segments is normal; no papilledema or hemorrhages  No exam data present  MUSCULOSKELETAL:  Gait, strength, tone, movements noted in Neurologic exam below  NEUROLOGIC: MENTAL STATUS:  MMSE - Mini Mental State Exam 04/19/2018  Orientation to time 5  Orientation to Place 5  Registration 3  Attention/ Calculation 3  Recall 3  Language- name 2 objects 1  Language- repeat 1  Language- follow 3 step command 3  Language- read &  follow direction 1  Write a sentence 1  Copy design 0  Total score 26    awake, alert, oriented to person, place and time  recent and remote memory intact  normal attention and concentration  language fluent, comprehension intact, naming intact  fund of knowledge appropriate  CRANIAL NERVE:   2nd - no papilledema on fundoscopic exam  2nd, 3rd, 4th, 6th - pupils equal and reactive to light, visual fields full to confrontation, extraocular muscles intact, no nystagmus  5th - facial sensation symmetric  7th - facial strength symmetric  8th - hearing intact  9th - palate elevates symmetrically, uvula midline  11th - shoulder shrug symmetric  12th - tongue protrusion midline  MOTOR:   SEVERE ARTHRITIS IN BILATERAL HANDS WITH INTRINSIC MUSCLE ATROPHY  LIMITED IN PROX UPPER AND LOWER EXT DUE TO PAIN  DISTAL STRENGTH 5  SENSORY:   normal and symmetric to light touch; DECR  IN FEET  COORDINATION:   finger-nose-finger, fine finger movements normal  REFLEXES:   deep tendon reflexes TRACE and symmetric  GAIT/STATION:   SEVERE SCOLIOSIS AND KYPHOSIS; VERY UNSTEADY; DOES NOT HAVE CANE OR WALKER     DIAGNOSTIC DATA (LABS, IMAGING, TESTING) - I reviewed patient records, labs, notes, testing and imaging myself where available.  Lab Results  Component Value Date   WBC 5.9 12/28/2016   HGB 9.6 (L) 01/01/2017   HCT 31.7 (L) 01/01/2017   MCV 90.2 12/28/2016   PLT 512 (H) 12/28/2016      Component Value Date/Time   NA 138 12/28/2016 1128   K 4.3 12/28/2016 1128   CL 105 12/28/2016 1128   CO2 25 12/28/2016 1128   GLUCOSE 116 (H) 12/28/2016 1128   BUN 15 12/28/2016 1128   CREATININE 1.10 (H) 12/28/2016 1128   CREATININE 0.66 07/19/2012 1651   CALCIUM 9.2 12/28/2016 1128   PROT 7.0 08/12/2016 1200   ALBUMIN 4.2 08/12/2016 1200   AST 19 08/12/2016 1200   ALT 12 (L) 08/12/2016 1200   ALKPHOS 73 08/12/2016 1200   BILITOT 0.3 08/12/2016 1200   GFRNONAA 47  (L) 12/28/2016 1128   GFRAA 54 (L) 12/28/2016 1128   Lab Results  Component Value Date   CHOL 152 03/18/2016   HDL 55 03/18/2016   LDLCALC 84 03/18/2016   TRIG 64 03/18/2016   CHOLHDL 2.8 03/18/2016   Lab Results  Component Value Date   HGBA1C 6.0 (H) 03/18/2016   Lab Results  Component Value Date   VITAMINB12 266 03/18/2016   Lab Results  Component Value Date   TSH 1.854 03/18/2016    02/04/07 MRI lumbar spine 1. Thoracolumbar scoliosis convex to the right with the apex at L1-2.  2. Left neural foraminal and lateral recess stenosis at L1-2 could cause neural compression on that side.  3. Moderate multifactorial stenosis at L2-3 with potential for neural compression in either lateral recess or in the intervertebral foramen on the left.  4. Severe multifactorial spinal stenosis at L3-4 that could cause neural compression on either side.  5. Lateral recess and foraminal narrowing, right more than left at L4-5. No definite neural compression.  6. Subarticular lateral recess and neural foraminal narrowing on the right at L5-S1. In particularly, it is possible the right S1 nerve root could be affected in this location.   11/21/12 MRI cervical spine [I reviewed images myself and agree with interpretation. -VRP]  - Little change in severe multilevel cervical spondylosis and facet arthrosis with central and foraminal stenosis detailed above.  04/30/15 CT T and L spine 1. Overall stable appearance of the thoracolumbar spine status post fusion from T10 through the upper sacrum. There is stable chronic loosening of the S1 pedicle screws. The right-sided interconnecting rod is chronically fractured just inferior to the right S1 pedicle screw. 2. Stable degenerative disc disease in the lower thoracic spine without resulting high-grade spinal stenosis. 3. Healing subacute right-sided rib fracture near the costovertebral junction at T9. 4. New sclerosis within the left sacrum suspicious  for an age indeterminate insufficiency fracture.  10/08/03 MRI brain [I reviewed images myself and agree with interpretation. -VRP]  - No acute infarct.  - Moderate nonspecific white matter-type changes as discussed above.  - Partial opacification right mastoid air cells.   03/18/16 MRI brain [I reviewed images myself and agree with interpretation. -VRP]  1. No acute finding. 2. Confluent cerebral white matter disease attributed to chronic microvascular ischemia, notably progressed  since 2005. 3. 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  03/18/16 TTE Impressions: - No cardiac source of emboli was indentified.  02/17/18 MRI brain [I reviewed images myself and agree with interpretation. 2 chronic left PCA infarcts noted. 1 was present on 2018 scan. -VRP]  1. Subacute appearing patchy infarct in the posterior Left MCA territory, perirolandic region in the right upper extremity representation area. No associated hemorrhage or mass effect. 2. Progressed chronic Left PCA territory ischemia since the 2018 MRI. 3. Underlying advanced chronic cerebral white matter signal changes favored due to chronic small vessel disease.    ASSESSMENT AND PLAN  78 y.o. year old female here with stroke and memory loss.    Dx:  1. Chronic ischemic left MCA stroke   2. Chronic ischemic left PCA stroke      PLAN:  STROKE workup (left MCA; left PCA x 2; ? Cardio-embolic) - check TTE, carotid u/s; if these tests are negative consider TEE and implanted loop recorder; however due to patient's significant history of falls and fractures, she may not be a good anticoagulation candidate even if atrial fibrillation was found. - continue aspirin - improve BP control - diabetes and lipid screening per PCP  FALL RISK - use cane or walker at all times  Orders Placed This Encounter  Procedures  . ECHOCARDIOGRAM COMPLETE BUBBLE STUDY   Return for pending if symptoms worsen or fail to  improve.    Penni Bombard, MD 1/43/8887, 5:79 PM Certified in Neurology, Neurophysiology and Neuroimaging  [facility] [address] [place], [place] [address] [phone]

## 2018-04-25 ENCOUNTER — Ambulatory Visit (HOSPITAL_COMMUNITY)
Admission: RE | Admit: 2018-04-25 | Discharge: 2018-04-25 | Disposition: A | Discharge status: Home / Self Care | Payer: PPO | Source: Ambulatory Visit | Attending: Surgery | Admitting: Surgery

## 2018-04-25 DIAGNOSIS — I6930 Unspecified sequelae of cerebral infarction: Secondary | ICD-10-CM | POA: Diagnosis not present

## 2018-04-26 ENCOUNTER — Telehealth: Payer: Self-pay | Admitting: *Deleted

## 2018-04-26 NOTE — Telephone Encounter (Signed)
Called daughter, [name] on [place] and informed her the patient's carotid US was an unremarkable study with no major findings. The patient had a doppler yesterday; I advised she'll get a call when those results are available.  I reviewed Dr Gladstone Lighter plan, recommendations per office note.  [name] verbalized understanding, appreciation.

## 2018-04-27 DIAGNOSIS — Z79899 Other long term (current) drug therapy: Secondary | ICD-10-CM | POA: Diagnosis not present

## 2018-04-27 DIAGNOSIS — M5136 Other intervertebral disc degeneration, lumbar region: Secondary | ICD-10-CM | POA: Diagnosis not present

## 2018-04-27 DIAGNOSIS — M5030 Other cervical disc degeneration, unspecified cervical region: Secondary | ICD-10-CM | POA: Diagnosis not present

## 2018-05-08 ENCOUNTER — Other Ambulatory Visit (HOSPITAL_COMMUNITY): Payer: PPO

## 2018-05-24 DIAGNOSIS — J449 Chronic obstructive pulmonary disease, unspecified: Secondary | ICD-10-CM | POA: Diagnosis not present

## 2018-05-24 DIAGNOSIS — R7303 Prediabetes: Secondary | ICD-10-CM | POA: Diagnosis not present

## 2018-05-24 DIAGNOSIS — M25532 Pain in left wrist: Secondary | ICD-10-CM | POA: Diagnosis not present

## 2018-05-24 DIAGNOSIS — M5136 Other intervertebral disc degeneration, lumbar region: Secondary | ICD-10-CM | POA: Diagnosis not present

## 2018-05-24 DIAGNOSIS — H9313 Tinnitus, bilateral: Secondary | ICD-10-CM | POA: Diagnosis not present

## 2018-05-24 DIAGNOSIS — M5442 Lumbago with sciatica, left side: Secondary | ICD-10-CM | POA: Diagnosis not present

## 2018-05-24 DIAGNOSIS — R809 Proteinuria, unspecified: Secondary | ICD-10-CM | POA: Diagnosis not present

## 2018-05-24 DIAGNOSIS — Z79899 Other long term (current) drug therapy: Secondary | ICD-10-CM | POA: Diagnosis not present

## 2018-05-24 DIAGNOSIS — R030 Elevated blood-pressure reading, without diagnosis of hypertension: Secondary | ICD-10-CM | POA: Diagnosis not present

## 2018-05-24 DIAGNOSIS — Z0001 Encounter for general adult medical examination with abnormal findings: Secondary | ICD-10-CM | POA: Diagnosis not present

## 2018-05-24 DIAGNOSIS — K219 Gastro-esophageal reflux disease without esophagitis: Secondary | ICD-10-CM | POA: Diagnosis not present

## 2018-05-24 DIAGNOSIS — Z125 Encounter for screening for malignant neoplasm of prostate: Secondary | ICD-10-CM | POA: Diagnosis not present

## 2018-05-24 DIAGNOSIS — Z951 Presence of aortocoronary bypass graft: Secondary | ICD-10-CM | POA: Diagnosis not present

## 2018-05-24 DIAGNOSIS — H00014 Hordeolum externum left upper eyelid: Secondary | ICD-10-CM | POA: Diagnosis not present

## 2018-05-24 DIAGNOSIS — Z13220 Encounter for screening for lipoid disorders: Secondary | ICD-10-CM | POA: Diagnosis not present

## 2018-05-24 DIAGNOSIS — R05 Cough: Secondary | ICD-10-CM | POA: Diagnosis not present

## 2018-05-24 DIAGNOSIS — M5030 Other cervical disc degeneration, unspecified cervical region: Secondary | ICD-10-CM | POA: Diagnosis not present

## 2018-05-24 DIAGNOSIS — Z23 Encounter for immunization: Secondary | ICD-10-CM | POA: Diagnosis not present

## 2018-06-06 ENCOUNTER — Ambulatory Visit (HOSPITAL_COMMUNITY): Payer: PPO | Attending: Cardiology

## 2018-06-09 DIAGNOSIS — F321 Major depressive disorder, single episode, moderate: Secondary | ICD-10-CM | POA: Diagnosis not present

## 2018-06-16 DIAGNOSIS — M47812 Spondylosis without myelopathy or radiculopathy, cervical region: Secondary | ICD-10-CM | POA: Diagnosis not present

## 2018-06-16 DIAGNOSIS — M5136 Other intervertebral disc degeneration, lumbar region: Secondary | ICD-10-CM | POA: Diagnosis not present

## 2018-06-16 DIAGNOSIS — M5030 Other cervical disc degeneration, unspecified cervical region: Secondary | ICD-10-CM | POA: Diagnosis not present

## 2018-06-16 DIAGNOSIS — Z79899 Other long term (current) drug therapy: Secondary | ICD-10-CM | POA: Diagnosis not present

## 2018-06-19 DIAGNOSIS — J069 Acute upper respiratory infection, unspecified: Secondary | ICD-10-CM | POA: Diagnosis not present

## 2018-06-19 DIAGNOSIS — F17210 Nicotine dependence, cigarettes, uncomplicated: Secondary | ICD-10-CM | POA: Diagnosis not present

## 2018-06-19 DIAGNOSIS — J40 Bronchitis, not specified as acute or chronic: Secondary | ICD-10-CM | POA: Diagnosis not present

## 2018-07-13 DIAGNOSIS — M2550 Pain in unspecified joint: Secondary | ICD-10-CM | POA: Diagnosis not present

## 2018-07-13 DIAGNOSIS — R531 Weakness: Secondary | ICD-10-CM | POA: Diagnosis not present

## 2018-07-13 DIAGNOSIS — R29898 Other symptoms and signs involving the musculoskeletal system: Secondary | ICD-10-CM | POA: Diagnosis not present

## 2018-07-13 DIAGNOSIS — M1990 Unspecified osteoarthritis, unspecified site: Secondary | ICD-10-CM | POA: Diagnosis not present

## 2018-07-13 DIAGNOSIS — I10 Essential (primary) hypertension: Secondary | ICD-10-CM | POA: Diagnosis not present

## 2018-07-13 DIAGNOSIS — R0989 Other specified symptoms and signs involving the circulatory and respiratory systems: Secondary | ICD-10-CM | POA: Diagnosis not present

## 2018-07-13 DIAGNOSIS — D649 Anemia, unspecified: Secondary | ICD-10-CM | POA: Diagnosis not present

## 2018-07-13 DIAGNOSIS — Z9181 History of falling: Secondary | ICD-10-CM | POA: Diagnosis not present

## 2018-07-13 DIAGNOSIS — R05 Cough: Secondary | ICD-10-CM | POA: Diagnosis not present

## 2018-07-13 DIAGNOSIS — F4321 Adjustment disorder with depressed mood: Secondary | ICD-10-CM | POA: Diagnosis not present

## 2018-07-13 DIAGNOSIS — M4120 Other idiopathic scoliosis, site unspecified: Secondary | ICD-10-CM | POA: Diagnosis not present

## 2018-07-13 DIAGNOSIS — G894 Chronic pain syndrome: Secondary | ICD-10-CM | POA: Diagnosis not present

## 2018-07-13 DIAGNOSIS — F329 Major depressive disorder, single episode, unspecified: Secondary | ICD-10-CM | POA: Diagnosis not present

## 2018-07-17 DIAGNOSIS — Z79899 Other long term (current) drug therapy: Secondary | ICD-10-CM | POA: Diagnosis not present

## 2018-07-17 DIAGNOSIS — M5136 Other intervertebral disc degeneration, lumbar region: Secondary | ICD-10-CM | POA: Diagnosis not present

## 2018-07-17 DIAGNOSIS — M5030 Other cervical disc degeneration, unspecified cervical region: Secondary | ICD-10-CM | POA: Diagnosis not present

## 2018-07-21 DIAGNOSIS — Z7982 Long term (current) use of aspirin: Secondary | ICD-10-CM | POA: Diagnosis not present

## 2018-07-21 DIAGNOSIS — Z9181 History of falling: Secondary | ICD-10-CM | POA: Diagnosis not present

## 2018-07-21 DIAGNOSIS — Z8781 Personal history of (healed) traumatic fracture: Secondary | ICD-10-CM | POA: Diagnosis not present

## 2018-07-21 DIAGNOSIS — G629 Polyneuropathy, unspecified: Secondary | ICD-10-CM | POA: Diagnosis not present

## 2018-07-21 DIAGNOSIS — Z79891 Long term (current) use of opiate analgesic: Secondary | ICD-10-CM | POA: Diagnosis not present

## 2018-07-21 DIAGNOSIS — I83813 Varicose veins of bilateral lower extremities with pain: Secondary | ICD-10-CM | POA: Diagnosis not present

## 2018-07-21 DIAGNOSIS — M797 Fibromyalgia: Secondary | ICD-10-CM | POA: Diagnosis not present

## 2018-07-21 DIAGNOSIS — F329 Major depressive disorder, single episode, unspecified: Secondary | ICD-10-CM | POA: Diagnosis not present

## 2018-07-21 DIAGNOSIS — D631 Anemia in chronic kidney disease: Secondary | ICD-10-CM | POA: Diagnosis not present

## 2018-07-21 DIAGNOSIS — Z96612 Presence of left artificial shoulder joint: Secondary | ICD-10-CM | POA: Diagnosis not present

## 2018-07-21 DIAGNOSIS — N183 Chronic kidney disease, stage 3 (moderate): Secondary | ICD-10-CM | POA: Diagnosis not present

## 2018-07-21 DIAGNOSIS — M4120 Other idiopathic scoliosis, site unspecified: Secondary | ICD-10-CM | POA: Diagnosis not present

## 2018-07-21 DIAGNOSIS — I129 Hypertensive chronic kidney disease with stage 1 through stage 4 chronic kidney disease, or unspecified chronic kidney disease: Secondary | ICD-10-CM | POA: Diagnosis not present

## 2018-07-21 DIAGNOSIS — M961 Postlaminectomy syndrome, not elsewhere classified: Secondary | ICD-10-CM | POA: Diagnosis not present

## 2018-07-21 DIAGNOSIS — F419 Anxiety disorder, unspecified: Secondary | ICD-10-CM | POA: Diagnosis not present

## 2018-07-21 DIAGNOSIS — Z720 Tobacco use: Secondary | ICD-10-CM | POA: Diagnosis not present

## 2018-07-21 DIAGNOSIS — G894 Chronic pain syndrome: Secondary | ICD-10-CM | POA: Diagnosis not present

## 2018-07-21 DIAGNOSIS — R899 Unspecified abnormal finding in specimens from other organs, systems and tissues: Secondary | ICD-10-CM | POA: Diagnosis not present

## 2018-07-21 DIAGNOSIS — G8191 Hemiplegia, unspecified affecting right dominant side: Secondary | ICD-10-CM | POA: Diagnosis not present

## 2018-07-25 DIAGNOSIS — Z720 Tobacco use: Secondary | ICD-10-CM | POA: Diagnosis not present

## 2018-07-25 DIAGNOSIS — F419 Anxiety disorder, unspecified: Secondary | ICD-10-CM | POA: Diagnosis not present

## 2018-07-25 DIAGNOSIS — F329 Major depressive disorder, single episode, unspecified: Secondary | ICD-10-CM | POA: Diagnosis not present

## 2018-07-25 DIAGNOSIS — M797 Fibromyalgia: Secondary | ICD-10-CM | POA: Diagnosis not present

## 2018-07-25 DIAGNOSIS — G8191 Hemiplegia, unspecified affecting right dominant side: Secondary | ICD-10-CM | POA: Diagnosis not present

## 2018-07-25 DIAGNOSIS — M961 Postlaminectomy syndrome, not elsewhere classified: Secondary | ICD-10-CM | POA: Diagnosis not present

## 2018-07-25 DIAGNOSIS — Z8781 Personal history of (healed) traumatic fracture: Secondary | ICD-10-CM | POA: Diagnosis not present

## 2018-07-25 DIAGNOSIS — Z96612 Presence of left artificial shoulder joint: Secondary | ICD-10-CM | POA: Diagnosis not present

## 2018-07-25 DIAGNOSIS — N183 Chronic kidney disease, stage 3 (moderate): Secondary | ICD-10-CM | POA: Diagnosis not present

## 2018-07-25 DIAGNOSIS — D631 Anemia in chronic kidney disease: Secondary | ICD-10-CM | POA: Diagnosis not present

## 2018-07-25 DIAGNOSIS — I129 Hypertensive chronic kidney disease with stage 1 through stage 4 chronic kidney disease, or unspecified chronic kidney disease: Secondary | ICD-10-CM | POA: Diagnosis not present

## 2018-07-25 DIAGNOSIS — G629 Polyneuropathy, unspecified: Secondary | ICD-10-CM | POA: Diagnosis not present

## 2018-07-25 DIAGNOSIS — I83813 Varicose veins of bilateral lower extremities with pain: Secondary | ICD-10-CM | POA: Diagnosis not present

## 2018-07-25 DIAGNOSIS — Z7982 Long term (current) use of aspirin: Secondary | ICD-10-CM | POA: Diagnosis not present

## 2018-07-25 DIAGNOSIS — Z9181 History of falling: Secondary | ICD-10-CM | POA: Diagnosis not present

## 2018-07-25 DIAGNOSIS — G894 Chronic pain syndrome: Secondary | ICD-10-CM | POA: Diagnosis not present

## 2018-07-25 DIAGNOSIS — Z79891 Long term (current) use of opiate analgesic: Secondary | ICD-10-CM | POA: Diagnosis not present

## 2018-07-25 DIAGNOSIS — M4120 Other idiopathic scoliosis, site unspecified: Secondary | ICD-10-CM | POA: Diagnosis not present

## 2018-07-28 DIAGNOSIS — M79622 Pain in left upper arm: Secondary | ICD-10-CM | POA: Diagnosis not present

## 2018-07-28 DIAGNOSIS — S41112A Laceration without foreign body of left upper arm, initial encounter: Secondary | ICD-10-CM | POA: Diagnosis not present

## 2018-07-31 ENCOUNTER — Encounter (HOSPITAL_BASED_OUTPATIENT_CLINIC_OR_DEPARTMENT_OTHER): Payer: Self-pay | Admitting: Emergency Medicine

## 2018-07-31 ENCOUNTER — Emergency Department (HOSPITAL_BASED_OUTPATIENT_CLINIC_OR_DEPARTMENT_OTHER): Payer: PPO

## 2018-07-31 ENCOUNTER — Emergency Department (HOSPITAL_BASED_OUTPATIENT_CLINIC_OR_DEPARTMENT_OTHER)
Admission: EM | Admit: 2018-07-31 | Discharge: 2018-07-31 | Disposition: A | Discharge status: Home / Self Care | Payer: PPO | Attending: Emergency Medicine | Admitting: Emergency Medicine

## 2018-07-31 ENCOUNTER — Other Ambulatory Visit: Payer: Self-pay

## 2018-07-31 DIAGNOSIS — J45909 Unspecified asthma, uncomplicated: Secondary | ICD-10-CM | POA: Insufficient documentation

## 2018-07-31 DIAGNOSIS — S0990XA Unspecified injury of head, initial encounter: Secondary | ICD-10-CM | POA: Diagnosis not present

## 2018-07-31 DIAGNOSIS — Z20828 Contact with and (suspected) exposure to other viral communicable diseases: Secondary | ICD-10-CM | POA: Insufficient documentation

## 2018-07-31 DIAGNOSIS — Y999 Unspecified external cause status: Secondary | ICD-10-CM | POA: Diagnosis not present

## 2018-07-31 DIAGNOSIS — R2681 Unsteadiness on feet: Secondary | ICD-10-CM | POA: Insufficient documentation

## 2018-07-31 DIAGNOSIS — R296 Repeated falls: Secondary | ICD-10-CM | POA: Insufficient documentation

## 2018-07-31 DIAGNOSIS — Z87891 Personal history of nicotine dependence: Secondary | ICD-10-CM | POA: Insufficient documentation

## 2018-07-31 DIAGNOSIS — I10 Essential (primary) hypertension: Secondary | ICD-10-CM | POA: Diagnosis not present

## 2018-07-31 DIAGNOSIS — Z79899 Other long term (current) drug therapy: Secondary | ICD-10-CM | POA: Insufficient documentation

## 2018-07-31 DIAGNOSIS — R82998 Other abnormal findings in urine: Secondary | ICD-10-CM | POA: Diagnosis not present

## 2018-07-31 DIAGNOSIS — Y929 Unspecified place or not applicable: Secondary | ICD-10-CM | POA: Insufficient documentation

## 2018-07-31 DIAGNOSIS — S299XXA Unspecified injury of thorax, initial encounter: Secondary | ICD-10-CM | POA: Diagnosis not present

## 2018-07-31 DIAGNOSIS — M6281 Muscle weakness (generalized): Secondary | ICD-10-CM | POA: Diagnosis present

## 2018-07-31 DIAGNOSIS — W19XXXA Unspecified fall, initial encounter: Secondary | ICD-10-CM | POA: Insufficient documentation

## 2018-07-31 DIAGNOSIS — Z7982 Long term (current) use of aspirin: Secondary | ICD-10-CM | POA: Insufficient documentation

## 2018-07-31 DIAGNOSIS — Y939 Activity, unspecified: Secondary | ICD-10-CM | POA: Diagnosis not present

## 2018-07-31 DIAGNOSIS — R531 Weakness: Secondary | ICD-10-CM | POA: Diagnosis not present

## 2018-07-31 LAB — URINALYSIS, ROUTINE W REFLEX MICROSCOPIC
Bilirubin Urine: NEGATIVE
Glucose, UA: NEGATIVE mg/dL
Ketones, ur: NEGATIVE mg/dL
Nitrite: NEGATIVE
Protein, ur: NEGATIVE mg/dL
Specific Gravity, Urine: 1.02 (ref 1.005–1.030)
pH: 5.5 (ref 5.0–8.0)

## 2018-07-31 LAB — COMPREHENSIVE METABOLIC PANEL
ALT: 12 U/L (ref 0–44)
AST: 22 U/L (ref 15–41)
Albumin: 3.7 g/dL (ref 3.5–5.0)
Alkaline Phosphatase: 72 U/L (ref 38–126)
Anion gap: 8 (ref 5–15)
BUN: 39 mg/dL — ABNORMAL HIGH (ref 8–23)
CO2: 24 mmol/L (ref 22–32)
Calcium: 8.4 mg/dL — ABNORMAL LOW (ref 8.9–10.3)
Chloride: 104 mmol/L (ref 98–111)
Creatinine, Ser: 2.1 mg/dL — ABNORMAL HIGH (ref 0.44–1.00)
GFR calc Af Amer: 25 mL/min — ABNORMAL LOW (ref >60)
GFR calc non Af Amer: 22 mL/min — ABNORMAL LOW (ref >60)
Glucose, Bld: 136 mg/dL — ABNORMAL HIGH (ref 70–99)
Potassium: 4.4 mmol/L (ref 3.5–5.1)
Sodium: 136 mmol/L (ref 135–145)
Total Bilirubin: 0.4 mg/dL (ref 0.3–1.2)
Total Protein: 6.4 g/dL — ABNORMAL LOW (ref 6.5–8.1)

## 2018-07-31 LAB — CBC WITH DIFFERENTIAL/PLATELET
Abs Immature Granulocytes: 0.02 10*3/uL (ref 0.00–0.07)
Basophils Absolute: 0.1 10*3/uL (ref 0.0–0.1)
Basophils Relative: 1 %
Eosinophils Absolute: 0.2 10*3/uL (ref 0.0–0.5)
Eosinophils Relative: 4 %
HCT: 33 % — ABNORMAL LOW (ref 36.0–46.0)
Hemoglobin: 9.7 g/dL — ABNORMAL LOW (ref 12.0–15.0)
Immature Granulocytes: 0 %
Lymphocytes Relative: 14 %
Lymphs Abs: 0.8 10*3/uL (ref 0.7–4.0)
MCH: 28.4 pg (ref 26.0–34.0)
MCHC: 29.4 g/dL — ABNORMAL LOW (ref 30.0–36.0)
MCV: 96.5 fL (ref 80.0–100.0)
Monocytes Absolute: 0.6 10*3/uL (ref 0.1–1.0)
Monocytes Relative: 11 %
Neutro Abs: 3.9 10*3/uL (ref 1.7–7.7)
Neutrophils Relative %: 70 %
Platelets: 419 10*3/uL — ABNORMAL HIGH (ref 150–400)
RBC: 3.42 MIL/uL — ABNORMAL LOW (ref 3.87–5.11)
RDW: 13.9 % (ref 11.5–15.5)
WBC: 5.5 10*3/uL (ref 4.0–10.5)
nRBC: 0 % (ref 0.0–0.2)

## 2018-07-31 LAB — URINALYSIS, MICROSCOPIC (REFLEX)

## 2018-07-31 MED ORDER — FOSFOMYCIN TROMETHAMINE 3 G PO PACK
3.0000 g | PACK | Freq: Once | ORAL | Status: AC
  Administered 2018-07-31: 16:00:00 3 g via ORAL
  Filled 2018-07-31: qty 3

## 2018-07-31 MED ORDER — SODIUM CHLORIDE 0.9 % IV BOLUS
1000.0000 mL | Freq: Once | INTRAVENOUS | Status: AC
  Administered 2018-07-31: 1000 mL via INTRAVENOUS

## 2018-07-31 NOTE — ED Triage Notes (Addendum)
Leg weakness for 3-4 weeks.  Pt has fallen x 3 in the last week.  Last fall today, hit head but no LOC.   Some confusion and increased weakness since Friday.

## 2018-07-31 NOTE — ED Notes (Signed)
Patient transported to CT 

## 2018-07-31 NOTE — ED Provider Notes (Signed)
[facility] EMERGENCY DEPARTMENT Provider Note   CSN: [id number] Arrival date & time: 07/31/18  1346    History   Chief Complaint Chief Complaint  Patient presents with   Fall    HPI Shirley [name] is a 78 y.o. female.     78 yo F with a chief complaints of bilateral leg weakness and frequent falls.  This been going on for the past week.  Patient has a documented history of difficulty with ambulation and also known to have issues with pain management and chronic anxiety.  She states that her falling is gotten worse for the last past week.  Also has been slightly more confused.  Looking at the patient's chart she has had multiple telephone encounters with her PCP recently and had a visit last week.  At that time her chronic Xanax was switched for Vistaril.  The patient states that she had a small laceration to her left forearm that was repaired at urgent care a few days ago and she thinks she bumped her head at some point.  She denies any pain to the extremities.  Eyes abdominal pain or chest pain denies trouble breathing.  She denies chest pain denies cough or fever.  Denies abdominal pain vomiting or diarrhea.  She denies decreased oral intake.  She has been taking fluid pills for her lower extremity edema.  She also feels that she has been slightly off recently.  States that she has had some confusion.  The history is provided by the patient.  Fall  This is a new problem. The current episode started more than 1 week ago. The problem occurs constantly. The problem has been gradually worsening. Pertinent negatives include no chest pain, no abdominal pain, no headaches and no shortness of breath. Nothing aggravates the symptoms. Nothing relieves the symptoms. She has tried nothing for the symptoms. The treatment provided no relief.    Past Medical History:  Diagnosis Date   Allergy    vasomotor rhinitis   Anemia    Asthma    h/o asthma and bronchitis   BV (bacterial  vaginosis)    history of frequent   Cervical spondylosis    herniated disk protruding @ C6-7, impinging cord and left axillary root sleeve.   Chronic pain    Closed olecranon fracture, left, initial encounter    Depression    DVT of upper extremity (deep vein thrombosis) (HCC)    h/o left(after wrist fracture/surgery); no residual thrombus or phlebitis by Dopplers July 2014   Dyspnea    with exertion   Edema    Fibromyalgia    sees Dr.Phillips-pain management   GERD (gastroesophageal reflux disease)    h/o   Headache    marigraine- hormal - none in years   Heart murmur    Hypertension    never has been treated that daughter is aware   Neuropathy    Neuropathy    Osteopenia    mild at hip (T-1.3)   PAT (paroxysmal atrial tachycardia) ([place])    Pneumonia    years ago   Recurrent falls 04/16/2016   Varicose veins    Varicose veins of both legs with edema 08/2012   Also pain; bilateral GSV dilated with reflux, R Short SV dilated with reflux; not amenable to VNUS ablation due to tortuosity and superficial nature of veins -- recommeded referral to Dr. 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   OA (osteoarthritis) of hip 08/18/2016   Pre-operative cardiovascular examination 07/16/2016   Recurrent falls 04/16/2016   Chronic pain    Hypertension    Neuropathy    Osteopenia    Vitamin D deficiency    Olecranon fracture 04/15/2016   Fall at home, initial encounter    Fall    Anemia    Post-operative pain    Fibromyalgia    Uncomplicated asthma    Tobacco abuse    AKI (acute [name] injury) ([place])    Leukocytosis    Other secondary hypertension    Hyperglycemia    Acute blood loss anemia    Thrombocytosis (HCC)    Closed fracture of left olecranon process    Acute renal failure  ([place]) 03/18/2016   Acute encephalopathy 03/18/2016   Elbow fracture, left, closed, initial encounter 03/18/2016   Acute pain of left hip 12/23/2015   UTI (urinary tract infection) 12/23/2015   DNR (do not resuscitate) discussion 12/23/2015   Palliative care by specialist 12/23/2015   Chronic pain syndrome 12/23/2015   Left hip pain 12/23/2015   Effusion of left hip    Lytic bone lesions on xray    Exertional dyspnea 09/07/2012   Varicose veins of both lower extremities with pain 08/27/2012   Lower extremity edema 08/27/2012   Abnormal resting ECG findings - sinus rhythm with PACs, and PAT 78/29/5621   Aortic systolic murmur on examination 08/19/2012   PAT (paroxysmal atrial tachycardia) / MAT 08/19/2012    Past Surgical History:  Procedure Laterality Date   ABDOMINAL HYSTERECTOMY  1999   BSO for endometriosis   APPENDECTOMY     BACK SURGERY  age 61   ruptured disk L3-4    CATARACT EXTRACTION  2000   B/L   COLONOSCOPY     FINGER SURGERY     left finger for tender rupture from fall.   FOOT SURGERY     multiple(at least 4 on right, 5 on left)-left Dr.Kerner([place])11/08,left Dr.Nunley-excision 2nd and 3rd mt heads w/artho and hammertoe surgery 4th and 5th 04/2006. left great toe IP fusion and revision of fusion of 2nd through 5th toes 12/2005. Right foot surgeries  Dr.Bednarz 04/2008 and 04/2009.   KNEE SURGERY Right    right knee replacement   NECK SURGERY  2007   cervical   ORIF ELBOW FRACTURE Left 03/22/2016   Procedure: OPEN REDUCTION INTERNAL FIXATION (ORIF) ELBOW/OLECRANON FRACTURE;  Surgeon: Altamese South Coventry, MD;  Location: [facility];  Service: Orthopedics;  Laterality: Left;   ORIF ELBOW FRACTURE Left 04/15/2016   Procedure: OPEN REDUCTION INTERNAL FIXATION (ORIF) LEFT ELBOW/OLECRANON FRACTURE;  Surgeon: Altamese Mattoon, MD;  Location: [facility];  Service: Orthopedics;  Laterality: Left;   RADIOLOGY WITH ANESTHESIA Left 01/06/2016   Procedure: MRI LEFT HIP  WITH AND WITHOUT;  Surgeon: Medication Radiologist, MD;  Location: [facility];  Service: Radiology;  Laterality: Left;   REFRACTIVE SURGERY  2007   left eye   REVERSE SHOULDER ARTHROPLASTY Left 12/31/2016   Procedure: REVERSE LEFT SHOULDER ARTHROPLASTY;  Surgeon: Netta Cedars, MD;  Location: [facility];  Service: Orthopedics;  Laterality: Left;   SHOULDER SURGERY     x8 (4 on rt side and 4 on left side)   SPINAL FUSION  6/09   Dr.Cohen   STERIOD INJECTION Left 04/15/2016   Procedure: LEFT HIP STEROID INJECTION;  Surgeon: Altamese Smyrna, MD;  Location: [facility];  Service: Orthopedics;  Laterality: Left;   TONSILLECTOMY AND ADENOIDECTOMY     TOTAL HIP ARTHROPLASTY Left 08/18/2016   Procedure: LEFT TOTAL HIP ARTHROPLASTY ANTERIOR APPROACH;  Surgeon: Gaynelle Arabian, MD;  Location: [facility];  Service: Orthopedics;  Laterality: Left;   TRANSTHORACIC ECHOCARDIOGRAM  July 2012   Normal EF 60-65% , mild concentric LVH. Grade 2 diastolic dysfunction with elevated EDP. Massive left atrial dilation. Pulmonary pressures 30-40 mm Hg; aortic sclerosis but no stenosis. Moderate TR   TRANSTHORACIC ECHOCARDIOGRAM  03/2016   EF 65-70%. Normal wall motion. GR 1 DD. Severe LA dilation. Mild to moderate TR with mild to moderately elevated PA pressures (44 mmHg) mild aortic calcification but no stenosis. No source of emboli    WRIST SURGERY Left    2 surgeries left wrist after fracture(complicated by DVT)     OB History   No obstetric history on file.      Home Medications    Prior to Admission medications   Medication Sig Start Date End Date Taking? Authorizing Provider  ALPRAZolam Duanne Moron) 0.5 MG tablet Take 0.5-1 mg by mouth 3 (three) times daily as needed for anxiety or sleep.     [provider]  aspirin EC 81 MG tablet Take 81 mg by mouth daily.    [provider]  buPROPion (WELLBUTRIN XL) 300 MG 24 hr tablet Take 300 mg by mouth daily. 04/04/18   [provider]    Carboxymethylcellulose Sodium (MOISTURIZING LUBRICANT EYE OP) Place 1 drop into both eyes as needed (for dry eyes).     [provider]  Cholecalciferol (VITAMIN D3) 5000 units CAPS Take 5,000 Units by mouth 2 (two) times daily.    [provider]  FLUoxetine (PROZAC) 20 MG capsule Take 20 mg by mouth daily.  04/13/18   [provider]  fluticasone (FLONASE) 50 MCG/ACT nasal spray Place 2 sprays into both nostrils daily as needed for allergies or rhinitis.     [provider]  gabapentin (NEURONTIN) 300 MG capsule Take 300-900 mg by mouth See admin instructions. Take 300 mg by mouth in the morning, take 300 mg by mouth in the afternoon and take 900 mg by mouth at bedtime    [provider]  ibuprofen (ADVIL,MOTRIN) 200 MG tablet Take 800 mg by mouth every 6 (six) hours as needed for headache or moderate pain.    [provider]  metoprolol tartrate (LOPRESSOR) 25 MG tablet Take 1 tablet (25 mg total) by mouth 2 (two) times daily. 03/24/16   Rosita Fire, MD  oxyCODONE (ROXICODONE) 15 MG immediate release tablet TAKE 1 TABLET FIVE TIMES A DAY AS NEEDED 03/30/18   [provider]  torsemide (DEMADEX) 10 MG tablet Take 10 mg by mouth daily as needed (for fluid).  01/13/15   [provider]    Family History Family History  Problem Relation Age of Onset   Stroke Mother    Osteoarthritis Mother        Died at age 28   Heart disease Father    Lung cancer Father    Heart failure Father        Died at age 60   Osteoporosis Sister    Diabetes Brother    Heart attack Brother 74   Diabetes Brother    Diabetes Brother    Heart attack Brother 87   Breast cancer Paternal Aunt    Heart failure Maternal Grandmother  Died at age 101, unsure of her heart disease   Heart failure Paternal Grandfather        Died at age 36, unsure of cardiac disease.    Social History Social History   Tobacco Use    Smoking status: Former Smoker    Last attempt to quit: 02/22/1958    Years since quitting: 60.4   Smokeless tobacco: Never Used   Tobacco comment: doesn't smoke every day  Substance Use Topics   Alcohol use: No    Alcohol/week: 0.0 standard drinks   Drug use: No     Allergies   Patient has no known allergies.   Review of Systems Review of Systems  Constitutional: Negative for chills and fever.  HENT: Negative for congestion and rhinorrhea.   Eyes: Negative for redness and visual disturbance.  Respiratory: Negative for shortness of breath and wheezing.   Cardiovascular: Negative for chest pain and palpitations.  Gastrointestinal: Negative for abdominal pain, nausea and vomiting.  Genitourinary: Negative for dysuria and urgency.  Musculoskeletal: Negative for arthralgias and myalgias.  Skin: Negative for pallor and wound.  Neurological: Positive for weakness (bilateral legs). Negative for dizziness and headaches.     Physical Exam Updated Vital Signs BP 108/70 (BP Location: Right Arm)    Pulse 60    Temp 97.8 F (36.6 C) (Oral)    Resp 16    Ht 5\' 8"  (1.727 m)    Wt 77.1 kg    SpO2 97%    BMI 25.85 kg/m   Physical Exam Vitals signs and nursing note reviewed.  Constitutional:      General: She is not in acute distress.    Appearance: She is well-developed. She is not diaphoretic.  HENT:     Head: Normocephalic and atraumatic.     Comments: No appreciable trauma to the head Eyes:     Pupils: Pupils are equal, round, and reactive to light.  Neck:     Musculoskeletal: Normal range of motion and neck supple.  Cardiovascular:     Rate and Rhythm: Normal rate and regular rhythm.     Heart sounds: No murmur. No friction rub. No gallop.   Pulmonary:     Effort: Pulmonary effort is normal.     Breath sounds: No wheezing or rales.  Abdominal:     General: There is no distension.     Palpations: Abdomen is soft.     Tenderness: There is no abdominal tenderness.    Musculoskeletal:        General: No tenderness.     Comments: Repaired wound to the left volar forearm.  Skin:    General: Skin is warm and dry.  Neurological:     Mental Status: She is alert and oriented to person, place, and time.  Psychiatric:        Behavior: Behavior normal.      ED Treatments / Results  Labs (all labs ordered are listed, but only abnormal results are displayed) Labs Reviewed  CBC WITH DIFFERENTIAL/PLATELET - Abnormal; Notable for the following components:      Result Value   RBC 3.42 (*)    Hemoglobin 9.7 (*)    HCT 33.0 (*)    MCHC 29.4 (*)    Platelets 419 (*)    All other components within normal limits  COMPREHENSIVE METABOLIC PANEL - Abnormal; Notable for the following components:   Glucose, Bld 136 (*)    BUN 39 (*)    Creatinine, Ser 2.10 (*)  Calcium 8.4 (*)    Total Protein 6.4 (*)    GFR calc non Af Amer 22 (*)    GFR calc Af Amer 25 (*)    All other components within normal limits  URINALYSIS, ROUTINE W REFLEX MICROSCOPIC - Abnormal; Notable for the following components:   APPearance CLOUDY (*)    Hgb urine dipstick SMALL (*)    Leukocytes,Ua MODERATE (*)    All other components within normal limits  URINALYSIS, MICROSCOPIC (REFLEX) - Abnormal; Notable for the following components:   Bacteria, UA FEW (*)    All other components within normal limits  URINE CULTURE  NOVEL CORONAVIRUS, NAA (HOSPITAL ORDER, SEND-OUT TO REF LAB)    EKG EKG Interpretation  Date/Time:  Monday July 31 2018 15:03:25 EDT Ventricular Rate:  62 PR Interval:    QRS Duration: 99 QT Interval:  455 QTC Calculation: 463 R Axis:   61 Text Interpretation:  Sinus rhythm Prolonged PR interval No significant change since last tracing Confirmed by Deno Etienne [phone]) on 07/31/2018 3:35:53 PM   Radiology Dg Chest 2 View  Result Date: 07/31/2018 CLINICAL DATA:  Weakness and falls EXAM: CHEST - 2 VIEW COMPARISON:  07/13/2018 FINDINGS: Cardiac shadow is enlarged  but stable. Tortuous aorta is again noted. The lungs are clear without focal infiltrate. No effusion or pneumothorax is noted. Postsurgical changes are noted in both shoulder stable from the prior exam. IMPRESSION: No acute abnormality noted. Electronically Signed   By: Inez Catalina M.D.   On: 07/31/2018 16:09   Ct Head Wo Contrast  Result Date: 07/31/2018 CLINICAL DATA:  Fall with trauma to the right parietal region. EXAM: CT HEAD WITHOUT CONTRAST TECHNIQUE: Contiguous axial images were obtained from the base of the skull through the vertex without intravenous contrast. COMPARISON:  CT 03/18/2016 FINDINGS: Brain: Brainstem and cerebellum are unremarkable. Cerebral hemispheres show widespread chronic small-vessel disease of the white matter and old cortical infarction in the left occipital and parietal regions. No sign of acute infarction, mass lesion, hemorrhage, hydrocephalus or extra-axial collection. Vascular: There is atherosclerotic calcification of the major vessels at the base of the brain. Skull: No skull fracture. Sinuses/Orbits: Paranasal sinuses are clear except for a small amount of fluid dependent in the sphenoid sinus. Orbits are negative. Previous lens implants. Small amount of fluid in the right mastoid tip region with chronic sclerotic change. Other: None IMPRESSION: No acute or traumatic finding. 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 Electronically Signed   By: Nelson Chimes M.D.   On: 07/31/2018 14:52    Procedures Procedures (including critical care time)  Medications Ordered in ED Medications  sodium chloride 0.9 % bolus 1,000 mL ( Intravenous Stopped 07/31/18 1637)  fosfomycin (MONUROL) packet 3 g (3 g Oral Given 07/31/18 1621)     Initial Impression / Assessment and Plan / ED Course  I have reviewed the triage vital signs and the nursing notes.  Pertinent labs & imaging results that were available during my care  of the patient were reviewed by me and considered in my medical decision making (see chart for details).        78 yo F with a chief complaint of generalized weakness.  Going on for the past week.  Has a history of the same.  The patient's renal function went from a creatinine of 1.6-2.1.  CT of the head done here is unremarkable.   Her UA appears contaminated, as she has had symptoms  like this previously when she had a urinary tract infection I will give 1 dose of fosfomycin.  Post IV fluids the patient is able to ambulate with some moderate assistance.  Will discharge home.  We will send an outpatient cover test should the patient get worse.  Have her eat and drink well for the next couple days.  Follow-up with your family doctor.  5:09 PM:  I have discussed the diagnosis/risks/treatment options with the patient and caregiver and believe the pt to be eligible for discharge home to follow-up with PCP. We also discussed returning to the ED immediately if new or worsening sx occur. We discussed the sx which are most concerning (e.g., sudden worsening pain, fever, inability to tolerate by mouth) that necessitate immediate return. Medications administered to the patient during their visit and any new prescriptions provided to the patient are listed below.  Medications given during this visit Medications  sodium chloride 0.9 % bolus 1,000 mL ( Intravenous Stopped 07/31/18 1637)  fosfomycin (MONUROL) packet 3 g (3 g Oral Given 07/31/18 1621)     The patient appears reasonably screen and/or stabilized for discharge and I doubt any other medical condition or other [facility] requiring further screening, evaluation, or treatment in the ED at this time prior to discharge.    Final Clinical Impressions(s) / ED Diagnoses   Final diagnoses:  Unsteady gait    ED Discharge Orders    None       Deno Etienne, DO 07/31/18 1709

## 2018-07-31 NOTE — ED Notes (Signed)
PT ambulated in hallway with walker and standby assit. Pt needed assistance getting out of bed. Unsteady on feet with getting up from sitting position.

## 2018-07-31 NOTE — ED Notes (Signed)
Pt. Had a fall on Friday with L arm injury. Pt. Has noted forearm dressing that was taken off and dried scabbing with 3 stitches in place that are intact.  Pt. Reports they were placed on Friday by urgent care.  Pt. Reports she is here today after a fall in her house on her floor causing her pain on the R side of her head.  Pt also said she had a small amt of pain in the R arm with old scab noted to the R forearm.

## 2018-07-31 NOTE — Discharge Instructions (Signed)
Eat and drink well for the next couple days.  Call your doctor and let them know how you are doing.  Please return if you feel it is unsafe for you at home.

## 2018-08-01 LAB — URINE CULTURE

## 2018-08-01 LAB — NOVEL CORONAVIRUS, NAA (HOSP ORDER, SEND-OUT TO REF LAB; TAT 18-24 HRS): SARS-CoV-2, NAA: NOT DETECTED

## 2018-08-03 ENCOUNTER — Telehealth (HOSPITAL_COMMUNITY): Payer: Self-pay

## 2018-08-04 DIAGNOSIS — R82998 Other abnormal findings in urine: Secondary | ICD-10-CM | POA: Diagnosis not present

## 2018-08-04 DIAGNOSIS — Z87440 Personal history of urinary (tract) infections: Secondary | ICD-10-CM | POA: Diagnosis not present

## 2018-08-04 DIAGNOSIS — R296 Repeated falls: Secondary | ICD-10-CM | POA: Diagnosis not present

## 2018-08-07 ENCOUNTER — Other Ambulatory Visit: Payer: Self-pay

## 2018-08-07 ENCOUNTER — Ambulatory Visit (HOSPITAL_COMMUNITY)
Admission: RE | Admit: 2018-08-07 | Discharge: 2018-08-07 | Disposition: A | Discharge status: Home / Self Care | Payer: PPO | Source: Ambulatory Visit | Attending: Diagnostic Neuroimaging | Admitting: Diagnostic Neuroimaging

## 2018-08-07 DIAGNOSIS — I6930 Unspecified sequelae of cerebral infarction: Secondary | ICD-10-CM | POA: Insufficient documentation

## 2018-08-07 DIAGNOSIS — I083 Combined rheumatic disorders of mitral, aortic and tricuspid valves: Secondary | ICD-10-CM | POA: Diagnosis not present

## 2018-08-07 NOTE — Progress Notes (Signed)
  Echocardiogram 2D Echocardiogram has been performed.  Oneal Deputy Stephon Weathers 08/07/2018, 3:12 PM

## 2018-08-07 NOTE — Progress Notes (Deleted)
This RN called to Echo lab to assist with IV placement, bubble study, and then IV removal. Pt tolerated well.

## 2018-08-07 NOTE — Progress Notes (Signed)
Shirley Bring RN to echo lab to start IV, push bubble study, and remove IV. Pt tolerated well.

## 2018-08-16 DIAGNOSIS — M5136 Other intervertebral disc degeneration, lumbar region: Secondary | ICD-10-CM | POA: Diagnosis not present

## 2018-08-16 DIAGNOSIS — M5030 Other cervical disc degeneration, unspecified cervical region: Secondary | ICD-10-CM | POA: Diagnosis not present

## 2018-08-16 DIAGNOSIS — Z79899 Other long term (current) drug therapy: Secondary | ICD-10-CM | POA: Diagnosis not present

## 2018-08-16 DIAGNOSIS — S41112D Laceration without foreign body of left upper arm, subsequent encounter: Secondary | ICD-10-CM | POA: Diagnosis not present

## 2018-08-16 DIAGNOSIS — R296 Repeated falls: Secondary | ICD-10-CM | POA: Diagnosis not present

## 2018-08-23 DIAGNOSIS — W19XXXA Unspecified fall, initial encounter: Secondary | ICD-10-CM | POA: Diagnosis not present

## 2018-08-23 DIAGNOSIS — I129 Hypertensive chronic kidney disease with stage 1 through stage 4 chronic kidney disease, or unspecified chronic kidney disease: Secondary | ICD-10-CM | POA: Diagnosis not present

## 2018-08-23 DIAGNOSIS — N184 Chronic kidney disease, stage 4 (severe): Secondary | ICD-10-CM | POA: Diagnosis not present

## 2018-08-23 DIAGNOSIS — N390 Urinary tract infection, site not specified: Secondary | ICD-10-CM | POA: Diagnosis not present

## 2018-08-28 DIAGNOSIS — I129 Hypertensive chronic kidney disease with stage 1 through stage 4 chronic kidney disease, or unspecified chronic kidney disease: Secondary | ICD-10-CM | POA: Diagnosis not present

## 2018-08-28 DIAGNOSIS — G894 Chronic pain syndrome: Secondary | ICD-10-CM | POA: Diagnosis not present

## 2018-08-28 DIAGNOSIS — G629 Polyneuropathy, unspecified: Secondary | ICD-10-CM | POA: Diagnosis not present

## 2018-08-28 DIAGNOSIS — D631 Anemia in chronic kidney disease: Secondary | ICD-10-CM | POA: Diagnosis not present

## 2018-08-28 DIAGNOSIS — M961 Postlaminectomy syndrome, not elsewhere classified: Secondary | ICD-10-CM | POA: Diagnosis not present

## 2018-08-28 DIAGNOSIS — Z7951 Long term (current) use of inhaled steroids: Secondary | ICD-10-CM | POA: Diagnosis not present

## 2018-08-28 DIAGNOSIS — Z96612 Presence of left artificial shoulder joint: Secondary | ICD-10-CM | POA: Diagnosis not present

## 2018-08-28 DIAGNOSIS — Z7982 Long term (current) use of aspirin: Secondary | ICD-10-CM | POA: Diagnosis not present

## 2018-08-28 DIAGNOSIS — F329 Major depressive disorder, single episode, unspecified: Secondary | ICD-10-CM | POA: Diagnosis not present

## 2018-08-28 DIAGNOSIS — M797 Fibromyalgia: Secondary | ICD-10-CM | POA: Diagnosis not present

## 2018-08-28 DIAGNOSIS — G8191 Hemiplegia, unspecified affecting right dominant side: Secondary | ICD-10-CM | POA: Diagnosis not present

## 2018-08-28 DIAGNOSIS — Z79891 Long term (current) use of opiate analgesic: Secondary | ICD-10-CM | POA: Diagnosis not present

## 2018-08-28 DIAGNOSIS — N183 Chronic kidney disease, stage 3 (moderate): Secondary | ICD-10-CM | POA: Diagnosis not present

## 2018-08-28 DIAGNOSIS — Z720 Tobacco use: Secondary | ICD-10-CM | POA: Diagnosis not present

## 2018-08-28 DIAGNOSIS — Z8781 Personal history of (healed) traumatic fracture: Secondary | ICD-10-CM | POA: Diagnosis not present

## 2018-08-28 DIAGNOSIS — F419 Anxiety disorder, unspecified: Secondary | ICD-10-CM | POA: Diagnosis not present

## 2018-08-28 DIAGNOSIS — Z9181 History of falling: Secondary | ICD-10-CM | POA: Diagnosis not present

## 2018-08-28 DIAGNOSIS — I83813 Varicose veins of bilateral lower extremities with pain: Secondary | ICD-10-CM | POA: Diagnosis not present

## 2018-08-28 DIAGNOSIS — M4120 Other idiopathic scoliosis, site unspecified: Secondary | ICD-10-CM | POA: Diagnosis not present

## 2018-09-01 DIAGNOSIS — R296 Repeated falls: Secondary | ICD-10-CM | POA: Diagnosis not present

## 2018-09-05 ENCOUNTER — Telehealth: Payer: Self-pay | Admitting: *Deleted

## 2018-09-05 NOTE — Telephone Encounter (Signed)
Called daughter, [name] and informed her that patient's echocardiogram bubble study was a unremarkable study. No major findings related to stroke. Dr Leta Baptist advised she continue current plan. I reviewed Dr Gladstone Lighter recommendations, instructions per office note. [name] verbalized understanding, appreciation.

## 2018-09-13 DIAGNOSIS — M5030 Other cervical disc degeneration, unspecified cervical region: Secondary | ICD-10-CM | POA: Diagnosis not present

## 2018-09-13 DIAGNOSIS — M5136 Other intervertebral disc degeneration, lumbar region: Secondary | ICD-10-CM | POA: Diagnosis not present

## 2018-09-13 DIAGNOSIS — M25512 Pain in left shoulder: Secondary | ICD-10-CM | POA: Diagnosis not present

## 2018-09-13 DIAGNOSIS — Z79899 Other long term (current) drug therapy: Secondary | ICD-10-CM | POA: Diagnosis not present

## 2018-09-26 DIAGNOSIS — G8191 Hemiplegia, unspecified affecting right dominant side: Secondary | ICD-10-CM | POA: Diagnosis not present

## 2018-09-26 DIAGNOSIS — N183 Chronic kidney disease, stage 3 (moderate): Secondary | ICD-10-CM | POA: Diagnosis not present

## 2018-09-26 DIAGNOSIS — M4120 Other idiopathic scoliosis, site unspecified: Secondary | ICD-10-CM | POA: Diagnosis not present

## 2018-09-26 DIAGNOSIS — M961 Postlaminectomy syndrome, not elsewhere classified: Secondary | ICD-10-CM | POA: Diagnosis not present

## 2018-09-26 DIAGNOSIS — G629 Polyneuropathy, unspecified: Secondary | ICD-10-CM | POA: Diagnosis not present

## 2018-09-26 DIAGNOSIS — F329 Major depressive disorder, single episode, unspecified: Secondary | ICD-10-CM | POA: Diagnosis not present

## 2018-09-26 DIAGNOSIS — Z8781 Personal history of (healed) traumatic fracture: Secondary | ICD-10-CM | POA: Diagnosis not present

## 2018-09-26 DIAGNOSIS — D631 Anemia in chronic kidney disease: Secondary | ICD-10-CM | POA: Diagnosis not present

## 2018-09-26 DIAGNOSIS — Z96612 Presence of left artificial shoulder joint: Secondary | ICD-10-CM | POA: Diagnosis not present

## 2018-09-26 DIAGNOSIS — F419 Anxiety disorder, unspecified: Secondary | ICD-10-CM | POA: Diagnosis not present

## 2018-09-26 DIAGNOSIS — Z9181 History of falling: Secondary | ICD-10-CM | POA: Diagnosis not present

## 2018-09-26 DIAGNOSIS — Z7951 Long term (current) use of inhaled steroids: Secondary | ICD-10-CM | POA: Diagnosis not present

## 2018-09-26 DIAGNOSIS — Z79891 Long term (current) use of opiate analgesic: Secondary | ICD-10-CM | POA: Diagnosis not present

## 2018-09-26 DIAGNOSIS — Z7982 Long term (current) use of aspirin: Secondary | ICD-10-CM | POA: Diagnosis not present

## 2018-09-26 DIAGNOSIS — Z720 Tobacco use: Secondary | ICD-10-CM | POA: Diagnosis not present

## 2018-09-26 DIAGNOSIS — G894 Chronic pain syndrome: Secondary | ICD-10-CM | POA: Diagnosis not present

## 2018-09-26 DIAGNOSIS — M797 Fibromyalgia: Secondary | ICD-10-CM | POA: Diagnosis not present

## 2018-09-26 DIAGNOSIS — I83813 Varicose veins of bilateral lower extremities with pain: Secondary | ICD-10-CM | POA: Diagnosis not present

## 2018-09-26 DIAGNOSIS — I129 Hypertensive chronic kidney disease with stage 1 through stage 4 chronic kidney disease, or unspecified chronic kidney disease: Secondary | ICD-10-CM | POA: Diagnosis not present

## 2018-09-27 ENCOUNTER — Encounter (HOSPITAL_COMMUNITY): Payer: Self-pay | Admitting: Emergency Medicine

## 2018-09-27 ENCOUNTER — Observation Stay (HOSPITAL_COMMUNITY)
Admission: EM | Admit: 2018-09-27 | Discharge: 2018-09-30 | Disposition: A | Discharge status: Home / Self Care | Payer: PPO | Attending: Internal Medicine | Admitting: Internal Medicine

## 2018-09-27 ENCOUNTER — Other Ambulatory Visit: Payer: Self-pay

## 2018-09-27 DIAGNOSIS — G629 Polyneuropathy, unspecified: Secondary | ICD-10-CM | POA: Diagnosis not present

## 2018-09-27 DIAGNOSIS — R4182 Altered mental status, unspecified: Secondary | ICD-10-CM | POA: Diagnosis not present

## 2018-09-27 DIAGNOSIS — G9341 Metabolic encephalopathy: Secondary | ICD-10-CM | POA: Diagnosis not present

## 2018-09-27 DIAGNOSIS — F418 Other specified anxiety disorders: Secondary | ICD-10-CM | POA: Diagnosis not present

## 2018-09-27 DIAGNOSIS — Z471 Aftercare following joint replacement surgery: Secondary | ICD-10-CM | POA: Diagnosis not present

## 2018-09-27 DIAGNOSIS — I130 Hypertensive heart and chronic kidney disease with heart failure and stage 1 through stage 4 chronic kidney disease, or unspecified chronic kidney disease: Secondary | ICD-10-CM | POA: Insufficient documentation

## 2018-09-27 DIAGNOSIS — Z791 Long term (current) use of non-steroidal anti-inflammatories (NSAID): Secondary | ICD-10-CM | POA: Insufficient documentation

## 2018-09-27 DIAGNOSIS — W19XXXA Unspecified fall, initial encounter: Secondary | ICD-10-CM | POA: Diagnosis present

## 2018-09-27 DIAGNOSIS — S199XXA Unspecified injury of neck, initial encounter: Secondary | ICD-10-CM | POA: Diagnosis not present

## 2018-09-27 DIAGNOSIS — J45909 Unspecified asthma, uncomplicated: Secondary | ICD-10-CM | POA: Diagnosis present

## 2018-09-27 DIAGNOSIS — Z66 Do not resuscitate: Secondary | ICD-10-CM | POA: Insufficient documentation

## 2018-09-27 DIAGNOSIS — I10 Essential (primary) hypertension: Secondary | ICD-10-CM | POA: Diagnosis present

## 2018-09-27 DIAGNOSIS — N183 Chronic kidney disease, stage 3 unspecified: Secondary | ICD-10-CM | POA: Diagnosis present

## 2018-09-27 DIAGNOSIS — S40022A Contusion of left upper arm, initial encounter: Secondary | ICD-10-CM | POA: Diagnosis not present

## 2018-09-27 DIAGNOSIS — R531 Weakness: Secondary | ICD-10-CM

## 2018-09-27 DIAGNOSIS — N179 Acute kidney failure, unspecified: Secondary | ICD-10-CM | POA: Diagnosis not present

## 2018-09-27 DIAGNOSIS — Z7982 Long term (current) use of aspirin: Secondary | ICD-10-CM | POA: Insufficient documentation

## 2018-09-27 DIAGNOSIS — I471 Supraventricular tachycardia: Secondary | ICD-10-CM | POA: Diagnosis not present

## 2018-09-27 DIAGNOSIS — I639 Cerebral infarction, unspecified: Secondary | ICD-10-CM | POA: Diagnosis present

## 2018-09-27 DIAGNOSIS — M797 Fibromyalgia: Secondary | ICD-10-CM | POA: Diagnosis not present

## 2018-09-27 DIAGNOSIS — Z86718 Personal history of other venous thrombosis and embolism: Secondary | ICD-10-CM | POA: Insufficient documentation

## 2018-09-27 DIAGNOSIS — R627 Adult failure to thrive: Secondary | ICD-10-CM | POA: Diagnosis not present

## 2018-09-27 DIAGNOSIS — N3000 Acute cystitis without hematuria: Secondary | ICD-10-CM

## 2018-09-27 DIAGNOSIS — R296 Repeated falls: Secondary | ICD-10-CM | POA: Insufficient documentation

## 2018-09-27 DIAGNOSIS — M25561 Pain in right knee: Secondary | ICD-10-CM | POA: Insufficient documentation

## 2018-09-27 DIAGNOSIS — Z823 Family history of stroke: Secondary | ICD-10-CM | POA: Insufficient documentation

## 2018-09-27 DIAGNOSIS — I4719 Other supraventricular tachycardia: Secondary | ICD-10-CM | POA: Diagnosis present

## 2018-09-27 DIAGNOSIS — M79622 Pain in left upper arm: Secondary | ICD-10-CM | POA: Diagnosis not present

## 2018-09-27 DIAGNOSIS — Z8673 Personal history of transient ischemic attack (TIA), and cerebral infarction without residual deficits: Secondary | ICD-10-CM | POA: Insufficient documentation

## 2018-09-27 DIAGNOSIS — I5032 Chronic diastolic (congestive) heart failure: Secondary | ICD-10-CM | POA: Diagnosis present

## 2018-09-27 DIAGNOSIS — R4689 Other symptoms and signs involving appearance and behavior: Secondary | ICD-10-CM | POA: Diagnosis not present

## 2018-09-27 DIAGNOSIS — Z8249 Family history of ischemic heart disease and other diseases of the circulatory system: Secondary | ICD-10-CM | POA: Insufficient documentation

## 2018-09-27 DIAGNOSIS — E860 Dehydration: Secondary | ICD-10-CM | POA: Diagnosis present

## 2018-09-27 DIAGNOSIS — Z96612 Presence of left artificial shoulder joint: Secondary | ICD-10-CM | POA: Insufficient documentation

## 2018-09-27 DIAGNOSIS — Z20828 Contact with and (suspected) exposure to other viral communicable diseases: Secondary | ICD-10-CM | POA: Diagnosis not present

## 2018-09-27 DIAGNOSIS — K219 Gastro-esophageal reflux disease without esophagitis: Secondary | ICD-10-CM | POA: Insufficient documentation

## 2018-09-27 DIAGNOSIS — Z96651 Presence of right artificial knee joint: Secondary | ICD-10-CM | POA: Diagnosis not present

## 2018-09-27 DIAGNOSIS — R410 Disorientation, unspecified: Secondary | ICD-10-CM | POA: Diagnosis not present

## 2018-09-27 DIAGNOSIS — M25571 Pain in right ankle and joints of right foot: Secondary | ICD-10-CM | POA: Diagnosis not present

## 2018-09-27 DIAGNOSIS — F4321 Adjustment disorder with depressed mood: Secondary | ICD-10-CM | POA: Diagnosis not present

## 2018-09-27 DIAGNOSIS — S0990XA Unspecified injury of head, initial encounter: Secondary | ICD-10-CM | POA: Diagnosis not present

## 2018-09-27 DIAGNOSIS — R4189 Other symptoms and signs involving cognitive functions and awareness: Secondary | ICD-10-CM | POA: Diagnosis not present

## 2018-09-27 DIAGNOSIS — G894 Chronic pain syndrome: Secondary | ICD-10-CM | POA: Diagnosis not present

## 2018-09-27 DIAGNOSIS — Z87891 Personal history of nicotine dependence: Secondary | ICD-10-CM | POA: Insufficient documentation

## 2018-09-27 DIAGNOSIS — M4320 Fusion of spine, site unspecified: Secondary | ICD-10-CM | POA: Insufficient documentation

## 2018-09-27 DIAGNOSIS — Z96642 Presence of left artificial hip joint: Secondary | ICD-10-CM | POA: Insufficient documentation

## 2018-09-27 DIAGNOSIS — Z9189 Other specified personal risk factors, not elsewhere classified: Secondary | ICD-10-CM | POA: Diagnosis not present

## 2018-09-27 DIAGNOSIS — Z79899 Other long term (current) drug therapy: Secondary | ICD-10-CM | POA: Insufficient documentation

## 2018-09-27 LAB — CBC WITH DIFFERENTIAL/PLATELET
Abs Immature Granulocytes: 0.03 10*3/uL (ref 0.00–0.07)
Basophils Absolute: 0.1 10*3/uL (ref 0.0–0.1)
Basophils Relative: 1 %
Eosinophils Absolute: 0.3 10*3/uL (ref 0.0–0.5)
Eosinophils Relative: 4 %
HCT: 37.7 % (ref 36.0–46.0)
Hemoglobin: 11.5 g/dL — ABNORMAL LOW (ref 12.0–15.0)
Immature Granulocytes: 0 %
Lymphocytes Relative: 21 %
Lymphs Abs: 1.4 10*3/uL (ref 0.7–4.0)
MCH: 28.6 pg (ref 26.0–34.0)
MCHC: 30.5 g/dL (ref 30.0–36.0)
MCV: 93.8 fL (ref 80.0–100.0)
Monocytes Absolute: 0.6 10*3/uL (ref 0.1–1.0)
Monocytes Relative: 9 %
Neutro Abs: 4.5 10*3/uL (ref 1.7–7.7)
Neutrophils Relative %: 65 %
Platelets: 893 10*3/uL — ABNORMAL HIGH (ref 150–400)
RBC: 4.02 MIL/uL (ref 3.87–5.11)
RDW: 13.8 % (ref 11.5–15.5)
WBC: 6.9 10*3/uL (ref 4.0–10.5)
nRBC: 0 % (ref 0.0–0.2)

## 2018-09-27 LAB — COMPREHENSIVE METABOLIC PANEL
ALT: 15 U/L (ref 0–44)
AST: 21 U/L (ref 15–41)
Albumin: 4 g/dL (ref 3.5–5.0)
Alkaline Phosphatase: 117 U/L (ref 38–126)
Anion gap: 14 (ref 5–15)
BUN: 59 mg/dL — ABNORMAL HIGH (ref 8–23)
CO2: 24 mmol/L (ref 22–32)
Calcium: 9.3 mg/dL (ref 8.9–10.3)
Chloride: 98 mmol/L (ref 98–111)
Creatinine, Ser: 2.3 mg/dL — ABNORMAL HIGH (ref 0.44–1.00)
GFR calc Af Amer: 23 mL/min — ABNORMAL LOW (ref >60)
GFR calc non Af Amer: 19 mL/min — ABNORMAL LOW (ref >60)
Glucose, Bld: 137 mg/dL — ABNORMAL HIGH (ref 70–99)
Potassium: 4.6 mmol/L (ref 3.5–5.1)
Sodium: 136 mmol/L (ref 135–145)
Total Bilirubin: 0.6 mg/dL (ref 0.3–1.2)
Total Protein: 7.3 g/dL (ref 6.5–8.1)

## 2018-09-27 NOTE — ED Notes (Signed)
Daughter: Augustine Brannick, [place]

## 2018-09-27 NOTE — ED Triage Notes (Signed)
Patient arrives from home with right lower leg pain, generalized weakness and failure to thrive per family.  Patient's family states she has been falling more lately.  Patient is CAOx4.

## 2018-09-28 ENCOUNTER — Emergency Department (HOSPITAL_COMMUNITY): Payer: PPO

## 2018-09-28 ENCOUNTER — Observation Stay (HOSPITAL_COMMUNITY): Payer: PPO

## 2018-09-28 ENCOUNTER — Other Ambulatory Visit: Payer: Self-pay

## 2018-09-28 ENCOUNTER — Encounter (HOSPITAL_COMMUNITY): Payer: Self-pay | Admitting: *Deleted

## 2018-09-28 DIAGNOSIS — W19XXXA Unspecified fall, initial encounter: Secondary | ICD-10-CM

## 2018-09-28 DIAGNOSIS — I10 Essential (primary) hypertension: Secondary | ICD-10-CM | POA: Diagnosis not present

## 2018-09-28 DIAGNOSIS — S0990XA Unspecified injury of head, initial encounter: Secondary | ICD-10-CM | POA: Diagnosis not present

## 2018-09-28 DIAGNOSIS — M79622 Pain in left upper arm: Secondary | ICD-10-CM | POA: Diagnosis not present

## 2018-09-28 DIAGNOSIS — J4520 Mild intermittent asthma, uncomplicated: Secondary | ICD-10-CM | POA: Diagnosis not present

## 2018-09-28 DIAGNOSIS — R531 Weakness: Secondary | ICD-10-CM

## 2018-09-28 DIAGNOSIS — F418 Other specified anxiety disorders: Secondary | ICD-10-CM | POA: Diagnosis not present

## 2018-09-28 DIAGNOSIS — W19XXXD Unspecified fall, subsequent encounter: Secondary | ICD-10-CM

## 2018-09-28 DIAGNOSIS — S199XXA Unspecified injury of neck, initial encounter: Secondary | ICD-10-CM | POA: Diagnosis not present

## 2018-09-28 DIAGNOSIS — N179 Acute kidney failure, unspecified: Secondary | ICD-10-CM

## 2018-09-28 DIAGNOSIS — N183 Chronic kidney disease, stage 3 unspecified: Secondary | ICD-10-CM | POA: Diagnosis present

## 2018-09-28 DIAGNOSIS — I5032 Chronic diastolic (congestive) heart failure: Secondary | ICD-10-CM | POA: Diagnosis present

## 2018-09-28 DIAGNOSIS — R296 Repeated falls: Secondary | ICD-10-CM | POA: Insufficient documentation

## 2018-09-28 DIAGNOSIS — I639 Cerebral infarction, unspecified: Secondary | ICD-10-CM | POA: Diagnosis present

## 2018-09-28 DIAGNOSIS — Z96651 Presence of right artificial knee joint: Secondary | ICD-10-CM | POA: Diagnosis not present

## 2018-09-28 DIAGNOSIS — R4182 Altered mental status, unspecified: Secondary | ICD-10-CM | POA: Diagnosis not present

## 2018-09-28 DIAGNOSIS — Z471 Aftercare following joint replacement surgery: Secondary | ICD-10-CM | POA: Diagnosis not present

## 2018-09-28 DIAGNOSIS — J45909 Unspecified asthma, uncomplicated: Secondary | ICD-10-CM | POA: Diagnosis present

## 2018-09-28 DIAGNOSIS — M25571 Pain in right ankle and joints of right foot: Secondary | ICD-10-CM | POA: Diagnosis not present

## 2018-09-28 DIAGNOSIS — G9341 Metabolic encephalopathy: Secondary | ICD-10-CM

## 2018-09-28 LAB — BRAIN NATRIURETIC PEPTIDE: B Natriuretic Peptide: 249.8 pg/mL — ABNORMAL HIGH (ref 0.0–100.0)

## 2018-09-28 LAB — BASIC METABOLIC PANEL
Anion gap: 11 (ref 5–15)
BUN: 49 mg/dL — ABNORMAL HIGH (ref 8–23)
CO2: 25 mmol/L (ref 22–32)
Calcium: 8.8 mg/dL — ABNORMAL LOW (ref 8.9–10.3)
Chloride: 104 mmol/L (ref 98–111)
Creatinine, Ser: 1.98 mg/dL — ABNORMAL HIGH (ref 0.44–1.00)
GFR calc Af Amer: 27 mL/min — ABNORMAL LOW (ref >60)
GFR calc non Af Amer: 23 mL/min — ABNORMAL LOW (ref >60)
Glucose, Bld: 109 mg/dL — ABNORMAL HIGH (ref 70–99)
Potassium: 4.5 mmol/L (ref 3.5–5.1)
Sodium: 140 mmol/L (ref 135–145)

## 2018-09-28 LAB — URINALYSIS, ROUTINE W REFLEX MICROSCOPIC
Bilirubin Urine: NEGATIVE
Glucose, UA: NEGATIVE mg/dL
Hgb urine dipstick: NEGATIVE
Ketones, ur: NEGATIVE mg/dL
Nitrite: NEGATIVE
Protein, ur: NEGATIVE mg/dL
Specific Gravity, Urine: 1.013 (ref 1.005–1.030)
pH: 5 (ref 5.0–8.0)

## 2018-09-28 LAB — CBC
HCT: 33.7 % — ABNORMAL LOW (ref 36.0–46.0)
Hemoglobin: 10.1 g/dL — ABNORMAL LOW (ref 12.0–15.0)
MCH: 28.2 pg (ref 26.0–34.0)
MCHC: 30 g/dL (ref 30.0–36.0)
MCV: 94.1 fL (ref 80.0–100.0)
Platelets: 682 10*3/uL — ABNORMAL HIGH (ref 150–400)
RBC: 3.58 MIL/uL — ABNORMAL LOW (ref 3.87–5.11)
RDW: 13.8 % (ref 11.5–15.5)
WBC: 5.5 10*3/uL (ref 4.0–10.5)
nRBC: 0 % (ref 0.0–0.2)

## 2018-09-28 LAB — TSH: TSH: 2.125 u[IU]/mL (ref 0.350–4.500)

## 2018-09-28 LAB — SARS CORONAVIRUS 2 BY RT PCR (HOSPITAL ORDER, PERFORMED IN ~~LOC~~ HOSPITAL LAB): SARS Coronavirus 2: NEGATIVE

## 2018-09-28 MED ORDER — ACETAMINOPHEN 325 MG PO TABS
650.0000 mg | ORAL_TABLET | Freq: Four times a day (QID) | ORAL | Status: DC | PRN
  Administered 2018-09-29 – 2018-09-30 (×2): 650 mg via ORAL
  Filled 2018-09-28 (×2): qty 2

## 2018-09-28 MED ORDER — DM-GUAIFENESIN ER 30-600 MG PO TB12: 1.0000 | ORAL_TABLET | Freq: Two times a day (BID) | ORAL | Status: DC | PRN

## 2018-09-28 MED ORDER — GABAPENTIN 600 MG PO TABS
900.0000 mg | ORAL_TABLET | Freq: Every day | ORAL | Status: DC
  Administered 2018-09-28 – 2018-09-29 (×2): 900 mg via ORAL
  Filled 2018-09-28 (×2): qty 2

## 2018-09-28 MED ORDER — SODIUM CHLORIDE 0.9 % IV SOLN
INTRAVENOUS | Status: DC
  Administered 2018-09-28: 23:00:00 via INTRAVENOUS

## 2018-09-28 MED ORDER — SODIUM CHLORIDE 0.9 % IV SOLN
1.0000 g | Freq: Once | INTRAVENOUS | Status: AC
  Administered 2018-09-28: 04:00:00 1 g via INTRAVENOUS
  Filled 2018-09-28: qty 10

## 2018-09-28 MED ORDER — ALBUTEROL SULFATE (2.5 MG/3ML) 0.083% IN NEBU: 3.0000 mL | INHALATION_SOLUTION | RESPIRATORY_TRACT | Status: DC | PRN

## 2018-09-28 MED ORDER — OXYCODONE HCL 5 MG PO TABS
5.0000 mg | ORAL_TABLET | Freq: Four times a day (QID) | ORAL | Status: DC | PRN
  Administered 2018-09-28 (×2): 5 mg via ORAL
  Filled 2018-09-28 (×2): qty 1

## 2018-09-28 MED ORDER — ACETAMINOPHEN 500 MG PO TABS
1000.0000 mg | ORAL_TABLET | Freq: Once | ORAL | Status: AC
  Administered 2018-09-28: 1000 mg via ORAL
  Filled 2018-09-28: qty 2

## 2018-09-28 MED ORDER — POLYVINYL ALCOHOL 1.4 % OP SOLN
1.0000 [drp] | OPHTHALMIC | Status: DC | PRN
  Filled 2018-09-28: qty 15

## 2018-09-28 MED ORDER — ONDANSETRON HCL 4 MG PO TABS: 4.0000 mg | ORAL_TABLET | Freq: Four times a day (QID) | ORAL | Status: DC | PRN

## 2018-09-28 MED ORDER — VITAMIN D 25 MCG (1000 UNIT) PO TABS
5000.0000 [IU] | ORAL_TABLET | Freq: Every day | ORAL | Status: DC
  Administered 2018-09-28 – 2018-09-30 (×3): 5000 [IU] via ORAL
  Filled 2018-09-28 (×3): qty 5

## 2018-09-28 MED ORDER — GABAPENTIN 300 MG PO CAPS: 300.0000 mg | ORAL_CAPSULE | ORAL | Status: DC

## 2018-09-28 MED ORDER — IPRATROPIUM BROMIDE 0.02 % IN SOLN
0.5000 mg | RESPIRATORY_TRACT | Status: DC
  Administered 2018-09-28: 10:00:00 0.5 mg via RESPIRATORY_TRACT
  Filled 2018-09-28: qty 2.5

## 2018-09-28 MED ORDER — METOPROLOL TARTRATE 25 MG PO TABS
25.0000 mg | ORAL_TABLET | Freq: Two times a day (BID) | ORAL | Status: DC
  Administered 2018-09-28 – 2018-09-30 (×5): 25 mg via ORAL
  Filled 2018-09-28 (×5): qty 1

## 2018-09-28 MED ORDER — SODIUM CHLORIDE 0.9 % IV SOLN
1.0000 g | INTRAVENOUS | Status: DC
  Administered 2018-09-29: 06:00:00 1 g via INTRAVENOUS
  Filled 2018-09-28: qty 10

## 2018-09-28 MED ORDER — BUPROPION HCL ER (XL) 150 MG PO TB24
300.0000 mg | ORAL_TABLET | Freq: Every day | ORAL | Status: DC
  Administered 2018-09-28 – 2018-09-30 (×3): 300 mg via ORAL
  Filled 2018-09-28 (×3): qty 2

## 2018-09-28 MED ORDER — ONDANSETRON HCL 4 MG/2ML IJ SOLN: 4.0000 mg | Freq: Three times a day (TID) | INTRAMUSCULAR | Status: DC | PRN

## 2018-09-28 MED ORDER — ONDANSETRON HCL 4 MG/2ML IJ SOLN: 4.0000 mg | Freq: Four times a day (QID) | INTRAMUSCULAR | Status: DC | PRN

## 2018-09-28 MED ORDER — ACETAMINOPHEN 650 MG RE SUPP: 650.0000 mg | Freq: Four times a day (QID) | RECTAL | Status: DC | PRN

## 2018-09-28 MED ORDER — CARBOXYMETHYLCELLULOSE SODIUM 0.25 % OP SOLN: OPHTHALMIC | Status: DC | PRN

## 2018-09-28 MED ORDER — SODIUM CHLORIDE 0.9 % IV BOLUS (SEPSIS)
500.0000 mL | Freq: Once | INTRAVENOUS | Status: AC
  Administered 2018-09-28: 02:00:00 500 mL via INTRAVENOUS

## 2018-09-28 MED ORDER — ALBUTEROL SULFATE (2.5 MG/3ML) 0.083% IN NEBU: 2.5000 mg | INHALATION_SOLUTION | RESPIRATORY_TRACT | Status: DC | PRN

## 2018-09-28 MED ORDER — HEPARIN SODIUM (PORCINE) 5000 UNIT/ML IJ SOLN
5000.0000 [IU] | Freq: Three times a day (TID) | INTRAMUSCULAR | Status: DC
  Administered 2018-09-28 – 2018-09-30 (×7): 5000 [IU] via SUBCUTANEOUS
  Filled 2018-09-28 (×7): qty 1

## 2018-09-28 MED ORDER — ASPIRIN EC 81 MG PO TBEC
81.0000 mg | DELAYED_RELEASE_TABLET | Freq: Every day | ORAL | Status: DC
  Administered 2018-09-28 – 2018-09-30 (×3): 81 mg via ORAL
  Filled 2018-09-28 (×3): qty 1

## 2018-09-28 MED ORDER — ALPRAZOLAM 0.25 MG PO TABS
0.5000 mg | ORAL_TABLET | Freq: Three times a day (TID) | ORAL | Status: DC | PRN
  Administered 2018-09-29 – 2018-09-30 (×3): 1 mg via ORAL
  Filled 2018-09-28 (×3): qty 4

## 2018-09-28 MED ORDER — FLUOXETINE HCL 20 MG PO CAPS
20.0000 mg | ORAL_CAPSULE | Freq: Every day | ORAL | Status: DC
  Administered 2018-09-28 – 2018-09-30 (×3): 20 mg via ORAL
  Filled 2018-09-28 (×4): qty 1

## 2018-09-28 MED ORDER — GABAPENTIN 600 MG PO TABS
300.0000 mg | ORAL_TABLET | Freq: Two times a day (BID) | ORAL | Status: DC
  Administered 2018-09-28 – 2018-09-30 (×4): 300 mg via ORAL
  Filled 2018-09-28 (×4): qty 1

## 2018-09-28 MED ORDER — ROSUVASTATIN CALCIUM 5 MG PO TABS
5.0000 mg | ORAL_TABLET | Freq: Every day | ORAL | Status: DC
  Administered 2018-09-28 – 2018-09-30 (×3): 5 mg via ORAL
  Filled 2018-09-28 (×3): qty 1

## 2018-09-28 MED ORDER — HYDRALAZINE HCL 20 MG/ML IJ SOLN
5.0000 mg | INTRAMUSCULAR | Status: DC | PRN
  Administered 2018-09-30: 5 mg via INTRAVENOUS
  Filled 2018-09-28 (×3): qty 1

## 2018-09-28 MED ORDER — VITAMIN D3 125 MCG (5000 UT) PO CAPS: 5000.0000 [IU] | ORAL_CAPSULE | Freq: Every day | ORAL | Status: DC

## 2018-09-28 MED ORDER — FLUTICASONE PROPIONATE 50 MCG/ACT NA SUSP
2.0000 | Freq: Every day | NASAL | Status: DC | PRN
  Filled 2018-09-28: qty 16

## 2018-09-28 NOTE — H&P (Addendum)
History and Physical    Shirley [name]:[id number] DOB: 1941/02/14 DOA: 09/27/2018  Referring MD/NP/PA:   PCP: Ardith Dark, PA-C   Patient coming from:  The patient is coming from home.  At baseline, pt is independent for most of ADL.        Chief Complaint: AMS and frequent fall  HPI: Shirley [name] is a 78 y.o. 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  Per her daughter (I called her daughter by phone), patient has been intermittently confused since Saturday.  She has generalized weakness and failure to thrive.  She has been having frequent fall, at least 6-7 times.  She has head injury and bruising to her left arm.  She has pain in right knee. On examination, patient seems to have right-sided weakness.  No facial droop or slurred speech.  Per her daughter, patient does not have chest pain, shortness of breath or cough.  She has mild wheezing sometimes.  No nausea, vomiting, diarrhea, abdominal pain.  No symptoms of UTI.  ED Course: pt was found to have WBC 6.9, negative COVID-19 test, urinalysis (clear appearance, small amount of leukocyte, rare bacteria, WBC 6-10), slightly worsening renal function, temperature normal, blood pressure 121/66, heart rate 65, oxygen saturation 92-100% on room air.  Chest x-ray negative.  CT head is negative for acute intracranial abnormalities, but showed old left occipital and parietal infarct.  CT of C-spine is negative for bony fracture.  X-ray of her right ankle, left humerus, right knee, pelvis and right hip are negative for bony fracture.  Patient is placed on telemetry bed for observation.   Review of Systems: Could not be reviewed due to altered mental status.  Allergy: No Known Allergies  Past Medical History:  Diagnosis Date   Allergy     vasomotor rhinitis   Anemia    Asthma    h/o asthma and bronchitis   BV (bacterial vaginosis)    history of frequent   Cervical spondylosis    herniated disk protruding @ C6-7, impinging cord and left axillary root sleeve.   Chronic pain    Closed olecranon fracture, left, initial encounter    Depression    DVT of upper extremity (deep vein thrombosis) (HCC)    h/o left(after wrist fracture/surgery); no residual thrombus or phlebitis by Dopplers July 2014   Dyspnea    with exertion   Edema    Fibromyalgia    sees Dr.Phillips-pain management   GERD (gastroesophageal reflux disease)    h/o   Headache    marigraine- hormal - none in years   Heart murmur    Hypertension    never has been treated that daughter is aware   Neuropathy    Neuropathy    Osteopenia    mild at hip (T-1.3)   PAT (paroxysmal atrial tachycardia) ([place])    Pneumonia    years ago   Recurrent falls 04/16/2016   Varicose veins    Varicose veins of both legs with edema 08/2012   Also pain; bilateral GSV dilated with reflux, R Short SV dilated with reflux; not amenable to VNUS ablation due to tortuosity and superficial nature of veins -- recommeded referral to Dr. Eilleen Kempf @ [facility]   Vision problem    wears glasses   Vitamin D deficiency  Past Surgical History:  Procedure Laterality Date   ABDOMINAL HYSTERECTOMY  1999   BSO for endometriosis   APPENDECTOMY     BACK SURGERY  age 39   ruptured disk L3-4    CATARACT EXTRACTION  2000   B/L   COLONOSCOPY     FINGER SURGERY     left finger for tender rupture from fall.   FOOT SURGERY     multiple(at least 4 on right, 5 on left)-left Dr.Kerner([place])11/08,left Dr.Nunley-excision 2nd and 3rd mt heads w/artho and hammertoe surgery 4th and 5th 04/2006. left great toe IP fusion and revision of fusion of 2nd through 5th toes 12/2005. Right foot surgeries  Dr.Bednarz 04/2008 and 04/2009.   KNEE SURGERY Right     right knee replacement   NECK SURGERY  2007   cervical   ORIF ELBOW FRACTURE Left 03/22/2016   Procedure: OPEN REDUCTION INTERNAL FIXATION (ORIF) ELBOW/OLECRANON FRACTURE;  Surgeon: Altamese [place]lawn Park, MD;  Location: [facility];  Service: Orthopedics;  Laterality: Left;   ORIF ELBOW FRACTURE Left 04/15/2016   Procedure: OPEN REDUCTION INTERNAL FIXATION (ORIF) LEFT ELBOW/OLECRANON FRACTURE;  Surgeon: Altamese Crane, MD;  Location: [facility];  Service: Orthopedics;  Laterality: Left;   RADIOLOGY WITH ANESTHESIA Left 01/06/2016   Procedure: MRI LEFT HIP WITH AND WITHOUT;  Surgeon: Medication Radiologist, MD;  Location: [facility];  Service: Radiology;  Laterality: Left;   REFRACTIVE SURGERY  2007   left eye   REVERSE SHOULDER ARTHROPLASTY Left 12/31/2016   Procedure: REVERSE LEFT SHOULDER ARTHROPLASTY;  Surgeon: Netta Cedars, MD;  Location: [facility] [facility];  Service: Orthopedics;  Laterality: Left;   SHOULDER SURGERY     x8 (4 on rt side and 4 on left side)   SPINAL FUSION  6/09   Dr.Cohen   STERIOD INJECTION Left 04/15/2016   Procedure: LEFT HIP STEROID INJECTION;  Surgeon: Altamese Mesa, MD;  Location: [facility];  Service: Orthopedics;  Laterality: Left;   TONSILLECTOMY AND ADENOIDECTOMY     TOTAL HIP ARTHROPLASTY Left 08/18/2016   Procedure: LEFT TOTAL HIP ARTHROPLASTY ANTERIOR APPROACH;  Surgeon: Gaynelle Arabian, MD;  Location: [facility];  Service: Orthopedics;  Laterality: Left;   TRANSTHORACIC ECHOCARDIOGRAM  July 2012   Normal EF 60-65% , mild concentric LVH. Grade 2 diastolic dysfunction with elevated EDP. Massive left atrial dilation. Pulmonary pressures 30-40 mm Hg; aortic sclerosis but no stenosis. Moderate TR   TRANSTHORACIC ECHOCARDIOGRAM  03/2016   EF 65-70%. Normal wall motion. GR 1 DD. Severe LA dilation. Mild to moderate TR with mild to moderately elevated PA pressures (44 mmHg) mild aortic calcification but no stenosis. No source of emboli    WRIST SURGERY Left    2 surgeries left wrist after  fracture(complicated by DVT)    Social History:  reports that she quit smoking about 60 years ago. She has never used smokeless tobacco. She reports that she does not drink alcohol or use drugs.  Family History:  Family History  Problem Relation Age of Onset   Stroke Mother    Osteoarthritis Mother        Died at age 84   Heart disease Father    Lung cancer Father    Heart failure Father        Died at age 74   Osteoporosis Sister    Diabetes Brother    Heart attack Brother 17   Diabetes Brother    Diabetes Brother    Heart attack Brother 14   Breast cancer Paternal Aunt  Heart failure Maternal Grandmother        Died at age 27, unsure of her heart disease   Heart failure Paternal Grandfather        Died at age 1, unsure of cardiac disease.     Prior to Admission medications   Medication Sig Start Date End Date Taking? Authorizing Provider  ALPRAZolam Duanne Moron) 0.5 MG tablet Take 0.5-1 mg by mouth 3 (three) times daily as needed for anxiety or sleep.     [provider]  aspirin EC 81 MG tablet Take 81 mg by mouth daily.    [provider]  buPROPion (WELLBUTRIN XL) 300 MG 24 hr tablet Take 300 mg by mouth daily. 04/04/18   [provider]  Carboxymethylcellulose Sodium (MOISTURIZING LUBRICANT EYE OP) Place 1 drop into both eyes as needed (for dry eyes).     [provider]  Cholecalciferol (VITAMIN D3) 5000 units CAPS Take 5,000 Units by mouth 2 (two) times daily.    [provider]  FLUoxetine (PROZAC) 20 MG capsule Take 20 mg by mouth daily.  04/13/18   [provider]  fluticasone (FLONASE) 50 MCG/ACT nasal spray Place 2 sprays into both nostrils daily as needed for allergies or rhinitis.     [provider]  gabapentin (NEURONTIN) 300 MG capsule Take 300-900 mg by mouth See admin instructions. Take 300 mg by mouth in the morning, take 300 mg by mouth in the afternoon and take 900 mg by mouth at  bedtime    [provider]  ibuprofen (ADVIL,MOTRIN) 200 MG tablet Take 800 mg by mouth every 6 (six) hours as needed for headache or moderate pain.    [provider]  metoprolol tartrate (LOPRESSOR) 25 MG tablet Take 1 tablet (25 mg total) by mouth 2 (two) times daily. 03/24/16   Rosita Fire, MD  oxyCODONE (ROXICODONE) 15 MG immediate release tablet TAKE 1 TABLET FIVE TIMES A DAY AS NEEDED 03/30/18   [provider]  torsemide (DEMADEX) 10 MG tablet Take 10 mg by mouth daily as needed (for fluid).  01/13/15   [provider]    Physical Exam: Vitals:   09/28/18 0015 09/28/18 0200 09/28/18 0330 09/28/18 0415  BP: 121/69 121/66 (!) 93/42 (!) 98/53  Pulse: 67 65 65 65  Resp: (!) 23 (!) 21 18 14   Temp:      TempSrc:      SpO2: 95% 92% 94% 95%   General: Not in acute distress HEENT:       Eyes: PERRL, EOMI, no scleral icterus.       ENT: No discharge from the ears and nose, no pharynx injection, no tonsillar enlargement.        Neck: No JVD, no bruit, no mass felt. Heme: No neck lymph node enlargement. Cardiac: S1/S2, RRR, has murmurs, No gallops or rubs. Respiratory:  No rales, wheezing, rhonchi or rubs. GI: Soft, nondistended, nontender, no organomegaly, BS present. GU: No hematuria Ext: has trace leg edema bilaterally. 2+DP/PT pulse bilaterally. Musculoskeletal: No joint deformities, No joint redness or warmth, no limitation of ROM in spin. Skin: No rashes. Has bruise in posterior left upper arm Neuro: confused, knows her own name, not oriented to [facility] and time, cranial nerves II-XII grossly intact. Seems to have right-sided weakness (difficulty to assess due to altered mental status). Babinski's sign. Normal finger to nose test. Psych: Patient is not psychotic, no suicidal or hemocidal ideation.  Labs on Admission: I have personally reviewed following  labs and imaging studies  CBC: Recent Labs  Lab 09/27/18 2145  WBC 6.9    NEUTROABS 4.5  HGB 11.5*  HCT 37.7  MCV 93.8  PLT 169*   Basic Metabolic Panel: Recent Labs  Lab 09/27/18 2145  NA 136  K 4.6  CL 98  CO2 24  GLUCOSE 137*  BUN 59*  CREATININE 2.30*  CALCIUM 9.3   GFR: CrCl cannot be calculated (Unknown ideal weight.). Liver Function Tests: Recent Labs  Lab 09/27/18 2145  AST 21  ALT 15  ALKPHOS 117  BILITOT 0.6  PROT 7.3  ALBUMIN 4.0   No results for input(s): LIPASE, AMYLASE in the last 168 hours. No results for input(s): AMMONIA in the last 168 hours. Coagulation Profile: No results for input(s): INR, PROTIME in the last 168 hours. Cardiac Enzymes: No results for input(s): CKTOTAL, CKMB, CKMBINDEX, TROPONINI in the last 168 hours. BNP (last 3 results) No results for input(s): PROBNP in the last 8760 hours. HbA1C: No results for input(s): HGBA1C in the last 72 hours. CBG: No results for input(s): GLUCAP in the last 168 hours. Lipid Profile: No results for input(s): CHOL, HDL, LDLCALC, TRIG, CHOLHDL, LDLDIRECT in the last 72 hours. Thyroid Function Tests: No results for input(s): TSH, T4TOTAL, FREET4, T3FREE, THYROIDAB in the last 72 hours. Anemia Panel: No results for input(s): VITAMINB12, FOLATE, FERRITIN, TIBC, IRON, RETICCTPCT in the last 72 hours. Urine analysis:    Component Value Date/Time   COLORURINE YELLOW 09/28/2018 0230   APPEARANCEUR CLEAR 09/28/2018 0230   LABSPEC 1.013 09/28/2018 0230   PHURINE 5.0 09/28/2018 0230   GLUCOSEU NEGATIVE 09/28/2018 0230   HGBUR NEGATIVE 09/28/2018 0230   BILIRUBINUR NEGATIVE 09/28/2018 0230   KETONESUR NEGATIVE 09/28/2018 0230   PROTEINUR NEGATIVE 09/28/2018 0230   NITRITE NEGATIVE 09/28/2018 0230   LEUKOCYTESUR SMALL (A) 09/28/2018 0230   Sepsis Labs: @LABRCNTIP (procalcitonin:4,lacticidven:4) ) Recent Results (from the past 240 hour(s))  SARS Coronavirus 2 [facility] order, Performed in [facility] hospital lab)     Status: None   Collection Time: 09/28/18 12:54 AM   Result Value Ref Range Status   SARS Coronavirus 2 NEGATIVE NEGATIVE Final    Comment: (NOTE) SARS CoV 2 target nucleic acids are NOT DETECTED. The SARS CoV 2 RNA is generally detectable in upper and lower respiratory specimens during the acute phase of infection. The lowest concentration of SARS CoV 2 viral copies this assay can detect is 250 copies per mL. A negative result does not preclude SARS CoV 2 infection and should not be used as the sole basis for treatment or other patient management decisions. A negative result may occur with improper specimen collection and handling, submission  of specimen other than nasopharyngeal swab, presence of viral mutation(s) within the areas targeted by this assay, and inadequate number of viral copies (less than 250 copies per mL). A negative result must be combined with clinical observations, patient history,  and epidemiological information. The expected result is Negative. Fact Sheet for Patients:   https [employer] 678938 download Fact Sheet for Healthcare Providers:   https [employer] 772-248-7886 d Jenel Lucks This test is not yet approved or cleared by the [place] FDA and  has been authorized for detection and/or diagnosis of SARS CoV 2 by FDA under an Emergency Use Authorization (EUA).  This EUA will remain in effect (meaning this test can be used) for the duration of  the COVID19 declaration under Section 564(b)(1) of the Act, 21 U.S.C.  section 360bbb-3(b)(1),  unless the authorization is terminated or revoked sooner. Performed at [facility], [place] [address], [place], [place] [address]      Radiological Exams on Admission: Dg Chest 1 View  Result Date: 09/28/2018 CLINICAL DATA:  Weakness and failure to thrive EXAM: CHEST  1 VIEW COMPARISON:  07/31/2018 FINDINGS: Cardiac shadow is mildly enlarged but stable. The lungs are clear bilaterally. Postsurgical changes are noted. No acute bony abnormality is seen.  IMPRESSION: No acute abnormality noted. Electronically Signed   By: Inez Catalina M.D.   On: 09/28/2018 01:43   Dg Ankle Complete Right  Result Date: 09/28/2018 CLINICAL DATA:  Recent falls with right ankle pain, initial encounter EXAM: RIGHT ANKLE - COMPLETE 3+ VIEW COMPARISON:  None. FINDINGS: Postsurgical changes are noted in the first metatarsal bone. No acute fracture is identified. No soft tissue abnormality is seen. IMPRESSION: Chronic changes without acute abnormality. Electronically Signed   By: Inez Catalina M.D.   On: 09/28/2018 01:45   Ct Head Wo Contrast  Result Date: 09/28/2018 CLINICAL DATA:  Multiple falls.  Altered mental status EXAM: CT HEAD WITHOUT CONTRAST CT CERVICAL SPINE WITHOUT CONTRAST TECHNIQUE: Multidetector CT imaging of the head and cervical spine was performed following the standard protocol without intravenous contrast. Multiplanar CT image reconstructions of the cervical spine were also generated. 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 Extensive chronic small vessel disease throughout the deep white matter. Mild cerebral atrophy. No acute intracranial abnormality. Specifically, no hemorrhage, hydrocephalus, mass lesion, acute infarction, or significant intracranial injury. Vascular: No hyperdense vessel or unexpected calcification. Skull: No acute calvarial abnormality. Sinuses/Orbits: Visualized paranasal sinuses and mastoids clear. Orbital soft tissues unremarkable. Other: None CT CERVICAL SPINE FINDINGS Alignment: 4 mm of anterolisthesis of C3 on C4 related to facet disease. Skull base and vertebrae: No acute fracture. No primary bone lesion or focal pathologic process. Soft tissues and spinal canal: No prevertebral fluid or swelling. No visible canal hematoma. Disc levels:  Advanced diffuse degenerative disc and facet disease. 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 No acute findings. 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  Cerebral atrophy. No acute intracranial abnormality. Advanced degenerative changes.  No acute bony abnormality. Electronically Signed   By: Rolm Baptise M.D.   On: 09/28/2018 01:47   Ct Cervical Spine Wo Contrast  Result Date: 09/28/2018 CLINICAL DATA:  Multiple falls.  Altered mental status EXAM: CT HEAD WITHOUT CONTRAST CT CERVICAL SPINE WITHOUT CONTRAST TECHNIQUE: Multidetector CT imaging of the head and cervical spine was performed following the standard protocol without intravenous contrast. Multiplanar CT image reconstructions of the cervical spine were also generated. 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 Extensive chronic small vessel disease throughout the deep white matter. Mild cerebral atrophy. No acute intracranial abnormality. Specifically, no hemorrhage, hydrocephalus, mass lesion, acute infarction, or significant intracranial injury. Vascular: No hyperdense vessel or unexpected calcification. Skull: No acute calvarial abnormality. Sinuses/Orbits: Visualized paranasal sinuses and mastoids clear. Orbital soft tissues unremarkable. Other: None CT CERVICAL SPINE FINDINGS Alignment: 4 mm of anterolisthesis of C3 on C4 related to facet disease. Skull base and vertebrae: No acute fracture. No primary bone lesion or focal pathologic process. Soft tissues and spinal canal: No prevertebral fluid or swelling. No visible canal hematoma. Disc levels:  Advanced diffuse degenerative disc and facet disease. Upper chest:  No acute findings Other: Bilateral thyroid nodules, likely multinodular goiter. No acute findings. 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  Cerebral atrophy. No acute intracranial abnormality. Advanced  degenerative changes.  No acute bony abnormality. Electronically Signed   By: Rolm Baptise M.D.   On: 09/28/2018 01:47   Dg Knee Complete 4 Views Right  Result Date: 09/28/2018 CLINICAL DATA:  Multiple falls. EXAM: RIGHT KNEE - COMPLETE 4+ VIEW COMPARISON:  None. FINDINGS: Prior right knee replacement. No hardware complicating feature. No acute bony abnormality. Specifically, no fracture, subluxation, or dislocation. No joint effusion. IMPRESSION: Prior right knee replacement.  No acute bony abnormality. Electronically Signed   By: Rolm Baptise M.D.   On: 09/28/2018 01:44   Dg Humerus Left  Result Date: 09/28/2018 CLINICAL DATA:  Left arm pain following multiple falls EXAM: LEFT HUMERUS - 2+ VIEW COMPARISON:  None. FINDINGS: Left shoulder replacement is noted. No acute bony abnormality is seen. Postsurgical changes in the proximal ulna are noted. No soft tissue abnormality seen. IMPRESSION: Postsurgical changes without acute abnormality. Electronically Signed   By: Inez Catalina M.D.   On: 09/28/2018 01:43   Dg Hip Unilat W Or Wo Pelvis 2-3 Views Right  Result Date: 09/28/2018 CLINICAL DATA:  Multiple falls.  Altered mental status. EXAM: DG HIP (WITH OR WITHOUT PELVIS) 2-3V RIGHT COMPARISON:  None. FINDINGS: Prior left hip replacement. No acute bony abnormality. Specifically, no fracture, subluxation, or dislocation. Postoperative changes in the visualized lower lumbar spine. IMPRESSION: No acute bony abnormality. Electronically Signed   By: Rolm Baptise M.D.   On: 09/28/2018 01:43     EKG: Independently reviewed.  Sinus rhythm, QTC 470, LAE, early R wave progression, nonspecific T wave change.    Assessment/Plan Principal Problem:   Fall Active Problems:   PAT (paroxysmal atrial tachycardia) / MAT   Acute metabolic encephalopathy   Hypertension   CKD (chronic kidney disease), stage III (HCC)   Stroke (HCC)   Depression with anxiety   Chronic diastolic CHF (congestive heart failure) ([place])    Right sided weakness   Asthma   Fall: Patient has frequent fall and generalized weakness.  Etiology is not clear.  Urinalysis is not impressive (clear appearance, small amount of leukocyte, rare bacteria, WBC 6- 10).  Per her daughter, patient does not have symptoms of UTI.  Patient received 1 dose of Rocephin, will not continue Abx at this moment.  Will follow-up urine culture.  On examination, patient seems to have right-sided weakness, not sure if this is the new issues or not.  Will need to rule out stroke.  -will place on tele bed for obs -MRI-brain -PT/OT -consult CM and SW for possible rehab placement -f/u Urine culture  Acute metabolic encephalopathy: Etiology is not clear.  We need to rule out a stroke as above -Frequent neuro check -Follow-up MRI of brain -keep pt NPO until mental status improves  PAT (paroxysmal atrial tachycardia) / MAT: HR 65.  -continue metoprolol with holding parameters of SBP<95   Hypertension:  -Continue metoprolol with holding parameters of SBP<95 -IV hydralazine as needed  CKD (chronic kidney disease), stage III ([place]): Slightly worsening.  Patient had recent baseline creatinine 2.10 on 07/31/2018.  Her creatinine was 1.1 on 12/28/2016.  Today his creatinine is 2.30, BUN 59.  Possibly due to prerenal failure and continuation of diuretics and ibuprofen. -IV fluid: 500 cc normal saline, then 75 cc/h -Hold torsemide and ibuprofen - Follow-up by BMP  Hx of Stroke [facility]): -Continue aspirin  Depression with anxiety: Continue home Xanax and Wellbutrin, Prozac  Chronic diastolic CHF (congestive heart failure) ([place]): 2D echo 03/18/2016 showed EF of 65-70% with grade 1 diastolic dysfunction.  Patient has trace leg edema, but no JVD.  No pulmonary edema chest x-ray.  No shortness of breath.  CHF seem to be compensated. -Hold torsemide due to worsening renal function -Check BMP  Right sided weakness: -f/u MRI-brain to r/o stroke  Asthma: Stable. -Atrovent  nebulizer, PRN albuterol nebulizer, PRN Mucinex for cough   DVT ppx: SQ Heparin  Code Status: DNR per her daugher Family Communication:  Yes, patient's daughter by phone  Disposition Plan:  Anticipate discharge back to previous home environment Consults called:  none Admission status: Obs / tele     Date of Service 09/28/2018    [facility]   If 7PM-7AM, please contact night-coverage [email or web address] Password [email or web address] 09/28/2018, 4:28 AM

## 2018-09-28 NOTE — ED Notes (Signed)
ED TO INPATIENT HANDOFF REPORT  ED Nurse Name and Phone #:  [phone]  S Name/Age/Gender Shauniece [name] Points 78 y.o. female Room/Bed: 016C/016C  Code Status   Code Status: DNR  Home/SNF/Other Home Patient oriented to: self, place, time and situation Is this baseline? Yes   Triage Complete: Triage complete  Chief Complaint malaise  Triage Note Patient arrives from home with right lower leg pain, generalized weakness and failure to thrive per family.  Patient's family states she has been falling more lately.  Patient is CAOx4.     Allergies No Known Allergies  Level of Care/Admitting Diagnosis ED Disposition    ED Disposition Condition Comment   Admit  Hospital Area: [place] [[id number]]  Level of Care: Telemetry Medical [104]  I expect the patient will be discharged within 24 hours: No (not a candidate for 5C-Observation unit)  Covid Evaluation: Confirmed COVID Negative  Diagnosis: Fall [290176]  Admitting Physician: Ivor Costa [[id number]]  Attending Physician: Ivor Costa [[id number]]  PT Class (Do Not Modify): Observation [104]  PT Acc Code (Do Not Modify): Observation [10022]       B Medical/Surgery History Past Medical History:  Diagnosis Date  . Allergy    vasomotor rhinitis  . Anemia   . Asthma    h/o asthma and bronchitis  . BV (bacterial vaginosis)    history of frequent  . Cervical spondylosis    herniated disk protruding @ C6-7, impinging cord and left axillary root sleeve.  . Chronic pain   . Closed olecranon fracture, left, initial encounter   . Depression   . DVT of upper extremity (deep vein thrombosis) ([place])    h/o left(after wrist fracture/surgery); no residual thrombus or phlebitis by Dopplers July 2014  . Dyspnea    with exertion  . Edema   . Fibromyalgia    sees Dr.Phillips-pain management  . GERD (gastroesophageal reflux disease)    h/o  . Headache    marigraine- hormal - none in years  . Heart murmur   . Hypertension    never  has been treated that daughter is aware  . Neuropathy   . Neuropathy   . Osteopenia    mild at hip (T-1.3)  . PAT (paroxysmal atrial tachycardia) ([place])   . Pneumonia    years ago  . Recurrent falls 04/16/2016  . Varicose veins   . 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 Eilleen Kempf @ [facility]  . Vision problem    wears glasses  . Vitamin D deficiency    Past Surgical History:  Procedure Laterality Date  . ABDOMINAL HYSTERECTOMY  1999   BSO for endometriosis  . APPENDECTOMY    . BACK SURGERY  age 19   ruptured disk L3-4   . CATARACT EXTRACTION  2000   B/L  . COLONOSCOPY    . FINGER SURGERY     left finger for tender rupture from fall.  Marland Kitchen FOOT SURGERY     multiple(at least 4 on right, 5 on left)-left Dr.Kerner([place])11/08,left Dr.Nunley-excision 2nd and 3rd mt heads w/artho and hammertoe surgery 4th and 5th 04/2006. left great toe IP fusion and revision of fusion of 2nd through 5th toes 12/2005. Right foot surgeries  Dr.Bednarz 04/2008 and 04/2009.  Marland Kitchen KNEE SURGERY Right  right knee replacement  . NECK SURGERY  2007   cervical  . ORIF ELBOW FRACTURE Left 03/22/2016   Procedure: OPEN REDUCTION INTERNAL FIXATION (ORIF) ELBOW/OLECRANON FRACTURE;  Surgeon: Altamese Easthampton, MD;  Location: [place];  Service: Orthopedics;  Laterality: Left;  . ORIF ELBOW FRACTURE Left 04/15/2016   Procedure: OPEN REDUCTION INTERNAL FIXATION (ORIF) LEFT ELBOW/OLECRANON FRACTURE;  Surgeon: Altamese La Villa, MD;  Location: [place];  Service: Orthopedics;  Laterality: Left;  . RADIOLOGY WITH ANESTHESIA Left 01/06/2016   Procedure: MRI LEFT HIP WITH AND WITHOUT;  Surgeon: Medication Radiologist, MD;  Location: [place];  Service: Radiology;  Laterality: Left;  . REFRACTIVE SURGERY  2007   left eye  . REVERSE SHOULDER  ARTHROPLASTY Left 12/31/2016   Procedure: REVERSE LEFT SHOULDER ARTHROPLASTY;  Surgeon: Netta Cedars, MD;  Location: [place];  Service: Orthopedics;  Laterality: Left;  . SHOULDER SURGERY     x8 (4 on rt side and 4 on left side)  . SPINAL FUSION  6/09   Dr.Cohen  . STERIOD INJECTION Left 04/15/2016   Procedure: LEFT HIP STEROID INJECTION;  Surgeon: Altamese Aberdeen, MD;  Location: [place];  Service: Orthopedics;  Laterality: Left;  . TONSILLECTOMY AND ADENOIDECTOMY    . TOTAL HIP ARTHROPLASTY Left 08/18/2016   Procedure: LEFT TOTAL HIP ARTHROPLASTY ANTERIOR APPROACH;  Surgeon: Gaynelle Arabian, MD;  Location: [facility];  Service: Orthopedics;  Laterality: Left;  . TRANSTHORACIC ECHOCARDIOGRAM  July 2012   Normal EF 60-65% , mild concentric LVH. Grade 2 diastolic dysfunction with elevated EDP. Massive left atrial dilation. Pulmonary pressures 30-40 mm Hg; aortic sclerosis but no stenosis. Moderate TR  . TRANSTHORACIC ECHOCARDIOGRAM  03/2016   EF 65-70%. Normal wall motion. GR 1 DD. Severe LA dilation. Mild to moderate TR with mild to moderately elevated PA pressures (44 mmHg) mild aortic calcification but no stenosis. No source of emboli   . WRIST SURGERY Left    2 surgeries left wrist after fracture(complicated by DVT)     A IV Location/Drains/Wounds Patient Lines/Drains/Airways Status   Active Line/Drains/Airways    Name:   Placement date:   Placement time:   Site:   Days:   Peripheral IV 09/28/18 Right;Anterior Forearm   09/28/18    0047    Forearm   less than 1          Intake/Output Last 24 hours  Intake/Output Summary (Last 24 hours) at 09/28/2018 0424 Last data filed at 09/28/2018 0229 Gross per 24 hour  Intake 500 ml  Output -  Net 500 ml    Labs/Imaging Results for orders placed or performed during the hospital encounter of 09/27/18 (from the past 48 hour(s))  CBC with Differential     Status: Abnormal   Collection Time: 09/27/18  9:45 PM  Result Value Ref Range   WBC 6.9 4.0 -  10.5 K/uL   RBC 4.02 3.87 - 5.11 MIL/uL   Hemoglobin 11.5 (L) 12.0 - 15.0 g/dL   HCT 37.7 36.0 - 46.0 %   MCV 93.8 80.0 - 100.0 fL   MCH 28.6 26.0 - 34.0 pg   MCHC 30.5 30.0 - 36.0 g/dL   RDW 13.8 11.5 - 15.5 %   Platelets 893 (H) 150 - 400 K/uL    Comment: REPEATED TO VERIFY PLATELET COUNT CONFIRMED BY SMEAR    nRBC 0.0 0.0 - 0.2 %   Neutrophils Relative % 65 %   Neutro Abs 4.5 1.7 - 7.7 K/uL   Lymphocytes Relative 21 %  Lymphs Abs 1.4 0.7 - 4.0 K/uL   Monocytes Relative 9 %   Monocytes Absolute 0.6 0.1 - 1.0 K/uL   Eosinophils Relative 4 %   Eosinophils Absolute 0.3 0.0 - 0.5 K/uL   Basophils Relative 1 %   Basophils Absolute 0.1 0.0 - 0.1 K/uL   Immature Granulocytes 0 %   Abs Immature Granulocytes 0.03 0.00 - 0.07 K/uL    Comment: Performed at [facility], [address], [place], [place] [address]  Comprehensive metabolic panel     Status: Abnormal   Collection Time: 09/27/18  9:45 PM  Result Value Ref Range   Sodium 136 135 - 145 mmol/L   Potassium 4.6 3.5 - 5.1 mmol/L   Chloride 98 98 - 111 mmol/L   CO2 24 22 - 32 mmol/L   Glucose, Bld 137 (H) 70 - 99 mg/dL   BUN 59 (H) 8 - 23 mg/dL   Creatinine, Ser 2.30 (H) 0.44 - 1.00 mg/dL   Calcium 9.3 8.9 - 10.3 mg/dL   Total Protein 7.3 6.5 - 8.1 g/dL   Albumin 4.0 3.5 - 5.0 g/dL   AST 21 15 - 41 U/L   ALT 15 0 - 44 U/L   Alkaline Phosphatase 117 38 - 126 U/L   Total Bilirubin 0.6 0.3 - 1.2 mg/dL   GFR calc non Af Amer 19 (L) >60 mL/min   GFR calc Af Amer 23 (L) >60 mL/min   Anion gap 14 5 - 15    Comment: Performed at [facility] [address] [place], [place] [address]  SARS Coronavirus 2 [facility] order, Performed in [facility] hospital lab)     Status: None   Collection Time: 09/28/18 12:54 AM  Result Value Ref Range   SARS Coronavirus 2 NEGATIVE NEGATIVE    Comment: (NOTE) SARS CoV 2 target nucleic acids are NOT DETECTED. The SARS CoV 2 RNA is generally detectable in upper and  lower respiratory specimens during the acute phase of infection. The lowest concentration of SARS CoV 2 viral copies this assay can detect is 250 copies per mL. A negative result does not preclude SARS CoV 2 infection and should not be used as the sole basis for treatment or other patient management decisions. A negative result may occur with improper specimen collection and handling, submission  of specimen other than nasopharyngeal swab, presence of viral mutation(s) within the areas targeted by this assay, and inadequate number of viral copies (less than 250 copies per mL). A negative result must be combined with clinical observations, patient history,  and epidemiological information. The expected result is Negative. Fact Sheet for Patients:   https [employer] 109323 download Fact Sheet for Healthcare Providers:   https [employer] 971-163-7721 d Jenel Lucks This test is not yet approved or cleared by the [place] FDA and  has been authorized for detection and/or diagnosis of SARS CoV 2 by FDA under an Emergency Use Authorization (EUA).  This EUA will remain in effect (meaning this test can be used) for the duration of  the COVID19 declaration under Section 564(b)(1) of the Act, 21 U.S.C.  section 360bbb-3(b)(1), unless the authorization is terminated or revoked sooner. Performed at [facility], [place] [address], [place], [place] [address]   Urinalysis, Routine w reflex microscopic     Status: Abnormal   Collection Time: 09/28/18  2:30 AM  Result Value Ref Range   Color, Urine YELLOW YELLOW   APPearance CLEAR CLEAR  Specific Gravity, Urine 1.013 1.005 - 1.030   pH 5.0 5.0 - 8.0   Glucose, UA NEGATIVE NEGATIVE mg/dL   Hgb urine dipstick NEGATIVE NEGATIVE   Bilirubin Urine NEGATIVE NEGATIVE   Ketones, ur NEGATIVE NEGATIVE mg/dL   Protein, ur NEGATIVE NEGATIVE mg/dL   Nitrite NEGATIVE NEGATIVE   Leukocytes,Ua SMALL (A) NEGATIVE   RBC / HPF 0-5 0 - 5 RBC/hpf    WBC, UA 6-10 0 - 5 WBC/hpf   Bacteria, UA RARE (A) NONE SEEN   Squamous Epithelial / LPF 0-5 0 - 5   Mucus PRESENT    Hyaline Casts, UA PRESENT     Comment: Performed at [facility], [place] [address], [place], [place] [address]   Dg Chest 1 View  Result Date: 09/28/2018 CLINICAL DATA:  Weakness and failure to thrive EXAM: CHEST  1 VIEW COMPARISON:  07/31/2018 FINDINGS: Cardiac shadow is mildly enlarged but stable. The lungs are clear bilaterally. Postsurgical changes are noted. No acute bony abnormality is seen. IMPRESSION: No acute abnormality noted. Electronically Signed   By: Inez Catalina M.D.   On: 09/28/2018 01:43   Dg Ankle Complete Right  Result Date: 09/28/2018 CLINICAL DATA:  Recent falls with right ankle pain, initial encounter EXAM: RIGHT ANKLE - COMPLETE 3+ VIEW COMPARISON:  None. FINDINGS: Postsurgical changes are noted in the first metatarsal bone. No acute fracture is identified. No soft tissue abnormality is seen. IMPRESSION: Chronic changes without acute abnormality. Electronically Signed   By: Inez Catalina M.D.   On: 09/28/2018 01:45   Ct Head Wo Contrast  Result Date: 09/28/2018 CLINICAL DATA:  Multiple falls.  Altered mental status EXAM: CT HEAD WITHOUT CONTRAST CT CERVICAL SPINE WITHOUT CONTRAST TECHNIQUE: Multidetector CT imaging of the head and cervical spine was performed following the standard protocol without intravenous contrast. Multiplanar CT image reconstructions of the cervical spine were also generated. 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 Extensive chronic small vessel disease throughout the deep white matter. Mild cerebral atrophy. No acute intracranial abnormality. Specifically, no hemorrhage, hydrocephalus, mass lesion, acute infarction, or significant intracranial injury. Vascular: No hyperdense vessel or unexpected calcification. Skull: No acute calvarial abnormality.  Sinuses/Orbits: Visualized paranasal sinuses and mastoids clear. Orbital soft tissues unremarkable. Other: None CT CERVICAL SPINE FINDINGS Alignment: 4 mm of anterolisthesis of C3 on C4 related to facet disease. Skull base and vertebrae: No acute fracture. No primary bone lesion or focal pathologic process. Soft tissues and spinal canal: No prevertebral fluid or swelling. No visible canal hematoma. Disc levels:  Advanced diffuse degenerative disc and facet disease. 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 No acute findings. 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  Cerebral atrophy. No acute intracranial abnormality. Advanced degenerative changes.  No acute bony abnormality. Electronically Signed   By: Rolm Baptise M.D.   On: 09/28/2018 01:47   Ct Cervical Spine Wo Contrast  Result Date: 09/28/2018 CLINICAL DATA:  Multiple falls.  Altered mental status EXAM: CT HEAD WITHOUT CONTRAST CT CERVICAL SPINE WITHOUT CONTRAST TECHNIQUE: Multidetector CT imaging of the head and cervical spine was performed following the standard protocol without intravenous contrast. Multiplanar CT image reconstructions of the cervical spine were also generated. 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 Extensive chronic small vessel disease throughout the deep white matter. Mild cerebral atrophy. No acute  intracranial abnormality. Specifically, no hemorrhage, hydrocephalus, mass lesion, acute infarction, or significant intracranial injury. Vascular: No hyperdense vessel or unexpected calcification. Skull: No acute calvarial abnormality. Sinuses/Orbits: Visualized paranasal sinuses and mastoids clear. Orbital soft tissues unremarkable. Other: None CT CERVICAL SPINE FINDINGS Alignment: 4 mm of anterolisthesis of C3 on C4 related to facet disease. Skull  base and vertebrae: No acute fracture. No primary bone lesion or focal pathologic process. Soft tissues and spinal canal: No prevertebral fluid or swelling. No visible canal hematoma. Disc levels:  Advanced diffuse degenerative disc and facet disease. 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 No acute findings. 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  Cerebral atrophy. No acute intracranial abnormality. Advanced degenerative changes.  No acute bony abnormality. Electronically Signed   By: Rolm Baptise M.D.   On: 09/28/2018 01:47   Dg Knee Complete 4 Views Right  Result Date: 09/28/2018 CLINICAL DATA:  Multiple falls. EXAM: RIGHT KNEE - COMPLETE 4+ VIEW COMPARISON:  None. FINDINGS: Prior right knee replacement. No hardware complicating feature. No acute bony abnormality. Specifically, no fracture, subluxation, or dislocation. No joint effusion. IMPRESSION: Prior right knee replacement.  No acute bony abnormality. Electronically Signed   By: Rolm Baptise M.D.   On: 09/28/2018 01:44   Dg Humerus Left  Result Date: 09/28/2018 CLINICAL DATA:  Left arm pain following multiple falls EXAM: LEFT HUMERUS - 2+ VIEW COMPARISON:  None. FINDINGS: Left shoulder replacement is noted. No acute bony abnormality is seen. Postsurgical changes in the proximal ulna are noted. No soft tissue abnormality seen. IMPRESSION: Postsurgical changes without acute abnormality. Electronically Signed   By: Inez Catalina M.D.   On: 09/28/2018 01:43   Dg Hip Unilat W Or Wo Pelvis 2-3 Views Right  Result Date: 09/28/2018 CLINICAL DATA:  Multiple falls.  Altered mental status. EXAM: DG HIP (WITH OR WITHOUT PELVIS) 2-3V RIGHT COMPARISON:  None. FINDINGS: Prior left hip replacement. No acute bony abnormality. Specifically, no fracture, subluxation, or dislocation. Postoperative changes in the visualized lower lumbar spine. IMPRESSION:  No acute bony abnormality. Electronically Signed   By: Rolm Baptise M.D.   On: 09/28/2018 01:43    Pending Labs Unresulted Labs (From admission, onward)    Start     Ordered   09/28/18 0300  Urine culture  ONCE - STAT,   STAT     09/28/18 0259   Signed and Held  Brain natriuretic peptide  ONCE - STAT,   R     Signed and Held   Signed and Held  TSH  Once,   R     Signed and Held   Signed and Occupational hygienist morning,   R     Signed and Held   Signed and Held  CBC  Tomorrow morning,   R     Signed and Held          Vitals/Pain Today's Vitals   09/28/18 0200 09/28/18 0233 09/28/18 0330 09/28/18 0415  BP: 121/66  (!) 93/42 (!) 98/53  Pulse: 65  65 65  Resp: (!) 21  18 14   Temp:      TempSrc:      SpO2: 92%  94% 95%  PainSc:  Asleep      Isolation Precautions No active isolations  Medications Medications  0.9 %  sodium chloride infusion ( Intravenous Rate/Dose Change 09/28/18 0233)  cefTRIAXone (ROCEPHIN) 1 g in sodium chloride  0.9 % 100 mL IVPB (1 g Intravenous New Bag/Given 09/28/18 0403)  sodium chloride 0.9 % bolus 500 mL (0 mLs Intravenous Stopped 09/28/18 0229)  acetaminophen (TYLENOL) tablet 1,000 mg (1,000 mg Oral Given 09/28/18 0156)    Mobility Prior to today; pt was ambulatory with device. Unable to walk at all beginning yesterday High fall risk   Focused Assessments Neuro Assessment Handoff:  Swallow screen pass? n/a   NIH Stroke Scale ( + Modified Stroke Scale Criteria)  Interval: Initial Level of Consciousness (1a.)   : Alert, keenly responsive LOC Questions (1b. )   +: Answers both questions correctly LOC Commands (1c. )   + : Performs both tasks correctly Best Gaze (2. )  +: Normal Visual (3. )  +: No visual loss Facial Palsy (4. )    : Normal symmetrical movements Motor Arm, Left (5a. )   +: No drift Motor Arm, Right (5b. )   +: Drift Motor Leg, Left (6a. )   +: No drift Motor Leg, Right (6b. )   +: Drift Limb Ataxia (7. ):  Absent Sensory (8. )   +: Normal, no sensory loss Best Language (9. )   +: No aphasia Dysarthria (10. ): Normal Extinction/Inattention (11.)   +: No Abnormality Modified SS Total  +: 2 Complete NIHSS TOTAL: 2     Neuro Assessment: Exceptions to WDL Neuro Checks:   Initial (09/28/18 0022)  Last Documented NIHSS Modified Score: 2 (09/28/18 0022) Has TPA been given? No If patient is a Neuro Trauma and patient is going to OR before floor call report to [facility] nurse: [phone] or [phone]     R Recommendations: See Admitting Provider Note  Report given to:   Additional Notes:  Lives at home with husband; home health aid there during the day.

## 2018-09-28 NOTE — ED Notes (Signed)
ED TO INPATIENT HANDOFF REPORT  ED Nurse Name and Phone    Jenny Reichmann [phone]  S Name/Age/Gender Shirley [name] 78 y.o. female Room/Bed: 039C/039C  Code Status   Code Status: DNR  Home/SNF/Other Home Patient oriented to: self Is this baseline? Yes   Triage Complete: Triage complete  Chief Complaint malaise  Triage Note Patient arrives from home with right lower leg pain, generalized weakness and failure to thrive per family.  Patient's family states she has been falling more lately.  Patient is CAOx4.     Allergies No Known Allergies  Level of Care/Admitting Diagnosis ED Disposition    ED Disposition Condition Comment   Admit  Hospital Area: [facility] [[id number]]  Level of Care: Telemetry Medical [104]  I expect the patient will be discharged within 24 hours: No (not a candidate for 5C-Observation unit)  Covid Evaluation: Confirmed COVID Negative  Diagnosis: Fall [290176]  Admitting Physician: Ivor Costa [[id number]]  Attending Physician: Ivor Costa [[id number]]  PT Class (Do Not Modify): Observation [104]  PT Acc Code (Do Not Modify): Observation [10022]       B Medical/Surgery History Past Medical History:  Diagnosis Date  . Allergy    vasomotor rhinitis  . Anemia   . Asthma    h/o asthma and bronchitis  . BV (bacterial vaginosis)    history of frequent  . Cervical spondylosis    herniated disk protruding @ C6-7, impinging cord and left axillary root sleeve.  . Chronic pain   . Closed olecranon fracture, left, initial encounter   . Depression   . DVT of upper extremity (deep vein thrombosis) ([place])    h/o left(after wrist fracture/surgery); no residual thrombus or phlebitis by Dopplers July 2014  . Dyspnea    with exertion  . Edema   . Fibromyalgia    sees Dr.Phillips-pain management  . GERD (gastroesophageal reflux disease)    h/o  . Headache    marigraine- hormal - none in years  . Heart murmur   . Hypertension    never has been treated that  daughter is aware  . Neuropathy   . Neuropathy   . Osteopenia    mild at hip (T-1.3)  . PAT (paroxysmal atrial tachycardia) ([place])   . Pneumonia    years ago  . Recurrent falls 04/16/2016  . Varicose veins   . 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 Eilleen Kempf @ [facility]  . Vision problem    wears glasses  . Vitamin D deficiency    Past Surgical History:  Procedure Laterality Date  . ABDOMINAL HYSTERECTOMY  1999   BSO for endometriosis  . APPENDECTOMY    . BACK SURGERY  age 82   ruptured disk L3-4   . CATARACT EXTRACTION  2000   B/L  . COLONOSCOPY    . FINGER SURGERY     left finger for tender rupture from fall.  Marland Kitchen FOOT SURGERY     multiple(at least 4 on right, 5 on left)-left Dr.Kerner([place])11/08,left Dr.Nunley-excision 2nd and 3rd mt heads w/artho and hammertoe surgery 4th and 5th 04/2006. left great toe IP fusion and revision of fusion of 2nd through 5th toes 12/2005. Right foot surgeries  Dr.Bednarz 04/2008 and 04/2009.  Marland Kitchen KNEE SURGERY Right    right knee  replacement  . NECK SURGERY  2007   cervical  . ORIF ELBOW FRACTURE Left 03/22/2016   Procedure: OPEN REDUCTION INTERNAL FIXATION (ORIF) ELBOW/OLECRANON FRACTURE;  Surgeon: Altamese Botkins, MD;  Location: [facility];  Service: Orthopedics;  Laterality: Left;  . ORIF ELBOW FRACTURE Left 04/15/2016   Procedure: OPEN REDUCTION INTERNAL FIXATION (ORIF) LEFT ELBOW/OLECRANON FRACTURE;  Surgeon: Altamese [place], MD;  Location: [facility];  Service: Orthopedics;  Laterality: Left;  . RADIOLOGY WITH ANESTHESIA Left 01/06/2016   Procedure: MRI LEFT HIP WITH AND WITHOUT;  Surgeon: Medication Radiologist, MD;  Location: [facility];  Service: Radiology;  Laterality: Left;  . REFRACTIVE SURGERY  2007   left eye  . REVERSE SHOULDER ARTHROPLASTY Left  12/31/2016   Procedure: REVERSE LEFT SHOULDER ARTHROPLASTY;  Surgeon: Netta Cedars, MD;  Location: [facility];  Service: Orthopedics;  Laterality: Left;  . SHOULDER SURGERY     x8 (4 on rt side and 4 on left side)  . SPINAL FUSION  6/09   Dr.Cohen  . STERIOD INJECTION Left 04/15/2016   Procedure: LEFT HIP STEROID INJECTION;  Surgeon: Altamese [place], MD;  Location: [facility];  Service: Orthopedics;  Laterality: Left;  . TONSILLECTOMY AND ADENOIDECTOMY    . TOTAL HIP ARTHROPLASTY Left 08/18/2016   Procedure: LEFT TOTAL HIP ARTHROPLASTY ANTERIOR APPROACH;  Surgeon: Gaynelle Arabian, MD;  Location: [facility];  Service: Orthopedics;  Laterality: Left;  . TRANSTHORACIC ECHOCARDIOGRAM  July 2012   Normal EF 60-65% , mild concentric LVH. Grade 2 diastolic dysfunction with elevated EDP. Massive left atrial dilation. Pulmonary pressures 30-40 mm Hg; aortic sclerosis but no stenosis. Moderate TR  . TRANSTHORACIC ECHOCARDIOGRAM  03/2016   EF 65-70%. Normal wall motion. GR 1 DD. Severe LA dilation. Mild to moderate TR with mild to moderately elevated PA pressures (44 mmHg) mild aortic calcification but no stenosis. No source of emboli   . WRIST SURGERY Left    2 surgeries left wrist after fracture(complicated by DVT)     A IV Location/Drains/Wounds Patient Lines/Drains/Airways Status   Active Line/Drains/Airways    Name:   Placement date:   Placement time:   Site:   Days:   Peripheral IV 09/28/18 Right;Anterior Forearm   09/28/18    0047    Forearm   less than 1          Intake/Output Last 24 hours  Intake/Output Summary (Last 24 hours) at 09/28/2018 1131 Last data filed at 09/28/2018 0229 Gross per 24 hour  Intake 500 ml  Output -  Net 500 ml    Labs/Imaging Results for orders placed or performed during the hospital encounter of 09/27/18 (from the past 48 hour(s))  CBC with Differential     Status: Abnormal   Collection Time: 09/27/18  9:45 PM  Result Value Ref Range   WBC 6.9 4.0 - 10.5 K/uL   RBC 4.02  3.87 - 5.11 MIL/uL   Hemoglobin 11.5 (L) 12.0 - 15.0 g/dL   HCT 37.7 36.0 - 46.0 %   MCV 93.8 80.0 - 100.0 fL   MCH 28.6 26.0 - 34.0 pg   MCHC 30.5 30.0 - 36.0 g/dL   RDW 13.8 11.5 - 15.5 %   Platelets 893 (H) 150 - 400 K/uL    Comment: REPEATED TO VERIFY PLATELET COUNT CONFIRMED BY SMEAR    nRBC 0.0 0.0 - 0.2 %   Neutrophils Relative % 65 %   Neutro Abs 4.5 1.7 - 7.7 K/uL   Lymphocytes Relative 21 %   Lymphs  Abs 1.4 0.7 - 4.0 K/uL   Monocytes Relative 9 %   Monocytes Absolute 0.6 0.1 - 1.0 K/uL   Eosinophils Relative 4 %   Eosinophils Absolute 0.3 0.0 - 0.5 K/uL   Basophils Relative 1 %   Basophils Absolute 0.1 0.0 - 0.1 K/uL   Immature Granulocytes 0 %   Abs Immature Granulocytes 0.03 0.00 - 0.07 K/uL    Comment: Performed at [facility], [place] [address] [place], [place] [address]  Comprehensive metabolic panel     Status: Abnormal   Collection Time: 09/27/18  9:45 PM  Result Value Ref Range   Sodium 136 135 - 145 mmol/L   Potassium 4.6 3.5 - 5.1 mmol/L   Chloride 98 98 - 111 mmol/L   CO2 24 22 - 32 mmol/L   Glucose, Bld 137 (H) 70 - 99 mg/dL   BUN 59 (H) 8 - 23 mg/dL   Creatinine, Ser 2.30 (H) 0.44 - 1.00 mg/dL   Calcium 9.3 8.9 - 10.3 mg/dL   Total Protein 7.3 6.5 - 8.1 g/dL   Albumin 4.0 3.5 - 5.0 g/dL   AST 21 15 - 41 U/L   ALT 15 0 - 44 U/L   Alkaline Phosphatase 117 38 - 126 U/L   Total Bilirubin 0.6 0.3 - 1.2 mg/dL   GFR calc non Af Amer 19 (L) >60 mL/min   GFR calc Af Amer 23 (L) >60 mL/min   Anion gap 14 5 - 15    Comment: Performed at [facility] [address], [place], [place] [address]  SARS Coronavirus 2 [facility] order, Performed in [facility] hospital lab)     Status: None   Collection Time: 09/28/18 12:54 AM  Result Value Ref Range   SARS Coronavirus 2 NEGATIVE NEGATIVE    Comment: (NOTE) SARS CoV 2 target nucleic acids are NOT DETECTED. The SARS CoV 2 RNA is generally detectable in upper and lower respiratory specimens during  the acute phase of infection. The lowest concentration of SARS CoV 2 viral copies this assay can detect is 250 copies per mL. A negative result does not preclude SARS CoV 2 infection and should not be used as the sole basis for treatment or other patient management decisions. A negative result may occur with improper specimen collection and handling, submission  of specimen other than nasopharyngeal swab, presence of viral mutation(s) within the areas targeted by this assay, and inadequate number of viral copies (less than 250 copies per mL). A negative result must be combined with clinical observations, patient history,  and epidemiological information. The expected result is Negative. Fact Sheet for Patients:   https [employer] 833825 download Fact Sheet for Healthcare Providers:   https [employer] media 847-182-6855 d Jenel Lucks This test is not yet approved or cleared by the [place] FDA and  has been authorized for detection and/or diagnosis of SARS CoV 2 by FDA under an Emergency Use Authorization (EUA).  This EUA will remain in effect (meaning this test can be used) for the duration of  the COVID19 declaration under Section 564(b)(1) of the Act, 21 U.S.C.  section 360bbb-3(b)(1), unless the authorization is terminated or revoked sooner. Performed at [facility], [place] [address] [place], [place] [address]   Urinalysis, Routine w reflex microscopic     Status: Abnormal   Collection Time: 09/28/18  2:30 AM  Result Value Ref Range   Color, Urine YELLOW YELLOW   APPearance CLEAR CLEAR  Specific Gravity, Urine 1.013 1.005 - 1.030   pH 5.0 5.0 - 8.0   Glucose, UA NEGATIVE NEGATIVE mg/dL   Hgb urine dipstick NEGATIVE NEGATIVE   Bilirubin Urine NEGATIVE NEGATIVE   Ketones, ur NEGATIVE NEGATIVE mg/dL   Protein, ur NEGATIVE NEGATIVE mg/dL   Nitrite NEGATIVE NEGATIVE   Leukocytes,Ua SMALL (A) NEGATIVE   RBC / HPF 0-5 0 - 5 RBC/hpf   WBC, UA 6-10 0 - 5 WBC/hpf    Bacteria, UA RARE (A) NONE SEEN   Squamous Epithelial / LPF 0-5 0 - 5   Mucus PRESENT    Hyaline Casts, UA PRESENT     Comment: Performed at [facility], [place] [address], [place], [place] [address]  Brain natriuretic peptide     Status: Abnormal   Collection Time: 09/28/18  9:38 AM  Result Value Ref Range   B Natriuretic Peptide 249.8 (H) 0.0 - 100.0 pg/mL    Comment: Performed at [facility] [address], [place], [place] [address]  TSH     Status: None   Collection Time: 09/28/18  9:38 AM  Result Value Ref Range   TSH 2.125 0.350 - 4.500 uIU/mL    Comment: Performed by a 3rd Generation assay with a functional sensitivity of <=0.01 uIU/mL. Performed at [facility], [place] [address] [place], [place] [address]   Basic metabolic panel     Status: Abnormal   Collection Time: 09/28/18  9:38 AM  Result Value Ref Range   Sodium 140 135 - 145 mmol/L   Potassium 4.5 3.5 - 5.1 mmol/L   Chloride 104 98 - 111 mmol/L   CO2 25 22 - 32 mmol/L   Glucose, Bld 109 (H) 70 - 99 mg/dL   BUN 49 (H) 8 - 23 mg/dL   Creatinine, Ser 1.98 (H) 0.44 - 1.00 mg/dL   Calcium 8.8 (L) 8.9 - 10.3 mg/dL   GFR calc non Af Amer 23 (L) >60 mL/min   GFR calc Af Amer 27 (L) >60 mL/min   Anion gap 11 5 - 15    Comment: Performed at [facility] [address], [place], [place] [address]  CBC     Status: Abnormal   Collection Time: 09/28/18  9:38 AM  Result Value Ref Range   WBC 5.5 4.0 - 10.5 K/uL   RBC 3.58 (L) 3.87 - 5.11 MIL/uL   Hemoglobin 10.1 (L) 12.0 - 15.0 g/dL   HCT 33.7 (L) 36.0 - 46.0 %   MCV 94.1 80.0 - 100.0 fL   MCH 28.2 26.0 - 34.0 pg   MCHC 30.0 30.0 - 36.0 g/dL   RDW 13.8 11.5 - 15.5 %   Platelets 682 (H) 150 - 400 K/uL   nRBC 0.0 0.0 - 0.2 %    Comment: Performed at [facility], [place] [address], [place],  [address]   Dg Chest 1 View  Result Date: 09/28/2018 CLINICAL DATA:  Weakness and failure to thrive EXAM: CHEST  1 VIEW COMPARISON:  07/31/2018  FINDINGS: Cardiac shadow is mildly enlarged but stable. The lungs are clear bilaterally. Postsurgical changes are noted. No acute bony abnormality is seen. IMPRESSION: No acute abnormality noted. Electronically Signed   By: Inez Catalina M.D.   On: 09/28/2018 01:43   Dg Ankle Complete Right  Result Date: 09/28/2018 CLINICAL DATA:  Recent falls with right ankle pain, initial encounter EXAM: RIGHT ANKLE - COMPLETE 3+ VIEW COMPARISON:  None. FINDINGS: Postsurgical  changes are noted in the first metatarsal bone. No acute fracture is identified. No soft tissue abnormality is seen. IMPRESSION: Chronic changes without acute abnormality. Electronically Signed   By: Inez Catalina M.D.   On: 09/28/2018 01:45   Ct Head Wo Contrast  Result Date: 09/28/2018 CLINICAL DATA:  Multiple falls.  Altered mental status EXAM: CT HEAD WITHOUT CONTRAST CT CERVICAL SPINE WITHOUT CONTRAST TECHNIQUE: Multidetector CT imaging of the head and cervical spine was performed following the standard protocol without intravenous contrast. Multiplanar CT image reconstructions of the cervical spine were also generated. 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 Extensive chronic small vessel disease throughout the deep white matter. Mild cerebral atrophy. No acute intracranial abnormality. Specifically, no hemorrhage, hydrocephalus, mass lesion, acute infarction, or significant intracranial injury. Vascular: No hyperdense vessel or unexpected calcification. Skull: No acute calvarial abnormality. Sinuses/Orbits: Visualized paranasal sinuses and mastoids clear. Orbital soft tissues unremarkable. Other: None CT CERVICAL SPINE FINDINGS Alignment: 4 mm of anterolisthesis of C3 on C4 related to facet disease. Skull base and vertebrae: No acute fracture. No primary bone lesion or focal pathologic process. Soft tissues and spinal canal: No prevertebral fluid or swelling. No visible  canal hematoma. Disc levels:  Advanced diffuse degenerative disc and facet disease. 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 No acute findings. 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  Cerebral atrophy. No acute intracranial abnormality. Advanced degenerative changes.  No acute bony abnormality. Electronically Signed   By: Rolm Baptise M.D.   On: 09/28/2018 01:47   Ct Cervical Spine Wo Contrast  Result Date: 09/28/2018 CLINICAL DATA:  Multiple falls.  Altered mental status EXAM: CT HEAD WITHOUT CONTRAST CT CERVICAL SPINE WITHOUT CONTRAST TECHNIQUE: Multidetector CT imaging of the head and cervical spine was performed following the standard protocol without intravenous contrast. Multiplanar CT image reconstructions of the cervical spine were also generated. 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 Extensive chronic small vessel disease throughout the deep white matter. Mild cerebral atrophy. No acute intracranial abnormality. Specifically, no hemorrhage, hydrocephalus, mass lesion, acute infarction, or significant intracranial injury. Vascular: No hyperdense vessel or unexpected calcification. Skull: No acute calvarial abnormality. Sinuses/Orbits: Visualized paranasal sinuses and mastoids clear. Orbital soft tissues unremarkable. Other: None CT CERVICAL SPINE FINDINGS Alignment: 4 mm of anterolisthesis of C3 on C4 related to facet disease. Skull base and vertebrae: No acute fracture. No primary bone lesion or focal pathologic process. Soft tissues and spinal canal: No prevertebral fluid or swelling. No visible canal hematoma. Disc levels:  Advanced diffuse degenerative disc and facet disease. 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 No acute findings. IMPRESSION: Old left  occipital and posterior parietal infarcts. Extensive chronic small vessel disease.  Cerebral atrophy. No acute intracranial abnormality. Advanced degenerative changes.  No acute bony abnormality. Electronically Signed   By: Rolm Baptise M.D.   On: 09/28/2018 01:47   Mr Brain Wo Contrast  Result Date: 09/28/2018 CLINICAL DATA:  Right-sided weakness EXAM: MRI HEAD WITHOUT CONTRAST TECHNIQUE: Multiplanar, multiecho pulse sequences of the brain and surrounding structures were obtained without intravenous contrast. COMPARISON:  Head CT from earlier today FINDINGS: Brain: No acute infarction, hemorrhage, hydrocephalus, extra-axial collection or mass lesion. Two small to moderate remote left occipital infarcts. Small remote high left parietal cortex infarct. Confluent FLAIR hyperintensity throughout  the cerebral white matter. Mild small vessel ischemic type change in the pons. Brain volume is normal. Vascular: Major flow voids are preserved Skull and upper cervical spine: Negative for marrow lesion. Cervical spine degeneration with retro dental ligament thickening and C3-4 anterolisthesis. Sinuses/Orbits: Mild right mastoid opacification. Negative sinuses and orbits. IMPRESSION: 1. No acute finding. 2. Extensive chronic ischemic injury as described. Electronically Signed   By: Monte Fantasia M.D.   On: 09/28/2018 06:54   Dg Knee Complete 4 Views Right  Result Date: 09/28/2018 CLINICAL DATA:  Multiple falls. EXAM: RIGHT KNEE - COMPLETE 4+ VIEW COMPARISON:  None. FINDINGS: Prior right knee replacement. No hardware complicating feature. No acute bony abnormality. Specifically, no fracture, subluxation, or dislocation. No joint effusion. IMPRESSION: Prior right knee replacement.  No acute bony abnormality. Electronically Signed   By: Rolm Baptise M.D.   On: 09/28/2018 01:44   Dg Humerus Left  Result Date: 09/28/2018 CLINICAL DATA:  Left arm pain following multiple falls EXAM: LEFT HUMERUS - 2+ VIEW COMPARISON:  None.  FINDINGS: Left shoulder replacement is noted. No acute bony abnormality is seen. Postsurgical changes in the proximal ulna are noted. No soft tissue abnormality seen. IMPRESSION: Postsurgical changes without acute abnormality. Electronically Signed   By: Inez Catalina M.D.   On: 09/28/2018 01:43   Dg Hip Unilat W Or Wo Pelvis 2-3 Views Right  Result Date: 09/28/2018 CLINICAL DATA:  Multiple falls.  Altered mental status. EXAM: DG HIP (WITH OR WITHOUT PELVIS) 2-3V RIGHT COMPARISON:  None. FINDINGS: Prior left hip replacement. No acute bony abnormality. Specifically, no fracture, subluxation, or dislocation. Postoperative changes in the visualized lower lumbar spine. IMPRESSION: No acute bony abnormality. Electronically Signed   By: Rolm Baptise M.D.   On: 09/28/2018 01:43    Pending Labs Unresulted Labs (From admission, onward)    Start     Ordered   09/28/18 0300  Urine culture  ONCE - STAT,   STAT     09/28/18 0259          Vitals/Pain Today's Vitals   09/28/18 0815 09/28/18 0826 09/28/18 0921 09/28/18 1014  BP:      Pulse: 66   69  Resp: 17   20  Temp:      TempSrc:      SpO2: 97%   95%  PainSc:  Asleep 2      Isolation Precautions No active isolations  Medications Medications  0.9 %  sodium chloride infusion ( Intravenous Rate/Dose Change 09/28/18 0233)  dextromethorphan-guaiFENesin (MUCINEX DM) 30-600 MG per 12 hr tablet 1 tablet (has no administration in time range)  albuterol (PROVENTIL) (2.5 MG/3ML) 0.083% nebulizer solution 2.5 mg (has no administration in time range)  hydrALAZINE (APRESOLINE) injection 5 mg (has no administration in time range)  heparin injection 5,000 Units (has no administration in time range)  acetaminophen (TYLENOL) tablet 650 mg (has no administration in time range)    Or  acetaminophen (TYLENOL) suppository 650 mg (has no administration in time range)  ondansetron (ZOFRAN) tablet 4 mg (has no administration in time range)    Or  ondansetron  (ZOFRAN) injection 4 mg (has no administration in time range)  ondansetron (ZOFRAN) injection 4 mg (has no administration in time range)  aspirin EC tablet 81 mg (81 mg Oral Given 09/28/18 1055)  oxyCODONE (Oxy IR/ROXICODONE) immediate release tablet 5 mg (has no administration in time range)  metoprolol tartrate (LOPRESSOR) tablet 25 mg (25 mg Oral Given 09/28/18 1055)  ALPRAZolam (XANAX)  tablet 0.5-1 mg (has no administration in time range)  buPROPion (WELLBUTRIN XL) 24 hr tablet 300 mg (300 mg Oral Given 09/28/18 1055)  FLUoxetine (PROZAC) capsule 20 mg (20 mg Oral Given 09/28/18 1055)  gabapentin (NEURONTIN) capsule 300-900 mg (has no administration in time range)  fluticasone (FLONASE) 50 MCG/ACT nasal spray 2 spray (has no administration in time range)  albuterol (PROVENTIL) (2.5 MG/3ML) 0.083% nebulizer solution 3 mL (has no administration in time range)  rosuvastatin (CRESTOR) tablet 5 mg (5 mg Oral Given 09/28/18 1054)  cholecalciferol (VITAMIN D3) tablet 5,000 Units (has no administration in time range)  polyvinyl alcohol (LIQUIFILM TEARS) 1.4 % ophthalmic solution 1 drop (has no administration in time range)  sodium chloride 0.9 % bolus 500 mL (0 mLs Intravenous Stopped 09/28/18 0229)  acetaminophen (TYLENOL) tablet 1,000 mg (1,000 mg Oral Given 09/28/18 0156)  cefTRIAXone (ROCEPHIN) 1 g in sodium chloride 0.9 % 100 mL IVPB (0 g Intravenous Stopped 09/28/18 0436)    Mobility walks with person assist High fall risk   Focused Assessments Neuro Assessment Handoff:  Swallow screen pass?  NA   NIH Stroke Scale ( + Modified Stroke Scale Criteria)  Interval: Initial Level of Consciousness (1a.)   : Alert, keenly responsive LOC Questions (1b. )   +: Answers both questions correctly LOC Commands (1c. )   + : Performs both tasks correctly Best Gaze (2. )  +: Normal Visual (3. )  +: No visual loss Facial Palsy (4. )    : Normal symmetrical movements Motor Arm, Left (5a. )   +: No drift Motor  Arm, Right (5b. )   +: Drift Motor Leg, Left (6a. )   +: No drift Motor Leg, Right (6b. )   +: Drift Limb Ataxia (7. ): Absent Sensory (8. )   +: Normal, no sensory loss Best Language (9. )   +: No aphasia Dysarthria (10. ): Normal Extinction/Inattention (11.)   +: No Abnormality Modified SS Total  +: 2 Complete NIHSS TOTAL: 2     Neuro Assessment: Exceptions to [facility] Neuro Checks:   Initial (09/28/18 0022)  Last Documented NIHSS Modified Score: 2 (09/28/18 0022) Has TPA been given? No If patient is a Neuro Trauma and patient is going to OR before floor call report to [facility] nurse: [phone] or [phone]     R Recommendations: See Admitting Provider Note  Report given to:   Additional Notes: son is with pt

## 2018-09-28 NOTE — ED Notes (Signed)
Pt's daughter, Veverly Larimer @ [phone]

## 2018-09-28 NOTE — Progress Notes (Signed)
I have seen and assessed patient and agree with Dr.Niu's assessment and plan.  Patient is a pleasant 78 year old female history of hypertension, asthma, prior history of CVA, GERD, depression and anxiety, history of DVT not on anticoagulants, anemia, chronic kidney disease stage III presented to the ED and admitted for acute metabolic encephalopathy and frequent falls.  Infectious work-up underway.  MRI of the head done to rule out acute CVA.    No charge.

## 2018-09-28 NOTE — ED Notes (Signed)
Lunch tray ordered 

## 2018-09-28 NOTE — ED Provider Notes (Signed)
TIME SEEN: 12:29 AM  CHIEF COMPLAINT: Confusion, multiple falls  HPI: Patient is an 78 year old female with history of previous stroke, asthma, fibromyalgia, paroxysmal atrial tachycardia, previous DVT not on anticoagulation but is on aspirin daily who presents to the emergency department with her daughter for concerns for intermittent confusion and multiple falls.  Daughter reports patient started falling about 3 weeks ago.  She states that it improved but then started again over the past 4 days.  She reports that she has hit her head during these falls.  She reports that today she fell and put a hole in the drywall of her house with her head.  Patient is complaining of mild headache currently.  No neck or back pain.  Daughter also reports the patient has been confused intermittently over the past 4 days.  She cannot answer questions that she normally would be able to such as her daughters age.  She started talking to her daughter about getting a tooth pulled but daughter states that this occurred months ago.  Daughter states in the waiting room the patient was trying to get her daughter cell phone out of her purse.  When daughter went to ask her why, patient seemed confused and stated that she did not know what she was doing.  No changes in medications recently.  No known fevers, cough, vomiting, diarrhea.  No exposure to COVID.  Patient is complaining of right leg pain after her falls.  Patient also has a large bruise to the left upper arm.  Daughter reports that normally she is able to ambulate without significant assistance but was unable to walk at all today.  Her primary care doctor recommended she come to the emergency department.  She has had a previous stroke but daughter does not remember what deficits she had.  Patient lives at home with her husband who has dementia.  They have caregivers from 9 AM until 10 PM.  ROS: See HPI Constitutional: no fever  Eyes: no drainage  ENT: no runny nose    Cardiovascular:  no chest pain  Resp: no SOB  GI: no vomiting GU: no dysuria Integumentary: no rash  Allergy: no hives  Musculoskeletal: no leg swelling  Neurological: no slurred speech ROS otherwise negative  PAST MEDICAL HISTORY/PAST SURGICAL HISTORY:  Past Medical History:  Diagnosis Date  . Allergy    vasomotor rhinitis  . Anemia   . Asthma    h/o asthma and bronchitis  . BV (bacterial vaginosis)    history of frequent  . Cervical spondylosis    herniated disk protruding @ C6-7, impinging cord and left axillary root sleeve.  . Chronic pain   . Closed olecranon fracture, left, initial encounter   . Depression   . DVT of upper extremity (deep vein thrombosis) ([place])    h/o left(after wrist fracture/surgery); no residual thrombus or phlebitis by Dopplers July 2014  . Dyspnea    with exertion  . Edema   . Fibromyalgia    sees Dr.Phillips-pain management  . GERD (gastroesophageal reflux disease)    h/o  . Headache    marigraine- hormal - none in years  . Heart murmur   . Hypertension    never has been treated that daughter is aware  . Neuropathy   . Neuropathy   . Osteopenia    mild at hip (T-1.3)  . PAT (paroxysmal atrial tachycardia) ([place])   . Pneumonia    years ago  . Recurrent falls 04/16/2016  . Varicose veins   .  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 Eilleen Kempf @ [facility]  . Vision problem    wears glasses  . Vitamin D deficiency     MEDICATIONS:  Prior to Admission medications   Medication Sig Start Date End Date Taking? Authorizing Provider  ALPRAZolam Duanne Moron) 0.5 MG tablet Take 0.5-1 mg by mouth 3 (three) times daily as needed for anxiety or sleep.     [provider]  aspirin EC 81 MG tablet Take 81 mg by mouth daily.    [provider]  buPROPion (WELLBUTRIN XL) 300 MG 24 hr tablet Take 300 mg by mouth daily. 04/04/18   [provider]  Carboxymethylcellulose Sodium (MOISTURIZING LUBRICANT EYE OP) Place 1 drop into both eyes as needed (for dry eyes).     [provider]  Cholecalciferol (VITAMIN D3) 5000 units CAPS Take 5,000 Units by mouth 2 (two) times daily.    [provider]  FLUoxetine (PROZAC) 20 MG capsule Take 20 mg by mouth daily.  04/13/18   [provider]  fluticasone (FLONASE) 50 MCG/ACT nasal spray Place 2 sprays into both nostrils daily as needed for allergies or rhinitis.     [provider]  gabapentin (NEURONTIN) 300 MG capsule Take 300-900 mg by mouth See admin instructions. Take 300 mg by mouth in the morning, take 300 mg by mouth in the afternoon and take 900 mg by mouth at bedtime    [provider]  ibuprofen (ADVIL,MOTRIN) 200 MG tablet Take 800 mg by mouth every 6 (six) hours as needed for headache or moderate pain.    [provider]  metoprolol tartrate (LOPRESSOR) 25 MG tablet Take 1 tablet (25 mg total) by mouth 2 (two) times daily. 03/24/16   Rosita Fire, MD  oxyCODONE (ROXICODONE) 15 MG immediate release tablet TAKE 1 TABLET FIVE TIMES A DAY AS NEEDED 03/30/18   [provider]  torsemide (DEMADEX) 10 MG tablet Take 10 mg by mouth daily as needed (for fluid).  01/13/15   [provider]    ALLERGIES:  No Known Allergies  SOCIAL HISTORY:  Social History   Tobacco Use  . Smoking status: Former Smoker    Quit date: 02/22/1958    Years since quitting: 60.6  . Smokeless tobacco: Never Used  . Tobacco comment: doesn't smoke every day  Substance Use Topics  . Alcohol use: No    Alcohol/week: 0.0 standard drinks    FAMILY HISTORY: Family History  Problem Relation Age of Onset  . Stroke Mother   . Osteoarthritis Mother        Died at age 77  . Heart disease Father   . Lung cancer Father   .  Heart failure Father        Died at age 59  . Osteoporosis Sister   . Diabetes Brother   . Heart attack Brother 43  . Diabetes Brother   . Diabetes Brother   . Heart attack Brother 64  . Breast cancer Paternal Aunt   . Heart failure Maternal Grandmother        Died at age 45, unsure of her heart disease  . Heart failure Paternal Grandfather        Died at age 71, unsure of cardiac disease.  EXAM: BP 121/69   Pulse 67   Temp 98.8 F (37.1 C) (Oral)   Resp (!) 23   SpO2 95%  CONSTITUTIONAL: Alert and oriented x3 and answers questions appropriately.  GCS 15.  Elderly.  In no distress. HEAD: Normocephalic; atraumatic EYES: Conjunctivae clear, PERRL, EOMI ENT: normal nose; no rhinorrhea; moist mucous membranes; pharynx without lesions noted; no dental injury; no septal hematoma NECK: Supple, no meningismus, no LAD; no midline spinal tenderness, step-off or deformity; trachea midline CARD: RRR; S1 and S2 appreciated; no murmurs, no clicks, no rubs, no gallops RESP: Normal chest excursion without splinting or tachypnea; breath sounds clear and equal bilaterally; no wheezes, no rhonchi, no rales; no hypoxia or respiratory distress CHEST:  chest wall stable, no crepitus or ecchymosis or deformity, nontender to palpation; no flail chest ABD/GI: Normal bowel sounds; non-distended; soft, non-tender, no rebound, no guarding; no ecchymosis or other lesions noted PELVIS:  stable, nontender to palpation BACK:  The back appears normal and is non-tender to palpation, there is no CVA tenderness; no midline spinal tenderness, step-off or deformity EXT: Large area of ecchymosis to the posterior left upper arm with some bony tenderness over the humerus without deformity.  She is tender to palpation over the right hip, right knee, right tibia and fibula, right ankle.  2+ radial and DP pulses bilaterally.  Compartments are soft.  No joint effusion or bony deformity noted.  No laceration.  Mild edema in  bilateral lower extremities that is nonpitting. SKIN: Normal color for age and race; warm NEURO: Moves all extremities equally, no drift, sensation to light touch intact diffusely, patient does have mild decreased strength in the right arm and leg compared to the left, cranial nerves II through XII intact, normal speech PSYCH: The patient's mood and manner are appropriate. Grooming and personal hygiene are appropriate.  MEDICAL DECISION MAKING: Patient here with multiple falls and confusion.  On exam she does have some left-sided weakness.  Daughter is unsure if this is new or residual from a previous stroke.  No other focal neurologic deficits noted.  She is outside TPA window given symptoms ongoing for several days.  Labs do show creatinine of 2.3.  This appears to be an uptrend over the past few months.  Will give IV hydration.  Will obtain CT of the head and cervical spine, x-rays of her extremities.  Will obtain urinalysis.  I have recommended admission and daughter agrees.  ED PROGRESS: Patient's urine shows small leuks, 6-10 white blood cells, rare bacteria and no squamous cells.  Patient's daughter reports that she has had previous symptoms similar to this when she has had a UTI.  This could be the beginning of an early UTI.  We will send urine culture and give ceftriaxone.   Head CT shows old left occipital and posterior parietal infarcts.  No acute abnormality noted.  Will obtain MRI of the brain given her right-sided weakness on exam although unclear if this is chronic or not.  X-rays and imaging otherwise show no acute abnormality.  Will discuss with medicine for admission.  3:34 AM Discussed patient's case with hospitalist, Dr. Blaine Hamper.  I have recommended admission and patient (and family if present) agree with this plan. Admitting physician will place admission orders.   I reviewed all nursing notes, vitals, pertinent previous records, EKGs, lab and urine results, imaging (as  available).    EKG Interpretation  Date/Time:  Thursday September 28 2018 00:07:49 EDT Ventricular Rate:  66 PR Interval:  QRS Duration: 101 QT Interval:  448 QTC Calculation: 470 R Axis:   28 Text Interpretation:  Sinus arrhythmia Prolonged PR interval Probable left atrial enlargement Abnormal R-wave progression, early transition Confirmed by Sincerity Cedar, Cyril Mourning [id number]) on 09/28/2018 12:49:34 AM         Chao Blazejewski, Delice Bison, DO 09/28/18 [id number]

## 2018-09-29 DIAGNOSIS — F418 Other specified anxiety disorders: Secondary | ICD-10-CM | POA: Diagnosis not present

## 2018-09-29 DIAGNOSIS — N183 Chronic kidney disease, stage 3 (moderate): Secondary | ICD-10-CM | POA: Diagnosis not present

## 2018-09-29 DIAGNOSIS — I5032 Chronic diastolic (congestive) heart failure: Secondary | ICD-10-CM | POA: Diagnosis not present

## 2018-09-29 DIAGNOSIS — N179 Acute kidney failure, unspecified: Secondary | ICD-10-CM | POA: Diagnosis not present

## 2018-09-29 DIAGNOSIS — G9341 Metabolic encephalopathy: Secondary | ICD-10-CM | POA: Diagnosis not present

## 2018-09-29 DIAGNOSIS — I471 Supraventricular tachycardia: Secondary | ICD-10-CM

## 2018-09-29 DIAGNOSIS — E860 Dehydration: Secondary | ICD-10-CM | POA: Diagnosis not present

## 2018-09-29 DIAGNOSIS — J4520 Mild intermittent asthma, uncomplicated: Secondary | ICD-10-CM | POA: Diagnosis not present

## 2018-09-29 DIAGNOSIS — I10 Essential (primary) hypertension: Secondary | ICD-10-CM | POA: Diagnosis not present

## 2018-09-29 DIAGNOSIS — R296 Repeated falls: Secondary | ICD-10-CM | POA: Diagnosis not present

## 2018-09-29 DIAGNOSIS — W19XXXD Unspecified fall, subsequent encounter: Secondary | ICD-10-CM | POA: Diagnosis not present

## 2018-09-29 DIAGNOSIS — R531 Weakness: Secondary | ICD-10-CM | POA: Diagnosis not present

## 2018-09-29 LAB — BASIC METABOLIC PANEL
Anion gap: 10 (ref 5–15)
BUN: 31 mg/dL — ABNORMAL HIGH (ref 8–23)
CO2: 24 mmol/L (ref 22–32)
Calcium: 8.9 mg/dL (ref 8.9–10.3)
Chloride: 106 mmol/L (ref 98–111)
Creatinine, Ser: 1.42 mg/dL — ABNORMAL HIGH (ref 0.44–1.00)
GFR calc Af Amer: 40 mL/min — ABNORMAL LOW (ref >60)
GFR calc non Af Amer: 35 mL/min — ABNORMAL LOW (ref >60)
Glucose, Bld: 122 mg/dL — ABNORMAL HIGH (ref 70–99)
Potassium: 3.8 mmol/L (ref 3.5–5.1)
Sodium: 140 mmol/L (ref 135–145)

## 2018-09-29 LAB — CBC
HCT: 33.9 % — ABNORMAL LOW (ref 36.0–46.0)
Hemoglobin: 10.5 g/dL — ABNORMAL LOW (ref 12.0–15.0)
MCH: 28.2 pg (ref 26.0–34.0)
MCHC: 31 g/dL (ref 30.0–36.0)
MCV: 91.1 fL (ref 80.0–100.0)
Platelets: 791 10*3/uL — ABNORMAL HIGH (ref 150–400)
RBC: 3.72 MIL/uL — ABNORMAL LOW (ref 3.87–5.11)
RDW: 13.6 % (ref 11.5–15.5)
WBC: 6.6 10*3/uL (ref 4.0–10.5)
nRBC: 0 % (ref 0.0–0.2)

## 2018-09-29 LAB — URINE CULTURE: Culture: NO GROWTH

## 2018-09-29 LAB — GLUCOSE, CAPILLARY: Glucose-Capillary: 117 mg/dL — ABNORMAL HIGH (ref 70–99)

## 2018-09-29 MED ORDER — AMLODIPINE BESYLATE 5 MG PO TABS
5.0000 mg | ORAL_TABLET | Freq: Every day | ORAL | Status: DC
  Administered 2018-09-29 – 2018-09-30 (×2): 5 mg via ORAL
  Filled 2018-09-29 (×2): qty 1

## 2018-09-29 MED ORDER — SALINE SPRAY 0.65 % NA SOLN
1.0000 | NASAL | Status: DC | PRN
  Administered 2018-09-29 (×2): 1 via NASAL
  Filled 2018-09-29: qty 44

## 2018-09-29 MED ORDER — POTASSIUM CHLORIDE CRYS ER 20 MEQ PO TBCR
20.0000 meq | EXTENDED_RELEASE_TABLET | Freq: Once | ORAL | Status: AC
  Administered 2018-09-29: 20 meq via ORAL
  Filled 2018-09-29: qty 1

## 2018-09-29 MED ORDER — POTASSIUM CHLORIDE 20 MEQ PO PACK: 40.0000 meq | PACK | Freq: Once | ORAL | Status: DC

## 2018-09-29 NOTE — Evaluation (Signed)
Physical Therapy Evaluation Patient Details Name: Shirley [name] MRN: [id number] DOB: 03-Jul-1940 Today's Date: 09/29/2018   History of Present Illness  78 yo female with onset of AMS and many falls at home has acute metabolic encephalopathy, FTT and is generally weak.  Covid (-).  Has caregivers at home but not at night.  PMHx:  remote CVA's, lumbar surgery, R TKA, L Total shoulder, cervical spondylosis, asthma, DVT, L THA, PAT, PNA, L elbow fracture, PN    Clinical Impression  Pt was seen for mobility and noted her lateral instability and could not do a transfer to [facility] without having listing to R.  Pt is typically home with caregivers during the day, and will need to have overnight help to safely transfer to home.  Since she currently does not, primary plan is for SNF but can default to home when this is arranged.  Pt has sustained many falls with the caregivers not being there, although has with them there as well.  Continue acute therapy to progress her to more independent gait on regular RW, and will not recommend a rollator unless pt is able to demonstrate safer gait with one.    Follow Up Recommendations Home health PT;Supervision for mobility/OOB;Supervision/Assistance - 24 hour;SNF    Equipment Recommendations  Rolling walker with 5" wheels    Recommendations for Other Services       Precautions / Restrictions Precautions Precautions: Fall Precaution Comments: monitor pulses and Sats Restrictions Weight Bearing Restrictions: No      Mobility  Bed Mobility Overal bed mobility: Needs Assistance Bed Mobility: Supine to Sit;Sit to Supine     Supine to sit: Min assist Sit to supine: Min assist      Transfers Overall transfer level: Needs assistance Equipment used: Rolling walker (2 wheeled);1 person hand held assist Transfers: Sit to/from [facility] Sit to Stand: Min assist Stand pivot transfers: Min assist       General transfer comment: min assist to  steady, to pivot to [facility]  Ambulation/Gait Ambulation/Gait assistance: Min guard Gait Distance (Feet): 40 [id number]) Assistive device: Rolling walker (2 wheeled);1 person hand held assist Gait Pattern/deviations: Step-through pattern;Decreased stride length;Wide base of support;Drifts right/left;Trunk flexed Gait velocity: reduced Gait velocity interpretation: <1.8 ft/sec, indicate of risk for recurrent falls General Gait Details: pt is able to walk with RW but cannot safely go alone given her mild lateral instability  Stairs Stairs: (deferred)          Wheelchair Mobility    Modified Rankin (Stroke Patients Only)       Balance Overall balance assessment: History of Falls;Needs assistance Sitting-balance support: Feet supported Sitting balance-Leahy Scale: Fair(if she lifts up legs, lists backward on bed)     Standing balance support: Bilateral upper extremity supported;During functional activity Standing balance-Leahy Scale: Poor                               Pertinent Vitals/Pain Pain Assessment: Faces Faces Pain Scale: No hurt    Home Living Family/patient expects to be discharged to:: Private residence Living Arrangements: Spouse/significant other Available Help at Discharge: Personal care attendant;Available PRN/intermittently;Family;Available 24 hours/day(husband is there with dementia, caregivers daytime only) Type of Home: House Home Access: Stairs to enter   [employer] of Steps: 1+1 Home Layout: One level Home Equipment: [place] - 4 wheels Additional Comments: husband is not requiring help but cannot stay alone and pt is falling often  Prior Function Level of Independence: Independent with assistive device(s)(but has been falling)               Hand Dominance   Dominant Hand: Right    Extremity/Trunk Assessment   Upper Extremity Assessment Upper Extremity Assessment: Generalized weakness    Lower Extremity  Assessment Lower Extremity Assessment: Generalized weakness    Cervical / Trunk Assessment Cervical / Trunk Assessment: Kyphotic  Communication   Communication: No difficulties  Cognition Arousal/Alertness: Awake/alert Behavior During Therapy: WFL for tasks assessed/performed Overall Cognitive Status: No family/caregiver present to determine baseline cognitive functioning                                 General Comments: pt is aware she has fallen many times but is not worried about this      General Comments General comments (skin integrity, edema, etc.): pt is up to walk on RW first side of bed then with walker in the room with wide slow turns, needs assist to control minor losses of balance    Exercises     Assessment/Plan    PT Assessment Patient needs continued PT services  PT Problem List Decreased strength;Decreased range of motion;Decreased activity tolerance;Decreased mobility;Decreased balance;Decreased coordination;Decreased knowledge of use of DME;Decreased safety awareness;Cardiopulmonary status limiting activity       PT Treatment Interventions DME instruction;Gait training;Stair training;Functional mobility training;Therapeutic activities;Balance training;Therapeutic exercise;Neuromuscular re-education;Patient/family education    PT Goals (Current goals can be found in the Care Plan section)  Acute Rehab PT Goals Patient Stated Goal: to get on rollator and go home PT Goal Formulation: With patient Time For Goal Achievement: 10/13/18 Potential to Achieve Goals: Good    Frequency Min 3X/week   Barriers to discharge Decreased caregiver support has husband with dementia, caregivers are in the day only    Co-evaluation               AM-PAC PT "6 Clicks" Mobility  Outcome Measure Help needed turning from your back to your side while in a flat bed without using bedrails?: A Little Help needed moving from lying on your back to sitting on the  side of a flat bed without using bedrails?: A Little Help needed moving to and from a bed to a chair (including a wheelchair)?: A Little Help needed standing up from a chair using your arms (e.g., wheelchair or bedside chair)?: A Little Help needed to walk in hospital room?: A Little Help needed climbing 3-5 steps with a railing? : A Lot 6 Click Score: 17    End of Session Equipment Utilized During Treatment: Gait belt Activity Tolerance: Patient limited by fatigue;Treatment limited secondary to medical complications (Comment) Patient left: in bed;with call bell/phone within reach;with bed alarm set Nurse Communication: Mobility status PT Visit Diagnosis: Unsteadiness on feet (R26.81);Muscle weakness (generalized) (M62.81);History of falling (Z91.81)    Time: 6294-7654 PT Time Calculation (min) (ACUTE ONLY): 39 min   Charges:   PT Evaluation $PT Eval Moderate Complexity: 1 Mod PT Treatments $Gait Training: 8-22 mins $Therapeutic Exercise: 8-22 mins       Ramond Dial 09/29/2018, 4:17 PM   Mee Hives, PT MS Acute Rehab Dept. Number: [place] and [place]

## 2018-09-29 NOTE — TOC Initial Note (Signed)
Transition of Care Shirley [name]) - Initial/Assessment Note    Patient Details  Name: Shirley [name] MRN: [id number] Date of Birth: 09-07-1940  Transition of Care Shirley [name]) CM/SW Contact:    Shirley [name] Advanced Care Supervisor Phone Number: [phone] 09/29/2018, 3:02 PM  Clinical Narrative:                 Patient is confused at present; TCT daughter Shirley [name]. Shirley [name] stated that patient lives with her spouse who has Dementia; both patient and the spouse have 2  private caregiver providing support from 9 am - 9 pm; she was also making arrangements for assistance during the night due to patient falling at night; she is active with [name] as prior to admission for HHPT; she is also requesting a HHRN at discharge; DME - walker at home; CM/SW will continue to follow for progression of care.  Expected Discharge Plan: [place] Barriers to Discharge: No Barriers Identified   Patient Goals and CMS Choice Patient states their goals for this hospitalization and ongoing recovery are:: pt confused at present; Daughter wants patient to get stronger   Choice offered to / list presented to : NA  Expected Discharge Plan and Services Expected Discharge Plan: [place]   Discharge Planning Services: CM Consult Post Acute Care Choice: [place] arrangements for the past 2 months: Single Family Home                           HH Arranged: RN, PT [facility] Agency: [place] Date [place]: 09/29/18 Time [place]: 1459 Representative spoke with at [place]: [place] Arrangements/Services Living arrangements for the past 2 months: [place] with:: Spouse, Other (Comment)(Caregivers) Patient language and need for interpreter reviewed:: Yes        Need for Family Participation in Patient Care: Yes (Comment) Care giver support system in place?: Yes (comment) Current home  services: Home PT Criminal Activity/Legal Involvement Pertinent to Current Situation/Hospitalization: No - Comment as needed  Activities of Daily Living  Private caregivers in the home to assist with ADL's    Permission Sought/Granted Permission sought to share information with : Case Manager Permission granted to share information with : Yes, Verbal Permission Granted     Permission granted to share info w AGENCY: [employer] agency        Emotional Assessment Appearance:: Appears stated age Attitude/Demeanor/Rapport: Other (comment)(confused) Affect (typically observed): Other (comment)(confused)   Alcohol / Substance Use: Not Applicable Psych Involvement: No (comment)  Admission diagnosis:  Confusion [R41.0] Fall [W19.XXXA] Acute cystitis without hematuria [N30.00] Frequent falls [R29.6] Right sided weakness [R53.1] Acute renal failure, unspecified acute renal failure type [name]) [N17.9] Patient Active Problem List   Diagnosis Date Noted  . CKD (chronic kidney disease), stage III ([place]) 09/28/2018  . Stroke ([place]) 09/28/2018  . Depression with anxiety 09/28/2018  . Chronic diastolic CHF (congestive heart failure) ([place]) 09/28/2018  . Right sided weakness 09/28/2018  . Asthma   . Frequent falls   . 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 OA (osteoarthritis) of hip 08/18/2016  . Pre-operative cardiovascular examination 07/16/2016  . Recurrent falls 04/16/2016  . Chronic pain   . Hypertension   . Neuropathy   . Osteopenia   . Vitamin D deficiency   . Olecranon fracture 04/15/2016  . Fall at home, initial encounter   . Fall   .  Anemia   . Post-operative pain   . Fibromyalgia   . Uncomplicated asthma   . Tobacco abuse   . AKI (acute kidney injury) ([place])   . Leukocytosis   . Other secondary hypertension   . Hyperglycemia   . Acute blood loss anemia   . Thrombocytosis ([place])   . Closed fracture of left olecranon process   . Acute renal failure ([place]) 03/18/2016  .  Acute metabolic encephalopathy 23/55/7322  . Elbow fracture, left, closed, initial encounter 03/18/2016  . Acute pain of left hip 12/23/2015  . UTI (urinary tract infection) 12/23/2015  . DNR (do not resuscitate) discussion 12/23/2015  . Palliative care by specialist 12/23/2015  . Chronic pain syndrome 12/23/2015  . Left hip pain 12/23/2015  . Effusion of left hip   . Lytic bone lesions on xray   . Exertional dyspnea 09/07/2012  . Varicose veins of both lower extremities with pain 08/27/2012  . Lower extremity edema 08/27/2012  . Abnormal resting ECG findings - sinus rhythm with PACs, and PAT 08/19/2012  . Aortic systolic murmur on examination 08/19/2012  . PAT (paroxysmal atrial tachycardia) / MAT 08/19/2012   PCP:  Shirley Dark, PA-C Pharmacy:   [id number] - [place], [address] [place] [address] Phone: [phone] Fax: [phone]     Social Determinants of Health (SDOH) Interventions    Readmission Risk Interventions No flowsheet data found.

## 2018-09-29 NOTE — Progress Notes (Signed)
PROGRESS NOTE    DRAKE LANDING  RCB:[id number] DOB: 05/12/1940 DOA: 09/27/2018 PCP: Ardith Dark, PA-C    Brief Narrative:  HPI per Dr. Merceda Elks is a 78 y.o. 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  Per her daughter (I called her daughter by phone), patient has been intermittently confused since Saturday.  She has generalized weakness and failure to thrive.  She has been having frequent fall, at least 6-7 times.  She has head injury and bruising to her left arm.  She has pain in right knee. On examination, patient seems to have right-sided weakness.  No facial droop or slurred speech.  Per her daughter, patient does not have chest pain, shortness of breath or cough.  She has mild wheezing sometimes.  No nausea, vomiting, diarrhea, abdominal pain.  No symptoms of UTI.  ED Course: pt was found to have WBC 6.9, negative COVID-19 test, urinalysis (clear appearance, small amount of leukocyte, rare bacteria, WBC 6-10), slightly worsening renal function, temperature normal, blood pressure 121/66, heart rate 65, oxygen saturation 92-100% on room air.  Chest x-ray negative.  CT head is negative for acute intracranial abnormalities, but showed old left occipital and parietal infarct.  CT of C-spine is negative for bony fracture.  X-ray of her right ankle, left humerus, right knee, pelvis and right hip are negative for bony fracture.  Patient is placed on telemetry bed for observation  Assessment & Plan:   Principal Problem:   Fall Active Problems:   PAT (paroxysmal atrial tachycardia) / MAT   Acute metabolic encephalopathy   Hypertension   CKD (chronic kidney disease), stage III (HCC)   Stroke ([place])   Depression with anxiety   Chronic diastolic CHF (congestive heart failure) ([place])  Right sided weakness   Asthma  1 fall Patient noted to have frequent falls and generalized weakness.  Likely secondary to dehydration in the setting of diuretics.  Urinalysis done showed small amount of leukocytes, rare bacteria, 6-10 WBCs.  Urine cultures negative.  Patient with no signs or symptoms of infection.  Chest x-ray negative for any acute infiltrate.  Patient received a dose of IV Rocephin in the ED and a dose this morning.  DC IV antibiotics.  MRI of the head negative for acute CVA.  Patient seen by PT OT who are recommending 24-hour supervision with home health therapies versus skilled nursing facility.  2.  Acute metabolic encephalopathy Likely secondary to dehydration and volume depletion as patient noted to have systolic blood pressures in the 90s on admission with probable underlying dementia.  Urinalysis with urine culture is negative.  DC IV antibiotics.  Chest x-ray negative for any acute infiltrate.  Patient afebrile.  MRI of the head negative for acute CVA.  Patient improved clinically on hydration and currently alert to self place and time.  Will need to confirm with the family about baseline.  Continue supportive care.  Will follow.  3.  Paroxysmal atrial tachycardia/MAT Continue metoprolol with holding parameters.  4.  Hypertension Continue metoprolol with holding parameters of systolic blood pressure less than 95.  Saline lock IV fluids.  Resume home regimen Norvasc.  Follow.  5.  Depression/anxiety Stable.  Continue Prozac, Neurontin, Wellbutrin XL.  6.  Chronic kidney disease stage III Patient noted to have a slight worsening of renal function with  recent baseline creatinine of 2.1 08/22/2018.  Patient's creatinine was 1.1 on 12/28/2016.  On day of admission creatinine was up to 2.30 felt likely secondary to prerenal azotemia in the setting of diuretics and NSAIDs.  Diuretics have been held.  Patient placed on IV fluids which will saline lock.  Ibuprofen on hold.  Renal  function trending down and creatinine currently at 1.42.  Follow.  7.  History of CVA Continue aspirin for secondary stroke prophylaxis.  8.  Chronic diastolic CHF 2D echo from 03/18/2016 with a EF of 65 to 70% with grade 1 diastolic dysfunction.  Patient noted to have some trace lower extremity edema on exam however no edema today.  No JVD.  Chest x-ray negative for any pulmonary edema.  Patient denies any shortness of breath.  Currently compensated.  Saline lock IV fluids.  Continue to hold torsemide for now due to worsening renal function.  Follow.  9.  Right-sided weakness MRI brain negative for acute CVA.  PT/OT.  10.  Asthma Stable.  Nebs as needed.  Mucinex as needed.   DVT prophylaxis: SCDs Code Status: DNR Family Communication: Updated daughter via telephone. Disposition Plan: Likely home with home health versus skilled nursing facility hopefully tomorrow.   Consultants:   None  Procedures:   CT head CT C-spine 09/28/2018  MRI brain 09/28/2018  Chest x-ray 09/28/2018, plain films of the left humerus 09/28/2018  Plain films of right knee 09/28/2018  Plain films of the right ankle 09/28/2018  Antimicrobials:   IV Rocephin 09/28/2018>>>> 09/29/2018   Subjective: Patient sleeping however easily arousable.  Patient alert oriented to self place and time.  Patient feeling much better.  Denies any chest pain or shortness of breath.  Awaiting PT evaluation this morning.  Objective: Vitals:   09/28/18 2201 09/29/18 0554 09/29/18 0854 09/29/18 1156  BP: 135/81 (!) 178/69 (!) 172/75 (!) 172/81  Pulse: 74 70 76 78  Resp: 18 18  16   Temp: 98.3 F (36.8 C) 98.5 F (36.9 C)  (!) 97.4 F (36.3 C)  TempSrc: Oral Oral  Oral  SpO2: 95% 96%  94%  Height:        Intake/Output Summary (Last 24 hours) at 09/29/2018 1658 Last data filed at 09/29/2018 1514 Gross per 24 hour  Intake 1229 ml  Output 600 ml  Net 629 ml   There were no vitals filed for this visit.  Examination:  General  exam: Appears calm and comfortable  Respiratory system: Clear to auscultation. Respiratory effort normal. Cardiovascular system: S1 & S2 heard, RRR. No JVD, murmurs, rubs, gallops or clicks. No pedal edema. Gastrointestinal system: Abdomen is nondistended, soft and nontender. No organomegaly or masses felt. Normal bowel sounds heard. Central nervous system: Alert and oriented. No focal neurological deficits. Extremities: Symmetric 5 x 5 power. Skin: No rashes, lesions or ulcers Psychiatry: Judgement and insight appear normal. Mood & affect appropriate.     Data Reviewed: I have personally reviewed following labs and imaging studies  CBC: Recent Labs  Lab 09/27/18 2145 09/28/18 0938 09/29/18 0419  WBC 6.9 5.5 6.6  NEUTROABS 4.5  --   --   HGB 11.5* 10.1* 10.5*  HCT 37.7 33.7* 33.9*  MCV 93.8 94.1 91.1  PLT 893* 682* 179*   Basic Metabolic Panel: Recent Labs  Lab 09/27/18 2145 09/28/18 0938 09/29/18 0419  NA 136 140 140  K 4.6 4.5 3.8  CL 98 104 106  CO2 24 25 24   GLUCOSE 137* 109* 122*  BUN  59* 49* 31*  CREATININE 2.30* 1.98* 1.42*  CALCIUM 9.3 8.8* 8.9   GFR: CrCl cannot be calculated (Unknown ideal weight.). Liver Function Tests: Recent Labs  Lab 09/27/18 2145  AST 21  ALT 15  ALKPHOS 117  BILITOT 0.6  PROT 7.3  ALBUMIN 4.0   No results for input(s): LIPASE, AMYLASE in the last 168 hours. No results for input(s): AMMONIA in the last 168 hours. Coagulation Profile: No results for input(s): INR, PROTIME in the last 168 hours. Cardiac Enzymes: No results for input(s): CKTOTAL, CKMB, CKMBINDEX, TROPONINI in the last 168 hours. BNP (last 3 results) No results for input(s): PROBNP in the last 8760 hours. HbA1C: No results for input(s): HGBA1C in the last 72 hours. CBG: Recent Labs  Lab 09/29/18 0839  GLUCAP 117*   Lipid Profile: No results for input(s): CHOL, HDL, LDLCALC, TRIG, CHOLHDL, LDLDIRECT in the last 72 hours. Thyroid Function Tests: Recent  Labs    09/28/18 0938  TSH 2.125   Anemia Panel: No results for input(s): VITAMINB12, FOLATE, FERRITIN, TIBC, IRON, RETICCTPCT in the last 72 hours. Sepsis Labs: No results for input(s): PROCALCITON, LATICACIDVEN in the last 168 hours.  Recent Results (from the past 240 hour(s))  SARS Coronavirus 2 [facility] order, Performed in [place] hospital lab)     Status: None   Collection Time: 09/28/18 12:54 AM  Result Value Ref Range Status   SARS Coronavirus 2 NEGATIVE NEGATIVE Final    Comment: (NOTE) SARS CoV 2 target nucleic acids are NOT DETECTED. The SARS CoV 2 RNA is generally detectable in upper and lower respiratory specimens during the acute phase of infection. The lowest concentration of SARS CoV 2 viral copies this assay can detect is 250 copies per mL. A negative result does not preclude SARS CoV 2 infection and should not be used as the sole basis for treatment or other patient management decisions. A negative result may occur with improper specimen collection and handling, submission  of specimen other than nasopharyngeal swab, presence of viral mutation(s) within the areas targeted by this assay, and inadequate number of viral copies (less than 250 copies per mL). A negative result must be combined with clinical observations, patient history,  and epidemiological information. The expected result is Negative. Fact Sheet for Patients:   https [employer] 983382 download Fact Sheet for Healthcare Providers:   https [employer] 630-004-0997 d Jenel Lucks This test is not yet approved or cleared by the [place] FDA and  has been authorized for detection and/or diagnosis of SARS CoV 2 by FDA under an Emergency Use Authorization (EUA).  This EUA will remain in effect (meaning this test can be used) for the duration of  the COVID19 declaration under Section 564(b)(1) of the Act, 21 U.S.C.  section 360bbb-3(b)(1), unless the authorization is terminated or revoked  sooner. Performed at [facility], [place] [address], [place], [place] [address]   Urine culture     Status: None   Collection Time: 09/28/18  4:07 AM   Specimen: Urine, Clean Catch  Result Value Ref Range Status   Specimen Description URINE, CLEAN CATCH  Final   Special Requests NONE  Final   Culture   Final    NO GROWTH Performed at [facility], [place] [address], [place], [place] [address]    Report Status 09/29/2018 FINAL  Final         Radiology Studies: Dg Chest 1 View  Result Date: 09/28/2018 CLINICAL DATA:  Weakness and failure to thrive EXAM: CHEST  1 VIEW COMPARISON:  07/31/2018 FINDINGS: Cardiac shadow is mildly enlarged but stable. The lungs are clear bilaterally. Postsurgical changes are noted. No acute bony abnormality is seen. IMPRESSION: No acute abnormality noted. Electronically Signed   By: Inez Catalina M.D.   On: 09/28/2018 01:43   Dg Ankle Complete Right  Result Date: 09/28/2018 CLINICAL DATA:  Recent falls with right ankle pain, initial encounter EXAM: RIGHT ANKLE - COMPLETE 3+ VIEW COMPARISON:  None. FINDINGS: Postsurgical changes are noted in the first metatarsal bone. No acute fracture is identified. No soft tissue abnormality is seen. IMPRESSION: Chronic changes without acute abnormality. Electronically Signed   By: Inez Catalina M.D.   On: 09/28/2018 01:45   Ct Head Wo Contrast  Result Date: 09/28/2018 CLINICAL DATA:  Multiple falls.  Altered mental status EXAM: CT HEAD WITHOUT CONTRAST CT CERVICAL SPINE WITHOUT CONTRAST TECHNIQUE: Multidetector CT imaging of the head and cervical spine was performed following the standard protocol without intravenous contrast. Multiplanar CT image reconstructions of the cervical spine were also generated. 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 Extensive chronic small vessel disease throughout the deep white matter. Mild cerebral atrophy. No  acute intracranial abnormality. Specifically, no hemorrhage, hydrocephalus, mass lesion, acute infarction, or significant intracranial injury. Vascular: No hyperdense vessel or unexpected calcification. Skull: No acute calvarial abnormality. Sinuses/Orbits: Visualized paranasal sinuses and mastoids clear. Orbital soft tissues unremarkable. Other: None CT CERVICAL SPINE FINDINGS Alignment: 4 mm of anterolisthesis of C3 on C4 related to facet disease. Skull base and vertebrae: No acute fracture. No primary bone lesion or focal pathologic process. Soft tissues and spinal canal: No prevertebral fluid or swelling. No visible canal hematoma. Disc levels:  Advanced diffuse degenerative disc and facet disease. 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 No acute findings. 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  Cerebral atrophy. No acute intracranial abnormality. Advanced degenerative changes.  No acute bony abnormality. Electronically Signed   By: Rolm Baptise M.D.   On: 09/28/2018 01:47   Ct Cervical Spine Wo Contrast  Result Date: 09/28/2018 CLINICAL DATA:  Multiple falls.  Altered mental status EXAM: CT HEAD WITHOUT CONTRAST CT CERVICAL SPINE WITHOUT CONTRAST TECHNIQUE: Multidetector CT imaging of the head and cervical spine was performed following the standard protocol without intravenous contrast. Multiplanar CT image reconstructions of the cervical spine were also generated. 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 Extensive chronic small vessel disease throughout the deep white matter. Mild cerebral atrophy. No acute intracranial abnormality. Specifically, no hemorrhage, hydrocephalus, mass lesion, acute infarction, or significant intracranial injury. Vascular: No hyperdense vessel or unexpected calcification. Skull: No acute  calvarial abnormality. Sinuses/Orbits: Visualized paranasal sinuses and mastoids clear. Orbital soft tissues unremarkable. Other: None CT CERVICAL SPINE FINDINGS Alignment: 4 mm of anterolisthesis of C3 on C4 related to facet disease. Skull base and vertebrae: No acute fracture. No primary bone lesion or focal pathologic process. Soft tissues and spinal canal: No prevertebral fluid or swelling. No visible canal hematoma. Disc levels:  Advanced diffuse degenerative disc and facet disease. 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 No acute findings. 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  Cerebral atrophy. No acute intracranial abnormality. Advanced degenerative changes.  No acute bony abnormality.  Electronically Signed   By: Rolm Baptise M.D.   On: 09/28/2018 01:47   Mr Brain Wo Contrast  Result Date: 09/28/2018 CLINICAL DATA:  Right-sided weakness EXAM: MRI HEAD WITHOUT CONTRAST TECHNIQUE: Multiplanar, multiecho pulse sequences of the brain and surrounding structures were obtained without intravenous contrast. COMPARISON:  Head CT from earlier today FINDINGS: Brain: No acute infarction, hemorrhage, hydrocephalus, extra-axial collection or mass lesion. Two small to moderate remote left occipital infarcts. Small remote high left parietal cortex infarct. Confluent FLAIR hyperintensity throughout the cerebral white matter. Mild small vessel ischemic type change in the pons. Brain volume is normal. Vascular: Major flow voids are preserved Skull and upper cervical spine: Negative for marrow lesion. Cervical spine degeneration with retro dental ligament thickening and C3-4 anterolisthesis. Sinuses/Orbits: Mild right mastoid opacification. Negative sinuses and orbits. IMPRESSION: 1. No acute finding. 2. Extensive chronic ischemic injury as described. Electronically Signed   By: Monte Fantasia M.D.   On:  09/28/2018 06:54   Dg Knee Complete 4 Views Right  Result Date: 09/28/2018 CLINICAL DATA:  Multiple falls. EXAM: RIGHT KNEE - COMPLETE 4+ VIEW COMPARISON:  None. FINDINGS: Prior right knee replacement. No hardware complicating feature. No acute bony abnormality. Specifically, no fracture, subluxation, or dislocation. No joint effusion. IMPRESSION: Prior right knee replacement.  No acute bony abnormality. Electronically Signed   By: Rolm Baptise M.D.   On: 09/28/2018 01:44   Dg Humerus Left  Result Date: 09/28/2018 CLINICAL DATA:  Left arm pain following multiple falls EXAM: LEFT HUMERUS - 2+ VIEW COMPARISON:  None. FINDINGS: Left shoulder replacement is noted. No acute bony abnormality is seen. Postsurgical changes in the proximal ulna are noted. No soft tissue abnormality seen. IMPRESSION: Postsurgical changes without acute abnormality. Electronically Signed   By: Inez Catalina M.D.   On: 09/28/2018 01:43   Dg Hip Unilat W Or Wo Pelvis 2-3 Views Right  Result Date: 09/28/2018 CLINICAL DATA:  Multiple falls.  Altered mental status. EXAM: DG HIP (WITH OR WITHOUT PELVIS) 2-3V RIGHT COMPARISON:  None. FINDINGS: Prior left hip replacement. No acute bony abnormality. Specifically, no fracture, subluxation, or dislocation. Postoperative changes in the visualized lower lumbar spine. IMPRESSION: No acute bony abnormality. Electronically Signed   By: Rolm Baptise M.D.   On: 09/28/2018 01:43        Scheduled Meds: . amLODipine  5 mg Oral Daily  . aspirin EC  81 mg Oral Daily  . buPROPion  300 mg Oral Daily  . cholecalciferol  5,000 Units Oral Daily  . FLUoxetine  20 mg Oral Daily  . gabapentin  300 mg Oral BID  . gabapentin  900 mg Oral QHS  . heparin  5,000 Units Subcutaneous Q8H  . metoprolol tartrate  25 mg Oral BID  . rosuvastatin  5 mg Oral Daily   Continuous Infusions: . cefTRIAXone (ROCEPHIN)  IV 1 g (09/29/18 0615)     LOS: 0 days    Time spent: 35 minutes    Irine Seal, MD  [facility]  If 7PM-7AM, please contact night-coverage [email or web address] 09/29/2018, 4:58 PM

## 2018-09-29 NOTE — Evaluation (Signed)
OT Cancellation Note  Patient Details Name: ALYSSIA HEESE MRN: [id number] DOB: 1940-06-22   Cancelled Treatment:    Reason Eval/Treat Not Completed: Patient not medically ready Pt with low resting HR out of therapeutic parameters, will continue to follow next date as available and appropriate to initiate OT POC.  Zenovia Jarred, MSOT, OTR/L Behavioral Health OT/ Acute Relief OT [facility] Office: [place] 09/29/2018, 3:13 PM

## 2018-09-29 NOTE — Care Management Obs Status (Signed)
[place] NOTIFICATION   Patient Details  Name: Shirley [name] MRN: [id number] Date of Birth: [birth year]   Medicare Observation Status Notification Given:  (P) Yes(pt confused. CM talked to [facility]- daughter; Letter explained in detail, all questions answered)    Royston Bake, RN 09/29/2018, 2:55 PM

## 2018-09-29 NOTE — Progress Notes (Addendum)
PT Cancellation Note  Patient Details Name: Shirley [name] MRN: [id number] DOB: 23-Apr-1940   Cancelled Treatment:    Reason Eval/Treat Not Completed: Patient not medically ready.  Resting pulse 37, will reattempt at another time.   Pt reports she is home with husband who has caregivers, using rollator but RW also there.     Ramond Dial 09/29/2018, 10:45 AM   Mee Hives, PT MS Acute Rehab Dept. Number: [place] and [place]

## 2018-09-30 DIAGNOSIS — J4520 Mild intermittent asthma, uncomplicated: Secondary | ICD-10-CM | POA: Diagnosis not present

## 2018-09-30 DIAGNOSIS — I471 Supraventricular tachycardia: Secondary | ICD-10-CM | POA: Diagnosis not present

## 2018-09-30 DIAGNOSIS — I10 Essential (primary) hypertension: Secondary | ICD-10-CM | POA: Diagnosis not present

## 2018-09-30 DIAGNOSIS — R531 Weakness: Secondary | ICD-10-CM | POA: Diagnosis not present

## 2018-09-30 DIAGNOSIS — W19XXXD Unspecified fall, subsequent encounter: Secondary | ICD-10-CM | POA: Diagnosis not present

## 2018-09-30 DIAGNOSIS — R296 Repeated falls: Secondary | ICD-10-CM | POA: Diagnosis not present

## 2018-09-30 DIAGNOSIS — N179 Acute kidney failure, unspecified: Secondary | ICD-10-CM | POA: Diagnosis not present

## 2018-09-30 DIAGNOSIS — F418 Other specified anxiety disorders: Secondary | ICD-10-CM | POA: Diagnosis not present

## 2018-09-30 DIAGNOSIS — N183 Chronic kidney disease, stage 3 (moderate): Secondary | ICD-10-CM | POA: Diagnosis not present

## 2018-09-30 DIAGNOSIS — E860 Dehydration: Secondary | ICD-10-CM

## 2018-09-30 DIAGNOSIS — I5032 Chronic diastolic (congestive) heart failure: Secondary | ICD-10-CM | POA: Diagnosis not present

## 2018-09-30 DIAGNOSIS — G9341 Metabolic encephalopathy: Secondary | ICD-10-CM | POA: Diagnosis not present

## 2018-09-30 LAB — BASIC METABOLIC PANEL
Anion gap: 13 (ref 5–15)
BUN: 19 mg/dL (ref 8–23)
CO2: 21 mmol/L — ABNORMAL LOW (ref 22–32)
Calcium: 9.1 mg/dL (ref 8.9–10.3)
Chloride: 107 mmol/L (ref 98–111)
Creatinine, Ser: 1.23 mg/dL — ABNORMAL HIGH (ref 0.44–1.00)
GFR calc Af Amer: 48 mL/min — ABNORMAL LOW (ref >60)
GFR calc non Af Amer: 41 mL/min — ABNORMAL LOW (ref >60)
Glucose, Bld: 106 mg/dL — ABNORMAL HIGH (ref 70–99)
Potassium: 3.9 mmol/L (ref 3.5–5.1)
Sodium: 141 mmol/L (ref 135–145)

## 2018-09-30 LAB — HEMOGLOBIN AND HEMATOCRIT, BLOOD
HCT: 36.9 % (ref 36.0–46.0)
Hemoglobin: 11.5 g/dL — ABNORMAL LOW (ref 12.0–15.0)

## 2018-09-30 LAB — GLUCOSE, CAPILLARY: Glucose-Capillary: 105 mg/dL — ABNORMAL HIGH (ref 70–99)

## 2018-09-30 MED ORDER — TORSEMIDE 10 MG PO TABS: 10.0000 mg | ORAL_TABLET | ORAL | Status: DC | PRN

## 2018-09-30 MED ORDER — ACETAMINOPHEN 325 MG PO TABS: 650.0000 mg | ORAL_TABLET | Freq: Four times a day (QID) | ORAL | Status: AC | PRN

## 2018-09-30 NOTE — Evaluation (Signed)
Occupational Therapy Evaluation Patient Details Name: Shirley [name] MRN: [id number] DOB: 1940-04-01 Today's Date: 09/30/2018    History of Present Illness 78 yo female with onset of AMS and many falls at home has acute metabolic encephalopathy, FTT and is generally weak.  Covid (-).  Has caregivers at home but not at night.  PMHx:  remote CVA's, lumbar surgery, R TKA, L Total shoulder, cervical spondylosis, asthma, DVT, L THA, PAT, PNA, L elbow fracture, PN   Clinical Impression   Patient evaluated by Occupational Therapy with no further acute OT needs identified. All education has been completed and the patient has no further questions. Pt is close to, or back to baseline.  Recommend Life alert for night time.  See below for any follow-up Occupational Therapy or equipment needs. OT is signing off. Thank you for this referral.     Follow Up Recommendations  No OT follow up;Supervision - Intermittent    Equipment Recommendations  None recommended by OT    Recommendations for Other Services       Precautions / Restrictions Precautions Precautions: Fall      Mobility Bed Mobility Overal bed mobility: Modified Independent       Supine to sit: Modified independent (Device/Increase time)        Transfers Overall transfer level: Modified independent Equipment used: Rolling walker (2 wheeled) Transfers: Sit to/from Stand Sit to Stand: Modified independent (Device/Increase time)              Balance Overall balance assessment: History of Falls;Needs assistance Sitting-balance support: No upper extremity supported;Feet supported Sitting balance-Leahy Scale: Good     Standing balance support: No upper extremity supported Standing balance-Leahy Scale: Fair Standing balance comment: able to statically stand  > 10 mins without LOB                            ADL either performed or assessed with clinical judgement   ADL Overall ADL's : At baseline                                       General ADL Comments: Pt reports she feels she is at baseline.  She reports that she bathed herself today.  She requires assist for socks and shoes, and is not interested in AE.  Caregivers place [facility] next to her bed a night. Reinforced recommendation for life alert      Vision Baseline Vision/History: Wears glasses Wears Glasses: At all times Patient Visual Report: No change from baseline       Perception     Praxis      Pertinent Vitals/Pain Pain Assessment: No/denies pain     Hand Dominance Right   Extremity/Trunk Assessment Upper Extremity Assessment Upper Extremity Assessment: Generalized weakness   Lower Extremity Assessment Lower Extremity Assessment: Generalized weakness   Cervical / Trunk Assessment Cervical / Trunk Assessment: Kyphotic   Communication Communication Communication: No difficulties   Cognition Arousal/Alertness: Awake/alert Behavior During Therapy: WFL for tasks assessed/performed Overall Cognitive Status: Within Functional Limits for tasks assessed                                     General Comments  no loss of balance during gait; reliant on support for gait    Exercises  Shoulder Instructions      Home Living Family/patient expects to be discharged to:: Private residence Living Arrangements: Spouse/significant other Available Help at Discharge: Personal care attendant;Available PRN/intermittently;Family;Available 24 hours/day Type of Home: House Home Access: Stairs to enter [employer] of Steps: 1+1 Entrance Stairs-Rails: None Home Layout: One level     Bathroom Shower/Tub: Occupational psychologist: Standard     Home Equipment: Environmental consultant - 4 wheels;Shower seat;Bedside commode;Grab bars - tub/shower   Additional Comments: Pt has daytime caregivers until 9 p      Prior Functioning/Environment Level of Independence: Needs assistance  Gait /  Transfers Assistance Needed: uses rollator  ADL's / Homemaking Assistance Needed: Pt reports spouse assists with shocks and shoes             OT Problem List: Decreased activity tolerance      OT Treatment/Interventions:      OT Goals(Current goals can be found in the care plan section) Acute Rehab OT Goals Patient Stated Goal: to go home OT Goal Formulation: All assessment and education complete, DC therapy  OT Frequency:     Barriers to D/C:            Co-evaluation              AM-PAC OT "6 Clicks" Daily Activity     Outcome Measure Help from another person eating meals?: None Help from another person taking care of personal grooming?: None Help from another person toileting, which includes using toliet, bedpan, or urinal?: None Help from another person bathing (including washing, rinsing, drying)?: None Help from another person to put on and taking off regular upper body clothing?: None Help from another person to put on and taking off regular lower body clothing?: A Little 6 Click Score: 23   End of Session Equipment Utilized During Treatment: Rolling walker Nurse Communication: Mobility status  Activity Tolerance: Patient tolerated treatment well Patient left: in bed;with call bell/phone within reach  OT Visit Diagnosis: Unsteadiness on feet (R26.81)                Time: 1245-1305 OT Time Calculation (min): 20 min Charges:  OT General Charges $OT Visit: 1 Visit OT Evaluation $OT Eval Low Complexity: 1 Low  Lucille Passy, OTR/L Acute Rehabilitation Services Pager [phone] Office [phone]   Lucille Passy M 09/30/2018, 1:48 PM

## 2018-09-30 NOTE — Discharge Summary (Signed)
Physician Discharge Summary  Shirley [name] ELF:[id number] DOB: [birth year] DOA: 09/27/2018  PCP: Ardith Dark, PA-C  Admit date: 09/27/2018 Discharge date: 09/30/2018  Time spent: 50 minutes  Recommendations for Outpatient Follow-up:  1. Patient be discharged home with home health therapies. 2. Follow-up with Hedgecock, Vinnie Level, PA-C in 2 weeks.  On follow-up patient need a basic metabolic profile done to follow-up on electrolytes and renal function.   Discharge Diagnoses:  Principal Problem:   Fall Active Problems:   Dehydration   PAT (paroxysmal atrial tachycardia) / MAT   Acute metabolic encephalopathy   Hypertension   CKD (chronic kidney disease), stage III (HCC)   Stroke (HCC)   Depression with anxiety   Chronic diastolic CHF (congestive heart failure) (HCC)   Right sided weakness   Asthma   Discharge Condition: Stable and improved  Diet recommendation: Heart healthy  There were no vitals filed for this visit.  History of present illness:  HPI per [name] is a 78 y.o. 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  Per her daughter (I called her daughter by phone), patient has been intermittently confused since Saturday.  She has generalized weakness and failure to thrive.  She has been having frequent fall, at least 6-7 times.  She has head injury and bruising to her left arm.  She has pain in right knee. On examination, patient seems to have right-sided weakness.  No facial droop or slurred speech.  Per her daughter, patient does not have chest pain, shortness of breath or cough.  She has mild wheezing sometimes.  No nausea, vomiting, diarrhea, abdominal pain.  No symptoms of UTI.  ED Course: pt was found to have WBC 6.9, negative COVID-19 test, urinalysis (clear  appearance, small amount of leukocyte, rare bacteria, WBC 6-10), slightly worsening renal function, temperature normal, blood pressure 121/66, heart rate 65, oxygen saturation 92-100% on room air.  Chest x-ray negative.  CT head is negative for acute intracranial abnormalities, but showed old left occipital and parietal infarct.  CT of C-spine is negative for bony fracture.  X-ray of her right ankle, left humerus, right knee, pelvis and right hip are negative for bony fracture.  Patient is placed on telemetry bed for observation.  Hospital Course:  1 fall Patient noted to have frequent falls and generalized weakness.  Likely secondary to dehydration in the setting of diuretics.  Urinalysis done showed small amount of leukocytes, rare bacteria, 6-10 WBCs.  Urine cultures negative.  Patient with no signs or symptoms of infection.  Chest x-ray negative for any acute infiltrate.  Patient received a dose of IV Rocephin in the ED and a dose on hospital day 1.  IV antibiotics were subsequently discontinued.  MRI of the head negative for acute CVA.  Patient seen by PT OT who are recommending 24-hour supervision with home health therapies versus skilled nursing facility.  Patient and family elected to go home with home health therapies.  Outpatient follow-up.  2.  Acute metabolic encephalopathy Likely secondary to dehydration and volume depletion as patient noted to have systolic blood pressures in the 90s on admission with probable underlying dementia.  Urinalysis with urine culture was negative.  Patient initially received a days worth of IV antibiotics which was subsequently discontinued.  Chest x-ray was negative for any acute infiltrate.  Patient  remained afebrile.  MRI of the head negative for acute CVA.  Patient improved clinically on hydration and close to baseline by day of discharge.  Patient be discharged home in stable and improved condition.  Outpatient follow-up with PCP.   3.  Paroxysmal atrial  tachycardia/MAT Patient was maintained on metoprolol during the hospitalization.  4.  Hypertension Patient maintained on metoprolol with holding parameters on admission due to systolic blood pressure being borderline.  Patient hydrated IV fluids.  Blood pressure improved.  Patient resumed back on home regimen of Norvasc.  Outpatient follow-up with PCP.   5.  Depression/anxiety Patient maintained on home regimen of Prozac, Neurontin, Wellbutrin XL.  6.  Chronic kidney disease stage III Patient noted to have a slight worsening of renal function with recent baseline creatinine of 2.1 08/22/2018.  Patient's creatinine was 1.1 on 12/28/2016.  On day of admission creatinine was up to 2.30 felt likely secondary to prerenal azotemia in the setting of diuretics and NSAIDs.  Diuretics have been held.  Patient placed on IV fluids which will saline lock.  Ibuprofen discontinued.  Renal function improved such that by day of discharge creatinine was down to 1.23.  Outpatient follow-up with PCP.   7.  History of CVA Patient was maintained on aspirin for secondary stroke prophylaxis.  8.  Chronic diastolic CHF 2D echo from 03/18/2016 with a EF of 65 to 70% with grade 1 diastolic dysfunction.  Patient noted to have some trace lower extremity edema on exam however no edema today.  No JVD.  Chest x-ray negative for any pulmonary edema.  Patient denied any shortness of breath.    Remained compensated.  Patient's torsemide was held and will be resumed on discharge at 10 mg every other day as needed.  Outpatient follow-up with PCP.   9.  Right-sided weakness MRI brain negative for acute CVA.    Patient was seen by PT/OT.  Patient was discharged home with home health therapies.  10.  Asthma Remained stable.  Patient was placed on nebs as needed.    Procedures:  CT head CT C-spine 09/28/2018  MRI brain 09/28/2018  Chest x-ray 09/28/2018, plain films of the left humerus 09/28/2018  Plain films of right knee  09/28/2018  Plain films of the right ankle 09/28/2018  Consultations:  None  Discharge Exam: Vitals:   09/30/18 0841 09/30/18 0925  BP: (!) 161/120 (!) 171/88  Pulse: 64   Resp: 17   Temp: 97.6 F (36.4 C)   SpO2: 97%     General: NAD Cardiovascular: RRR Respiratory: CTAB  Discharge Instructions   Discharge Instructions    Diet - low sodium heart healthy   Complete by: As directed    Increase activity slowly   Complete by: As directed      Allergies as of 09/30/2018   No Known Allergies     Medication List    STOP taking these medications   ibuprofen 200 MG tablet Commonly known as: ADVIL     TAKE these medications   acetaminophen 325 MG tablet Commonly known as: TYLENOL Take 2 tablets (650 mg total) by mouth every 6 (six) hours as needed for mild pain, fever or headache (or Fever >/= 101).   ALPRAZolam 0.5 MG tablet Commonly known as: XANAX Take 0.5-1 mg by mouth 3 (three) times daily as needed for anxiety or sleep.   amLODipine 5 MG tablet Commonly known as: NORVASC Take 5 mg by mouth as needed. If blood pressure is under 130  aspirin EC 81 MG tablet Take 81 mg by mouth daily.   buPROPion 300 MG 24 hr tablet Commonly known as: WELLBUTRIN XL Take 300 mg by mouth daily.   FLUoxetine 40 MG capsule Commonly known as: PROZAC Take 40 mg by mouth daily.   fluticasone 50 MCG/ACT nasal spray Commonly known as: FLONASE Place 2 sprays into both nostrils daily as needed for allergies or rhinitis.   gabapentin 300 MG capsule Commonly known as: NEURONTIN Take 300-900 mg by mouth See admin instructions. Take 300 mg by mouth in the morning, take 300 mg by mouth in the afternoon and take 900 mg by mouth at bedtime   metoprolol tartrate 25 MG tablet Commonly known as: LOPRESSOR Take 1 tablet (25 mg total) by mouth 2 (two) times daily.   MOISTURIZING LUBRICANT EYE OP Place 1 drop into both eyes as needed (for dry eyes).   oxyCODONE 15 MG immediate release  tablet Commonly known as: ROXICODONE Take 15 mg by mouth every 4 (four) hours as needed for pain.   ProAir HFA 108 (90 Base) MCG/ACT inhaler Generic drug: albuterol Take 2 puffs by mouth as needed.   rosuvastatin 5 MG tablet Commonly known as: CRESTOR Take 5 mg by mouth daily.   torsemide 10 MG tablet Commonly known as: DEMADEX Take 1 tablet (10 mg total) by mouth every other day as needed (for fluid). What changed: when to take this   Vitamin D3 125 MCG (5000 UT) Caps Take 5,000 Units by mouth 2 (two) times daily.            Durable Medical Equipment  (From admission, onward)         Start     Ordered   09/29/18 1646  For home use only DME 4 wheeled rolling walker with seat  Once    Question:  Patient needs a walker to treat with the following condition  Answer:  Debility   09/29/18 1647         No Known Allergies Follow-up Information    Hedgecock, Vinnie Level, PA-C. Schedule an appointment as soon as possible for a visit in 2 week(s).   Specialty: Physician Assistant Contact information: [address] [place] [address] [phone]            The results of significant diagnostics from this hospitalization (including imaging, microbiology, ancillary and laboratory) are listed below for reference.    Significant Diagnostic Studies: Dg Chest 1 View  Result Date: 09/28/2018 CLINICAL DATA:  Weakness and failure to thrive EXAM: CHEST  1 VIEW COMPARISON:  07/31/2018 FINDINGS: Cardiac shadow is mildly enlarged but stable. The lungs are clear bilaterally. Postsurgical changes are noted. No acute bony abnormality is seen. IMPRESSION: No acute abnormality noted. Electronically Signed   By: Inez Catalina M.D.   On: 09/28/2018 01:43   Dg Ankle Complete Right  Result Date: 09/28/2018 CLINICAL DATA:  Recent falls with right ankle pain, initial encounter EXAM: RIGHT ANKLE - COMPLETE 3+ VIEW COMPARISON:  None. FINDINGS: Postsurgical changes are noted in the first  metatarsal bone. No acute fracture is identified. No soft tissue abnormality is seen. IMPRESSION: Chronic changes without acute abnormality. Electronically Signed   By: Inez Catalina M.D.   On: 09/28/2018 01:45   Ct Head Wo Contrast  Result Date: 09/28/2018 CLINICAL DATA:  Multiple falls.  Altered mental status EXAM: CT HEAD WITHOUT CONTRAST CT CERVICAL SPINE WITHOUT CONTRAST TECHNIQUE: Multidetector CT imaging of the head and cervical spine was performed following the  standard protocol without intravenous contrast. Multiplanar CT image reconstructions of the cervical spine were also generated. 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 Extensive chronic small vessel disease throughout the deep white matter. Mild cerebral atrophy. No acute intracranial abnormality. Specifically, no hemorrhage, hydrocephalus, mass lesion, acute infarction, or significant intracranial injury. Vascular: No hyperdense vessel or unexpected calcification. Skull: No acute calvarial abnormality. Sinuses/Orbits: Visualized paranasal sinuses and mastoids clear. Orbital soft tissues unremarkable. Other: None CT CERVICAL SPINE FINDINGS Alignment: 4 mm of anterolisthesis of C3 on C4 related to facet disease. Skull base and vertebrae: No acute fracture. No primary bone lesion or focal pathologic process. Soft tissues and spinal canal: No prevertebral fluid or swelling. No visible canal hematoma. Disc levels:  Advanced diffuse degenerative disc and facet disease. 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 No acute findings. 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  Cerebral atrophy. No acute intracranial abnormality. Advanced degenerative changes.  No acute bony abnormality. Electronically Signed   By: Rolm Baptise M.D.   On: 09/28/2018 01:47   Ct Cervical  Spine Wo Contrast  Result Date: 09/28/2018 CLINICAL DATA:  Multiple falls.  Altered mental status EXAM: CT HEAD WITHOUT CONTRAST CT CERVICAL SPINE WITHOUT CONTRAST TECHNIQUE: Multidetector CT imaging of the head and cervical spine was performed following the standard protocol without intravenous contrast. Multiplanar CT image reconstructions of the cervical spine were also generated. 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 Extensive chronic small vessel disease throughout the deep white matter. Mild cerebral atrophy. No acute intracranial abnormality. Specifically, no hemorrhage, hydrocephalus, mass lesion, acute infarction, or significant intracranial injury. Vascular: No hyperdense vessel or unexpected calcification. Skull: No acute calvarial abnormality. Sinuses/Orbits: Visualized paranasal sinuses and mastoids clear. Orbital soft tissues unremarkable. Other: None CT CERVICAL SPINE FINDINGS Alignment: 4 mm of anterolisthesis of C3 on C4 related to facet disease. Skull base and vertebrae: No acute fracture. No primary bone lesion or focal pathologic process. Soft tissues and spinal canal: No prevertebral fluid or swelling. No visible canal hematoma. Disc levels:  Advanced diffuse degenerative disc and facet disease. 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 No acute findings. 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  Cerebral atrophy. No acute intracranial abnormality. Advanced degenerative changes.  No acute bony abnormality. Electronically Signed   By: Rolm Baptise M.D.   On: 09/28/2018 01:47   Mr Brain Wo Contrast  Result Date: 09/28/2018 CLINICAL DATA:  Right-sided weakness EXAM: MRI HEAD WITHOUT CONTRAST TECHNIQUE: Multiplanar, multiecho pulse sequences of the brain and surrounding structures were obtained without  intravenous contrast. COMPARISON:  Head CT from earlier today FINDINGS: Brain: No acute infarction, hemorrhage, hydrocephalus, extra-axial collection or mass lesion. Two small to moderate remote left occipital infarcts. Small remote high left parietal cortex infarct. Confluent FLAIR hyperintensity throughout the cerebral white matter. Mild small vessel ischemic type change in the pons. Brain volume is normal. Vascular: Major flow voids are preserved Skull and upper cervical spine: Negative for marrow lesion. Cervical spine degeneration with retro dental ligament thickening and C3-4 anterolisthesis. Sinuses/Orbits: Mild right mastoid opacification. Negative sinuses and orbits. IMPRESSION: 1. No acute finding. 2. Extensive chronic ischemic injury as described. Electronically Signed   By: Monte Fantasia M.D.   On: 09/28/2018 06:54   Dg Knee  Complete 4 Views Right  Result Date: 09/28/2018 CLINICAL DATA:  Multiple falls. EXAM: RIGHT KNEE - COMPLETE 4+ VIEW COMPARISON:  None. FINDINGS: Prior right knee replacement. No hardware complicating feature. No acute bony abnormality. Specifically, no fracture, subluxation, or dislocation. No joint effusion. IMPRESSION: Prior right knee replacement.  No acute bony abnormality. Electronically Signed   By: Rolm Baptise M.D.   On: 09/28/2018 01:44   Dg Humerus Left  Result Date: 09/28/2018 CLINICAL DATA:  Left arm pain following multiple falls EXAM: LEFT HUMERUS - 2+ VIEW COMPARISON:  None. FINDINGS: Left shoulder replacement is noted. No acute bony abnormality is seen. Postsurgical changes in the proximal ulna are noted. No soft tissue abnormality seen. IMPRESSION: Postsurgical changes without acute abnormality. Electronically Signed   By: Inez Catalina M.D.   On: 09/28/2018 01:43   Dg Hip Unilat W Or Wo Pelvis 2-3 Views Right  Result Date: 09/28/2018 CLINICAL DATA:  Multiple falls.  Altered mental status. EXAM: DG HIP (WITH OR WITHOUT PELVIS) 2-3V RIGHT COMPARISON:  None.  FINDINGS: Prior left hip replacement. No acute bony abnormality. Specifically, no fracture, subluxation, or dislocation. Postoperative changes in the visualized lower lumbar spine. IMPRESSION: No acute bony abnormality. Electronically Signed   By: Rolm Baptise M.D.   On: 09/28/2018 01:43    Microbiology: Recent Results (from the past 240 hour(s))  SARS Coronavirus 2 [facility] order, Performed in [place] hospital lab)     Status: None   Collection Time: 09/28/18 12:54 AM  Result Value Ref Range Status   SARS Coronavirus 2 NEGATIVE NEGATIVE Final    Comment: (NOTE) SARS CoV 2 target nucleic acids are NOT DETECTED. The SARS CoV 2 RNA is generally detectable in upper and lower respiratory specimens during the acute phase of infection. The lowest concentration of SARS CoV 2 viral copies this assay can detect is 250 copies per mL. A negative result does not preclude SARS CoV 2 infection and should not be used as the sole basis for treatment or other patient management decisions. A negative result may occur with improper specimen collection and handling, submission  of specimen other than nasopharyngeal swab, presence of viral mutation(s) within the areas targeted by this assay, and inadequate number of viral copies (less than 250 copies per mL). A negative result must be combined with clinical observations, patient history,  and epidemiological information. The expected result is Negative. Fact Sheet for Patients:   https [employer] media 096283 download Fact Sheet for Healthcare Providers:   https [employer] media 202-490-2189 d Jenel Lucks This test is not yet approved or cleared by the [place] FDA and  has been authorized for detection and/or diagnosis of SARS CoV 2 by FDA under an Emergency Use Authorization (EUA).  This EUA will remain in effect (meaning this test can be used) for the duration of  the COVID19 declaration under Section 564(b)(1) of the Act, 21 U.S.C.  section  360bbb-3(b)(1), unless the authorization is terminated or revoked sooner. Performed at [facility], [place] [address] [place], [place] [address]   Urine culture     Status: None   Collection Time: 09/28/18  4:07 AM   Specimen: Urine, Clean Catch  Result Value Ref Range Status   Specimen Description URINE, CLEAN CATCH  Final   Special Requests NONE  Final   Culture   Final    NO GROWTH Performed at [facility], [place] [address], [place], [place] [address]    Report Status 09/29/2018 FINAL  Final     Labs: Basic Metabolic Panel: Recent Labs  Lab 09/27/18 2145 09/28/18 0938 09/29/18 0419 09/30/18 0234  NA 136 140 140 141  K 4.6 4.5 3.8 3.9  CL 98 104 106 107  CO2 24 25 24  21*  GLUCOSE 137* 109* 122* 106*  BUN 59* 49* 31* 19  CREATININE 2.30* 1.98* 1.42* 1.23*  CALCIUM 9.3 8.8* 8.9 9.1   Liver Function Tests: Recent Labs  Lab 09/27/18 2145  AST 21  ALT 15  ALKPHOS 117  BILITOT 0.6  PROT 7.3  ALBUMIN 4.0   No results for input(s): LIPASE, AMYLASE in the last 168 hours. No results for input(s): AMMONIA in the last 168 hours. CBC: Recent Labs  Lab 09/27/18 2145 09/28/18 0938 09/29/18 0419 09/30/18 0234  WBC 6.9 5.5 6.6  --   NEUTROABS 4.5  --   --   --   HGB 11.5* 10.1* 10.5* 11.5*  HCT 37.7 33.7* 33.9* 36.9  MCV 93.8 94.1 91.1  --   PLT 893* 682* 791*  --    Cardiac Enzymes: No results for input(s): CKTOTAL, CKMB, CKMBINDEX, TROPONINI in the last 168 hours. BNP: BNP (last 3 results) Recent Labs    09/28/18 0938  BNP 249.8*    ProBNP (last 3 results) No results for input(s): PROBNP in the last 8760 hours.  CBG: Recent Labs  Lab 09/29/18 0839 09/30/18 0803  GLUCAP 117* 105*       Signed:  Irine Seal MD.  [facility] 09/30/2018, 1:06 PM

## 2018-09-30 NOTE — TOC Transition Note (Signed)
Transition of Care [facility]) - CM/SW Discharge Note   Patient Details  Name: Shirley [name] MRN: [id number] Date of Birth: March 11, 1940  Transition of Care [facility]) CM/SW Contact:  Sharin Mons, RN Phone Number: 09/30/2018, 2:30 PM   Clinical Narrative:  Admitted with acute metabolic encephalopathy and frequent falls. Pt will d/c to home with the resumption of home health services.Daughter [name] states family will ensure mom has the supervision and assistance needed once d/c.  [place] made aware of d/c plan for today.    Daughter to provide transportation to home.    Final next level of care: Home w Home Health Services Barriers to Discharge: No Barriers Identified   Patient Goals and CMS Choice Patient states their goals for this hospitalization and ongoing recovery are:: pt confused at present; Daughter wants patient to get stronger   Choice offered to / list presented to : NA  Discharge Placement                       Discharge Plan and Services   Discharge Planning Services: CM Consult Post Acute Care Choice: Home Health                    HH Arranged: RN, PT [facility] Agency: [place] Date [place]: 09/29/18 Time [place]: [place] Representative spoke with at [place]: [name] ([place]) Interventions     Readmission Risk Interventions No flowsheet data found.

## 2018-09-30 NOTE — Progress Notes (Signed)
Physical Therapy Treatment Patient Details Name: Shirley [name] MRN: [id number] DOB: 04/13/1940 Today's Date: 09/30/2018    History of Present Illness 78 yo female with onset of AMS and many falls at home has acute metabolic encephalopathy, FTT and is generally weak.  Covid (-).  Has caregivers at home but not at night.  PMHx:  remote CVA's, lumbar surgery, R TKA, L Total shoulder, cervical spondylosis, asthma, DVT, L THA, PAT, PNA, L elbow fracture, PN    PT Comments    Patient presents with better balance today during gait.  Patient modified independent with all mobility.  Do feel patient continues to be a fall risk, but is back to her baseline.  Discussed discharge with patient.  While 24 assistance would be ideal, patient understands risk of fall at home if no assistance at night.  We did have a nice discussion about getting a life-line type service if no assistance at night.       Follow Up Recommendations  Home health PT;Supervision - Intermittent     Equipment Recommendations  None recommended by PT(has rollator at home)    Recommendations for Other Services       Precautions / Restrictions Precautions Precautions: Fall Restrictions Weight Bearing Restrictions: No    Mobility  Bed Mobility Overal bed mobility: Modified Independent(increased time)       Supine to sit: Modified independent (Device/Increase time)        Transfers Overall transfer level: Modified independent Equipment used: Rolling walker (2 wheeled) Transfers: Sit to/from Stand Sit to Stand: Modified independent (Device/Increase time)            Ambulation/Gait Ambulation/Gait assistance: Modified independent (Device/Increase time) Gait Distance (Feet): 100 Feet Assistive device: Rolling walker (2 wheeled) Gait Pattern/deviations: Step-through pattern;Drifts right/left Gait velocity: reduced   General Gait Details: patient drifts to the right but is able to self-correct; no loss of  balance   Stairs             Wheelchair Mobility    Modified Rankin (Stroke Patients Only)       Balance Overall balance assessment: History of Falls Sitting-balance support: No upper extremity supported;Feet supported Sitting balance-Leahy Scale: Good     Standing balance support: No upper extremity supported Standing balance-Leahy Scale: Fair                              Cognition Arousal/Alertness: Awake/alert Behavior During Therapy: WFL for tasks assessed/performed Overall Cognitive Status: Within Functional Limits for tasks assessed                                        Exercises      General Comments General comments (skin integrity, edema, etc.): no loss of balance during gait; reliant on support for gait      Pertinent Vitals/Pain Pain Assessment: No/denies pain    Home Living                      Prior Function            PT Goals (current goals can now be found in the care plan section) Progress towards PT goals: Progressing toward goals    Frequency    Min 3X/week      PT Plan Discharge plan needs to be updated    Co-evaluation  AM-PAC PT "6 Clicks" Mobility   Outcome Measure  Help needed turning from your back to your side while in a flat bed without using bedrails?: A Little Help needed moving from lying on your back to sitting on the side of a flat bed without using bedrails?: A Little Help needed moving to and from a bed to a chair (including a wheelchair)?: None Help needed standing up from a chair using your arms (e.g., wheelchair or bedside chair)?: None Help needed to walk in hospital room?: None Help needed climbing 3-5 steps with a railing? : A Little 6 Click Score: 21    End of Session Equipment Utilized During Treatment: Gait belt Activity Tolerance: Patient tolerated treatment well Patient left: in chair;with call bell/phone within reach;with nursing/sitter  in room   PT Visit Diagnosis: Unsteadiness on feet (R26.81)     Time: 1610-9604 PT Time Calculation (min) (ACUTE ONLY): 18 min  Charges:  $Gait Training: 8-22 mins                     09/30/2018 Shirley [name], PT Acute Rehabilitation Services Pager:  [phone] Office:  [phone]     Shirley [name] 09/30/2018, 11:25 AM

## 2018-09-30 NOTE — Progress Notes (Signed)
Pt and daughter given oral and written discharge instructions. Assisted to vehicle byNT.

## 2018-10-02 DIAGNOSIS — R296 Repeated falls: Secondary | ICD-10-CM | POA: Diagnosis not present

## 2018-10-06 DIAGNOSIS — I471 Supraventricular tachycardia: Secondary | ICD-10-CM | POA: Diagnosis not present

## 2018-10-06 DIAGNOSIS — E860 Dehydration: Secondary | ICD-10-CM | POA: Diagnosis not present

## 2018-10-06 DIAGNOSIS — G9341 Metabolic encephalopathy: Secondary | ICD-10-CM | POA: Diagnosis not present

## 2018-10-06 DIAGNOSIS — R4189 Other symptoms and signs involving cognitive functions and awareness: Secondary | ICD-10-CM | POA: Diagnosis not present

## 2018-10-06 DIAGNOSIS — I6350 Cerebral infarction due to unspecified occlusion or stenosis of unspecified cerebral artery: Secondary | ICD-10-CM | POA: Diagnosis not present

## 2018-10-06 DIAGNOSIS — R531 Weakness: Secondary | ICD-10-CM | POA: Diagnosis not present

## 2018-10-06 DIAGNOSIS — N183 Chronic kidney disease, stage 3 (moderate): Secondary | ICD-10-CM | POA: Diagnosis not present

## 2018-10-06 DIAGNOSIS — F418 Other specified anxiety disorders: Secondary | ICD-10-CM | POA: Diagnosis not present

## 2018-10-06 DIAGNOSIS — I130 Hypertensive heart and chronic kidney disease with heart failure and stage 1 through stage 4 chronic kidney disease, or unspecified chronic kidney disease: Secondary | ICD-10-CM | POA: Diagnosis not present

## 2018-10-06 DIAGNOSIS — I509 Heart failure, unspecified: Secondary | ICD-10-CM | POA: Diagnosis not present

## 2018-10-06 DIAGNOSIS — W19XXXD Unspecified fall, subsequent encounter: Secondary | ICD-10-CM | POA: Diagnosis not present

## 2018-10-06 DIAGNOSIS — R4689 Other symptoms and signs involving appearance and behavior: Secondary | ICD-10-CM | POA: Diagnosis not present

## 2018-10-12 DIAGNOSIS — M47812 Spondylosis without myelopathy or radiculopathy, cervical region: Secondary | ICD-10-CM | POA: Diagnosis not present

## 2018-10-12 DIAGNOSIS — Z79899 Other long term (current) drug therapy: Secondary | ICD-10-CM | POA: Diagnosis not present

## 2018-10-12 DIAGNOSIS — M5136 Other intervertebral disc degeneration, lumbar region: Secondary | ICD-10-CM | POA: Diagnosis not present

## 2018-10-12 DIAGNOSIS — M5030 Other cervical disc degeneration, unspecified cervical region: Secondary | ICD-10-CM | POA: Diagnosis not present

## 2018-10-25 DIAGNOSIS — F329 Major depressive disorder, single episode, unspecified: Secondary | ICD-10-CM | POA: Diagnosis not present

## 2018-10-25 DIAGNOSIS — M4120 Other idiopathic scoliosis, site unspecified: Secondary | ICD-10-CM | POA: Diagnosis not present

## 2018-10-25 DIAGNOSIS — F419 Anxiety disorder, unspecified: Secondary | ICD-10-CM | POA: Diagnosis not present

## 2018-10-25 DIAGNOSIS — N183 Chronic kidney disease, stage 3 (moderate): Secondary | ICD-10-CM | POA: Diagnosis not present

## 2018-10-25 DIAGNOSIS — M797 Fibromyalgia: Secondary | ICD-10-CM | POA: Diagnosis not present

## 2018-10-25 DIAGNOSIS — I83813 Varicose veins of bilateral lower extremities with pain: Secondary | ICD-10-CM | POA: Diagnosis not present

## 2018-10-25 DIAGNOSIS — Z9181 History of falling: Secondary | ICD-10-CM | POA: Diagnosis not present

## 2018-10-25 DIAGNOSIS — M961 Postlaminectomy syndrome, not elsewhere classified: Secondary | ICD-10-CM | POA: Diagnosis not present

## 2018-10-25 DIAGNOSIS — Z7982 Long term (current) use of aspirin: Secondary | ICD-10-CM | POA: Diagnosis not present

## 2018-10-25 DIAGNOSIS — Z8781 Personal history of (healed) traumatic fracture: Secondary | ICD-10-CM | POA: Diagnosis not present

## 2018-10-25 DIAGNOSIS — G894 Chronic pain syndrome: Secondary | ICD-10-CM | POA: Diagnosis not present

## 2018-10-25 DIAGNOSIS — G629 Polyneuropathy, unspecified: Secondary | ICD-10-CM | POA: Diagnosis not present

## 2018-10-25 DIAGNOSIS — Z79891 Long term (current) use of opiate analgesic: Secondary | ICD-10-CM | POA: Diagnosis not present

## 2018-10-25 DIAGNOSIS — I129 Hypertensive chronic kidney disease with stage 1 through stage 4 chronic kidney disease, or unspecified chronic kidney disease: Secondary | ICD-10-CM | POA: Diagnosis not present

## 2018-10-25 DIAGNOSIS — G8191 Hemiplegia, unspecified affecting right dominant side: Secondary | ICD-10-CM | POA: Diagnosis not present

## 2018-10-25 DIAGNOSIS — Z96612 Presence of left artificial shoulder joint: Secondary | ICD-10-CM | POA: Diagnosis not present

## 2018-10-25 DIAGNOSIS — D631 Anemia in chronic kidney disease: Secondary | ICD-10-CM | POA: Diagnosis not present

## 2018-10-25 DIAGNOSIS — Z720 Tobacco use: Secondary | ICD-10-CM | POA: Diagnosis not present

## 2018-10-25 DIAGNOSIS — Z7951 Long term (current) use of inhaled steroids: Secondary | ICD-10-CM | POA: Diagnosis not present

## 2018-10-26 DIAGNOSIS — M5136 Other intervertebral disc degeneration, lumbar region: Secondary | ICD-10-CM | POA: Diagnosis not present

## 2018-10-26 DIAGNOSIS — M5030 Other cervical disc degeneration, unspecified cervical region: Secondary | ICD-10-CM | POA: Diagnosis not present

## 2018-11-02 DIAGNOSIS — R296 Repeated falls: Secondary | ICD-10-CM | POA: Diagnosis not present

## 2018-11-30 DIAGNOSIS — M5136 Other intervertebral disc degeneration, lumbar region: Secondary | ICD-10-CM | POA: Diagnosis not present

## 2018-11-30 DIAGNOSIS — M5030 Other cervical disc degeneration, unspecified cervical region: Secondary | ICD-10-CM | POA: Diagnosis not present

## 2018-11-30 DIAGNOSIS — Z79899 Other long term (current) drug therapy: Secondary | ICD-10-CM | POA: Diagnosis not present

## 2018-12-02 DIAGNOSIS — R296 Repeated falls: Secondary | ICD-10-CM | POA: Diagnosis not present

## 2018-12-27 ENCOUNTER — Encounter: Payer: Self-pay | Admitting: Family Medicine

## 2018-12-27 ENCOUNTER — Ambulatory Visit (INDEPENDENT_AMBULATORY_CARE_PROVIDER_SITE_OTHER): Payer: PPO | Admitting: Family Medicine

## 2018-12-27 ENCOUNTER — Other Ambulatory Visit: Payer: Self-pay

## 2018-12-27 VITALS — BP 125/69 | HR 62 | Temp 98.1°F | Ht 65.0 in | Wt 163.2 lb

## 2018-12-27 DIAGNOSIS — F419 Anxiety disorder, unspecified: Secondary | ICD-10-CM

## 2018-12-27 DIAGNOSIS — Z9181 History of falling: Secondary | ICD-10-CM | POA: Diagnosis not present

## 2018-12-27 DIAGNOSIS — I6930 Unspecified sequelae of cerebral infarction: Secondary | ICD-10-CM | POA: Diagnosis not present

## 2018-12-27 NOTE — Progress Notes (Signed)
PATIENT: Shirley [name] DOB: 1940-02-25  REASON FOR VISIT: follow up HISTORY FROM: patient  Chief Complaint  Patient presents with  . Follow-up    room 1, with Caregiver. Had a reaction to buspirone. would like a refill on Xanax     HISTORY OF PRESENT ILLNESS: Today 12/27/18 Shirley [name] is a 78 y.o. female here today for follow up of left MCA ischemic infarct. She presents with Shirley [name], her caregiver, who aids in history. She denies any stroke like symptoms since last being seen. TTE and carotid US was normal. She was admitted to hospital in 09/2018 for intermittent confusion and weakness. Workup showed dehydration. MRI showed extensive chronic ischemic injury. No new CVA. She reports that her PCP took her off Xanax about a month ago. Her daughter was concerned about increased falls and thought Xanax could be contributing.  She was started on buspirone but had side effects of nausea and vomiting. She has suffered from anxiety for many years. She continues Prozac 40mg  and Wellbutrin 300mg  daily. She is drinking about 40-50 ounces of water a day plus other fluids such as coffee and juice. Shirley [name] is with her during the day and a different caregiver comes at night and stays on weekends. She has been able to do all ADL's independently.  No falls.  Shirley [name] and Shirley [name] both feel that memory is stable.  Shirley [name] states that occasionally she will have trouble recalling information but this typically comes back to her.  PCP is following closely with regular labs. She is taking asa 81mg  and rosuvastatin. Metoprolol and amlodipine for BP management and control of PAT.     HISTORY: (copied from Dr Gladstone Lighter note on 04/19/2018)  78 year old female with hypertension, multiple orthopedic fractures and surgeries, gait and balance difficulty, multiple strokes, here for evaluation of recent stroke.  December 2019 patient had sudden onset of right hand numbness, clenching locked up right hand, lasting for  a few hours.  Symptoms improved over time but did not resolve.  2 weeks later she saw PCP who ordered MRI of the brain.  This showed a subacute left MCA ischemic infarct.  Patient also has had 2 chronic left PCA infarcts sometime in the past.  She denies any unilateral vision loss problems.  Patient has gone through physical and occupational therapy.  Her right hand function has improved.    Referring PCP notes indicate that patient's daughter (who is not here today) was concerned about paranoid behavior, memory loss and dementia.  Patient herself denies any major memory problems.  I checked MMSE which was 26 out of 30.  She has significant limitations in her ADLs, mainly due to her physical limitations.  Patient was not on aspirin before her stroke.  Now she is on 81 mg aspirin per day.  I do not see any recent hemoglobin A1c or lipid profile in the computer system.   REVIEW OF SYSTEMS: Out of a complete 14 system review of symptoms, the patient complains only of the following symptoms, headache, numbness, weakness, tremors and all other reviewed systems are negative.  ALLERGIES: Allergies  Allergen Reactions  . Buspirone Hcl     Headaches, twitching    HOME MEDICATIONS: Outpatient Medications Prior to Visit  Medication Sig Dispense Refill  . acetaminophen (TYLENOL) 325 MG tablet Take 2 tablets (650 mg total) by mouth every 6 (six) hours as needed for mild pain, fever or headache (or Fever >/= 101).    Marland Kitchen amLODipine (NORVASC) 5  MG tablet Take 5 mg by mouth as needed. If blood pressure is under 130    . aspirin EC 81 MG tablet Take 81 mg by mouth daily.    Marland Kitchen buPROPion (WELLBUTRIN XL) 300 MG 24 hr tablet Take 300 mg by mouth daily.    . Carboxymethylcellulose Sodium (MOISTURIZING LUBRICANT EYE OP) Place 1 drop into both eyes as needed (for dry eyes).     . Cholecalciferol (VITAMIN D3) 5000 units CAPS Take 5,000 Units by mouth 2 (two) times daily.    Marland Kitchen CRANBERRY EXTRACT PO Take by mouth.     Marland Kitchen FLUoxetine (PROZAC) 40 MG capsule Take 40 mg by mouth daily.     . fluticasone (FLONASE) 50 MCG/ACT nasal spray Place 2 sprays into both nostrils daily as needed for allergies or rhinitis.     Marland Kitchen gabapentin (NEURONTIN) 300 MG capsule Take 300-900 mg by mouth See admin instructions. Take 300 mg by mouth in the morning, take 300 mg by mouth in the afternoon and take 900 mg by mouth at bedtime    . metoprolol tartrate (LOPRESSOR) 25 MG tablet Take 1 tablet (25 mg total) by mouth 2 (two) times daily. 60 tablet 0  . oxyCODONE (ROXICODONE) 15 MG immediate release tablet Take 15 mg by mouth every 4 (four) hours as needed for pain.     Marland Kitchen PROAIR HFA 108 (90 Base) MCG/ACT inhaler Take 2 puffs by mouth as needed.    . psyllium (METAMUCIL) 58.6 % powder Take 1 packet by mouth 3 (three) times daily.    . rosuvastatin (CRESTOR) 5 MG tablet Take 5 mg by mouth daily.    Marland Kitchen torsemide (DEMADEX) 10 MG tablet Take 1 tablet (10 mg total) by mouth every other day as needed (for fluid).    . vitamin B-12 (CYANOCOBALAMIN) 500 MCG tablet Take 500 mcg by mouth daily.    Marland Kitchen ALPRAZolam (XANAX) 0.5 MG tablet Take 0.5-1 mg by mouth 3 (three) times daily as needed for anxiety or sleep.      No facility-administered medications prior to visit.     PAST MEDICAL HISTORY: Past Medical History:  Diagnosis Date  . Allergy    vasomotor rhinitis  . Anemia   . Asthma    h/o asthma and bronchitis  . BV (bacterial vaginosis)    history of frequent  . Cervical spondylosis    herniated disk protruding @ C6-7, impinging cord and left axillary root sleeve.  . Chronic pain   . Closed olecranon fracture, left, initial encounter   . Depression   . DVT of upper extremity (deep vein thrombosis) ([place])    h/o left(after wrist fracture/surgery); no residual thrombus or phlebitis by Do[place] July 2014  . Dyspnea    with exertion  . Edema   . Fibromyalgia    sees Dr.Phillips-pain management  . GERD (gastroesophageal reflux disease)     h/o  . Headache    marigraine- hormal - none in years  . Heart murmur   . Hypertension    never has been treated that daughter is aware  . Neuropathy   . Neuropathy   . Osteopenia    mild at hip (T-1.3)  . PAT (paroxysmal atrial tachycardia) ([place])   . Pneumonia    years ago  . Recurrent falls 04/16/2016  . Varicose veins   . Varicose veins of both legs with edema 08/2012   Also pain; bilateral GSV dilated with reflux, R Short SV dilated with reflux; not amenable to VNUS  ablation due to tortuosity and superficial nature of veins -- recommeded referral to Dr. Eilleen Kempf @ [facility]  . Vision problem    wears glasses  . Vitamin D deficiency     PAST SURGICAL HISTORY: Past Surgical History:  Procedure Laterality Date  . ABDOMINAL HYSTERECTOMY  1999   BSO for endometriosis  . APPENDECTOMY    . BACK SURGERY  age 71   ruptured disk L3-4   . CATARACT EXTRACTION  2000   B/L  . COLONOSCOPY    . FINGER SURGERY     left finger for tender rupture from fall.  Marland Kitchen FOOT SURGERY     multiple(at least 4 on right, 5 on left)-left Dr.Kerner([place])11/08,left Dr.Nunley-excision 2nd and 3rd mt heads w/artho and hammertoe surgery 4th and 5th 04/2006. left great toe IP fusion and revision of fusion of 2nd through 5th toes 12/2005. Right foot surgeries  Dr.Bednarz 04/2008 and 04/2009.  Marland Kitchen KNEE SURGERY Right    right knee replacement  . NECK SURGERY  2007   cervical  . ORIF ELBOW FRACTURE Left 03/22/2016   Procedure: OPEN REDUCTION INTERNAL FIXATION (ORIF) ELBOW/OLECRANON FRACTURE;  Surgeon: Altamese [place], MD;  Location: [place];  Service: Orthopedics;  Laterality: Left;  . ORIF ELBOW FRACTURE Left 04/15/2016   Procedure: OPEN REDUCTION INTERNAL FIXATION (ORIF) LEFT ELBOW/OLECRANON FRACTURE;  Surgeon: Altamese Lincolnshire, MD;  Location: [facility];  Service: Orthopedics;  Laterality: Left;  . RADIOLOGY WITH ANESTHESIA Left 01/06/2016   Procedure: MRI LEFT HIP WITH AND WITHOUT;  Surgeon: Medication  Radiologist, MD;  Location: [facility];  Service: Radiology;  Laterality: Left;  . REFRACTIVE SURGERY  2007   left eye  . REVERSE SHOULDER ARTHROPLASTY Left 12/31/2016   Procedure: REVERSE LEFT SHOULDER ARTHROPLASTY;  Surgeon: Netta Cedars, MD;  Location: [facility];  Service: Orthopedics;  Laterality: Left;  . SHOULDER SURGERY     x8 (4 on rt side and 4 on left side)  . SPINAL FUSION  6/09   Dr.Cohen  . STERIOD INJECTION Left 04/15/2016   Procedure: LEFT HIP STEROID INJECTION;  Surgeon: Altamese [place], MD;  Location: [facility];  Service: Orthopedics;  Laterality: Left;  . TONSILLECTOMY AND ADENOIDECTOMY    . TOTAL HIP ARTHROPLASTY Left 08/18/2016   Procedure: LEFT TOTAL HIP ARTHROPLASTY ANTERIOR APPROACH;  Surgeon: Gaynelle Arabian, MD;  Location: [facility];  Service: Orthopedics;  Laterality: Left;  . TRANSTHORACIC ECHOCARDIOGRAM  July 2012   Normal EF 60-65% , mild concentric LVH. Grade 2 diastolic dysfunction with elevated EDP. Massive left atrial dilation. Pulmonary pressures 30-40 mm Hg; aortic sclerosis but no stenosis. Moderate TR  . TRANSTHORACIC ECHOCARDIOGRAM  03/2016   EF 65-70%. Normal wall motion. GR 1 DD. Severe LA dilation. Mild to moderate TR with mild to moderately elevated PA pressures (44 mmHg) mild aortic calcification but no stenosis. No source of emboli   . WRIST SURGERY Left    2 surgeries left wrist after fracture(complicated by DVT)    FAMILY HISTORY: Family History  Problem Relation Age of Onset  . Stroke Mother   . Osteoarthritis Mother        Died at age 48  . Heart disease Father   . Lung cancer Father   . Heart failure Father        Died at age 82  . Osteoporosis Sister   . Diabetes Brother   . Heart attack Brother 52  . Diabetes Brother   . Diabetes Brother   . Heart attack Brother 33  .  Breast cancer Paternal Aunt   . Heart failure Maternal Grandmother        Died at age 74, unsure of her heart disease  . Heart failure Paternal Grandfather        Died at age 60,  unsure of cardiac disease.    SOCIAL HISTORY: Social History   Socioeconomic History  . Marital status: Married    Spouse name: [place]  . Number of children: Not on file  . Years of education: Not on file  . Highest education level: Not on file  Occupational History  . Not on file  Social Needs  . Financial resource strain: Not on file  . Food insecurity    Worry: Not on file    Inability: Not on file  . Transportation needs    Medical: Not on file    Non-medical: Not on file  Tobacco Use  . Smoking status: Former Smoker    Quit date: 02/22/1958    Years since quitting: 60.8  . Smokeless tobacco: Never Used  . Tobacco comment: doesn't smoke every day  Substance and Sexual Activity  . Alcohol use: No    Alcohol/week: 0.0 standard drinks  . Drug use: No  . Sexual activity: Not on file  Lifestyle  . Physical activity    Days per week: Not on file    Minutes per session: Not on file  . Stress: Not on file  Relationships  . Social Herbalist on phone: Not on file    Gets together: Not on file    Attends religious service: Not on file    Active member of club or organization: Not on file    Attends meetings of clubs or organizations: Not on file    Relationship status: Not on file  . Intimate partner violence    Fear of current or ex partner: Not on file    Emotionally abused: Not on file    Physically abused: Not on file    Forced sexual activity: Not on file  Other Topics Concern  . Not on file  Social History Narrative   Lives with husband.  3 children ([place], [place], [place]), 5 grandchildren.  Education trade school.  Retired.  Caffeine 2 x daily.        PHYSICAL EXAM  Vitals:   12/27/18 1009  BP: 125/69  Pulse: 62  Temp: 98.1 F (36.7 C)  Weight: 163 lb 3.2 oz (74 kg)  Height: 5\' 5"  (1.651 m)   Body mass index is 27.16 kg/m.  Generalized: Well developed, in no acute distress  Cardiology: normal rate and rhythm, no murmur noted  Neurological examination  Mentation: Alert oriented to time, place, history taking. Follows all commands speech and language fluent Cranial nerve II-XII: Pupils were equal round reactive to light. Extraocular movements were full, visual field were full on confrontational test. Facial sensation and strength were normal. Uvula tongue midline. Head turning and shoulder shrug  were normal and symmetric. Motor: The motor testing reveals 5 over 5 strength of all 4 extremities. Good symmetric motor tone is noted throughout.  Sensory: Sensory testing is intact to soft touch on all 4 extremities. No evidence of extinction is noted.  Coordination: Cerebellar testing reveals good finger-nose-finger and heel-to-shin bilaterally.  Gait and station: Gait is short but stable with Rollator Reflexes: Deep tendon reflexes are symmetric and normal bilaterally.   DIAGNOSTIC DATA (LABS, IMAGING, TESTING) - I reviewed patient records, labs, notes, testing and imaging myself  where available.  MMSE - Mini Mental State Exam 04/19/2018  Orientation to time 5  Orientation to Place 5  Registration 3  Attention/ Calculation 3  Recall 3  Language- name 2 objects 1  Language- repeat 1  Language- follow 3 step command 3  Language- read & follow direction 1  Write a sentence 1  Copy design 0  Total score 26     Lab Results  Component Value Date   WBC 6.6 09/29/2018   HGB 11.5 (L) 09/30/2018   HCT 36.9 09/30/2018   MCV 91.1 09/29/2018   PLT 791 (H) 09/29/2018      Component Value Date/Time   NA 141 09/30/2018 0234   K 3.9 09/30/2018 0234   CL 107 09/30/2018 0234   CO2 21 (L) 09/30/2018 0234   GLUCOSE 106 (H) 09/30/2018 0234   BUN 19 09/30/2018 0234   CREATININE 1.23 (H) 09/30/2018 0234   CREATININE 0.66 07/19/2012 1651   CALCIUM 9.1 09/30/2018 0234   PROT 7.3 09/27/2018 2145   ALBUMIN 4.0 09/27/2018 2145   AST 21 09/27/2018 2145   ALT 15 09/27/2018 2145   ALKPHOS 117 09/27/2018 2145   BILITOT 0.6  09/27/2018 2145   GFRNONAA 41 (L) 09/30/2018 0234   GFRAA 48 (L) 09/30/2018 0234   Lab Results  Component Value Date   CHOL 152 03/18/2016   HDL 55 03/18/2016   LDLCALC 84 03/18/2016   TRIG 64 03/18/2016   CHOLHDL 2.8 03/18/2016   Lab Results  Component Value Date   HGBA1C 6.0 (H) 03/18/2016   Lab Results  Component Value Date   VITAMINB12 266 03/18/2016   Lab Results  Component Value Date   TSH 2.125 09/28/2018    ASSESSMENT AND PLAN 78 y.o. year old female  has a past medical history of Allergy, Anemia, Asthma, BV (bacterial vaginosis), Cervical spondylosis, Chronic pain, Closed olecranon fracture, left, initial encounter, Depression, DVT of upper extremity (deep vein thrombosis) ([place]), Dyspnea, Edema, Fibromyalgia, GERD (gastroesophageal reflux disease), Headache, Heart murmur, Hypertension, Neuropathy, Neuropathy, Osteopenia, PAT (paroxysmal atrial tachycardia) ([place]), Pneumonia, Recurrent falls (04/16/2016), Varicose veins, Varicose veins of both legs with edema (08/2012), Vision problem, and Vitamin D deficiency. here with     ICD-10-CM   1. Chronic ischemic left MCA stroke  I69.30   2. At high risk for falls  Z91.81   3. Anxiety  F41.9     Overall Shirley [name] is doing very well.  She continues close monitoring with primary care for stroke prevention.  She continues aspirin 81 mg and rosuvastatin 5 mg daily.  No stroke symptoms noted since last being seen.  She was encouraged to continue daily activity as tolerated.  Healthy well-balanced diet advised.  Blood pressure and cholesterol management with primary care encouraged.  Goals discussed and provided in AVS.  She was also advised to speak with primary care regarding concerns of anxiety.  Patient is also on chronic opioid therapy.  I am not certain that benzodiazepine therapy is appropriate but will defer to PCP.  We have also discussed considering psychiatry referral if PCP agrees.  She will follow-up with me in 1 year, sooner if  needed.  She verbalizes understanding and agreement with this plan.   No orders of the defined types were placed in this encounter.    No orders of the defined types were placed in this encounter.      Debbora Presto, FNP-C 12/27/2018, 12:12 PM [facility] [address] [place], [place]  [address] [phone]  

## 2018-12-27 NOTE — Patient Instructions (Signed)
Continue follow up with PCP for stroke prevention. BP goal < 130/80, LDL goal < 70  Follow up with PCP for concerns of anxiety. May consider psychiatry if PCP agrees.   Follow up in 1 year, sooner if needed   Stroke Prevention Some medical conditions and lifestyle choices can lead to a higher risk for a stroke. You can help to prevent a stroke by making nutrition, lifestyle, and other changes. What nutrition changes can be made?   Eat healthy foods. ? Choose foods that are high in fiber. These include:  Fresh fruits.  Fresh vegetables.  Whole grains. ? Eat at least 5 or more servings of fruits and vegetables each day. Try to fill half of your plate at each meal with fruits and vegetables. ? Choose lean protein foods. These include:  Lowfat (lean) cuts of meat.  Chicken without skin.  Fish.  Tofu.  Beans.  Nuts. ? Eat low-fat dairy products. ? Avoid foods that:  Are high in salt (sodium).  Have saturated fat.  Have trans fat.  Have cholesterol.  Are processed.  Are premade.  Follow eating guidelines as told by your doctor. These may include: ? Reducing how many calories you eat and drink each day. ? Limiting how much salt you eat or drink each day to 1,500 milligrams (mg). ? Using only healthy fats for cooking. These include:  Olive oil.  Canola oil.  Sunflower oil. ? Counting how many carbohydrates you eat and drink each day. What lifestyle changes can be made?  Try to stay at a healthy weight. Talk to your doctor about what a good weight is for you.  Get at least 30 minutes of moderate physical activity at least 5 days a week. This can include: ? Fast walking. ? Biking. ? Swimming.  Do not use any products that have nicotine or tobacco. This includes cigarettes and e-cigarettes. If you need help quitting, ask your doctor. Avoid being around tobacco smoke in general.  Limit how much alcohol you drink to no more than 1 drink a day for nonpregnant  women and 2 drinks a day for men. One drink equals 12 oz of beer, 5 oz of wine, or 1 oz of hard liquor.  Do not use drugs.  Avoid taking birth control pills. Talk to your doctor about the risks of taking birth control pills if: ? You are over 37 years old. ? You smoke. ? You get migraines. ? You have had a blood clot. What other changes can be made?  Manage your cholesterol. ? It is important to eat a healthy diet. ? If your cholesterol cannot be managed through your diet, you may also need to take medicines. Take medicines as told by your doctor.  Manage your diabetes. ? It is important to eat a healthy diet and to exercise regularly. ? If your blood sugar cannot be managed through diet and exercise, you may need to take medicines. Take medicines as told by your doctor.  Control your high blood pressure (hypertension). ? Try to keep your blood pressure below 130/80. This can help lower your risk of stroke. ? It is important to eat a healthy diet and to exercise regularly. ? If your blood pressure cannot be managed through diet and exercise, you may need to take medicines. Take medicines as told by your doctor. ? Ask your doctor if you should check your blood pressure at home. ? Have your blood pressure checked every year. Do this even if your  blood pressure is normal.  Talk to your doctor about getting checked for a sleep disorder. Signs of this can include: ? Snoring a lot. ? Feeling very tired.  Take over-the-counter and prescription medicines only as told by your doctor. These may include aspirin or blood thinners (antiplatelets or anticoagulants).  Make sure that any other medical conditions you have are managed. Where to find more information  American Stroke Association: www.strokeassociation.org  National Stroke Association: www.stroke.org Get help right away if:  You have any symptoms of stroke. "BE FAST" is an easy way to remember the main warning signs: ? B -  Balance. Signs are dizziness, sudden trouble walking, or loss of balance. ? E - Eyes. Signs are trouble seeing or a sudden change in how you see. ? F - Face. Signs are sudden weakness or loss of feeling of the face, or the face or eyelid drooping on one side. ? A - Arms. Signs are weakness or loss of feeling in an arm. This happens suddenly and usually on one side of the body. ? S - Speech. Signs are sudden trouble speaking, slurred speech, or trouble understanding what people say. ? T - Time. Time to call emergency services. Write down what time symptoms started.  You have other signs of stroke, such as: ? A sudden, very bad headache with no known cause. ? Feeling sick to your stomach (nausea). ? Throwing up (vomiting). ? Jerky movements you cannot control (seizure). These symptoms may represent a serious problem that is an emergency. Do not wait to see if the symptoms will go away. Get medical help right away. Call your local emergency services (911 in the [place] Do not drive yourself to the hospital. Summary  You can prevent a stroke by eating healthy, exercising, not smoking, drinking less alcohol, and treating other health problems, such as diabetes, high blood pressure, or high cholesterol.  Do not use any products that contain nicotine or tobacco, such as cigarettes and e-cigarettes.  Get help right away if you have any signs or symptoms of a stroke. This information is not intended to replace advice given to you by your health care provider. Make sure you discuss any questions you have with your health care provider. Document Released: 08/10/2011 Document Revised: 04/06/2018 Document Reviewed: 05/12/2016 Elsevier Patient Education  [place].   Generalized Anxiety Disorder, Adult Generalized anxiety disorder (GAD) is a mental health disorder. People with this condition constantly worry about everyday events. Unlike normal anxiety, worry related to GAD is not triggered by a  specific event. These worries also do not fade or get better with time. GAD interferes with life functions, including relationships, work, and school. GAD can vary from mild to severe. People with severe GAD can have intense waves of anxiety with physical symptoms (panic attacks). What are the causes? The exact cause of GAD is not known. What increases the risk? This condition is more likely to develop in:  Women.  People who have a family history of anxiety disorders.  People who are very shy.  People who experience very stressful life events, such as the death of a loved one.  People who have a very stressful family environment. What are the signs or symptoms? People with GAD often worry excessively about many things in their lives, such as their health and family. They may also be overly concerned about:  Doing well at work.  Being on time.  Natural disasters.  Friendships. Physical symptoms of GAD include:  Fatigue.  Muscle tension or having muscle twitches.  Trembling or feeling shaky.  Being easily startled.  Feeling like your heart is pounding or racing.  Feeling out of breath or like you cannot take a deep breath.  Having trouble falling asleep or staying asleep.  Sweating.  Nausea, diarrhea, or irritable bowel syndrome (IBS).  Headaches.  Trouble concentrating or remembering facts.  Restlessness.  Irritability. How is this diagnosed? Your health care provider can diagnose GAD based on your symptoms and medical history. You will also have a physical exam. The health care provider will ask specific questions about your symptoms, including how severe they are, when they started, and if they come and go. Your health care provider may ask you about your use of alcohol or drugs, including prescription medicines. Your health care provider may refer you to a mental health specialist for further evaluation. Your health care provider will do a thorough  examination and may perform additional tests to rule out other possible causes of your symptoms. To be diagnosed with GAD, a person must have anxiety that:  Is out of his or her control.  Affects several different aspects of his or her life, such as work and relationships.  Causes distress that makes him or her unable to take part in normal activities.  Includes at least three physical symptoms of GAD, such as restlessness, fatigue, trouble concentrating, irritability, muscle tension, or sleep problems. Before your health care provider can confirm a diagnosis of GAD, these symptoms must be present more days than they are not, and they must last for six months or longer. How is this treated? The following therapies are usually used to treat GAD:  Medicine. Antidepressant medicine is usually prescribed for long-term daily control. Antianxiety medicines may be added in severe cases, especially when panic attacks occur.  Talk therapy (psychotherapy). Certain types of talk therapy can be helpful in treating GAD by providing support, education, and guidance. Options include: ? Cognitive behavioral therapy (CBT). People learn coping skills and techniques to ease their anxiety. They learn to identify unrealistic or negative thoughts and behaviors and to replace them with positive ones. ? Acceptance and commitment therapy (ACT). This treatment teaches people how to be mindful as a way to cope with unwanted thoughts and feelings. ? Biofeedback. This process trains you to manage your body's response (physiological response) through breathing techniques and relaxation methods. You will work with a therapist while machines are used to monitor your physical symptoms.  Stress management techniques. These include yoga, meditation, and exercise. A mental health specialist can help determine which treatment is best for you. Some people see improvement with one type of therapy. However, other people require a  combination of therapies. Follow these instructions at home:  Take over-the-counter and prescription medicines only as told by your health care provider.  Try to maintain a normal routine.  Try to anticipate stressful situations and allow extra time to manage them.  Practice any stress management or self-calming techniques as taught by your health care provider.  Do not punish yourself for setbacks or for not making progress.  Try to recognize your accomplishments, even if they are small.  Keep all follow-up visits as told by your health care provider. This is important. Contact a health care provider if:  Your symptoms do not get better.  Your symptoms get worse.  You have signs of depression, such as: ? A persistently sad, cranky, or irritable mood. ? Loss of enjoyment in activities that  used to bring you joy. ? Change in weight or eating. ? Changes in sleeping habits. ? Avoiding friends or family members. ? Loss of energy for normal tasks. ? Feelings of guilt or worthlessness. Get help right away if:  You have serious thoughts about hurting yourself or others. If you ever feel like you may hurt yourself or others, or have thoughts about taking your own life, get help right away. You can go to your nearest emergency department or call:  Your local emergency services (911 in the [place]  A suicide crisis helpline, such as the [facility] at 4232654317. This is open 24 hours a day. Summary  Generalized anxiety disorder (GAD) is a mental health disorder that involves worry that is not triggered by a specific event.  People with GAD often worry excessively about many things in their lives, such as their health and family.  GAD may cause physical symptoms such as restlessness, trouble concentrating, sleep problems, frequent sweating, nausea, diarrhea, headaches, and trembling or muscle twitching.  A mental health specialist can help determine  which treatment is best for you. Some people see improvement with one type of therapy. However, other people require a combination of therapies. This information is not intended to replace advice given to you by your health care provider. Make sure you discuss any questions you have with your health care provider. Document Released: 06/05/2012 Document Revised: 01/21/2017 Document Reviewed: 12/30/2015 Elsevier Patient Education  [place].   Memory Compensation Strategies  1. Use "WARM" strategy.  W= write it down  A= associate it  R= repeat it  M= make a mental note  2.   You can keep a Social worker.  Use a 3-ring notebook with sections for the following: calendar, important names and phone numbers,  medications, doctors' names/phone numbers, lists/reminders, and a section to journal what you did  each day.   3.    Use a calendar to write appointments down.  4.    Write yourself a schedule for the day.  This can be placed on the calendar or in a separate section of the Memory Notebook.  Keeping a  regular schedule can help memory.  5.    Use medication organizer with sections for each day or morning/evening pills.  You may need help loading it  6.    Keep a basket, or pegboard by the door.  Place items that you need to take out with you in the basket or on the pegboard.  You may also want to  include a message board for reminders.  7.    Use sticky notes.  Place sticky notes with reminders in a place where the task is performed.  For example: " turn off the  stove" placed by the stove, "lock the door" placed on the door at eye level, " take your medications" on  the bathroom mirror or by the place where you normally take your medications.  8.    Use alarms/timers.  Use while cooking to remind yourself to check on food or as a reminder to take your medicine, or as a  reminder to make a call, or as a reminder to perform another task, etc.

## 2019-01-02 DIAGNOSIS — R296 Repeated falls: Secondary | ICD-10-CM | POA: Diagnosis not present

## 2019-01-05 NOTE — Progress Notes (Signed)
I reviewed note and agree with plan.   Penni Bombard, MD 39/26/5997, 8:77 AM Certified in Neurology, Neurophysiology and Neuroimaging  [facility] [address] [place], [place] [address] [phone]

## 2019-01-08 DIAGNOSIS — M5136 Other intervertebral disc degeneration, lumbar region: Secondary | ICD-10-CM | POA: Diagnosis not present

## 2019-01-08 DIAGNOSIS — M5030 Other cervical disc degeneration, unspecified cervical region: Secondary | ICD-10-CM | POA: Diagnosis not present

## 2019-01-08 DIAGNOSIS — Z79899 Other long term (current) drug therapy: Secondary | ICD-10-CM | POA: Diagnosis not present

## 2019-01-10 DIAGNOSIS — N3000 Acute cystitis without hematuria: Secondary | ICD-10-CM | POA: Diagnosis not present

## 2019-01-10 DIAGNOSIS — F419 Anxiety disorder, unspecified: Secondary | ICD-10-CM | POA: Diagnosis not present

## 2019-01-10 DIAGNOSIS — F5102 Adjustment insomnia: Secondary | ICD-10-CM | POA: Diagnosis not present

## 2019-01-10 DIAGNOSIS — Z87891 Personal history of nicotine dependence: Secondary | ICD-10-CM | POA: Diagnosis not present

## 2019-01-10 DIAGNOSIS — R634 Abnormal weight loss: Secondary | ICD-10-CM | POA: Diagnosis not present

## 2019-01-10 DIAGNOSIS — R7309 Other abnormal glucose: Secondary | ICD-10-CM | POA: Diagnosis not present

## 2019-01-10 DIAGNOSIS — E559 Vitamin D deficiency, unspecified: Secondary | ICD-10-CM | POA: Diagnosis not present

## 2019-01-10 DIAGNOSIS — R110 Nausea: Secondary | ICD-10-CM | POA: Diagnosis not present

## 2019-01-10 DIAGNOSIS — Z6828 Body mass index (BMI) 28.0-28.9, adult: Secondary | ICD-10-CM | POA: Diagnosis not present

## 2019-01-10 DIAGNOSIS — Z9189 Other specified personal risk factors, not elsewhere classified: Secondary | ICD-10-CM | POA: Diagnosis not present

## 2019-01-10 DIAGNOSIS — F418 Other specified anxiety disorders: Secondary | ICD-10-CM | POA: Diagnosis not present

## 2019-01-10 DIAGNOSIS — I10 Essential (primary) hypertension: Secondary | ICD-10-CM | POA: Diagnosis not present

## 2019-01-10 DIAGNOSIS — R8290 Unspecified abnormal findings in urine: Secondary | ICD-10-CM | POA: Diagnosis not present

## 2019-01-10 DIAGNOSIS — Z23 Encounter for immunization: Secondary | ICD-10-CM | POA: Diagnosis not present

## 2019-02-01 DIAGNOSIS — R296 Repeated falls: Secondary | ICD-10-CM | POA: Diagnosis not present

## 2019-02-02 DIAGNOSIS — M542 Cervicalgia: Secondary | ICD-10-CM | POA: Diagnosis not present

## 2019-02-06 ENCOUNTER — Encounter (HOSPITAL_BASED_OUTPATIENT_CLINIC_OR_DEPARTMENT_OTHER): Payer: PPO | Admitting: Internal Medicine

## 2019-02-12 DIAGNOSIS — Z1159 Encounter for screening for other viral diseases: Secondary | ICD-10-CM | POA: Diagnosis not present

## 2019-02-12 DIAGNOSIS — Z6827 Body mass index (BMI) 27.0-27.9, adult: Secondary | ICD-10-CM | POA: Diagnosis not present

## 2019-02-12 DIAGNOSIS — M5030 Other cervical disc degeneration, unspecified cervical region: Secondary | ICD-10-CM | POA: Diagnosis not present

## 2019-02-12 DIAGNOSIS — M5136 Other intervertebral disc degeneration, lumbar region: Secondary | ICD-10-CM | POA: Diagnosis not present

## 2019-02-14 ENCOUNTER — Inpatient Hospital Stay (HOSPITAL_COMMUNITY): Payer: PPO

## 2019-02-14 ENCOUNTER — Other Ambulatory Visit: Payer: Self-pay

## 2019-02-14 ENCOUNTER — Emergency Department (HOSPITAL_COMMUNITY): Payer: PPO

## 2019-02-14 ENCOUNTER — Inpatient Hospital Stay (HOSPITAL_COMMUNITY)
Admission: EM | Admit: 2019-02-14 | Discharge: 2019-02-17 | DRG: 682 | Disposition: A | Discharge status: Home Health | Payer: PPO | Attending: Internal Medicine | Admitting: Internal Medicine

## 2019-02-14 DIAGNOSIS — Z8262 Family history of osteoporosis: Secondary | ICD-10-CM

## 2019-02-14 DIAGNOSIS — N390 Urinary tract infection, site not specified: Secondary | ICD-10-CM | POA: Diagnosis present

## 2019-02-14 DIAGNOSIS — B961 Klebsiella pneumoniae [K. pneumoniae] as the cause of diseases classified elsewhere: Secondary | ICD-10-CM | POA: Diagnosis present

## 2019-02-14 DIAGNOSIS — S81812A Laceration without foreign body, left lower leg, initial encounter: Secondary | ICD-10-CM | POA: Diagnosis not present

## 2019-02-14 DIAGNOSIS — Z96642 Presence of left artificial hip joint: Secondary | ICD-10-CM | POA: Diagnosis present

## 2019-02-14 DIAGNOSIS — F0390 Unspecified dementia without behavioral disturbance: Secondary | ICD-10-CM | POA: Diagnosis present

## 2019-02-14 DIAGNOSIS — F419 Anxiety disorder, unspecified: Secondary | ICD-10-CM | POA: Diagnosis present

## 2019-02-14 DIAGNOSIS — R296 Repeated falls: Secondary | ICD-10-CM | POA: Diagnosis present

## 2019-02-14 DIAGNOSIS — W19XXXA Unspecified fall, initial encounter: Secondary | ICD-10-CM | POA: Diagnosis present

## 2019-02-14 DIAGNOSIS — R4689 Other symptoms and signs involving appearance and behavior: Secondary | ICD-10-CM | POA: Diagnosis not present

## 2019-02-14 DIAGNOSIS — Z801 Family history of malignant neoplasm of trachea, bronchus and lung: Secondary | ICD-10-CM

## 2019-02-14 DIAGNOSIS — Z8249 Family history of ischemic heart disease and other diseases of the circulatory system: Secondary | ICD-10-CM

## 2019-02-14 DIAGNOSIS — G894 Chronic pain syndrome: Secondary | ICD-10-CM | POA: Diagnosis not present

## 2019-02-14 DIAGNOSIS — Z66 Do not resuscitate: Secondary | ICD-10-CM | POA: Diagnosis not present

## 2019-02-14 DIAGNOSIS — M545 Low back pain: Secondary | ICD-10-CM | POA: Diagnosis not present

## 2019-02-14 DIAGNOSIS — Z833 Family history of diabetes mellitus: Secondary | ICD-10-CM

## 2019-02-14 DIAGNOSIS — G9341 Metabolic encephalopathy: Secondary | ICD-10-CM | POA: Diagnosis not present

## 2019-02-14 DIAGNOSIS — N183 Chronic kidney disease, stage 3 unspecified: Secondary | ICD-10-CM | POA: Diagnosis present

## 2019-02-14 DIAGNOSIS — I5032 Chronic diastolic (congestive) heart failure: Secondary | ICD-10-CM | POA: Diagnosis present

## 2019-02-14 DIAGNOSIS — I10 Essential (primary) hypertension: Secondary | ICD-10-CM | POA: Diagnosis not present

## 2019-02-14 DIAGNOSIS — Z96612 Presence of left artificial shoulder joint: Secondary | ICD-10-CM | POA: Diagnosis not present

## 2019-02-14 DIAGNOSIS — S299XXA Unspecified injury of thorax, initial encounter: Secondary | ICD-10-CM | POA: Diagnosis not present

## 2019-02-14 DIAGNOSIS — Y92009 Unspecified place in unspecified non-institutional (private) residence as the place of occurrence of the external cause: Secondary | ICD-10-CM

## 2019-02-14 DIAGNOSIS — Z823 Family history of stroke: Secondary | ICD-10-CM

## 2019-02-14 DIAGNOSIS — Z981 Arthrodesis status: Secondary | ICD-10-CM | POA: Diagnosis not present

## 2019-02-14 DIAGNOSIS — T84216S Breakdown (mechanical) of internal fixation device of vertebrae, sequela: Secondary | ICD-10-CM

## 2019-02-14 DIAGNOSIS — N2889 Other specified disorders of kidney and ureter: Secondary | ICD-10-CM | POA: Diagnosis not present

## 2019-02-14 DIAGNOSIS — S81819A Laceration without foreign body, unspecified lower leg, initial encounter: Secondary | ICD-10-CM | POA: Diagnosis present

## 2019-02-14 DIAGNOSIS — S0990XA Unspecified injury of head, initial encounter: Secondary | ICD-10-CM | POA: Diagnosis not present

## 2019-02-14 DIAGNOSIS — Z87440 Personal history of urinary (tract) infections: Secondary | ICD-10-CM

## 2019-02-14 DIAGNOSIS — Y831 Surgical operation with implant of artificial internal device as the cause of abnormal reaction of the patient, or of later complication, without mention of misadventure at the time of the procedure: Secondary | ICD-10-CM | POA: Diagnosis present

## 2019-02-14 DIAGNOSIS — Z79899 Other long term (current) drug therapy: Secondary | ICD-10-CM

## 2019-02-14 DIAGNOSIS — Z87891 Personal history of nicotine dependence: Secondary | ICD-10-CM | POA: Diagnosis not present

## 2019-02-14 DIAGNOSIS — N179 Acute kidney failure, unspecified: Secondary | ICD-10-CM | POA: Diagnosis not present

## 2019-02-14 DIAGNOSIS — R011 Cardiac murmur, unspecified: Secondary | ICD-10-CM | POA: Diagnosis present

## 2019-02-14 DIAGNOSIS — I130 Hypertensive heart and chronic kidney disease with heart failure and stage 1 through stage 4 chronic kidney disease, or unspecified chronic kidney disease: Secondary | ICD-10-CM | POA: Diagnosis not present

## 2019-02-14 DIAGNOSIS — M1990 Unspecified osteoarthritis, unspecified site: Secondary | ICD-10-CM | POA: Diagnosis present

## 2019-02-14 DIAGNOSIS — Z7982 Long term (current) use of aspirin: Secondary | ICD-10-CM

## 2019-02-14 DIAGNOSIS — I959 Hypotension, unspecified: Secondary | ICD-10-CM | POA: Diagnosis not present

## 2019-02-14 DIAGNOSIS — Z86718 Personal history of other venous thrombosis and embolism: Secondary | ICD-10-CM

## 2019-02-14 DIAGNOSIS — Z20828 Contact with and (suspected) exposure to other viral communicable diseases: Secondary | ICD-10-CM | POA: Diagnosis present

## 2019-02-14 DIAGNOSIS — R102 Pelvic and perineal pain: Secondary | ICD-10-CM | POA: Diagnosis not present

## 2019-02-14 DIAGNOSIS — Z96651 Presence of right artificial knee joint: Secondary | ICD-10-CM | POA: Diagnosis not present

## 2019-02-14 DIAGNOSIS — N1831 Chronic kidney disease, stage 3a: Secondary | ICD-10-CM | POA: Diagnosis present

## 2019-02-14 DIAGNOSIS — Z803 Family history of malignant neoplasm of breast: Secondary | ICD-10-CM

## 2019-02-14 DIAGNOSIS — R0902 Hypoxemia: Secondary | ICD-10-CM | POA: Diagnosis not present

## 2019-02-14 DIAGNOSIS — T84498A Other mechanical complication of other internal orthopedic devices, implants and grafts, initial encounter: Secondary | ICD-10-CM | POA: Diagnosis not present

## 2019-02-14 DIAGNOSIS — S3993XA Unspecified injury of pelvis, initial encounter: Secondary | ICD-10-CM | POA: Diagnosis not present

## 2019-02-14 LAB — COMPREHENSIVE METABOLIC PANEL
ALT: 14 U/L (ref 0–44)
AST: 17 U/L (ref 15–41)
Albumin: 3.5 g/dL (ref 3.5–5.0)
Alkaline Phosphatase: 77 U/L (ref 38–126)
Anion gap: 13 (ref 5–15)
BUN: 40 mg/dL — ABNORMAL HIGH (ref 8–23)
CO2: 23 mmol/L (ref 22–32)
Calcium: 8.7 mg/dL — ABNORMAL LOW (ref 8.9–10.3)
Chloride: 100 mmol/L (ref 98–111)
Creatinine, Ser: 3.06 mg/dL — ABNORMAL HIGH (ref 0.44–1.00)
GFR calc Af Amer: 16 mL/min — ABNORMAL LOW (ref >60)
GFR calc non Af Amer: 14 mL/min — ABNORMAL LOW (ref >60)
Glucose, Bld: 142 mg/dL — ABNORMAL HIGH (ref 70–99)
Potassium: 4.6 mmol/L (ref 3.5–5.1)
Sodium: 136 mmol/L (ref 135–145)
Total Bilirubin: 0.5 mg/dL (ref 0.3–1.2)
Total Protein: 6.1 g/dL — ABNORMAL LOW (ref 6.5–8.1)

## 2019-02-14 LAB — CBC
HCT: 34.8 % — ABNORMAL LOW (ref 36.0–46.0)
Hemoglobin: 10.4 g/dL — ABNORMAL LOW (ref 12.0–15.0)
MCH: 29 pg (ref 26.0–34.0)
MCHC: 29.9 g/dL — ABNORMAL LOW (ref 30.0–36.0)
MCV: 96.9 fL (ref 80.0–100.0)
Platelets: 608 10*3/uL — ABNORMAL HIGH (ref 150–400)
RBC: 3.59 MIL/uL — ABNORMAL LOW (ref 3.87–5.11)
RDW: 14.4 % (ref 11.5–15.5)
WBC: 11.1 10*3/uL — ABNORMAL HIGH (ref 4.0–10.5)
nRBC: 0 % (ref 0.0–0.2)

## 2019-02-14 LAB — URINALYSIS, ROUTINE W REFLEX MICROSCOPIC
Bilirubin Urine: NEGATIVE
Glucose, UA: NEGATIVE mg/dL
Hgb urine dipstick: NEGATIVE
Ketones, ur: NEGATIVE mg/dL
Nitrite: POSITIVE — AB
Protein, ur: 30 mg/dL — AB
Specific Gravity, Urine: 1.011 (ref 1.005–1.030)
pH: 5 (ref 5.0–8.0)

## 2019-02-14 LAB — LACTIC ACID, PLASMA: Lactic Acid, Venous: 0.9 mmol/L (ref 0.5–1.9)

## 2019-02-14 MED ORDER — SODIUM CHLORIDE 0.9 % IV BOLUS
500.0000 mL | Freq: Once | INTRAVENOUS | Status: AC
  Administered 2019-02-14: 20:00:00 500 mL via INTRAVENOUS

## 2019-02-14 MED ORDER — OXYCODONE HCL 5 MG PO TABS: 15.0000 mg | ORAL_TABLET | ORAL | Status: DC | PRN

## 2019-02-14 MED ORDER — GABAPENTIN 300 MG PO CAPS
900.0000 mg | ORAL_CAPSULE | Freq: Every day | ORAL | Status: DC
  Administered 2019-02-15: 900 mg via ORAL
  Filled 2019-02-14: qty 3

## 2019-02-14 MED ORDER — ONDANSETRON HCL 4 MG PO TABS: 4.0000 mg | ORAL_TABLET | Freq: Four times a day (QID) | ORAL | Status: DC | PRN

## 2019-02-14 MED ORDER — ACETAMINOPHEN 325 MG PO TABS
650.0000 mg | ORAL_TABLET | Freq: Four times a day (QID) | ORAL | Status: DC | PRN
  Administered 2019-02-15: 22:00:00 650 mg via ORAL
  Filled 2019-02-14: qty 2

## 2019-02-14 MED ORDER — ONDANSETRON HCL 4 MG/2ML IJ SOLN: 4.0000 mg | Freq: Four times a day (QID) | INTRAMUSCULAR | Status: DC | PRN

## 2019-02-14 MED ORDER — SODIUM CHLORIDE 0.9 % IV SOLN: INTRAVENOUS | Status: DC

## 2019-02-14 MED ORDER — GABAPENTIN 300 MG PO CAPS
300.0000 mg | ORAL_CAPSULE | ORAL | Status: DC
  Administered 2019-02-15: 300 mg via ORAL
  Filled 2019-02-14: qty 1

## 2019-02-14 MED ORDER — SODIUM CHLORIDE 0.9 % IV SOLN
1.0000 g | INTRAVENOUS | Status: DC
  Administered 2019-02-15 – 2019-02-16 (×4): 1 g via INTRAVENOUS
  Filled 2019-02-14 (×3): qty 10

## 2019-02-14 MED ORDER — ENOXAPARIN SODIUM 40 MG/0.4ML ~~LOC~~ SOLN: 40.0000 mg | SUBCUTANEOUS | Status: DC

## 2019-02-14 MED ORDER — ALPRAZOLAM 0.25 MG PO TABS
0.5000 mg | ORAL_TABLET | Freq: Three times a day (TID) | ORAL | Status: DC | PRN
  Administered 2019-02-15: 14:00:00 0.5 mg via ORAL
  Filled 2019-02-14: qty 2

## 2019-02-14 MED ORDER — OXYCODONE HCL 5 MG PO TABS
10.0000 mg | ORAL_TABLET | ORAL | Status: DC | PRN
  Administered 2019-02-15: 10 mg via ORAL
  Filled 2019-02-14: qty 2

## 2019-02-14 MED ORDER — LIDOCAINE-EPINEPHRINE 1 %-1:100000 IJ SOLN
10.0000 mL | Freq: Once | INTRAMUSCULAR | Status: AC
  Administered 2019-02-14: 19:00:00 10 mL
  Filled 2019-02-14: qty 10

## 2019-02-14 MED ORDER — ACETAMINOPHEN 650 MG RE SUPP: 650.0000 mg | Freq: Four times a day (QID) | RECTAL | Status: DC | PRN

## 2019-02-14 NOTE — ED Triage Notes (Signed)
Patient arrives via [facility] due to falling at home. Per ems, patient fell and received a lacerating to left lower leg. Patient did not hit her head and no LOC. Patient reports some pain in left hip. Rates pain a 3/10. Does NOT take thinners.  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

## 2019-02-14 NOTE — H&P (Addendum)
History and Physical    Shirley [name] ERX:[id number] DOB: October 18, 1940 DOA: 02/14/2019  PCP: Ardith Dark, PA-C  Patient coming from: Home  I have personally briefly reviewed patient's old medical records in [place]  Chief Complaint: Fall  HPI: Shirley [name] is a 78 y.o. female with medical history significant of chronic low back pain and arthritis, recurrent falls, recurrent UTIs.  History obtained mostly from patient's daughter.  Patient has had 3 falls over the past 12 hours.  Fell in the middle of the night, fell again this morning, no significant injuries during these falls.  Patient has been more confused than normal, talking about her mother recently dying but this occurred several years ago.  Daughter explains that when she falls often it is usually a UTI.  No recent fever.   ED Course: Found to have AKI with creat 3.0 up from 1.2 in Aug, BUN 40.  WBC 11.1k,   Also has leg laceration - 9 sutures by EDP.  UA suggestive of UTI with positive nitrites, 25-50 WBC.   Review of Systems: As per HPI, otherwise all review of systems negative.  Past Medical History:  Diagnosis Date  . Allergy    vasomotor rhinitis  . Anemia   . Asthma    h/o asthma and bronchitis  . BV (bacterial vaginosis)    history of frequent  . Cervical spondylosis    herniated disk protruding @ C6-7, impinging cord and left axillary root sleeve.  . Chronic pain   . Closed olecranon fracture, left, initial encounter   . Depression   . DVT of upper extremity (deep vein thrombosis) ([place])    h/o left(after wrist fracture/surgery); no residual thrombus or phlebitis by [facility] July 2014  . Dyspnea    with exertion  . Edema   . Fibromyalgia    sees Dr.Phillips-pain management  . GERD (gastroesophageal reflux disease)    h/o  . Headache    marigraine- hormal - none in years  . Heart murmur   . Hypertension    never has been treated that daughter is aware  . Neuropathy   . Neuropathy     . Osteopenia    mild at hip (T-1.3)  . PAT (paroxysmal atrial tachycardia) ([place])   . Pneumonia    years ago  . Recurrent falls 04/16/2016  . Varicose veins   . 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 Eilleen Kempf @ [facility]  . Vision problem    wears glasses  . Vitamin D deficiency     Past Surgical History:  Procedure Laterality Date  . ABDOMINAL HYSTERECTOMY  1999   BSO for endometriosis  . APPENDECTOMY    . BACK SURGERY  age 5   ruptured disk L3-4   . CATARACT EXTRACTION  2000   B/L  . COLONOSCOPY    . FINGER SURGERY     left finger for tender rupture from fall.  Marland Kitchen FOOT SURGERY     multiple(at least 4 on right, 5 on left)-left Dr.Kerner([place])11/08,left Dr.Nunley-excision 2nd and 3rd mt heads w/artho and hammertoe surgery 4th and 5th 04/2006. left great toe IP fusion and revision of fusion of 2nd through 5th toes 12/2005. Right foot surgeries  Dr.Bednarz 04/2008 and 04/2009.  Marland Kitchen KNEE SURGERY Right  right knee replacement  . NECK SURGERY  2007   cervical  . ORIF ELBOW FRACTURE Left 03/22/2016   Procedure: OPEN REDUCTION INTERNAL FIXATION (ORIF) ELBOW/OLECRANON FRACTURE;  Surgeon: Altamese Cresskill, MD;  Location: [facility];  Service: Orthopedics;  Laterality: Left;  . ORIF ELBOW FRACTURE Left 04/15/2016   Procedure: OPEN REDUCTION INTERNAL FIXATION (ORIF) LEFT ELBOW/OLECRANON FRACTURE;  Surgeon: Altamese Pleasant View, MD;  Location: [facility];  Service: Orthopedics;  Laterality: Left;  . RADIOLOGY WITH ANESTHESIA Left 01/06/2016   Procedure: MRI LEFT HIP WITH AND WITHOUT;  Surgeon: Medication Radiologist, MD;  Location: [facility];  Service: Radiology;  Laterality: Left;  . REFRACTIVE SURGERY  2007   left eye  . REVERSE SHOULDER ARTHROPLASTY Left 12/31/2016   Procedure: REVERSE LEFT SHOULDER  ARTHROPLASTY;  Surgeon: Netta Cedars, MD;  Location: [place] [facility];  Service: Orthopedics;  Laterality: Left;  . SHOULDER SURGERY     x8 (4 on rt side and 4 on left side)  . SPINAL FUSION  6/09   Dr.Cohen  . STERIOD INJECTION Left 04/15/2016   Procedure: LEFT HIP STEROID INJECTION;  Surgeon: Altamese Poplar, MD;  Location: [facility];  Service: Orthopedics;  Laterality: Left;  . TONSILLECTOMY AND ADENOIDECTOMY    . TOTAL HIP ARTHROPLASTY Left 08/18/2016   Procedure: LEFT TOTAL HIP ARTHROPLASTY ANTERIOR APPROACH;  Surgeon: Gaynelle Arabian, MD;  Location: [facility];  Service: Orthopedics;  Laterality: Left;  . TRANSTHORACIC ECHOCARDIOGRAM  July 2012   Normal EF 60-65% , mild concentric LVH. Grade 2 diastolic dysfunction with elevated EDP. Massive left atrial dilation. Pulmonary pressures 30-40 mm Hg; aortic sclerosis but no stenosis. Moderate TR  . TRANSTHORACIC ECHOCARDIOGRAM  03/2016   EF 65-70%. Normal wall motion. GR 1 DD. Severe LA dilation. Mild to moderate TR with mild to moderately elevated PA pressures (44 mmHg) mild aortic calcification but no stenosis. No source of emboli   . WRIST SURGERY Left    2 surgeries left wrist after fracture(complicated by DVT)     reports that she quit smoking about 61 years ago. She has never used smokeless tobacco. She reports that she does not drink alcohol or use drugs.  Allergies  Allergen Reactions  . Buspirone Hcl     Headaches, twitching    Family History  Problem Relation Age of Onset  . Stroke Mother   . Osteoarthritis Mother        Died at age 73  . Heart disease Father   . Lung cancer Father   . Heart failure Father        Died at age 80  . Osteoporosis Sister   . Diabetes Brother   . Heart attack Brother 69  . Diabetes Brother   . Diabetes Brother   . Heart attack Brother 66  . Breast cancer Paternal Aunt   . Heart failure Maternal Grandmother        Died at age 46, unsure of her heart disease  . Heart failure Paternal Grandfather        Died  at age 56, unsure of cardiac disease.     Prior to Admission medications   Medication Sig Start Date End Date Taking? Authorizing Provider  acetaminophen (TYLENOL) 325 MG tablet Take 2 tablets (650 mg total) by mouth every 6 (six) hours as needed for mild pain, fever or headache (or Fever >/= 101). 09/30/18   Eugenie Filler, MD  ALPRAZolam Duanne Moron) 0.5 MG tablet Take 0.5-1 mg by mouth 3 (three) times daily as needed for  anxiety or sleep.     [provider]  amLODipine (NORVASC) 5 MG tablet Take 5 mg by mouth as needed. If blood pressure is under 130 09/01/18   [provider]  aspirin EC 81 MG tablet Take 81 mg by mouth daily.    [provider]  buPROPion (WELLBUTRIN XL) 300 MG 24 hr tablet Take 300 mg by mouth daily. 04/04/18   [provider]  Carboxymethylcellulose Sodium (MOISTURIZING LUBRICANT EYE OP) Place 1 drop into both eyes as needed (for dry eyes).     [provider]  Cholecalciferol (VITAMIN D3) 5000 units CAPS Take 5,000 Units by mouth 2 (two) times daily.    [provider]  CRANBERRY EXTRACT PO Take by mouth.    [provider]  FLUoxetine (PROZAC) 40 MG capsule Take 40 mg by mouth daily.  04/13/18   [provider]  fluticasone (FLONASE) 50 MCG/ACT nasal spray Place 2 sprays into both nostrils daily as needed for allergies or rhinitis.     [provider]  gabapentin (NEURONTIN) 300 MG capsule Take 300-900 mg by mouth See admin instructions. Take 300 mg by mouth in the morning, take 300 mg by mouth in the afternoon and take 900 mg by mouth at bedtime    [provider]  metoprolol tartrate (LOPRESSOR) 25 MG tablet Take 1 tablet (25 mg total) by mouth 2 (two) times daily. 03/24/16   Rosita Fire, MD  oxyCODONE (ROXICODONE) 15 MG immediate release tablet Take 15 mg by mouth every 4 (four) hours as needed for pain.  03/30/18   [provider]  PROAIR HFA 108 (90 Base) MCG/ACT  inhaler Take 2 puffs by mouth as needed. 07/16/18   [provider]  psyllium (METAMUCIL) 58.6 % powder Take 1 packet by mouth 3 (three) times daily.    [provider]  rosuvastatin (CRESTOR) 5 MG tablet Take 5 mg by mouth daily. 05/18/18   [provider]  torsemide (DEMADEX) 10 MG tablet Take 1 tablet (10 mg total) by mouth every other day as needed (for fluid). 09/30/18   Eugenie Filler, MD  vitamin B-12 (CYANOCOBALAMIN) 500 MCG tablet Take 500 mcg by mouth daily.    [provider]    Physical Exam: Vitals:   02/14/19 1939 02/14/19 1945 02/14/19 2000 02/14/19 2153  BP: 107/66 113/67 128/70 117/71  Pulse: 78   75  Resp: 16 20 19 18   Temp:      TempSrc:      SpO2: 99%   99%  Weight:      Height:        Constitutional: NAD, calm, comfortable Eyes: PERRL, lids and conjunctivae normal ENMT: Mucous membranes are moist. Posterior pharynx clear of any exudate or lesions.Normal dentition.  Neck: normal, supple, no masses, no thyromegaly Respiratory: clear to auscultation bilaterally, no wheezing, no crackles. Normal respiratory effort. No accessory muscle use.  Cardiovascular: Regular rate and rhythm, no murmurs / rubs / gallops. No extremity edema. 2+ pedal pulses. No carotid bruits.  Abdomen: no tenderness, no masses palpated. No hepatosplenomegaly. Bowel sounds positive.  Musculoskeletal: no clubbing / cyanosis. No joint deformity upper and lower extremities. Good ROM, no contractures. Normal muscle tone.  Skin: L leg dressing C/D/I, see EDP note for details on wound and repair. Neurologic: CN 2-12 grossly intact. Sensation intact, DTR normal. Strength 5/5 in all 4.  Psychiatric: Normal judgment and insight. Alert and oriented x 3. Normal mood.    Labs  on Admission: I have personally reviewed following labs and imaging studies  CBC: Recent Labs  Lab 02/14/19 1855  WBC 11.1*  HGB 10.4*  HCT 34.8*  MCV 96.9  PLT 932*   Basic Metabolic  Panel: Recent Labs  Lab 02/14/19 1855  NA 136  K 4.6  CL 100  CO2 23  GLUCOSE 142*  BUN 40*  CREATININE 3.06*  CALCIUM 8.7*   GFR: Estimated Creatinine Clearance: 14.3 mL/min (A) (by C-G formula based on SCr of 3.06 mg/dL (H)). Liver Function Tests: Recent Labs  Lab 02/14/19 1855  AST 17  ALT 14  ALKPHOS 77  BILITOT 0.5  PROT 6.1*  ALBUMIN 3.5   No results for input(s): LIPASE, AMYLASE in the last 168 hours. No results for input(s): AMMONIA in the last 168 hours. Coagulation Profile: No results for input(s): INR, PROTIME in the last 168 hours. Cardiac Enzymes: No results for input(s): CKTOTAL, CKMB, CKMBINDEX, TROPONINI in the last 168 hours. BNP (last 3 results) No results for input(s): PROBNP in the last 8760 hours. HbA1C: No results for input(s): HGBA1C in the last 72 hours. CBG: No results for input(s): GLUCAP in the last 168 hours. Lipid Profile: No results for input(s): CHOL, HDL, LDLCALC, TRIG, CHOLHDL, LDLDIRECT in the last 72 hours. Thyroid Function Tests: No results for input(s): TSH, T4TOTAL, FREET4, T3FREE, THYROIDAB in the last 72 hours. Anemia Panel: No results for input(s): VITAMINB12, FOLATE, FERRITIN, TIBC, IRON, RETICCTPCT in the last 72 hours. Urine analysis:    Component Value Date/Time   COLORURINE YELLOW 02/14/2019 2156   APPEARANCEUR HAZY (A) 02/14/2019 2156   LABSPEC 1.011 02/14/2019 2156   PHURINE 5.0 02/14/2019 2156   GLUCOSEU NEGATIVE 02/14/2019 2156   HGBUR NEGATIVE 02/14/2019 2156   [place] NEGATIVE 02/14/2019 2156   [place] NEGATIVE 02/14/2019 2156   PROTEINUR 30 (A) 02/14/2019 2156   NITRITE POSITIVE (A) 02/14/2019 2156   LEUKOCYTESUR MODERATE (A) 02/14/2019 2156    Radiological Exams on Admission: DG Chest 1 View  Result Date: 02/14/2019 CLINICAL DATA:  Altered behavior, low back pain, pelvic pain, distal LEFT lower leg laceration, fell at home today, history asthma, heart murmur, hypertension, pneumonia, spinal  surgery and scoliosis, former smoker, initial encounter EXAM: CHEST  1 VIEW COMPARISON:  09/28/2018 FINDINGS: Rotated to the RIGHT. Upper normal heart size. Tortuous aorta. Mediastinal contours and pulmonary vascularity grossly stable. RIGHT basilar atelectasis without infiltrate, pleural effusion or pneumothorax. Bones demineralized with note of prior LEFT shoulder replacement and thoracolumbar spinal fusion. Single screw noted at RIGHT infra glenoid region of scapula. Degenerative changes of RIGHT glenohumeral joint with chronic rotator cuff tear suspected. IMPRESSION: RIGHT basilar atelectasis. Electronically Signed   By: Lavonia Dana M.D.   On: 02/14/2019 20:52   DG Lumbar Spine Complete  Result Date: 02/14/2019 CLINICAL DATA:  Altered behavior, low back pain, pelvic pain, distal LEFT lower leg laceration, fell at home today, history asthma, heart murmur, hypertension, pneumonia, spinal surgery and scoliosis, former smoker, initial encounter EXAM: LUMBAR SPINE - COMPLETE 4+ VIEW COMPARISON:  None FINDINGS: Osseous demineralization. Prior thoracolumbar fusion extending from T10 into sacrum. Fracture of the RIGHT fusion rod inferior to the RIGHT S1 pedicle screw. Remaining hardware appears intact. Fixation hardware extends across RIGHT SI joint into RIGHT iliac bone. Dextroconvex scoliosis. No fracture, subluxation or bone destruction. Atherosclerotic calcifications aorta. IMPRESSION: Prior thoracolumbosacral fusion with fracture of the RIGHT spinal rod inferior to the RIGHT S1 pedicle screw. Dextroconvex scoliosis. Osseous demineralization without definite acute bony abnormality. Electronically  Signed   By: Lavonia Dana M.D.   On: 02/14/2019 20:51   DG Pelvis 1-2 Views  Result Date: 02/14/2019 CLINICAL DATA:  Altered behavior, low back pain, pelvic pain, distal LEFT lower leg laceration, fell at home today, history asthma, heart murmur, hypertension, pneumonia, spinal surgery and scoliosis, former smoker,  initial encounter EXAM: PELVIS - 1-2 VIEW COMPARISON:  None FINDINGS: Prior lumbo spinal fusion with a RIGHT-side screw extending into RIGHT iliac bone. Fracture of the RIGHT spinal rod inferior to the RIGHT S1 pedicle screw. Post LEFT total hip arthroplasty. Pelvis intact. No acute fracture, dislocation, or bone destruction identified. IMPRESSION: Fracture of the RIGHT spinal rod inferior to the RIGHT S1 pedicle screw. LEFT hip prosthesis. No acute fracture or dislocation identified. Electronically Signed   By: Lavonia Dana M.D.   On: 02/14/2019 20:54   DG Tibia/Fibula Left  Result Date: 02/14/2019 CLINICAL DATA:  Altered behavior, low back pain, pelvic pain, distal LEFT lower leg laceration, fell at home today, history asthma, heart murmur, hypertension, pneumonia, spinal surgery and scoliosis, former smoker, initial encounter EXAM: LEFT TIBIA AND FIBULA - 2 VIEW COMPARISON:  None FINDINGS: Osseous demineralization. Knee and ankle joint alignments normal. Chondrocalcinosis LEFT knee question CPPD. No acute fracture, dislocation, or bone destruction. Dorsal hardware at mid LEFT foot question TMT fusion. IMPRESSION: No acute osseous abnormalities. Electronically Signed   By: Lavonia Dana M.D.   On: 02/14/2019 20:58   CT Head  Result Date: 02/14/2019 CLINICAL DATA:  Fall, head trauma EXAM: CT HEAD WITHOUT CONTRAST TECHNIQUE: Contiguous axial images were obtained from the base of the skull through the vertex without intravenous contrast. COMPARISON:  September 28, 2018 FINDINGS: Brain: No evidence of acute territorial infarction, hemorrhage, hydrocephalus,extra-axial collection or mass lesion/mass effect. Prior left parietooccipital area encephalomalacia. There is dilatation the ventricles and sulci consistent with age-related atrophy. Low-attenuation changes in the deep white matter consistent with small vessel ischemia. Vascular: No hyperdense vessel or unexpected calcification. Skull: The skull is intact. No  fracture or focal lesion identified. Sinuses/Orbits: The visualized paranasal sinuses and mastoid air cells are clear. The orbits and globes intact. Other: None IMPRESSION: No acute intracranial abnormality. Findings consistent with age related atrophy and chronic small vessel ischemia Prior left parietooccipital lobe infarct. Electronically Signed   By: Prudencio Pair M.D.   On: 02/14/2019 20:10    EKG: Independently reviewed.  Assessment/Plan Principal Problem:   AKI (acute kidney injury) ([place]) Active Problems:   Acute lower UTI   Acute metabolic encephalopathy   Fall at home, initial encounter   CKD (chronic kidney disease), stage III   Chronic diastolic CHF (congestive heart failure) ([place])   Fracture of hardware in spine ([facility])   Leg laceration    1. AKI on CKD stage 3- 1. Pre renal due to UTI? 2. Hold diuretics 3. IVF: NS at 125 cc/hr, got 500 cc bolus in ED 4. Renal US 5. Strict intake and output 6. Repeat BMP in AM 2. UTI - 1. Rocephin 2. UCx pending 3. Given UTI frequency, may benefit from urology follow up 3. Acute encephalopathy - 1. Secondary to UTI as well as AKI 4. HTN - 1. BP running on soft side here 2. Hold home BP meds for now 5. Chronic diastolic CHF - 1. Hold diuretics 2. Watch for fluid overload with IVF 6. Leg lac - 1. Sutured in ED by EDP 2. Wound care consult 7. Fracture of connecting rod from S1 screw to iliac bone - 1.  Chronic, this was also seen on CTs of L spine back in 2017 as well as 2011 2. Likely can be followed up outpt since this has been present for the past 9 years (at a minimum). 8. Chronic pain, anxiety - 1. Continue neurontin 2. Continue PRN xanax 3. Continue PRN oxycodone but try lower dose since she may be retaining due to AKI (will write as 10-15mg  instead of just 15mg  PRN)  DVT prophylaxis: Lovenox Code Status: DNR - confirmed with daughter Family Communication: Daughter on phone Disposition Plan: TBD Consults called:  None Admission status: Admit to inpatient  Severity of Illness: The appropriate patient status for this patient is INPATIENT. Inpatient status is judged to be reasonable and necessary in order to provide the required intensity of service to ensure the patient's safety. The patient's presenting symptoms, physical exam findings, and initial radiographic and laboratory data in the context of their chronic comorbidities is felt to place them at high risk for further clinical deterioration. Furthermore, it is not anticipated that the patient will be medically stable for discharge from the hospital within 2 midnights of admission. The following factors support the patient status of inpatient.   IP status due to AKI.   * I certify that at the point of admission it is my clinical judgment that the patient will require inpatient hospital care spanning beyond 2 midnights from the point of admission due to high intensity of service, high risk for further deterioration and high frequency of surveillance required.*    Pranish Akhavan M. DO [facility]  How to contact the [facility] - Inpatient Attending or Consulting provider [place] or covering provider during after hours [place], for this patient?  1. Check the care team in [facility] and look for a) attending/consulting [facility] provider listed and b) the [facility] team listed 2. Log into [email or web address]  Amion Physician Scheduling and messaging for groups and whole hospitals  On call and physician scheduling software for group practices, residents, hospitalists and other medical providers for call, clinic, rotation and shift schedules. On[employer] is a hospital-wide system for scheduling doctors and paging doctors on call. EasyPlot is for scientific plotting and data analysis.  [email or web address]  and use [facility]'s universal password to access. If you do not have the password, please contact the hospital operator.  3. Locate the [facility] provider you are looking for under [facility] and page to a number that you can be directly reached. 4. If you still have difficulty reaching the provider, please page the [facility] (Director on Call) for the Hospitalists listed on amion for assistance.  02/14/2019, 10:42 PM

## 2019-02-14 NOTE — ED Notes (Signed)
Pt placed on purewick 

## 2019-02-14 NOTE — ED Provider Notes (Signed)
[facility] Emergency Department Provider Note MRN:  [id number]  Arrival date & time: 02/14/19     Chief Complaint   Fall   History of Present Illness   Shirley [name] is a 78 y.o. year-old female with a history of DVT, dementia presenting to the ED with chief complaint of fall.  History obtained mostly from patient's daughter.  Patient has had 3 falls over the past 12 hours.  Fell in the middle of the night, fell again this morning, no significant injuries during these falls.  Patient has been more confused than normal, talking about her mother recently dying but this occurred several years ago.  Daughter explains that when she falls often it is usually a UTI.  No recent fever.  I was unable to obtain an accurate HPI, PMH, or ROS due to the patient's altered mental status, dementia.  Level 5 caveat.  Review of Systems  Positive for altered mental status, fall, laceration, dementia  Patient's Health History    Past Medical History:  Diagnosis Date  . Allergy    vasomotor rhinitis  . Anemia   . Asthma    h/o asthma and bronchitis  . BV (bacterial vaginosis)    history of frequent  . Cervical spondylosis    herniated disk protruding @ C6-7, impinging cord and left axillary root sleeve.  . Chronic pain   . Closed olecranon fracture, left, initial encounter   . Depression   . DVT of upper extremity (deep vein thrombosis) ([place])    h/o left(after wrist fracture/surgery); no residual thrombus or phlebitis by Do[place] July 2014  . Dyspnea    with exertion  . Edema   . Fibromyalgia    sees Dr.Phillips-pain management  . GERD (gastroesophageal reflux disease)    h/o  . Headache    marigraine- hormal - none in years  . Heart murmur   . Hypertension    never has been treated that daughter is aware  . Neuropathy   . Neuropathy   . Osteopenia    mild at hip (T-1.3)  . PAT (paroxysmal atrial tachycardia) ([place])   . Pneumonia    years ago  . Recurrent  falls 04/16/2016  . Varicose veins   . 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 Eilleen Kempf @ [facility]  . Vision problem    wears glasses  . Vitamin D deficiency     Past Surgical History:  Procedure Laterality Date  . ABDOMINAL HYSTERECTOMY  1999   BSO for endometriosis  . APPENDECTOMY    . BACK SURGERY  age 72   ruptured disk L3-4   . CATARACT EXTRACTION  2000   B/L  . COLONOSCOPY    . FINGER SURGERY     left finger for tender rupture from fall.  Marland Kitchen FOOT SURGERY     multiple(at least 4 on right, 5 on left)-left Dr.Kerner([place])11/08,left Dr.Nunley-excision 2nd and 3rd mt heads w/artho and hammertoe surgery 4th and 5th 04/2006. left great toe IP fusion and revision of fusion of 2nd through 5th toes 12/2005. Right foot surgeries  Dr.Bednarz 04/2008 and 04/2009.  Marland Kitchen KNEE SURGERY Right    right knee replacement  . NECK SURGERY  2007   cervical  . ORIF ELBOW FRACTURE Left 03/22/2016   Procedure:  OPEN REDUCTION INTERNAL FIXATION (ORIF) ELBOW/OLECRANON FRACTURE;  Surgeon: Altamese Logan, MD;  Location: [place];  Service: Orthopedics;  Laterality: Left;  . ORIF ELBOW FRACTURE Left 04/15/2016   Procedure: OPEN REDUCTION INTERNAL FIXATION (ORIF) LEFT ELBOW/OLECRANON FRACTURE;  Surgeon: Altamese Whitwell, MD;  Location: [place];  Service: Orthopedics;  Laterality: Left;  . RADIOLOGY WITH ANESTHESIA Left 01/06/2016   Procedure: MRI LEFT HIP WITH AND WITHOUT;  Surgeon: Medication Radiologist, MD;  Location: [place];  Service: Radiology;  Laterality: Left;  . REFRACTIVE SURGERY  2007   left eye  . REVERSE SHOULDER ARTHROPLASTY Left 12/31/2016   Procedure: REVERSE LEFT SHOULDER ARTHROPLASTY;  Surgeon: Netta Cedars, MD;  Location: [place];  Service: Orthopedics;  Laterality: Left;  . SHOULDER SURGERY     x8 (4  on rt side and 4 on left side)  . SPINAL FUSION  6/09   Dr.Cohen  . STERIOD INJECTION Left 04/15/2016   Procedure: LEFT HIP STEROID INJECTION;  Surgeon: Altamese Dyckesville, MD;  Location: [place];  Service: Orthopedics;  Laterality: Left;  . TONSILLECTOMY AND ADENOIDECTOMY    . TOTAL HIP ARTHROPLASTY Left 08/18/2016   Procedure: LEFT TOTAL HIP ARTHROPLASTY ANTERIOR APPROACH;  Surgeon: Gaynelle Arabian, MD;  Location: [facility];  Service: Orthopedics;  Laterality: Left;  . TRANSTHORACIC ECHOCARDIOGRAM  July 2012   Normal EF 60-65% , mild concentric LVH. Grade 2 diastolic dysfunction with elevated EDP. Massive left atrial dilation. Pulmonary pressures 30-40 mm Hg; aortic sclerosis but no stenosis. Moderate TR  . TRANSTHORACIC ECHOCARDIOGRAM  03/2016   EF 65-70%. Normal wall motion. GR 1 DD. Severe LA dilation. Mild to moderate TR with mild to moderately elevated PA pressures (44 mmHg) mild aortic calcification but no stenosis. No source of emboli   . WRIST SURGERY Left    2 surgeries left wrist after fracture(complicated by DVT)    Family History  Problem Relation Age of Onset  . Stroke Mother   . Osteoarthritis Mother        Died at age 62  . Heart disease Father   . Lung cancer Father   . Heart failure Father        Died at age 33  . Osteoporosis Sister   . Diabetes Brother   . Heart attack Brother 30  . Diabetes Brother   . Diabetes Brother   . Heart attack Brother 61  . Breast cancer Paternal Aunt   . Heart failure Maternal Grandmother        Died at age 59, unsure of her heart disease  . Heart failure Paternal Grandfather        Died at age 80, unsure of cardiac disease.    Social History   Socioeconomic History  . Marital status: Married    Spouse name: [place]  . Number of children: Not on file  . Years of education: Not on file  . Highest education level: Not on file  Occupational History  . Not on file  Tobacco Use  . Smoking status: Former Smoker    Quit date: 02/22/1958     Years since quitting: 61.0  . Smokeless tobacco: Never Used  . Tobacco comment: doesn't smoke every day  Substance and Sexual Activity  . Alcohol use: No    Alcohol/week: 0.0 standard drinks  . Drug use: No  . Sexual activity: Not on file  Other Topics Concern  . Not on file  Social History Narrative   Lives with husband.  3 children ([place], [place], [place]),  5 grandchildren.  Education trade school.  Retired.  Caffeine 2 x daily.     Social Determinants of Health   Financial Resource Strain:   . Difficulty of Paying Living Expenses: Not on file  Food Insecurity:   . Worried About Charity fundraiser in the Last Year: Not on file  . Ran Out of Food in the Last Year: Not on file  Transportation Needs:   . Lack of Transportation (Medical): Not on file  . Lack of Transportation (Non-Medical): Not on file  Physical Activity:   . Days of Exercise per Week: Not on file  . Minutes of Exercise per Session: Not on file  Stress:   . Feeling of Stress : Not on file  Social Connections:   . Frequency of Communication with Friends and Family: Not on file  . Frequency of Social Gatherings with Friends and Family: Not on file  . Attends Religious Services: Not on file  . Active Member of Clubs or Organizations: Not on file  . Attends Archivist Meetings: Not on file  . Marital Status: Not on file  Intimate Partner Violence:   . Fear of Current or Ex-Partner: Not on file  . Emotionally Abused: Not on file  . Physically Abused: Not on file  . Sexually Abused: Not on file     Physical Exam  Vital Signs and Nursing Notes reviewed Vitals:   02/14/19 1939 02/14/19 1939  BP:  107/66  Pulse:  78  Resp: 18 16  Temp:    SpO2:  99%    CONSTITUTIONAL: Chronically ill-appearing, NAD NEURO:  Alert and oriented x 3, no focal deficits EYES:  eyes equal and reactive ENT/NECK:  no LAD, no JVD CARDIO: Regular rate, well-perfused, normal S1 and S2 PULM:  CTAB no wheezing or  rhonchi GI/GU:  normal bowel sounds, non-distended, non-tender MSK/SPINE:  No gross deformities, no edema SKIN:  no rash, linear laceration to the left medial tib-fib region [facility]:  Appropriate speech and behavior  Diagnostic and Interventional Summary    EKG Interpretation  Date/Time:  Wednesday February 14 2019 18:57:06 EST Ventricular Rate:  73 PR Interval:    QRS Duration: 96 QT Interval:  403 QTC Calculation: 445 R Axis:   58 Text Interpretation: Sinus rhythm Left atrial enlargement Confirmed by Gerlene Fee [phone]) on 02/14/2019 9:17:20 PM      Labs Reviewed  CBC - Abnormal; Notable for the following components:      Result Value   WBC 11.1 (*)    RBC 3.59 (*)    Hemoglobin 10.4 (*)    HCT 34.8 (*)    MCHC 29.9 (*)    Platelets 608 (*)    All other components within normal limits  COMPREHENSIVE METABOLIC PANEL - Abnormal; Notable for the following components:   Glucose, Bld 142 (*)    BUN 40 (*)    Creatinine, Ser 3.06 (*)    Calcium 8.7 (*)    Total Protein 6.1 (*)    GFR calc non Af Amer 14 (*)    GFR calc Af Amer 16 (*)    All other components within normal limits  SARS CORONAVIRUS 2 (TAT 6-24 HRS)  LACTIC ACID, PLASMA  URINALYSIS, ROUTINE W REFLEX MICROSCOPIC    DG Lumbar Spine Complete  Final Result    DG Pelvis 1-2 Views  Final Result    DG Tibia/Fibula Left  Final Result    DG Chest 1 View  Final Result  CT Head  Final Result      Medications  sodium chloride 0.9 % bolus 500 mL (0 mLs Intravenous Stopped 02/14/19 2041)  lidocaine-EPINEPHrine (XYLOCAINE W/EPI) 1 %-1:100000 (with pres) injection 10 mL (10 mLs Other Given by Other 02/14/19 1920)     Procedures  /  Critical Care .Marland KitchenLaceration Repair  Date/Time: 02/14/2019 7:36 PM Performed by: Maudie Flakes, MD Authorized by: Maudie Flakes, MD   Consent:    Consent obtained:  Emergent situation Anesthesia (see MAR for exact dosages):    Anesthesia method:  Local infiltration    Local anesthetic:  Lidocaine 1% WITH epi Laceration details:    Location:  Leg   Leg location:  L lower leg   Length (cm):  5   Depth (mm):  3 Repair type:    Repair type:  Simple Pre-procedure details:    Preparation:  Patient was prepped and draped in usual sterile fashion Exploration:    Wound exploration: wound explored through full range of motion and entire depth of wound probed and visualized     Contaminated: no   Treatment:    Area cleansed with:  Saline   Amount of cleaning:  Extensive   Irrigation solution:  Sterile water   Irrigation method:  Syringe Skin repair:    Repair method:  Sutures   Suture size:  4-0   Suture material:  Chromic gut   Suture technique:  Simple interrupted   Number of sutures:  9 Approximation:    Approximation:  Close Post-procedure details:    Dressing:  Non-adherent dressing   Patient tolerance of procedure:  Tolerated well, no immediate complications    ED Course and Medical Decision Making  I have reviewed the triage vital signs and the nursing notes.  Pertinent labs & imaging results that were available during my care of the patient were reviewed by me and considered in my medical decision making (see below for details).     Patient arrives afebrile, soft blood pressure compared to priors on record, no increased work of breathing, no hypoxia, laceration to the leg but otherwise without significant evidence of trauma elsewhere.  Frequent falls at home with altered mental status, considering metabolic disarray, UTI, subdural.  Work-up pending.  Work-up reveals AKI, spinal rod fracture of the lumbar spine.  Will admit to hospital service, will consult orthopedics.  Patient is neurologically intact.  Barth Kirks. Sedonia Small, MD [place] [email or web address]  Final Clinical Impressions(s) / ED Diagnoses     ICD-10-CM   1. Fall, initial encounter  W19.XXXA   2. Altered behavior  R46.89 DG  Chest 1 View    DG Chest 1 View    CANCELED: XR Chest Single View    CANCELED: XR Chest Single View  3. Acute kidney injury ([place])  N17.9   4. Laceration of left lower extremity, initial encounter  S81.812A     ED Discharge Orders    None       Discharge Instructions Discussed with and Provided to Patient:   Discharge Instructions   None       Maudie Flakes, MD 02/14/19 2124

## 2019-02-14 NOTE — ED Notes (Signed)
Pt to CT

## 2019-02-14 NOTE — ED Notes (Signed)
DaughterAnderson Malta [phone] would like an update

## 2019-02-15 ENCOUNTER — Encounter (HOSPITAL_COMMUNITY): Payer: Self-pay | Admitting: Internal Medicine

## 2019-02-15 LAB — CBC
HCT: 30.3 % — ABNORMAL LOW (ref 36.0–46.0)
Hemoglobin: 9.4 g/dL — ABNORMAL LOW (ref 12.0–15.0)
MCH: 28.9 pg (ref 26.0–34.0)
MCHC: 31 g/dL (ref 30.0–36.0)
MCV: 93.2 fL (ref 80.0–100.0)
Platelets: 569 10*3/uL — ABNORMAL HIGH (ref 150–400)
RBC: 3.25 MIL/uL — ABNORMAL LOW (ref 3.87–5.11)
RDW: 14.1 % (ref 11.5–15.5)
WBC: 8.3 10*3/uL (ref 4.0–10.5)
nRBC: 0 % (ref 0.0–0.2)

## 2019-02-15 LAB — VITAMIN B12: Vitamin B-12: 657 pg/mL (ref 180–914)

## 2019-02-15 LAB — AMMONIA: Ammonia: 21 umol/L (ref 9–35)

## 2019-02-15 LAB — BASIC METABOLIC PANEL
Anion gap: 13 (ref 5–15)
BUN: 39 mg/dL — ABNORMAL HIGH (ref 8–23)
CO2: 22 mmol/L (ref 22–32)
Calcium: 8.7 mg/dL — ABNORMAL LOW (ref 8.9–10.3)
Chloride: 102 mmol/L (ref 98–111)
Creatinine, Ser: 2.81 mg/dL — ABNORMAL HIGH (ref 0.44–1.00)
GFR calc Af Amer: 18 mL/min — ABNORMAL LOW (ref >60)
GFR calc non Af Amer: 15 mL/min — ABNORMAL LOW (ref >60)
Glucose, Bld: 124 mg/dL — ABNORMAL HIGH (ref 70–99)
Potassium: 4.1 mmol/L (ref 3.5–5.1)
Sodium: 137 mmol/L (ref 135–145)

## 2019-02-15 LAB — TSH: TSH: 1.109 u[IU]/mL (ref 0.350–4.500)

## 2019-02-15 LAB — MRSA PCR SCREENING: MRSA by PCR: NEGATIVE

## 2019-02-15 LAB — SARS CORONAVIRUS 2 (TAT 6-24 HRS): SARS Coronavirus 2: NEGATIVE

## 2019-02-15 LAB — T4, FREE: Free T4: 1.17 ng/dL — ABNORMAL HIGH (ref 0.61–1.12)

## 2019-02-15 MED ORDER — GABAPENTIN 100 MG PO CAPS: 100.0000 mg | ORAL_CAPSULE | ORAL | Status: DC

## 2019-02-15 MED ORDER — ENOXAPARIN SODIUM 30 MG/0.3ML ~~LOC~~ SOLN
30.0000 mg | SUBCUTANEOUS | Status: DC
  Administered 2019-02-15 – 2019-02-16 (×2): 30 mg via SUBCUTANEOUS
  Filled 2019-02-15 (×2): qty 0.3

## 2019-02-15 MED ORDER — QUETIAPINE FUMARATE 25 MG PO TABS
12.5000 mg | ORAL_TABLET | Freq: Two times a day (BID) | ORAL | Status: DC
  Administered 2019-02-15 – 2019-02-16 (×2): 12.5 mg via ORAL
  Filled 2019-02-15 (×2): qty 1

## 2019-02-15 MED ORDER — OXYCODONE HCL 5 MG PO TABS
5.0000 mg | ORAL_TABLET | ORAL | Status: DC | PRN
  Administered 2019-02-16: 11:00:00 5 mg via ORAL
  Filled 2019-02-15: qty 1

## 2019-02-15 MED ORDER — GABAPENTIN 100 MG PO CAPS
100.0000 mg | ORAL_CAPSULE | Freq: Two times a day (BID) | ORAL | Status: DC
  Administered 2019-02-15 – 2019-02-16 (×2): 100 mg via ORAL
  Filled 2019-02-15 (×2): qty 1

## 2019-02-15 MED ORDER — GABAPENTIN 300 MG PO CAPS: 600.0000 mg | ORAL_CAPSULE | Freq: Every day | ORAL | Status: DC

## 2019-02-15 MED ORDER — ALPRAZOLAM 0.5 MG PO TABS
0.5000 mg | ORAL_TABLET | Freq: Three times a day (TID) | ORAL | Status: DC | PRN
  Administered 2019-02-15: 0.5 mg via ORAL
  Filled 2019-02-15: qty 1

## 2019-02-15 NOTE — Consult Note (Signed)
WOC Nurse Consult Note: Patient receiving care in [facility] 5N06.  Consult completed remotely after review of record. Photo of area not available. Reason for Consult: "leg lac" Wound type: trauma injury to leg requiring suturing in the ED. Pressure Injury POA: Yes/No/NA Measurement: Wound bed: Drainage (amount, consistency, odor)  Periwound: Dressing procedure/placement/frequency:  Place a Xeroform gauze Kellie Simmering [phone]) over the leg laceration, top with an ABD pad, secure with kerlex. Change daily. Monitor the wound area(s) for worsening of condition such as: Signs/symptoms of infection,  Increase in size,  Development of or worsening of odor, Development of pain, or increased pain at the affected locations.  Notify the medical team if any of these develop.  Thank you for the consult. [place] nurse will not follow at this time.  Please re-consult the [facility] team if needed.  Val Riles, RN, MSN, CWOCN, CNS-BC, pager [phone]

## 2019-02-15 NOTE — Plan of Care (Signed)
  Problem: Skin Integrity: Goal: Risk for impaired skin integrity will decrease Outcome: Progressing   Problem: Safety: Goal: Ability to remain free from injury will improve Outcome: Progressing   Problem: Pain Managment: Goal: General experience of comfort will improve Outcome: Progressing   Problem: Elimination: Goal: Will not experience complications related to bowel motility Outcome: Progressing

## 2019-02-15 NOTE — Progress Notes (Signed)
[facility] Progress Note  Patient: Shirley [name] NIO:[id number]   PCP: Ardith Dark, PA-C DOB: [birth year]   DOA: 02/14/2019   DOS: 02/15/2019   Date of Service: the patient was seen and examined on 02/15/2019  Chief Complaint  Patient presents with   Fall   Brief hospital course: Pt. with PMH of chronic low back pain, arthritis, recurrent fall, recurrent UTI; presented with complain of fall, was found to have acute kidney injury from poor p.o. intake.  Currently further plan is continue aggressive IV hydration as well as monitor renal function and monitor acute encephalopathy.  Subjective: Significantly confused.  Unable to get out of the bed.  Talks incoherently.  No nausea or vomiting.  Assessment and Plan: Scheduled Meds:  enoxaparin (LOVENOX) injection  30 mg Subcutaneous Q24H   gabapentin  100 mg Oral BID   QUEtiapine  12.5 mg Oral BID   Continuous Infusions:  sodium chloride 50 mL/hr at 02/15/19 1513   cefTRIAXone (ROCEPHIN)  IV 1 g (02/15/19 0155)   PRN Meds: acetaminophen **OR** acetaminophen, ALPRAZolam, ondansetron **OR** ondansetron (ZOFRAN) IV, oxyCODONE  1.  Acute kidney injury on chronic kidney disease stage IIIa Suspecting prerenal from poor p.o. intake. Holding diuretics. Continuing IV hydration.  General addition the rate. Renal ultrasound negative for any obstructive uropathy.  Side avoid nephrotoxic medications. Renal dosing of all the medication per pharmacy.  2.  Suspected UTI. UA shows evidence of pyuria as well as nitrite positive. Urine culture pending. No obstructive uropathy per Continue with IV ceftriaxone.  3.  Acute metabolic encephalopathy. Chronic anxiety  chronic pain syndrome. Delirium in the setting of UTI as well as AKI as well as polypharmacy. QTC is normal therefore will attempt Seroquel. Patient is on Xanax.  We will reduce the dose. Patient is on oxycodone we will reduce the dose of that as well. Patient is on  gabapentin we will reduce the dose of that as well.  4.  Essential hypertension blood HFpEF. Holding diuretics. Blood pressure actually soft holding blood pressure medications.  5.  Leg laceration. Sutured in the ED by EDP. Appreciate wound care assistance. Xeroform gauze.  Diet: Cardiac diet  DVT Prophylaxis: Subcutaneous Lovenox   Advance goals of care discussion: DNR  Family Communication: no family was present at bedside, at the time of interview.   Disposition:  Discharge to be determined.  PT OT consulted  Consultants: none Procedures: none  Antibiotics: Anti-infectives (From admission, onward)   Start     Dose/Rate Route Frequency Ordered Stop   02/14/19 2245  cefTRIAXone (ROCEPHIN) 1 g in sodium chloride 0.9 % 100 mL IVPB     1 g 200 mL/hr over 30 Minutes Intravenous Every 24 hours 02/14/19 2241         Objective: Physical Exam: Vitals:   02/15/19 0000 02/15/19 0129 02/15/19 0405 02/15/19 1500  BP: (!) 114/98 119/89 (!) 99/56 134/62  Pulse:  70 71 91  Resp: 19 18 16 15   Temp:   97.9 F (36.6 C) 98.3 F (36.8 C)  TempSrc:   Oral Oral  SpO2:  100% 95% 94%  Weight:      Height:        Intake/Output Summary (Last 24 hours) at 02/15/2019 1622 Last data filed at 02/15/2019 1333 Gross per 24 hour  Intake 1160 ml  Output 950 ml  Net 210 ml   Filed Weights   02/14/19 1738  Weight: 72.6 kg   General: alert and not oriented to time, place,  and person. Appear in moderate distress, affect emotionally labile Eyes: PERRL, Conjunctiva normal ENT: Oral Mucosa Clear, dry  Neck: no JVD, no Abnormal Mass Or lumps Cardiovascular: S1 and S2 Present, no Murmur,  Respiratory: good respiratory effort, Bilateral Air entry equal and Decreased, no signs of accessory muscle use, Clear to Auscultation, no Crackles, no wheezes Abdomen: Bowel Sound present, Soft and no tenderness, no hernia Skin: no rashes  Extremities: no Pedal edema, no calf tenderness Neurologic:  without any new focal findings negative asterixis. Gait not checked due to patient safety concerns  Data Reviewed: I have personally reviewed and interpreted daily labs, tele strips, imagings as discussed above. I reviewed all nursing notes, pharmacy notes, vitals, pertinent old records I have discussed plan of care as described above with RN and patient/family.  CBC: Recent Labs  Lab 02/14/19 1855 02/15/19 0434  WBC 11.1* 8.3  HGB 10.4* 9.4*  HCT 34.8* 30.3*  MCV 96.9 93.2  PLT 608* 384*   Basic Metabolic Panel: Recent Labs  Lab 02/14/19 1855 02/15/19 0434  NA 136 137  K 4.6 4.1  CL 100 102  CO2 23 22  GLUCOSE 142* 124*  BUN 40* 39*  CREATININE 3.06* 2.81*  CALCIUM 8.7* 8.7*    Liver Function Tests: Recent Labs  Lab 02/14/19 1855  AST 17  ALT 14  ALKPHOS 77  BILITOT 0.5  PROT 6.1*  ALBUMIN 3.5   No results for input(s): LIPASE, AMYLASE in the last 168 hours. No results for input(s): AMMONIA in the last 168 hours. Coagulation Profile: No results for input(s): INR, PROTIME in the last 168 hours. Cardiac Enzymes: No results for input(s): CKTOTAL, CKMB, CKMBINDEX, TROPONINI in the last 168 hours. BNP (last 3 results) No results for input(s): PROBNP in the last 8760 hours. CBG: No results for input(s): GLUCAP in the last 168 hours. Studies: DG Chest 1 View  Result Date: 02/14/2019 CLINICAL DATA:  Altered behavior, low back pain, pelvic pain, distal LEFT lower leg laceration, fell at home today, history asthma, heart murmur, hypertension, pneumonia, spinal surgery and scoliosis, former smoker, initial encounter EXAM: CHEST  1 VIEW COMPARISON:  09/28/2018 FINDINGS: Rotated to the RIGHT. Upper normal heart size. Tortuous aorta. Mediastinal contours and pulmonary vascularity grossly stable. RIGHT basilar atelectasis without infiltrate, pleural effusion or pneumothorax. Bones demineralized with note of prior LEFT shoulder replacement and thoracolumbar spinal fusion.  Single screw noted at RIGHT infra glenoid region of scapula. Degenerative changes of RIGHT glenohumeral joint with chronic rotator cuff tear suspected. IMPRESSION: RIGHT basilar atelectasis. Electronically Signed   By: Lavonia Dana M.D.   On: 02/14/2019 20:52   DG Lumbar Spine Complete  Result Date: 02/14/2019 CLINICAL DATA:  Altered behavior, low back pain, pelvic pain, distal LEFT lower leg laceration, fell at home today, history asthma, heart murmur, hypertension, pneumonia, spinal surgery and scoliosis, former smoker, initial encounter EXAM: LUMBAR SPINE - COMPLETE 4+ VIEW COMPARISON:  None FINDINGS: Osseous demineralization. Prior thoracolumbar fusion extending from T10 into sacrum. Fracture of the RIGHT fusion rod inferior to the RIGHT S1 pedicle screw. Remaining hardware appears intact. Fixation hardware extends across RIGHT SI joint into RIGHT iliac bone. Dextroconvex scoliosis. No fracture, subluxation or bone destruction. Atherosclerotic calcifications aorta. IMPRESSION: Prior thoracolumbosacral fusion with fracture of the RIGHT spinal rod inferior to the RIGHT S1 pedicle screw. Dextroconvex scoliosis. Osseous demineralization without definite acute bony abnormality. Electronically Signed   By: Lavonia Dana M.D.   On: 02/14/2019 20:51   DG Pelvis 1-2 Views  Result Date: 02/14/2019 CLINICAL DATA:  Altered behavior, low back pain, pelvic pain, distal LEFT lower leg laceration, fell at home today, history asthma, heart murmur, hypertension, pneumonia, spinal surgery and scoliosis, former smoker, initial encounter EXAM: PELVIS - 1-2 VIEW COMPARISON:  None FINDINGS: Prior lumbo spinal fusion with a RIGHT-side screw extending into RIGHT iliac bone. Fracture of the RIGHT spinal rod inferior to the RIGHT S1 pedicle screw. Post LEFT total hip arthroplasty. Pelvis intact. No acute fracture, dislocation, or bone destruction identified. IMPRESSION: Fracture of the RIGHT spinal rod inferior to the RIGHT S1  pedicle screw. LEFT hip prosthesis. No acute fracture or dislocation identified. Electronically Signed   By: Lavonia Dana M.D.   On: 02/14/2019 20:54   DG Tibia/Fibula Left  Result Date: 02/14/2019 CLINICAL DATA:  Altered behavior, low back pain, pelvic pain, distal LEFT lower leg laceration, fell at home today, history asthma, heart murmur, hypertension, pneumonia, spinal surgery and scoliosis, former smoker, initial encounter EXAM: LEFT TIBIA AND FIBULA - 2 VIEW COMPARISON:  None FINDINGS: Osseous demineralization. Knee and ankle joint alignments normal. Chondrocalcinosis LEFT knee question CPPD. No acute fracture, dislocation, or bone destruction. Dorsal hardware at mid LEFT foot question TMT fusion. IMPRESSION: No acute osseous abnormalities. Electronically Signed   By: Lavonia Dana M.D.   On: 02/14/2019 20:58   CT Head  Result Date: 02/14/2019 CLINICAL DATA:  Fall, head trauma EXAM: CT HEAD WITHOUT CONTRAST TECHNIQUE: Contiguous axial images were obtained from the base of the skull through the vertex without intravenous contrast. COMPARISON:  September 28, 2018 FINDINGS: Brain: No evidence of acute territorial infarction, hemorrhage, hydrocephalus,extra-axial collection or mass lesion/mass effect. Prior left parietooccipital area encephalomalacia. There is dilatation the ventricles and sulci consistent with age-related atrophy. Low-attenuation changes in the deep white matter consistent with small vessel ischemia. Vascular: No hyperdense vessel or unexpected calcification. Skull: The skull is intact. No fracture or focal lesion identified. Sinuses/Orbits: The visualized paranasal sinuses and mastoid air cells are clear. The orbits and globes intact. Other: None IMPRESSION: No acute intracranial abnormality. Findings consistent with age related atrophy and chronic small vessel ischemia Prior left parietooccipital lobe infarct. Electronically Signed   By: Prudencio Pair M.D.   On: 02/14/2019 20:10   US  RENAL  Result Date: 02/14/2019 CLINICAL DATA:  78 year old female with acute renal insufficiency. EXAM: RENAL / URINARY TRACT ULTRASOUND COMPLETE COMPARISON:  Renal ultrasound dated 03/18/2016. FINDINGS: Right Kidney: Renal measurements: 8.6 x 3.9 x 3.6 cm = volume: 63 mL. Increased renal echogenicity. No hydronephrosis or shadowing stone. Left Kidney: Renal measurements: 8.0 x 3.6 x 3.1 cm = volume: 46 mL. Increased renal echogenicity. No hydronephrosis or shadowing stone. A 1 cm upper pole cyst again noted. Bladder: Appears normal for degree of bladder distention. Other: None. IMPRESSION: Increased renal echogenicity in keeping with chronic kidney disease. No hydronephrosis or shadowing stone. Electronically Signed   By: Anner Crete M.D.   On: 02/14/2019 23:58     Time spent: 35 minutes  Author: Berle Mull, MD [facility] 02/15/2019 4:22 PM  To reach On-call, see care teams to locate the attending and reach out to them via [email or web address]. If 7PM-7AM, please contact night-coverage If you still have difficulty reaching the attending provider, please page the [facility] (Director on Call) for [facility] on amion for assistance.

## 2019-02-15 NOTE — Plan of Care (Signed)

## 2019-02-15 NOTE — ED Notes (Signed)
ED TO INPATIENT HANDOFF REPORT  ED Nurse Name and Phone #: Laurelin Elson  S Name/Age/Gender Shirley [name] Points 78 y.o. female Room/Bed: 033C/033C  Code Status   Code Status: DNR  Home/SNF/Other Home Patient oriented to: self and place Is this baseline? Yes   Triage Complete: Triage complete  Chief Complaint AKI (acute kidney injury) [facility]) [N17.9]  Triage Note Patient arrives via [facility] due to falling at home. Per ems, patient fell and received a lacerating to left lower leg. Patient did not hit her head and no LOC. Patient reports some pain in left hip. Rates pain a 3/10. Does NOT take thinners.  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     Allergies Allergies  Allergen Reactions  . Buspirone Hcl     Headaches, twitching    Level of Care/Admitting Diagnosis ED Disposition    ED Disposition Condition [facility] Area: [place] [[id number]]  Level of Care: Med-Surg [16]  Covid Evaluation: Asymptomatic Screening Protocol (No Symptoms)  Diagnosis: AKI (acute kidney injury) [facility]) [[id number]]  Admitting Physician: Doreatha Massed  Attending Physician: Etta Quill [phone]  Estimated length of stay: past midnight tomorrow  Certification:: I certify this patient will need inpatient services for at least 2 midnights       B Medical/Surgery History Past Medical History:  Diagnosis Date  . Allergy    vasomotor rhinitis  . Anemia   . Asthma    h/o asthma and bronchitis  . BV (bacterial vaginosis)    history of frequent  . Cervical spondylosis    herniated disk protruding @ C6-7, impinging cord and left axillary root sleeve.  . Chronic pain   . Closed olecranon fracture, left, initial encounter   . Depression   . DVT of upper extremity (deep vein thrombosis) ([place])    h/o left(after wrist fracture/surgery); no residual thrombus or phlebitis by Dopplers July 2014  . Dyspnea    with exertion  . Edema   . Fibromyalgia    sees  Dr.Phillips-pain management  . GERD (gastroesophageal reflux disease)    h/o  . Headache    marigraine- hormal - none in years  . Heart murmur   . Hypertension    never has been treated that daughter is aware  . Neuropathy   . Neuropathy   . Osteopenia    mild at hip (T-1.3)  . PAT (paroxysmal atrial tachycardia) ([place])   . Pneumonia    years ago  . Recurrent falls 04/16/2016  . Varicose veins   . 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 Eilleen Kempf @ [facility]  . Vision problem    wears glasses  . Vitamin D deficiency    Past Surgical History:  Procedure Laterality Date  . ABDOMINAL HYSTERECTOMY  1999   BSO for endometriosis  . APPENDECTOMY    . BACK SURGERY  age 47   ruptured disk L3-4   . CATARACT EXTRACTION  2000   B/L  . COLONOSCOPY    . FINGER SURGERY     left finger for tender rupture from fall.  Marland Kitchen FOOT SURGERY     multiple(at least 4 on right, 5 on left)-left Dr.Kerner([place])11/08,left Dr.Nunley-excision 2nd and 3rd mt heads w/artho and hammertoe surgery 4th  and 5th 04/2006. left great toe IP fusion and revision of fusion of 2nd through 5th toes 12/2005. Right foot surgeries  Dr.Bednarz 04/2008 and 04/2009.  Marland Kitchen KNEE SURGERY Right    right knee replacement  . NECK SURGERY  2007   cervical  . ORIF ELBOW FRACTURE Left 03/22/2016   Procedure: OPEN REDUCTION INTERNAL FIXATION (ORIF) ELBOW/OLECRANON FRACTURE;  Surgeon: Altamese Bent Creek, MD;  Location: [place];  Service: Orthopedics;  Laterality: Left;  . ORIF ELBOW FRACTURE Left 04/15/2016   Procedure: OPEN REDUCTION INTERNAL FIXATION (ORIF) LEFT ELBOW/OLECRANON FRACTURE;  Surgeon: Altamese Lafayette, MD;  Location: [place];  Service: Orthopedics;  Laterality: Left;  . RADIOLOGY WITH ANESTHESIA Left 01/06/2016   Procedure: MRI LEFT HIP  WITH AND WITHOUT;  Surgeon: Medication Radiologist, MD;  Location: [place];  Service: Radiology;  Laterality: Left;  . REFRACTIVE SURGERY  2007   left eye  . REVERSE SHOULDER ARTHROPLASTY Left 12/31/2016   Procedure: REVERSE LEFT SHOULDER ARTHROPLASTY;  Surgeon: Netta Cedars, MD;  Location: [place];  Service: Orthopedics;  Laterality: Left;  . SHOULDER SURGERY     x8 (4 on rt side and 4 on left side)  . SPINAL FUSION  6/09   Dr.Cohen  . STERIOD INJECTION Left 04/15/2016   Procedure: LEFT HIP STEROID INJECTION;  Surgeon: Altamese Hickory, MD;  Location: [place];  Service: Orthopedics;  Laterality: Left;  . TONSILLECTOMY AND ADENOIDECTOMY    . TOTAL HIP ARTHROPLASTY Left 08/18/2016   Procedure: LEFT TOTAL HIP ARTHROPLASTY ANTERIOR APPROACH;  Surgeon: Gaynelle Arabian, MD;  Location: [facility];  Service: Orthopedics;  Laterality: Left;  . TRANSTHORACIC ECHOCARDIOGRAM  July 2012   Normal EF 60-65% , mild concentric LVH. Grade 2 diastolic dysfunction with elevated EDP. Massive left atrial dilation. Pulmonary pressures 30-40 mm Hg; aortic sclerosis but no stenosis. Moderate TR  . TRANSTHORACIC ECHOCARDIOGRAM  03/2016   EF 65-70%. Normal wall motion. GR 1 DD. Severe LA dilation. Mild to moderate TR with mild to moderately elevated PA pressures (44 mmHg) mild aortic calcification but no stenosis. No source of emboli   . WRIST SURGERY Left    2 surgeries left wrist after fracture(complicated by DVT)     A IV Location/Drains/Wounds Patient Lines/Drains/Airways Status   Active Line/Drains/Airways    Name:   Placement date:   Placement time:   Site:   Days:   Peripheral IV 02/14/19 Left Antecubital   02/14/19    1853    Antecubital   1          Intake/Output Last 24 hours  Intake/Output Summary (Last 24 hours) at 02/15/2019 0053 Last data filed at 02/14/2019 2041 Gross per 24 hour  Intake 500 ml  Output --  Net 500 ml    Labs/Imaging Results for orders placed or performed during the hospital  encounter of 02/14/19 (from the past 48 hour(s))  Lactic acid     Status: None   Collection Time: 02/14/19  6:50 PM  Result Value Ref Range   Lactic Acid, Venous 0.9 0.5 - 1.9 mmol/L    Comment: Performed at [facility], [place] [address] [place] [address]  CBC     Status: Abnormal   Collection Time: 02/14/19  6:55 PM  Result Value Ref Range   WBC 11.1 (H) 4.0 - 10.5 K/uL   RBC 3.59 (L) 3.87 - 5.11 MIL/uL   Hemoglobin 10.4 (L) 12.0 - 15.0 g/dL   HCT 34.8 (L) 36.0 - 46.0 %   MCV  96.9 80.0 - 100.0 fL   MCH 29.0 26.0 - 34.0 pg   MCHC 29.9 (L) 30.0 - 36.0 g/dL   RDW 14.4 11.5 - 15.5 %   Platelets 608 (H) 150 - 400 K/uL   nRBC 0.0 0.0 - 0.2 %    Comment: Performed at [facility] [address], [place], [place] [address]  CMP     Status: Abnormal   Collection Time: 02/14/19  6:55 PM  Result Value Ref Range   Sodium 136 135 - 145 mmol/L   Potassium 4.6 3.5 - 5.1 mmol/L   Chloride 100 98 - 111 mmol/L   CO2 23 22 - 32 mmol/L   Glucose, Bld 142 (H) 70 - 99 mg/dL   BUN 40 (H) 8 - 23 mg/dL   Creatinine, Ser 3.06 (H) 0.44 - 1.00 mg/dL   Calcium 8.7 (L) 8.9 - 10.3 mg/dL   Total Protein 6.1 (L) 6.5 - 8.1 g/dL   Albumin 3.5 3.5 - 5.0 g/dL   AST 17 15 - 41 U/L   ALT 14 0 - 44 U/L   Alkaline Phosphatase 77 38 - 126 U/L   Total Bilirubin 0.5 0.3 - 1.2 mg/dL   GFR calc non Af Amer 14 (L) >60 mL/min   GFR calc Af Amer 16 (L) >60 mL/min   Anion gap 13 5 - 15    Comment: Performed at [facility], [place] [address], [place], [place] [address]  Urinalysis, Routine w reflex microscopic     Status: Abnormal   Collection Time: 02/14/19  9:56 PM  Result Value Ref Range   Color, Urine YELLOW YELLOW   APPearance HAZY (A) CLEAR   Specific Gravity, Urine 1.011 1.005 - 1.030   pH 5.0 5.0 - 8.0   Glucose, UA NEGATIVE NEGATIVE mg/dL   Hgb urine dipstick NEGATIVE NEGATIVE   Bilirubin Urine NEGATIVE NEGATIVE   Ketones, ur NEGATIVE NEGATIVE mg/dL   Protein, ur 30 (A) NEGATIVE  mg/dL   Nitrite POSITIVE (A) NEGATIVE   Leukocytes,Ua MODERATE (A) NEGATIVE   RBC / HPF 0-5 0 - 5 RBC/hpf   WBC, UA 21-50 0 - 5 WBC/hpf   Bacteria, UA MANY (A) NONE SEEN   Squamous Epithelial / LPF 0-5 0 - 5   Mucus PRESENT    Hyaline Casts, UA PRESENT     Comment: Performed at [facility], [place] [address] [place], [place] [address]   DG Chest 1 View  Result Date: 02/14/2019 CLINICAL DATA:  Altered behavior, low back pain, pelvic pain, distal LEFT lower leg laceration, fell at home today, history asthma, heart murmur, hypertension, pneumonia, spinal surgery and scoliosis, former smoker, initial encounter EXAM: CHEST  1 VIEW COMPARISON:  09/28/2018 FINDINGS: Rotated to the RIGHT. Upper normal heart size. Tortuous aorta. Mediastinal contours and pulmonary vascularity grossly stable. RIGHT basilar atelectasis without infiltrate, pleural effusion or pneumothorax. Bones demineralized with note of prior LEFT shoulder replacement and thoracolumbar spinal fusion. Single screw noted at RIGHT infra glenoid region of scapula. Degenerative changes of RIGHT glenohumeral joint with chronic rotator cuff tear suspected. IMPRESSION: RIGHT basilar atelectasis. Electronically Signed   By: Lavonia Dana M.D.   On: 02/14/2019 20:52   DG Lumbar Spine Complete  Result Date: 02/14/2019 CLINICAL DATA:  Altered behavior, low back pain, pelvic pain, distal LEFT lower leg laceration, fell at home today, history asthma, heart murmur, hypertension, pneumonia, spinal surgery and scoliosis, former smoker, initial encounter EXAM: LUMBAR SPINE - COMPLETE 4+  VIEW COMPARISON:  None FINDINGS: Osseous demineralization. Prior thoracolumbar fusion extending from T10 into sacrum. Fracture of the RIGHT fusion rod inferior to the RIGHT S1 pedicle screw. Remaining hardware appears intact. Fixation hardware extends across RIGHT SI joint into RIGHT iliac bone. Dextroconvex scoliosis. No fracture, subluxation or bone destruction.  Atherosclerotic calcifications aorta. IMPRESSION: Prior thoracolumbosacral fusion with fracture of the RIGHT spinal rod inferior to the RIGHT S1 pedicle screw. Dextroconvex scoliosis. Osseous demineralization without definite acute bony abnormality. Electronically Signed   By: Lavonia Dana M.D.   On: 02/14/2019 20:51   DG Pelvis 1-2 Views  Result Date: 02/14/2019 CLINICAL DATA:  Altered behavior, low back pain, pelvic pain, distal LEFT lower leg laceration, fell at home today, history asthma, heart murmur, hypertension, pneumonia, spinal surgery and scoliosis, former smoker, initial encounter EXAM: PELVIS - 1-2 VIEW COMPARISON:  None FINDINGS: Prior lumbo spinal fusion with a RIGHT-side screw extending into RIGHT iliac bone. Fracture of the RIGHT spinal rod inferior to the RIGHT S1 pedicle screw. Post LEFT total hip arthroplasty. Pelvis intact. No acute fracture, dislocation, or bone destruction identified. IMPRESSION: Fracture of the RIGHT spinal rod inferior to the RIGHT S1 pedicle screw. LEFT hip prosthesis. No acute fracture or dislocation identified. Electronically Signed   By: Lavonia Dana M.D.   On: 02/14/2019 20:54   DG Tibia/Fibula Left  Result Date: 02/14/2019 CLINICAL DATA:  Altered behavior, low back pain, pelvic pain, distal LEFT lower leg laceration, fell at home today, history asthma, heart murmur, hypertension, pneumonia, spinal surgery and scoliosis, former smoker, initial encounter EXAM: LEFT TIBIA AND FIBULA - 2 VIEW COMPARISON:  None FINDINGS: Osseous demineralization. Knee and ankle joint alignments normal. Chondrocalcinosis LEFT knee question CPPD. No acute fracture, dislocation, or bone destruction. Dorsal hardware at mid LEFT foot question TMT fusion. IMPRESSION: No acute osseous abnormalities. Electronically Signed   By: Lavonia Dana M.D.   On: 02/14/2019 20:58   CT Head  Result Date: 02/14/2019 CLINICAL DATA:  Fall, head trauma EXAM: CT HEAD WITHOUT CONTRAST TECHNIQUE:  Contiguous axial images were obtained from the base of the skull through the vertex without intravenous contrast. COMPARISON:  September 28, 2018 FINDINGS: Brain: No evidence of acute territorial infarction, hemorrhage, hydrocephalus,extra-axial collection or mass lesion/mass effect. Prior left parietooccipital area encephalomalacia. There is dilatation the ventricles and sulci consistent with age-related atrophy. Low-attenuation changes in the deep white matter consistent with small vessel ischemia. Vascular: No hyperdense vessel or unexpected calcification. Skull: The skull is intact. No fracture or focal lesion identified. Sinuses/Orbits: The visualized paranasal sinuses and mastoid air cells are clear. The orbits and globes intact. Other: None IMPRESSION: No acute intracranial abnormality. Findings consistent with age related atrophy and chronic small vessel ischemia Prior left parietooccipital lobe infarct. Electronically Signed   By: Prudencio Pair M.D.   On: 02/14/2019 20:10   US RENAL  Result Date: 02/14/2019 CLINICAL DATA:  78 year old female with acute renal insufficiency. EXAM: RENAL / URINARY TRACT ULTRASOUND COMPLETE COMPARISON:  Renal ultrasound dated 03/18/2016. FINDINGS: Right Kidney: Renal measurements: 8.6 x 3.9 x 3.6 cm = volume: 63 mL. Increased renal echogenicity. No hydronephrosis or shadowing stone. Left Kidney: Renal measurements: 8.0 x 3.6 x 3.1 cm = volume: 46 mL. Increased renal echogenicity. No hydronephrosis or shadowing stone. A 1 cm upper pole cyst again noted. Bladder: Appears normal for degree of bladder distention. Other: None. IMPRESSION: Increased renal echogenicity in keeping with chronic kidney disease. No hydronephrosis or shadowing stone. Electronically Signed   By: Milas Hock  Radparvar M.D.   On: 02/14/2019 23:58    Pending Labs Unresulted Labs (From admission, onward)    Start     Ordered   02/15/19 0500  CBC  Tomorrow morning,   R     02/14/19 2241   02/15/19 6568  Basic  metabolic panel  Tomorrow morning,   R     02/14/19 2241   02/14/19 2236  Culture, Urine  Once,   STAT     02/14/19 2241   02/14/19 2118  SARS CORONAVIRUS 2 (TAT 6-24 HRS) Nasopharyngeal Nasopharyngeal Swab  (Tier 3 (TAT 6-24 hrs))  Once,   STAT    Question Answer Comment  Is this test for diagnosis or screening Screening   Symptomatic for COVID-19 as defined by CDC No   Hospitalized for COVID-19 No   Admitted to ICU for COVID-19 No   Previously tested for COVID-19 Yes   Resident in a congregate (group) care setting Unknown   Employed in healthcare setting Unknown   Pregnant No      02/14/19 2119          Vitals/Pain Today's Vitals   02/14/19 1939 02/14/19 1945 02/14/19 2000 02/14/19 2153  BP: 107/66 113/67 128/70 117/71  Pulse: 78   75  Resp: 16 20 19 18   Temp:      TempSrc:      SpO2: 99%   99%  Weight:      Height:      PainSc:        Isolation Precautions No active isolations  Medications Medications  0.9 %  sodium chloride infusion ( Intravenous New Bag/Given 02/14/19 2239)  acetaminophen (TYLENOL) tablet 650 mg (has no administration in time range)    Or  acetaminophen (TYLENOL) suppository 650 mg (has no administration in time range)  ondansetron (ZOFRAN) tablet 4 mg (has no administration in time range)    Or  ondansetron (ZOFRAN) injection 4 mg (has no administration in time range)  enoxaparin (LOVENOX) injection 40 mg (has no administration in time range)  cefTRIAXone (ROCEPHIN) 1 g in sodium chloride 0.9 % 100 mL IVPB (1 g Intravenous New Bag/Given 02/15/19 0046)  ALPRAZolam (XANAX) tablet 0.5-1 mg (has no administration in time range)  gabapentin (NEURONTIN) capsule 300 mg (has no administration in time range)  oxyCODONE (Oxy IR/ROXICODONE) immediate release tablet 10-15 mg (has no administration in time range)  gabapentin (NEURONTIN) capsule 900 mg (has no administration in time range)  sodium chloride 0.9 % bolus 500 mL (0 mLs Intravenous Stopped  02/14/19 2041)  lidocaine-EPINEPHrine (XYLOCAINE W/EPI) 1 %-1:100000 (with pres) injection 10 mL (10 mLs Other Given by Other 02/14/19 1920)    Mobility walks with person assist High fall risk   Focused Assessments    R Recommendations: See Admitting Provider Note  Report given to:   Additional Notes:

## 2019-02-16 ENCOUNTER — Encounter (HOSPITAL_COMMUNITY): Payer: Self-pay | Admitting: Internal Medicine

## 2019-02-16 LAB — MAGNESIUM: Magnesium: 1.9 mg/dL (ref 1.7–2.4)

## 2019-02-16 LAB — BASIC METABOLIC PANEL
Anion gap: 11 (ref 5–15)
BUN: 26 mg/dL — ABNORMAL HIGH (ref 8–23)
CO2: 22 mmol/L (ref 22–32)
Calcium: 8.7 mg/dL — ABNORMAL LOW (ref 8.9–10.3)
Chloride: 108 mmol/L (ref 98–111)
Creatinine, Ser: 1.58 mg/dL — ABNORMAL HIGH (ref 0.44–1.00)
GFR calc Af Amer: 35 mL/min — ABNORMAL LOW (ref >60)
GFR calc non Af Amer: 31 mL/min — ABNORMAL LOW (ref >60)
Glucose, Bld: 84 mg/dL (ref 70–99)
Potassium: 3.8 mmol/L (ref 3.5–5.1)
Sodium: 141 mmol/L (ref 135–145)

## 2019-02-16 LAB — CBC
HCT: 30.9 % — ABNORMAL LOW (ref 36.0–46.0)
Hemoglobin: 9.6 g/dL — ABNORMAL LOW (ref 12.0–15.0)
MCH: 29 pg (ref 26.0–34.0)
MCHC: 31.1 g/dL (ref 30.0–36.0)
MCV: 93.4 fL (ref 80.0–100.0)
Platelets: 579 10*3/uL — ABNORMAL HIGH (ref 150–400)
RBC: 3.31 MIL/uL — ABNORMAL LOW (ref 3.87–5.11)
RDW: 14.3 % (ref 11.5–15.5)
WBC: 6.9 10*3/uL (ref 4.0–10.5)
nRBC: 0 % (ref 0.0–0.2)

## 2019-02-16 MED ORDER — GABAPENTIN 300 MG PO CAPS: 300.0000 mg | ORAL_CAPSULE | ORAL | Status: DC

## 2019-02-16 MED ORDER — GABAPENTIN 300 MG PO CAPS
300.0000 mg | ORAL_CAPSULE | Freq: Two times a day (BID) | ORAL | Status: DC
  Administered 2019-02-16 – 2019-02-17 (×2): 300 mg via ORAL
  Filled 2019-02-16 (×2): qty 1

## 2019-02-16 MED ORDER — AMITRIPTYLINE HCL 25 MG PO TABS
12.5000 mg | ORAL_TABLET | Freq: Every day | ORAL | Status: DC
  Administered 2019-02-16: 25 mg via ORAL
  Filled 2019-02-16: qty 1

## 2019-02-16 MED ORDER — AMLODIPINE BESYLATE 5 MG PO TABS: 5.0000 mg | ORAL_TABLET | Freq: Every day | ORAL | Status: DC | PRN

## 2019-02-16 MED ORDER — FLUOXETINE HCL 20 MG PO CAPS
40.0000 mg | ORAL_CAPSULE | Freq: Every day | ORAL | Status: DC
  Administered 2019-02-16 – 2019-02-17 (×2): 40 mg via ORAL
  Filled 2019-02-16 (×2): qty 2

## 2019-02-16 MED ORDER — GABAPENTIN 300 MG PO CAPS
900.0000 mg | ORAL_CAPSULE | Freq: Every day | ORAL | Status: DC
  Administered 2019-02-16: 900 mg via ORAL
  Filled 2019-02-16: qty 3

## 2019-02-16 MED ORDER — OXYCODONE HCL 5 MG PO TABS
15.0000 mg | ORAL_TABLET | ORAL | Status: DC | PRN
  Administered 2019-02-16: 15 mg via ORAL
  Administered 2019-02-16: 13:00:00 10 mg via ORAL
  Administered 2019-02-17 (×3): 15 mg via ORAL
  Filled 2019-02-16 (×5): qty 3

## 2019-02-16 MED ORDER — ROSUVASTATIN CALCIUM 5 MG PO TABS
5.0000 mg | ORAL_TABLET | Freq: Every day | ORAL | Status: DC
  Administered 2019-02-16 – 2019-02-17 (×2): 5 mg via ORAL
  Filled 2019-02-16 (×2): qty 1

## 2019-02-16 MED ORDER — THIAMINE HCL 100 MG PO TABS
100.0000 mg | ORAL_TABLET | Freq: Every day | ORAL | Status: DC
  Administered 2019-02-16 – 2019-02-17 (×2): 100 mg via ORAL
  Filled 2019-02-16 (×2): qty 1

## 2019-02-16 NOTE — Plan of Care (Signed)
  Problem: Skin Integrity: Goal: Risk for impaired skin integrity will decrease Outcome: Progressing   Problem: Safety: Goal: Ability to remain free from injury will improve Outcome: Progressing   Problem: Pain Managment: Goal: General experience of comfort will improve Outcome: Progressing   Problem: Elimination: Goal: Will not experience complications related to bowel motility Outcome: Progressing   Problem: Coping: Goal: Level of anxiety will decrease Outcome: Progressing

## 2019-02-16 NOTE — Progress Notes (Signed)
[facility] Progress Note  Patient: Shirley [name] TIR:[id number]   PCP: Ardith Dark, PA-C DOB: 1940-03-14   DOA: 02/14/2019   DOS: 02/16/2019   Date of Service: the patient was seen and examined on 02/16/2019  Chief Complaint  Patient presents with  . Fall   Brief hospital course: Pt. with PMH of chronic low back pain, arthritis, recurrent fall, recurrent UTI; presented with complain of fall, was found to have acute kidney injury from poor p.o. intake.  Currently further plan is continue aggressive IV hydration as well as monitor renal function and monitor acute encephalopathy.  Subjective: Mentation significantly better.  No acute complaints.  No acute event overnight.  No nausea no vomiting.  Oral intake improving.  Assessment and Plan: Scheduled Meds: . amitriptyline  12.5-50 mg Oral QHS  . enoxaparin (LOVENOX) injection  30 mg Subcutaneous Q24H  . FLUoxetine  40 mg Oral Daily  . gabapentin  300 mg Oral BID WC  . gabapentin  900 mg Oral QHS  . rosuvastatin  5 mg Oral Daily  . thiamine  100 mg Oral Daily   Continuous Infusions: . cefTRIAXone (ROCEPHIN)  IV Stopped (02/15/19 2222)   PRN Meds: acetaminophen **OR** acetaminophen, ALPRAZolam, amLODipine, ondansetron **OR** ondansetron (ZOFRAN) IV, oxyCODONE  1.  Acute kidney injury on chronic kidney disease stage IIIa Suspecting prerenal from poor p.o. intake. Holding diuretics. Improving with IV hydration.  Discontinue IV fluids for now. Renal ultrasound negative for any obstructive uropathy.   avoid nephrotoxic medications. Renal dosing of all the medication per pharmacy.  2.  Klebsiella UTI. UA shows evidence of pyuria as well as nitrite positive. Urine culture growing Klebsiella, sensitivity pending. No obstructive uropathy per Continue with IV ceftriaxone.  3.  Acute metabolic encephalopathy. Chronic anxiety  chronic pain syndrome. Delirium in the setting of UTI as well as AKI as well as polypharmacy.  QTC is normal therefore will attempt Seroquel. Patient is on Xanax.  We will reduce the dose. Patient is on oxycodone we will resume home dose of that as well. Patient is on gabapentin we will resume home dose of that as well.  4.  Essential hypertension blood HFpEF. Holding diuretics. Blood pressure actually soft holding blood pressure medications.  5.  Leg laceration. Sutured in the ED by EDP. Appreciate wound care assistance. Xeroform gauze.  Diet: Cardiac diet  DVT Prophylaxis: Subcutaneous Lovenox   Advance goals of care discussion: DNR  Family Communication: no family was present at bedside, at the time of interview.   Disposition:  Discharge to home on 02/17/2019 PT OT recommends home with home health  Consultants: none Procedures: none  Antibiotics: Anti-infectives (From admission, onward)   Start     Dose/Rate Route Frequency Ordered Stop   02/14/19 2245  cefTRIAXone (ROCEPHIN) 1 g in sodium chloride 0.9 % 100 mL IVPB     1 g 200 mL/hr over 30 Minutes Intravenous Every 24 hours 02/14/19 2241         Objective: Physical Exam: Vitals:   02/15/19 1500 02/15/19 1934 02/16/19 0244 02/16/19 1002  BP: 134/62 140/78 130/73 138/69  Pulse: 91 90 80 91  Resp: 15 16  17   Temp: 98.3 F (36.8 C) 97.9 F (36.6 C) (!) 97.4 F (36.3 C) 98.2 F (36.8 C)  TempSrc: Oral Oral Oral Oral  SpO2: 94% 96% 98% 99%  Weight:      Height:        Intake/Output Summary (Last 24 hours) at 02/16/2019 1512 Last data filed  at 02/16/2019 0600 Gross per 24 hour  Intake 929 ml  Output 850 ml  Net 79 ml   Filed Weights   02/14/19 1738  Weight: 72.6 kg   General: alert and not oriented to time, place, and person. Appear in moderate distress, affect emotionally labile Eyes: PERRL, Conjunctiva normal ENT: Oral Mucosa Clear, dry  Neck: no JVD, no Abnormal Mass Or lumps Cardiovascular: S1 and S2 Present, no Murmur,  Respiratory: good respiratory effort, Bilateral Air entry equal  and Decreased, no signs of accessory muscle use, Clear to Auscultation, no Crackles, no wheezes Abdomen: Bowel Sound present, Soft and no tenderness, no hernia Skin: no rashes  Extremities: no Pedal edema, no calf tenderness Neurologic: without any new focal findings negative asterixis. Gait not checked due to patient safety concerns  Data Reviewed: I have personally reviewed and interpreted daily labs, tele strips, imagings as discussed above. I reviewed all nursing notes, pharmacy notes, vitals, pertinent old records I have discussed plan of care as described above with RN and patient/family.  CBC: Recent Labs  Lab 02/14/19 1855 02/15/19 0434 02/16/19 0340  WBC 11.1* 8.3 6.9  HGB 10.4* 9.4* 9.6*  HCT 34.8* 30.3* 30.9*  MCV 96.9 93.2 93.4  PLT 608* 569* 601*   Basic Metabolic Panel: Recent Labs  Lab 02/14/19 1855 02/15/19 0434 02/16/19 0340  NA 136 137 141  K 4.6 4.1 3.8  CL 100 102 108  CO2 23 22 22   GLUCOSE 142* 124* 84  BUN 40* 39* 26*  CREATININE 3.06* 2.81* 1.58*  CALCIUM 8.7* 8.7* 8.7*  MG  --   --  1.9    Liver Function Tests: Recent Labs  Lab 02/14/19 1855  AST 17  ALT 14  ALKPHOS 77  BILITOT 0.5  PROT 6.1*  ALBUMIN 3.5   No results for input(s): LIPASE, AMYLASE in the last 168 hours. Recent Labs  Lab 02/15/19 1637  AMMONIA 21   Coagulation Profile: No results for input(s): INR, PROTIME in the last 168 hours. Cardiac Enzymes: No results for input(s): CKTOTAL, CKMB, CKMBINDEX, TROPONINI in the last 168 hours. BNP (last 3 results) No results for input(s): PROBNP in the last 8760 hours. CBG: No results for input(s): GLUCAP in the last 168 hours. Studies: No results found.   Time spent: 35 minutes  Author: Berle Mull, MD [facility] 02/16/2019 3:12 PM  To reach On-call, see care teams to locate the attending and reach out to them via [email or web address]. If 7PM-7AM, please contact night-coverage If you still have difficulty reaching  the attending provider, please page the [facility] (Director on Call) for [facility] on amion for assistance.

## 2019-02-16 NOTE — Evaluation (Signed)
Physical Therapy Evaluation Patient Details Name: Shirley [name] MRN: [id number] DOB: 05-15-1940 Today's Date: 02/16/2019   History of Present Illness  78 y.o. female with medical history significant of chronic low back pain and arthritis, recurrent falls, recurrent UTIs. Per daughter pt has had 3 falls in 12 hours prior to admission and has had increased confusion. Pt admitted 02/14/19 with L leg laceration which has been sutured and treatment UTI  Clinical Impression  PTA pt lives with husband with dementia, in single story home with 1 step to enter. Pt has caregivers until 9 pm at night which help with bathing, dressing and iADLs. Pt uses Rollator to ambulate at home. Pt is currently limited in safe mobility by decreased cognition in presence of generalized weakness and decreased balance. PT recommends 24 hour care at discharge with supervision of mobility and HHPT to improve strength and balance. PT will continue to follow acutely.     Follow Up Recommendations Home health PT;Supervision/Assistance - 24 hour    Equipment Recommendations  None recommended by PT       Precautions / Restrictions Precautions Precautions: Fall Precaution Comments: 3 falls immediately prior to admission Restrictions Weight Bearing Restrictions: No      Mobility  Bed Mobility Overal bed mobility: Needs Assistance Bed Mobility: Supine to Sit     Supine to sit: Min guard     General bed mobility comments: min guard for safety, 1x backward LoB able to self correct  Transfers Overall transfer level: Needs assistance Equipment used: Rolling walker (2 wheeled) Transfers: Sit to/from Stand Sit to Stand: Min assist         General transfer comment: min A for steadying in upright  Ambulation/Gait Ambulation/Gait assistance: Min assist Gait Distance (Feet): 18 Feet Assistive device: Rolling walker (2 wheeled) Gait Pattern/deviations: Step-through pattern;Decreased step length - right;Decreased  step length - left;Narrow base of support Gait velocity: slowed Gait velocity interpretation: <1.31 ft/sec, indicative of household ambulator General Gait Details: min A for steadying with slow, mildly unsteady gait, no overt LoB        Balance Overall balance assessment: Needs assistance Sitting-balance support: Feet supported;No upper extremity supported Sitting balance-Leahy Scale: Poor Sitting balance - Comments: requires feet on floor to maintain balance Postural control: Posterior lean Standing balance support: Bilateral upper extremity supported Standing balance-Leahy Scale: Poor                               Pertinent Vitals/Pain Pain Assessment: Faces Faces Pain Scale: Hurts a little bit Pain Location: L LE in dependent position Pain Descriptors / Indicators: Aching;Sore Pain Intervention(s): Limited activity within patient's tolerance;Monitored during session;Repositioned    Home Living Family/patient expects to be discharged to:: Private residence Living Arrangements: Spouse/significant other Available Help at Discharge: Personal care attendant;Available PRN/intermittently;Family Type of Home: House Home Access: Stairs to enter Entrance Stairs-Rails: None Entrance Stairs-Number of Steps: 1+1 Home Layout: One level Home Equipment: Environmental consultant - 4 wheels;Shower seat;Bedside commode;Grab bars - tub/shower Additional Comments: Pt has daytime caregivers until 9 p    Prior Function Level of Independence: Needs assistance   Gait / Transfers Assistance Needed: uses rollator   ADL's / Homemaking Assistance Needed: caregivers assist with bathing, dressing and iADLS        Hand Dominance   Dominant Hand: Right    Extremity/Trunk Assessment   Upper Extremity Assessment Upper Extremity Assessment: LUE deficits/detail LUE Deficits / Details: L shoulder ROM deficit  from prior sx    Lower Extremity Assessment Lower Extremity Assessment: Generalized  weakness;LLE deficits/detail;RLE deficits/detail RLE Deficits / Details: decreased knee ROM, TKA RLE Coordination: decreased fine motor;decreased gross motor LLE Deficits / Details: decreased knee ROM, TKA LLE Coordination: decreased fine motor    Cervical / Trunk Assessment Cervical / Trunk Assessment: Kyphotic  Communication   Communication: No difficulties  Cognition Arousal/Alertness: Awake/alert Behavior During Therapy: WFL for tasks assessed/performed Overall Cognitive Status: Impaired/Different from baseline Area of Impairment: Orientation;Memory;Safety/judgement;Problem solving                 Orientation Level: Disoriented to;Place;Time;Situation   Memory: Decreased short-term memory   Safety/Judgement: Decreased awareness of safety;Decreased awareness of deficits   Problem Solving: Slow processing;Requires verbal cues;Requires tactile cues General Comments: pt oriented to herself in terms of current hospitalization and events leading up to it, difficulty with word finding      General Comments General comments (skin integrity, edema, etc.): dressing on on L LE over sutures clean dry and in place        Assessment/Plan    PT Assessment Patient needs continued PT services  PT Problem List Decreased strength;Decreased activity tolerance;Decreased balance;Decreased mobility;Decreased cognition;Decreased safety awareness       PT Treatment Interventions DME instruction;Gait training;Stair training;Functional mobility training;Therapeutic activities;Therapeutic exercise;Balance training;Cognitive remediation;Patient/family education    PT Goals (Current goals can be found in the Care Plan section)  Acute Rehab PT Goals Patient Stated Goal: not fall PT Goal Formulation: With patient Time For Goal Achievement: 03/02/19 Potential to Achieve Goals: Good    Frequency Min 3X/week   Barriers to discharge Decreased caregiver support caregivers only until 9 pm         AM-PAC PT "6 Clicks" Mobility  Outcome Measure Help needed turning from your back to your side while in a flat bed without using bedrails?: None Help needed moving from lying on your back to sitting on the side of a flat bed without using bedrails?: A Little Help needed moving to and from a bed to a chair (including a wheelchair)?: A Little Help needed standing up from a chair using your arms (e.g., wheelchair or bedside chair)?: A Little Help needed to walk in hospital room?: A Little Help needed climbing 3-5 steps with a railing? : A Lot 6 Click Score: 18    End of Session Equipment Utilized During Treatment: Gait belt Activity Tolerance: Patient tolerated treatment well Patient left: in chair;with call bell/phone within reach;with chair alarm set Nurse Communication: Mobility status PT Visit Diagnosis: Unsteadiness on feet (R26.81);Other abnormalities of gait and mobility (R26.89);Muscle weakness (generalized) (M62.81);Repeated falls (R29.6);Difficulty in walking, not elsewhere classified (R26.2)    Time: 1610-9604 PT Time Calculation (min) (ACUTE ONLY): 28 min   Charges:   PT Evaluation $PT Eval Moderate Complexity: 1 Mod PT Treatments $Gait Training: 8-22 mins        Othello Sgroi B. Migdalia Dk PT, DPT Acute Rehabilitation Services Pager [phone] Office [phone]   [place] 02/16/2019, 9:59 AM

## 2019-02-17 LAB — BASIC METABOLIC PANEL
Anion gap: 11 (ref 5–15)
BUN: 27 mg/dL — ABNORMAL HIGH (ref 8–23)
CO2: 19 mmol/L — ABNORMAL LOW (ref 22–32)
Calcium: 8.3 mg/dL — ABNORMAL LOW (ref 8.9–10.3)
Chloride: 108 mmol/L (ref 98–111)
Creatinine, Ser: 1.48 mg/dL — ABNORMAL HIGH (ref 0.44–1.00)
GFR calc Af Amer: 38 mL/min — ABNORMAL LOW (ref >60)
GFR calc non Af Amer: 33 mL/min — ABNORMAL LOW (ref >60)
Glucose, Bld: 139 mg/dL — ABNORMAL HIGH (ref 70–99)
Potassium: 4.5 mmol/L (ref 3.5–5.1)
Sodium: 138 mmol/L (ref 135–145)

## 2019-02-17 LAB — URINE CULTURE: Culture: >=100000 — AB

## 2019-02-17 LAB — CBC
HCT: 33 % — ABNORMAL LOW (ref 36.0–46.0)
Hemoglobin: 10.2 g/dL — ABNORMAL LOW (ref 12.0–15.0)
MCH: 29.4 pg (ref 26.0–34.0)
MCHC: 30.9 g/dL (ref 30.0–36.0)
MCV: 95.1 fL (ref 80.0–100.0)
Platelets: 644 10*3/uL — ABNORMAL HIGH (ref 150–400)
RBC: 3.47 MIL/uL — ABNORMAL LOW (ref 3.87–5.11)
RDW: 14.6 % (ref 11.5–15.5)
WBC: 7.9 10*3/uL (ref 4.0–10.5)
nRBC: 0 % (ref 0.0–0.2)

## 2019-02-17 MED ORDER — TORSEMIDE 10 MG PO TABS: 10.0000 mg | ORAL_TABLET | ORAL | Status: AC | PRN

## 2019-02-17 MED ORDER — CEFPODOXIME PROXETIL 200 MG PO TABS: 200.0000 mg | ORAL_TABLET | Freq: Two times a day (BID) | ORAL | 0 refills | Status: DC

## 2019-02-17 MED ORDER — CEFDINIR 300 MG PO CAPS: 300.0000 mg | ORAL_CAPSULE | Freq: Every day | ORAL | 0 refills | Status: DC

## 2019-02-17 NOTE — TOC Transition Note (Signed)
Transition of Care [facility]) - CM/SW Discharge Note   Patient Details  Name: Shirley [name] MRN: [id number] Date of Birth: 1940-12-05  [facility] CM/SW Contact:  Claudie Leach, RN 02/17/2019, 1:31 PM   Clinical Narrative:    Pt to return home with HHPT.  Patient without preference of [place] agency but recently used [facility].  [facility], [facility], and [facility] declined referral.  Referral accepted by [facility].    No DME needed.   Final next level of care: [place] Barriers to Discharge: No Barriers Identified   Patient Goals and CMS Choice Patient states their goals for this hospitalization and ongoing recovery are:: to get home CMS Medicare.gov Compare Post Acute Care list provided to:: Patient Choice offered to / list presented to : Patient   Discharge Plan and Services      HH Arranged: PT [facility] Agency: [place] Date [place]: 02/17/19 Time [place]: [id number] Representative spoke with at [place]: Tommi Rumps

## 2019-02-17 NOTE — Plan of Care (Signed)
  Problem: Education: Goal: Knowledge of General Education information will improve Description: Including pain rating scale, medication(s)/side effects and non-pharmacologic comfort measures Outcome: Progressing   Problem: Safety: Goal: Ability to remain free from injury will improve Outcome: Progressing   

## 2019-02-17 NOTE — Discharge Summary (Signed)
[facility] Discharge Summary   Patient: Shirley [name] YNW:[id number]   PCP: Ardith Dark, PA-C DOB: October 23, 1940   Date of admission: 02/14/2019   Date of discharge: 02/17/2019     Discharge Diagnoses:  Principal Problem:   AKI (acute kidney injury) ([place]) Active Problems:   Acute lower UTI   Acute metabolic encephalopathy   Fall at home, initial encounter   CKD (chronic kidney disease), stage III   Chronic diastolic CHF (congestive heart failure) ([place])   Fracture of hardware in spine ([place])   Leg laceration   Admitted From: Home Disposition:  Home with home health  Recommendations for Outpatient Follow-up:  1. PCP: Follow-up with PCP in 1 week 2. Follow up LABS/TEST: None  Follow-up Information    Hedgecock, Jillene Bucks. Schedule an appointment as soon as possible for a visit in 1 week(s).   Specialty: Physician Assistant Contact information: [address] [place] [address] [phone]        [facility] Follow up.   Specialty: Home Health Services Why: Agency will contact you to arrange therapy visits.  Contact information: [address] [place] [address] [phone]          Diet recommendation: Regular diet  Activity: The patient is advised to gradually reintroduce usual activities,as tolerated  Discharge Condition: good  Code Status: DNR   History of present illness: As per the H and P dictated on admission, "Shirley [name] is a 78 y.o. female with medical history significant of chronic low back pain and arthritis, recurrent falls, recurrent UTIs.  History obtained mostly from patient's daughter. Patient has had 3 falls over the past 12 hours. Fell in the middle of the night, fell again this morning, no significant injuries during these falls. Patient has been more confused than normal, talking about her mother recently dying but this occurred several years ago. Daughter explains that when she falls  often it is usually a UTI. No recent fever."  Hospital Course:  Summary of her active problems in the hospital is as following. 1.  Acute kidney injury on chronic kidney disease stage IIIa Suspecting prerenal from poor p.o. intake. Holding diuretics. Improved with IV hydration Renal ultrasound negative for any obstructive uropathy.   avoid nephrotoxic medications.  2.  Klebsiella UTI. UA shows evidence of pyuria as well as nitrite positive. Urine culture growing Klebsiella, sensitivity available No obstructive uropathy  Treated with with IV ceftriaxone.  Transition to oral antibiotics  3.  Acute metabolic encephalopathy. Chronic anxiety  chronic pain syndrome. Delirium in the setting of UTI as well as AKI as well as polypharmacy. Patient was given Seroquel in the hospital.  Her home medication dose were adjusted. Now that her renal function is improved as well as her mentation is improved.  We resumed her home medication and her mentation continues to remain stable. On discharge resume home medications.  4.  Essential hypertension blood HFpEF. Holding diuretics. Resume home medications  5.  Leg laceration. Sutured in the ED by EDP. Appreciate wound care assistance. Xeroform gauze.  Patient was seen by physical therapy, who recommended Home health, which was arranged. On the day of the discharge the patient's vitals were stable, and no other acute medical condition were reported by patient. the patient was felt safe to be discharge at Home with Home health.  Consultants: none Procedures: none  DISCHARGE MEDICATION: Allergies as of 02/17/2019      Reactions   Buspirone Hcl Other (See  Comments)   Headaches, twitching      Medication List    TAKE these medications   acetaminophen 325 MG tablet Commonly known as: TYLENOL Take 2 tablets (650 mg total) by mouth every 6 (six) hours as needed for mild pain, fever or headache (or Fever >/= 101).   amitriptyline 25 MG  tablet Commonly known as: ELAVIL Take 12.5-50 mg by mouth at bedtime.   amLODipine 5 MG tablet Commonly known as: NORVASC Take 5 mg by mouth daily as needed (high blood pressure).   cefdinir 300 MG capsule Commonly known as: OMNICEF Take 1 capsule (300 mg total) by mouth daily for 3 days.   cyclobenzaprine 10 MG tablet Commonly known as: FLEXERIL Take 10 mg by mouth 2 (two) times daily.   FLUoxetine 40 MG capsule Commonly known as: PROZAC Take 40 mg by mouth daily.   gabapentin 300 MG capsule Commonly known as: NEURONTIN Take 300-900 mg by mouth See admin instructions. Take 300 mg by mouth in the morning and take 900 mg by mouth at bedtime.   hydrOXYzine 25 MG capsule Commonly known as: VISTARIL Take 25 mg by mouth 2 (two) times daily.   lansoprazole 30 MG capsule Commonly known as: PREVACID Take 30 mg by mouth daily.   metoprolol tartrate 25 MG tablet Commonly known as: LOPRESSOR Take 1 tablet (25 mg total) by mouth 2 (two) times daily.   oxyCODONE 15 MG immediate release tablet Commonly known as: ROXICODONE Take 15 mg by mouth 2 (two) times daily.   rosuvastatin 5 MG tablet Commonly known as: CRESTOR Take 5 mg by mouth daily.   torsemide 10 MG tablet Commonly known as: DEMADEX Take 1 tablet (10 mg total) by mouth every other day as needed (for fluid). What changed:   how much to take  when to take this  reasons to take this      Allergies  Allergen Reactions  . Buspirone Hcl Other (See Comments)    Headaches, twitching   Discharge Instructions    Diet - low sodium heart healthy   Complete by: As directed    Increase activity slowly   Complete by: As directed      Discharge Exam: Filed Weights   02/14/19 1738  Weight: 72.6 kg   Vitals:   02/17/19 0350 02/17/19 0835  BP: (!) 153/85 120/76  Pulse: 92 96  Resp: 16   Temp: (!) 97.5 F (36.4 C) 97.7 F (36.5 C)  SpO2: 98% 97%   General: Appear in no distress, no Rash; Oral Mucosa Clear,  moist. no Abnormal Mass Or lumps Cardiovascular: S1 and S2 Present, no Murmur, Respiratory: normal respiratory effort, Bilateral Air entry present and Clear to Auscultation, no Crackles, no wheezes Abdomen: Bowel Sound present, Soft and no tenderness, no hernia Extremities: no Pedal edema, no calf tenderness Neurology: alert and oriented to time, place, and person affect appropriate.  The results of significant diagnostics from this hospitalization (including imaging, microbiology, ancillary and laboratory) are listed below for reference.    Significant Diagnostic Studies: DG Chest 1 View  Result Date: 02/14/2019 CLINICAL DATA:  Altered behavior, low back pain, pelvic pain, distal LEFT lower leg laceration, fell at home today, history asthma, heart murmur, hypertension, pneumonia, spinal surgery and scoliosis, former smoker, initial encounter EXAM: CHEST  1 VIEW COMPARISON:  09/28/2018 FINDINGS: Rotated to the RIGHT. Upper normal heart size. Tortuous aorta. Mediastinal contours and pulmonary vascularity grossly stable. RIGHT basilar atelectasis without infiltrate, pleural effusion or pneumothorax. Bones demineralized  with note of prior LEFT shoulder replacement and thoracolumbar spinal fusion. Single screw noted at RIGHT infra glenoid region of scapula. Degenerative changes of RIGHT glenohumeral joint with chronic rotator cuff tear suspected. IMPRESSION: RIGHT basilar atelectasis. Electronically Signed   By: Lavonia Dana M.D.   On: 02/14/2019 20:52   DG Lumbar Spine Complete  Result Date: 02/14/2019 CLINICAL DATA:  Altered behavior, low back pain, pelvic pain, distal LEFT lower leg laceration, fell at home today, history asthma, heart murmur, hypertension, pneumonia, spinal surgery and scoliosis, former smoker, initial encounter EXAM: LUMBAR SPINE - COMPLETE 4+ VIEW COMPARISON:  None FINDINGS: Osseous demineralization. Prior thoracolumbar fusion extending from T10 into sacrum. Fracture of the RIGHT  fusion rod inferior to the RIGHT S1 pedicle screw. Remaining hardware appears intact. Fixation hardware extends across RIGHT SI joint into RIGHT iliac bone. Dextroconvex scoliosis. No fracture, subluxation or bone destruction. Atherosclerotic calcifications aorta. IMPRESSION: Prior thoracolumbosacral fusion with fracture of the RIGHT spinal rod inferior to the RIGHT S1 pedicle screw. Dextroconvex scoliosis. Osseous demineralization without definite acute bony abnormality. Electronically Signed   By: Lavonia Dana M.D.   On: 02/14/2019 20:51   DG Pelvis 1-2 Views  Result Date: 02/14/2019 CLINICAL DATA:  Altered behavior, low back pain, pelvic pain, distal LEFT lower leg laceration, fell at home today, history asthma, heart murmur, hypertension, pneumonia, spinal surgery and scoliosis, former smoker, initial encounter EXAM: PELVIS - 1-2 VIEW COMPARISON:  None FINDINGS: Prior lumbo spinal fusion with a RIGHT-side screw extending into RIGHT iliac bone. Fracture of the RIGHT spinal rod inferior to the RIGHT S1 pedicle screw. Post LEFT total hip arthroplasty. Pelvis intact. No acute fracture, dislocation, or bone destruction identified. IMPRESSION: Fracture of the RIGHT spinal rod inferior to the RIGHT S1 pedicle screw. LEFT hip prosthesis. No acute fracture or dislocation identified. Electronically Signed   By: Lavonia Dana M.D.   On: 02/14/2019 20:54   DG Tibia/Fibula Left  Result Date: 02/14/2019 CLINICAL DATA:  Altered behavior, low back pain, pelvic pain, distal LEFT lower leg laceration, fell at home today, history asthma, heart murmur, hypertension, pneumonia, spinal surgery and scoliosis, former smoker, initial encounter EXAM: LEFT TIBIA AND FIBULA - 2 VIEW COMPARISON:  None FINDINGS: Osseous demineralization. Knee and ankle joint alignments normal. Chondrocalcinosis LEFT knee question CPPD. No acute fracture, dislocation, or bone destruction. Dorsal hardware at mid LEFT foot question TMT fusion.  IMPRESSION: No acute osseous abnormalities. Electronically Signed   By: Lavonia Dana M.D.   On: 02/14/2019 20:58   CT Head  Result Date: 02/14/2019 CLINICAL DATA:  Fall, head trauma EXAM: CT HEAD WITHOUT CONTRAST TECHNIQUE: Contiguous axial images were obtained from the base of the skull through the vertex without intravenous contrast. COMPARISON:  September 28, 2018 FINDINGS: Brain: No evidence of acute territorial infarction, hemorrhage, hydrocephalus,extra-axial collection or mass lesion/mass effect. Prior left parietooccipital area encephalomalacia. There is dilatation the ventricles and sulci consistent with age-related atrophy. Low-attenuation changes in the deep white matter consistent with small vessel ischemia. Vascular: No hyperdense vessel or unexpected calcification. Skull: The skull is intact. No fracture or focal lesion identified. Sinuses/Orbits: The visualized paranasal sinuses and mastoid air cells are clear. The orbits and globes intact. Other: None IMPRESSION: No acute intracranial abnormality. Findings consistent with age related atrophy and chronic small vessel ischemia Prior left parietooccipital lobe infarct. Electronically Signed   By: Prudencio Pair M.D.   On: 02/14/2019 20:10   US RENAL  Result Date: 02/14/2019 CLINICAL DATA:  78 year old female with acute  renal insufficiency. EXAM: RENAL / URINARY TRACT ULTRASOUND COMPLETE COMPARISON:  Renal ultrasound dated 03/18/2016. FINDINGS: Right Kidney: Renal measurements: 8.6 x 3.9 x 3.6 cm = volume: 63 mL. Increased renal echogenicity. No hydronephrosis or shadowing stone. Left Kidney: Renal measurements: 8.0 x 3.6 x 3.1 cm = volume: 46 mL. Increased renal echogenicity. No hydronephrosis or shadowing stone. A 1 cm upper pole cyst again noted. Bladder: Appears normal for degree of bladder distention. Other: None. IMPRESSION: Increased renal echogenicity in keeping with chronic kidney disease. No hydronephrosis or shadowing stone. Electronically  Signed   By: Anner Crete M.D.   On: 02/14/2019 23:58    Microbiology: Recent Results (from the past 240 hour(s))  SARS CORONAVIRUS 2 (TAT 6-24 HRS) Nasopharyngeal Nasopharyngeal Swab     Status: None   Collection Time: 02/14/19  9:18 PM   Specimen: Nasopharyngeal Swab  Result Value Ref Range Status   SARS Coronavirus 2 NEGATIVE NEGATIVE Final    Comment: (NOTE) SARS-CoV-2 target nucleic acids are NOT DETECTED. The SARS-CoV-2 RNA is generally detectable in upper and lower respiratory specimens during the acute phase of infection. Negative results do not preclude SARS-CoV-2 infection, do not rule out co-infections with other pathogens, and should not be used as the sole basis for treatment or other patient management decisions. Negative results must be combined with clinical observations, patient history, and epidemiological information. The expected result is Negative. Fact Sheet for Patients: SugarRoll.be Fact Sheet for Healthcare Providers: https://www.woods-mathews.com/ This test is not yet approved or cleared by the [place] FDA and  has been authorized for detection and/or diagnosis of SARS-CoV-2 by FDA under an Emergency Use Authorization (EUA). This EUA will remain  in effect (meaning this test can be used) for the duration of the COVID-19 declaration under Section 56 4(b)(1) of the Act, 21 U.S.C. section 360bbb-3(b)(1), unless the authorization is terminated or revoked sooner. Performed at [facility], [place] [address], [place], [place] [address]   Culture, Urine     Status: Abnormal   Collection Time: 02/15/19 12:40 AM   Specimen: Urine, Random  Result Value Ref Range Status   Specimen Description URINE, RANDOM  Final   Special Requests   Final    NONE Performed at [facility], [place] [address], [place], [place] [address]    Culture >=100,000 COLONIES/mL KLEBSIELLA PNEUMONIAE (A)  Final   Report Status  02/17/2019 FINAL  Final   Organism ID, Bacteria KLEBSIELLA PNEUMONIAE (A)  Final      Susceptibility   Klebsiella pneumoniae - MIC*    AMPICILLIN >=32 RESISTANT Resistant     CEFAZOLIN <=4 SENSITIVE Sensitive     CEFTRIAXONE <=1 SENSITIVE Sensitive     CIPROFLOXACIN <=0.25 SENSITIVE Sensitive     GENTAMICIN <=1 SENSITIVE Sensitive     IMIPENEM <=0.25 SENSITIVE Sensitive     NITROFURANTOIN 64 INTERMEDIATE Intermediate     TRIMETH/SULFA <=20 SENSITIVE Sensitive     AMPICILLIN/SULBACTAM 4 SENSITIVE Sensitive     PIP/TAZO <=4 SENSITIVE Sensitive     * >=100,000 COLONIES/mL KLEBSIELLA PNEUMONIAE  MRSA PCR Screening     Status: None   Collection Time: 02/15/19  4:00 AM   Specimen: Nasal Mucosa; Nasopharyngeal  Result Value Ref Range Status   MRSA by PCR NEGATIVE NEGATIVE Final    Comment:        The GeneXpert MRSA Assay (FDA approved for NASAL specimens only), is one component of a comprehensive MRSA colonization surveillance program. It is not intended to  diagnose MRSA infection nor to guide or monitor treatment for MRSA infections. Performed at [facility], [place] [address] [place], [place] [address]      Labs: CBC: Recent Labs  Lab 02/14/19 1855 02/15/19 0434 02/16/19 0340 02/17/19 0423  WBC 11.1* 8.3 6.9 7.9  HGB 10.4* 9.4* 9.6* 10.2*  HCT 34.8* 30.3* 30.9* 33.0*  MCV 96.9 93.2 93.4 95.1  PLT 608* 569* 579* 671*   Basic Metabolic Panel: Recent Labs  Lab 02/14/19 1855 02/15/19 0434 02/16/19 0340 02/17/19 0423  NA 136 137 141 138  K 4.6 4.1 3.8 4.5  CL 100 102 108 108  CO2 23 22 22  19*  GLUCOSE 142* 124* 84 139*  BUN 40* 39* 26* 27*  CREATININE 3.06* 2.81* 1.58* 1.48*  CALCIUM 8.7* 8.7* 8.7* 8.3*  MG  --   --  1.9  --    Liver Function Tests: Recent Labs  Lab 02/14/19 1855  AST 17  ALT 14  ALKPHOS 77  BILITOT 0.5  PROT 6.1*  ALBUMIN 3.5   No results for input(s): LIPASE, AMYLASE in the last 168 hours. Recent Labs  Lab 02/15/19 1637    AMMONIA 21   Cardiac Enzymes: No results for input(s): CKTOTAL, CKMB, CKMBINDEX, TROPONINI in the last 168 hours. BNP (last 3 results) Recent Labs    09/28/18 0938  BNP 249.8*   CBG: No results for input(s): GLUCAP in the last 168 hours.  Time spent: 35 minutes  Signed:  Berle Mull  [facility] 02/17/2019 6:36 PM

## 2019-02-18 ENCOUNTER — Inpatient Hospital Stay (HOSPITAL_COMMUNITY)
Admission: EM | Admit: 2019-02-18 | Discharge: 2019-02-22 | DRG: 682 | Disposition: A | Discharge status: Home Health | Payer: PPO | Attending: Internal Medicine | Admitting: Internal Medicine

## 2019-02-18 ENCOUNTER — Telehealth: Payer: Self-pay | Admitting: Internal Medicine

## 2019-02-18 ENCOUNTER — Emergency Department (HOSPITAL_COMMUNITY): Payer: PPO

## 2019-02-18 ENCOUNTER — Other Ambulatory Visit: Payer: Self-pay

## 2019-02-18 DIAGNOSIS — B961 Klebsiella pneumoniae [K. pneumoniae] as the cause of diseases classified elsewhere: Secondary | ICD-10-CM | POA: Diagnosis not present

## 2019-02-18 DIAGNOSIS — N179 Acute kidney failure, unspecified: Secondary | ICD-10-CM | POA: Diagnosis present

## 2019-02-18 DIAGNOSIS — W19XXXA Unspecified fall, initial encounter: Secondary | ICD-10-CM | POA: Diagnosis present

## 2019-02-18 DIAGNOSIS — Z8249 Family history of ischemic heart disease and other diseases of the circulatory system: Secondary | ICD-10-CM

## 2019-02-18 DIAGNOSIS — Z66 Do not resuscitate: Secondary | ICD-10-CM | POA: Diagnosis not present

## 2019-02-18 DIAGNOSIS — Z801 Family history of malignant neoplasm of trachea, bronchus and lung: Secondary | ICD-10-CM

## 2019-02-18 DIAGNOSIS — Z803 Family history of malignant neoplasm of breast: Secondary | ICD-10-CM

## 2019-02-18 DIAGNOSIS — I10 Essential (primary) hypertension: Secondary | ICD-10-CM | POA: Diagnosis present

## 2019-02-18 DIAGNOSIS — N1832 Chronic kidney disease, stage 3b: Secondary | ICD-10-CM | POA: Diagnosis present

## 2019-02-18 DIAGNOSIS — M8580 Other specified disorders of bone density and structure, unspecified site: Secondary | ICD-10-CM | POA: Diagnosis present

## 2019-02-18 DIAGNOSIS — M797 Fibromyalgia: Secondary | ICD-10-CM | POA: Diagnosis not present

## 2019-02-18 DIAGNOSIS — Z20828 Contact with and (suspected) exposure to other viral communicable diseases: Secondary | ICD-10-CM | POA: Diagnosis not present

## 2019-02-18 DIAGNOSIS — Z79899 Other long term (current) drug therapy: Secondary | ICD-10-CM

## 2019-02-18 DIAGNOSIS — S81812A Laceration without foreign body, left lower leg, initial encounter: Secondary | ICD-10-CM | POA: Diagnosis not present

## 2019-02-18 DIAGNOSIS — G894 Chronic pain syndrome: Secondary | ICD-10-CM | POA: Diagnosis not present

## 2019-02-18 DIAGNOSIS — G9341 Metabolic encephalopathy: Secondary | ICD-10-CM | POA: Diagnosis present

## 2019-02-18 DIAGNOSIS — Z86718 Personal history of other venous thrombosis and embolism: Secondary | ICD-10-CM | POA: Diagnosis not present

## 2019-02-18 DIAGNOSIS — Z981 Arthrodesis status: Secondary | ICD-10-CM

## 2019-02-18 DIAGNOSIS — J9621 Acute and chronic respiratory failure with hypoxia: Secondary | ICD-10-CM | POA: Diagnosis present

## 2019-02-18 DIAGNOSIS — J45909 Unspecified asthma, uncomplicated: Secondary | ICD-10-CM | POA: Diagnosis not present

## 2019-02-18 DIAGNOSIS — R05 Cough: Secondary | ICD-10-CM

## 2019-02-18 DIAGNOSIS — Z1611 Resistance to penicillins: Secondary | ICD-10-CM | POA: Diagnosis not present

## 2019-02-18 DIAGNOSIS — J9601 Acute respiratory failure with hypoxia: Secondary | ICD-10-CM | POA: Diagnosis not present

## 2019-02-18 DIAGNOSIS — Z833 Family history of diabetes mellitus: Secondary | ICD-10-CM

## 2019-02-18 DIAGNOSIS — K219 Gastro-esophageal reflux disease without esophagitis: Secondary | ICD-10-CM | POA: Diagnosis present

## 2019-02-18 DIAGNOSIS — R600 Localized edema: Secondary | ICD-10-CM | POA: Diagnosis present

## 2019-02-18 DIAGNOSIS — Z86711 Personal history of pulmonary embolism: Secondary | ICD-10-CM

## 2019-02-18 DIAGNOSIS — N390 Urinary tract infection, site not specified: Secondary | ICD-10-CM | POA: Diagnosis not present

## 2019-02-18 DIAGNOSIS — N183 Chronic kidney disease, stage 3 unspecified: Secondary | ICD-10-CM | POA: Diagnosis not present

## 2019-02-18 DIAGNOSIS — R011 Cardiac murmur, unspecified: Secondary | ICD-10-CM

## 2019-02-18 DIAGNOSIS — Z96651 Presence of right artificial knee joint: Secondary | ICD-10-CM | POA: Diagnosis present

## 2019-02-18 DIAGNOSIS — I959 Hypotension, unspecified: Secondary | ICD-10-CM | POA: Diagnosis not present

## 2019-02-18 DIAGNOSIS — R239 Unspecified skin changes: Secondary | ICD-10-CM

## 2019-02-18 DIAGNOSIS — Z8262 Family history of osteoporosis: Secondary | ICD-10-CM

## 2019-02-18 DIAGNOSIS — G629 Polyneuropathy, unspecified: Secondary | ICD-10-CM

## 2019-02-18 DIAGNOSIS — J441 Chronic obstructive pulmonary disease with (acute) exacerbation: Secondary | ICD-10-CM

## 2019-02-18 DIAGNOSIS — Z87891 Personal history of nicotine dependence: Secondary | ICD-10-CM

## 2019-02-18 DIAGNOSIS — R0602 Shortness of breath: Secondary | ICD-10-CM | POA: Diagnosis not present

## 2019-02-18 DIAGNOSIS — Z87440 Personal history of urinary (tract) infections: Secondary | ICD-10-CM

## 2019-02-18 DIAGNOSIS — R0902 Hypoxemia: Secondary | ICD-10-CM | POA: Diagnosis not present

## 2019-02-18 DIAGNOSIS — Z888 Allergy status to other drugs, medicaments and biological substances status: Secondary | ICD-10-CM

## 2019-02-18 DIAGNOSIS — I130 Hypertensive heart and chronic kidney disease with heart failure and stage 1 through stage 4 chronic kidney disease, or unspecified chronic kidney disease: Secondary | ICD-10-CM | POA: Diagnosis not present

## 2019-02-18 DIAGNOSIS — I5032 Chronic diastolic (congestive) heart failure: Secondary | ICD-10-CM | POA: Diagnosis present

## 2019-02-18 DIAGNOSIS — R296 Repeated falls: Secondary | ICD-10-CM | POA: Diagnosis not present

## 2019-02-18 DIAGNOSIS — Z79891 Long term (current) use of opiate analgesic: Secondary | ICD-10-CM

## 2019-02-18 DIAGNOSIS — Z96642 Presence of left artificial hip joint: Secondary | ICD-10-CM | POA: Diagnosis present

## 2019-02-18 DIAGNOSIS — E860 Dehydration: Secondary | ICD-10-CM | POA: Diagnosis not present

## 2019-02-18 DIAGNOSIS — Z823 Family history of stroke: Secondary | ICD-10-CM

## 2019-02-18 DIAGNOSIS — R739 Hyperglycemia, unspecified: Secondary | ICD-10-CM | POA: Diagnosis present

## 2019-02-18 DIAGNOSIS — S81819A Laceration without foreign body, unspecified lower leg, initial encounter: Secondary | ICD-10-CM | POA: Diagnosis present

## 2019-02-18 DIAGNOSIS — I471 Supraventricular tachycardia: Secondary | ICD-10-CM | POA: Diagnosis present

## 2019-02-18 DIAGNOSIS — D631 Anemia in chronic kidney disease: Secondary | ICD-10-CM | POA: Diagnosis present

## 2019-02-18 DIAGNOSIS — Z96612 Presence of left artificial shoulder joint: Secondary | ICD-10-CM | POA: Diagnosis not present

## 2019-02-18 DIAGNOSIS — Z9181 History of falling: Secondary | ICD-10-CM

## 2019-02-18 DIAGNOSIS — R059 Cough, unspecified: Secondary | ICD-10-CM

## 2019-02-18 LAB — TRIGLYCERIDES: Triglycerides: 113 mg/dL (ref <150)

## 2019-02-18 LAB — CBC WITH DIFFERENTIAL/PLATELET
Abs Immature Granulocytes: 0.06 10*3/uL (ref 0.00–0.07)
Basophils Absolute: 0.1 10*3/uL (ref 0.0–0.1)
Basophils Relative: 1 %
Eosinophils Absolute: 0.3 10*3/uL (ref 0.0–0.5)
Eosinophils Relative: 2 %
HCT: 31.6 % — ABNORMAL LOW (ref 36.0–46.0)
Hemoglobin: 9.5 g/dL — ABNORMAL LOW (ref 12.0–15.0)
Immature Granulocytes: 1 %
Lymphocytes Relative: 9 %
Lymphs Abs: 1.1 10*3/uL (ref 0.7–4.0)
MCH: 29.1 pg (ref 26.0–34.0)
MCHC: 30.1 g/dL (ref 30.0–36.0)
MCV: 96.6 fL (ref 80.0–100.0)
Monocytes Absolute: 0.9 10*3/uL (ref 0.1–1.0)
Monocytes Relative: 7 %
Neutro Abs: 9.8 10*3/uL — ABNORMAL HIGH (ref 1.7–7.7)
Neutrophils Relative %: 80 %
Platelets: 547 10*3/uL — ABNORMAL HIGH (ref 150–400)
RBC: 3.27 MIL/uL — ABNORMAL LOW (ref 3.87–5.11)
RDW: 14.5 % (ref 11.5–15.5)
WBC: 12.2 10*3/uL — ABNORMAL HIGH (ref 4.0–10.5)
nRBC: 0 % (ref 0.0–0.2)

## 2019-02-18 LAB — COMPREHENSIVE METABOLIC PANEL
ALT: 15 U/L (ref 0–44)
AST: 25 U/L (ref 15–41)
Albumin: 3.5 g/dL (ref 3.5–5.0)
Alkaline Phosphatase: 67 U/L (ref 38–126)
Anion gap: 11 (ref 5–15)
BUN: 33 mg/dL — ABNORMAL HIGH (ref 8–23)
CO2: 21 mmol/L — ABNORMAL LOW (ref 22–32)
Calcium: 9 mg/dL (ref 8.9–10.3)
Chloride: 100 mmol/L (ref 98–111)
Creatinine, Ser: 2.13 mg/dL — ABNORMAL HIGH (ref 0.44–1.00)
GFR calc Af Amer: 25 mL/min — ABNORMAL LOW (ref >60)
GFR calc non Af Amer: 21 mL/min — ABNORMAL LOW (ref >60)
Glucose, Bld: 164 mg/dL — ABNORMAL HIGH (ref 70–99)
Potassium: 5 mmol/L (ref 3.5–5.1)
Sodium: 132 mmol/L — ABNORMAL LOW (ref 135–145)
Total Bilirubin: 0.6 mg/dL (ref 0.3–1.2)
Total Protein: 6.3 g/dL — ABNORMAL LOW (ref 6.5–8.1)

## 2019-02-18 LAB — TROPONIN I (HIGH SENSITIVITY)
Troponin I (High Sensitivity): 35 ng/L — ABNORMAL HIGH (ref <18)
Troponin I (High Sensitivity): 34 ng/L — ABNORMAL HIGH (ref <18)

## 2019-02-18 LAB — POC SARS CORONAVIRUS 2 AG -  ED: SARS Coronavirus 2 Ag: NEGATIVE

## 2019-02-18 LAB — RESPIRATORY PANEL BY RT PCR (FLU A&B, COVID)
Influenza A by PCR: NEGATIVE
Influenza B by PCR: NEGATIVE
SARS Coronavirus 2 by RT PCR: NEGATIVE

## 2019-02-18 LAB — PROCALCITONIN: Procalcitonin: 0.11 ng/mL

## 2019-02-18 LAB — FERRITIN: Ferritin: 123 ng/mL (ref 11–307)

## 2019-02-18 LAB — LACTATE DEHYDROGENASE: LDH: 246 U/L — ABNORMAL HIGH (ref 98–192)

## 2019-02-18 LAB — LACTIC ACID, PLASMA
Lactic Acid, Venous: 1.1 mmol/L (ref 0.5–1.9)
Lactic Acid, Venous: 1.6 mmol/L (ref 0.5–1.9)

## 2019-02-18 LAB — C-REACTIVE PROTEIN: CRP: 7.7 mg/dL — ABNORMAL HIGH (ref <1.0)

## 2019-02-18 LAB — BRAIN NATRIURETIC PEPTIDE: B Natriuretic Peptide: 258.6 pg/mL — ABNORMAL HIGH (ref 0.0–100.0)

## 2019-02-18 LAB — D-DIMER, QUANTITATIVE: D-Dimer, Quant: 2.9 ug/mL-FEU — ABNORMAL HIGH (ref 0.00–0.50)

## 2019-02-18 LAB — FIBRINOGEN: Fibrinogen: 614 mg/dL — ABNORMAL HIGH (ref 210–475)

## 2019-02-18 MED ORDER — ALBUTEROL SULFATE HFA 108 (90 BASE) MCG/ACT IN AERS
6.0000 | INHALATION_SPRAY | Freq: Once | RESPIRATORY_TRACT | Status: AC
  Administered 2019-02-18: 6 via RESPIRATORY_TRACT
  Filled 2019-02-18: qty 6.7

## 2019-02-18 MED ORDER — METHYLPREDNISOLONE SODIUM SUCC 125 MG IJ SOLR
80.0000 mg | Freq: Once | INTRAMUSCULAR | Status: AC
  Administered 2019-02-19: 80 mg via INTRAVENOUS
  Filled 2019-02-18: qty 2

## 2019-02-18 NOTE — Progress Notes (Signed)
Spoke to [place]; pt daughter. Daughter stated pts baseline is A&O x4. Pts daughter concerned about the rambling/ confused speech, and the open wound on the R Ankle.

## 2019-02-18 NOTE — ED Notes (Signed)
Pt becoming confused. Asking for people who are not present. Believes it's 2001. Attempted to get out of bed and pulling at line. Ronnald Nian, MD notified.

## 2019-02-18 NOTE — Telephone Encounter (Addendum)
Received message requesting call from family of Shirley [name]. Attempted to call. Went to [employer]. Left callback number.  Received callback from [place]. She mentions that her mother was recently discharged from the hospital and is currently experiencing shortness of breath with worsening oxygen requirement. Informed her that I am not part of the clinical team that managed her care but if she is having significant shortness of breath with hypoxia, she should be brought to the ED to be evaluated. Ms.Maselli's daughter expressed understanding.

## 2019-02-18 NOTE — ED Provider Notes (Signed)
[facility] EMERGENCY DEPARTMENT Provider Note   CSN: [id number] Arrival date & time: 02/18/19  1849     History Chief Complaint  Patient presents with  . Shortness of Breath    Shirley [name] is a 78 y.o. female.  The history is provided by the patient.  Shortness of Breath Severity:  Moderate Onset quality:  Gradual Timing:  Constant Progression:  Worsening Chronicity:  Recurrent Context: not URI (Hx of CHF, recently discharged yesterday after fall and UTI.)   Relieved by:  Oxygen and rest Worsened by:  Nothing Associated symptoms: cough   Associated symptoms: no abdominal pain, no chest pain, no ear pain, no fever, no rash, no sore throat, no sputum production and no vomiting   Risk factors: hx of PE/DVT   Risk factors: no hx of cancer   Risk factors comment:  Asthma/copd      Past Medical History:  Diagnosis Date  . Allergy    vasomotor rhinitis  . Anemia   . Asthma    h/o asthma and bronchitis  . BV (bacterial vaginosis)    history of frequent  . Cervical spondylosis    herniated disk protruding @ C6-7, impinging cord and left axillary root sleeve.  . Chronic pain   . Closed olecranon fracture, left, initial encounter   . Depression   . DVT of upper extremity (deep vein thrombosis) ([place])    h/o left(after wrist fracture/surgery); no residual thrombus or phlebitis by Dopplers July 2014  . Dyspnea    with exertion  . Edema   . Fibromyalgia    sees Dr.Phillips-pain management  . GERD (gastroesophageal reflux disease)    h/o  . Headache    marigraine- hormal - none in years  . Heart murmur   . Hypertension    never has been treated that daughter is aware  . Neuropathy   . Neuropathy   . Osteopenia    mild at hip (T-1.3)  . PAT (paroxysmal atrial tachycardia) ([place])   . Pneumonia    years ago  . Recurrent falls 04/16/2016  . Varicose veins   . Varicose veins of both legs with edema 08/2012   Also pain; bilateral GSV dilated with  reflux, R Short SV dilated with reflux; not amenable to VNUS ablation due to tortuosity and superficial nature of veins -- recommeded referral to Dr. Eilleen Kempf @ [facility]  . Vision problem    wears glasses  . Vitamin D deficiency     Patient Active Problem List   Diagnosis Date Noted  . Fracture of hardware in spine ([place]) 02/14/2019  . Leg laceration 02/14/2019  . Dehydration 09/30/2018  . CKD (chronic kidney disease), stage III 09/28/2018  . Stroke ([place]) 09/28/2018  . Depression with anxiety 09/28/2018  . Chronic diastolic CHF (congestive heart failure) ([place]) 09/28/2018  . Right sided weakness 09/28/2018  . Asthma   . Frequent falls   . 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 OA (osteoarthritis) of hip 08/18/2016  . Pre-operative cardiovascular examination 07/16/2016  . Recurrent falls 04/16/2016  . Chronic pain   . Hypertension   . Neuropathy   . Osteopenia   . Vitamin D deficiency   . Olecranon fracture 04/15/2016  . Fall at home, initial encounter   . Fall   . Anemia   . Post-operative pain   . Fibromyalgia   . Uncomplicated asthma   . Tobacco abuse   . AKI (acute kidney injury) ([place])   .  Leukocytosis   . Other secondary hypertension   . Hyperglycemia   . Acute blood loss anemia   . Thrombocytosis ([place])   . Closed fracture of left olecranon process   . Acute renal failure ([place]) 03/18/2016  . Acute metabolic encephalopathy 32/20/2542  . Elbow fracture, left, closed, initial encounter 03/18/2016  . Acute pain of left hip 12/23/2015  . Acute lower UTI 12/23/2015  . DNR (do not resuscitate) discussion 12/23/2015  . Palliative care by specialist 12/23/2015  . Chronic pain syndrome 12/23/2015  . Left hip pain 12/23/2015  . Effusion of left hip   . Lytic bone lesions on xray   . Exertional dyspnea 09/07/2012  . Varicose veins of both lower extremities with pain 08/27/2012  . Lower extremity edema 08/27/2012  . Abnormal resting ECG findings -  sinus rhythm with PACs, and PAT 08/19/2012  . Aortic systolic murmur on examination 08/19/2012  . PAT (paroxysmal atrial tachycardia) / MAT 08/19/2012    Past Surgical History:  Procedure Laterality Date  . ABDOMINAL HYSTERECTOMY  1999   BSO for endometriosis  . APPENDECTOMY    . BACK SURGERY  age 67   ruptured disk L3-4   . CATARACT EXTRACTION  2000   B/L  . COLONOSCOPY    . FINGER SURGERY     left finger for tender rupture from fall.  Marland Kitchen FOOT SURGERY     multiple(at least 4 on right, 5 on left)-left Dr.Kerner([place])11/08,left Dr.Nunley-excision 2nd and 3rd mt heads w/artho and hammertoe surgery 4th and 5th 04/2006. left great toe IP fusion and revision of fusion of 2nd through 5th toes 12/2005. Right foot surgeries  Dr.Bednarz 04/2008 and 04/2009.  Marland Kitchen KNEE SURGERY Right    right knee replacement  . NECK SURGERY  2007   cervical  . ORIF ELBOW FRACTURE Left 03/22/2016   Procedure: OPEN REDUCTION INTERNAL FIXATION (ORIF) ELBOW/OLECRANON FRACTURE;  Surgeon: Altamese Hopatcong, MD;  Location: [facility];  Service: Orthopedics;  Laterality: Left;  . ORIF ELBOW FRACTURE Left 04/15/2016   Procedure: OPEN REDUCTION INTERNAL FIXATION (ORIF) LEFT ELBOW/OLECRANON FRACTURE;  Surgeon: Altamese Randall, MD;  Location: [facility];  Service: Orthopedics;  Laterality: Left;  . RADIOLOGY WITH ANESTHESIA Left 01/06/2016   Procedure: MRI LEFT HIP WITH AND WITHOUT;  Surgeon: Medication Radiologist, MD;  Location: [facility];  Service: Radiology;  Laterality: Left;  . REFRACTIVE SURGERY  2007   left eye  . REVERSE SHOULDER ARTHROPLASTY Left 12/31/2016   Procedure: REVERSE LEFT SHOULDER ARTHROPLASTY;  Surgeon: Netta Cedars, MD;  Location: [facility];  Service: Orthopedics;  Laterality: Left;  . SHOULDER SURGERY     x8 (4 on rt side and 4 on left side)  . SPINAL FUSION  6/09   Dr.Cohen  . STERIOD INJECTION Left 04/15/2016   Procedure: LEFT HIP STEROID INJECTION;  Surgeon: Altamese Annada, MD;  Location: [facility];  Service: Orthopedics;   Laterality: Left;  . TONSILLECTOMY AND ADENOIDECTOMY    . TOTAL HIP ARTHROPLASTY Left 08/18/2016   Procedure: LEFT TOTAL HIP ARTHROPLASTY ANTERIOR APPROACH;  Surgeon: Gaynelle Arabian, MD;  Location: [facility];  Service: Orthopedics;  Laterality: Left;  . TRANSTHORACIC ECHOCARDIOGRAM  July 2012   Normal EF 60-65% , mild concentric LVH. Grade 2 diastolic dysfunction with elevated EDP. Massive left atrial dilation. Pulmonary pressures 30-40 mm Hg; aortic sclerosis but no stenosis. Moderate TR  . TRANSTHORACIC ECHOCARDIOGRAM  03/2016   EF 65-70%. Normal wall motion. GR 1 DD. Severe LA dilation. Mild to moderate TR with mild  to moderately elevated PA pressures (44 mmHg) mild aortic calcification but no stenosis. No source of emboli   . WRIST SURGERY Left    2 surgeries left wrist after fracture(complicated by DVT)     OB History   No obstetric history on file.     Family History  Problem Relation Age of Onset  . Stroke Mother   . Osteoarthritis Mother        Died at age 58  . Heart disease Father   . Lung cancer Father   . Heart failure Father        Died at age 80  . Osteoporosis Sister   . Diabetes Brother   . Heart attack Brother 31  . Diabetes Brother   . Diabetes Brother   . Heart attack Brother 29  . Breast cancer Paternal Aunt   . Heart failure Maternal Grandmother        Died at age 70, unsure of her heart disease  . Heart failure Paternal Grandfather        Died at age 31, unsure of cardiac disease.    Social History   Tobacco Use  . Smoking status: Former Smoker    Quit date: 02/22/1958    Years since quitting: 61.0  . Smokeless tobacco: Never Used  . Tobacco comment: doesn't smoke every day  Substance Use Topics  . Alcohol use: No    Alcohol/week: 0.0 standard drinks  . Drug use: No    Home Medications Prior to Admission medications   Medication Sig Start Date End Date Taking? Authorizing Provider  acetaminophen (TYLENOL) 325 MG tablet Take 2 tablets (650 mg  total) by mouth every 6 (six) hours as needed for mild pain, fever or headache (or Fever >/= 101). 09/30/18   Eugenie Filler, MD  amitriptyline (ELAVIL) 25 MG tablet Take 12.5-50 mg by mouth at bedtime.  02/02/19   [provider]  amLODipine (NORVASC) 5 MG tablet Take 5 mg by mouth daily as needed (high blood pressure).  09/01/18   [provider]  cefdinir (OMNICEF) 300 MG capsule Take 1 capsule (300 mg total) by mouth daily for 3 days. 02/17/19 02/20/19  Lavina Hamman, MD  cyclobenzaprine (FLEXERIL) 10 MG tablet Take 10 mg by mouth 2 (two) times daily.  02/02/19   [provider]  FLUoxetine (PROZAC) 40 MG capsule Take 40 mg by mouth daily.  04/13/18   [provider]  gabapentin (NEURONTIN) 300 MG capsule Take 300-900 mg by mouth See admin instructions. Take 300 mg by mouth in the morning and take 900 mg by mouth at bedtime.    [provider]  hydrOXYzine (VISTARIL) 25 MG capsule Take 25 mg by mouth 2 (two) times daily.  01/24/19   [provider]  lansoprazole (PREVACID) 30 MG capsule Take 30 mg by mouth daily. 01/15/19   [provider]  metoprolol tartrate (LOPRESSOR) 25 MG tablet Take 1 tablet (25 mg total) by mouth 2 (two) times daily. 03/24/16   Rosita Fire, MD  oxyCODONE (ROXICODONE) 15 MG immediate release tablet Take 15 mg by mouth 2 (two) times daily.  03/30/18   [provider]  rosuvastatin (CRESTOR) 5 MG tablet Take 5 mg by mouth daily. 05/18/18   [provider]  torsemide (DEMADEX) 10 MG tablet Take 1 tablet (10 mg total) by mouth every other day as needed (for fluid). 02/17/19   Lavina Hamman, MD    Allergies    Buspirone  hcl  Review of Systems   Review of Systems  Constitutional: Positive for fatigue. Negative for chills and fever.  HENT: Negative for ear pain and sore throat.   Eyes: Negative for pain and visual disturbance.  Respiratory: Positive for cough and shortness of breath.  Negative for sputum production.   Cardiovascular: Negative for chest pain and palpitations.  Gastrointestinal: Negative for abdominal pain and vomiting.  Genitourinary: Negative for dysuria and hematuria.  Musculoskeletal: Negative for arthralgias and back pain.  Skin: Negative for color change and rash.  Neurological: Negative for seizures and syncope.  All other systems reviewed and are negative.   Physical Exam Updated Vital Signs  ED Triage Vitals  Enc Vitals Group     BP 02/18/19 1915 109/63     Pulse Rate 02/18/19 1915 82     Resp 02/18/19 1915 16     Temp 02/18/19 1915 98.2 F (36.8 C)     Temp Source 02/18/19 1915 Oral     SpO2 02/18/19 1915 100 %     Weight 02/18/19 1913 160 lb (72.6 kg)     Height 02/18/19 1913 5\' 4"  (1.626 m)     Head Circumference --      Peak Flow --      Pain Score 02/18/19 1913 0     Pain Loc --      Pain Edu? --      Excl. in [place]? --     Physical Exam Vitals and nursing note reviewed.  Constitutional:      General: She is not in acute distress.    Appearance: She is well-developed. She is not ill-appearing.  HENT:     Head: Normocephalic and atraumatic.  Eyes:     Conjunctiva/sclera: Conjunctivae normal.     Pupils: Pupils are equal, round, and reactive to light.  Cardiovascular:     Rate and Rhythm: Normal rate and regular rhythm.     Pulses: Normal pulses.     Heart sounds: Normal heart sounds. No murmur.  Pulmonary:     Effort: Tachypnea present. No respiratory distress.     Breath sounds: Decreased breath sounds present.     Comments: Poor air movement Abdominal:     Palpations: Abdomen is soft.     Tenderness: There is no abdominal tenderness.  Musculoskeletal:     Cervical back: Normal range of motion and neck supple.     Right lower leg: Edema (trace) present.     Left lower leg: Edema (trace) present.  Skin:    General: Skin is warm and dry.     Capillary Refill: Capillary refill takes less than 2 seconds.      Comments: Sutured lac to left lower leg overall well appearing  Neurological:     General: No focal deficit present.     Mental Status: She is alert.  Psychiatric:        Mood and Affect: Mood normal.     ED Results / Procedures / Treatments   Labs (all labs ordered are listed, but only abnormal results are displayed) Labs Reviewed  BRAIN NATRIURETIC PEPTIDE - Abnormal; Notable for the following components:      Result Value   B Natriuretic Peptide 258.6 (*)    All other components within normal limits  CBC WITH DIFFERENTIAL/PLATELET - Abnormal; Notable for the following components:   WBC 12.2 (*)    RBC 3.27 (*)    Hemoglobin 9.5 (*)    HCT 31.6 (*)  Platelets 547 (*)    Neutro Abs 9.8 (*)    All other components within normal limits  COMPREHENSIVE METABOLIC PANEL - Abnormal; Notable for the following components:   Sodium 132 (*)    CO2 21 (*)    Glucose, Bld 164 (*)    BUN 33 (*)    Creatinine, Ser 2.13 (*)    Total Protein 6.3 (*)    GFR calc non Af Amer 21 (*)    GFR calc Af Amer 25 (*)    All other components within normal limits  LACTATE DEHYDROGENASE - Abnormal; Notable for the following components:   LDH 246 (*)    All other components within normal limits  C-REACTIVE PROTEIN - Abnormal; Notable for the following components:   CRP 7.7 (*)    All other components within normal limits  TROPONIN I (HIGH SENSITIVITY) - Abnormal; Notable for the following components:   Troponin I (High Sensitivity) 35 (*)    All other components within normal limits  CULTURE, BLOOD (ROUTINE X 2)  CULTURE, BLOOD (ROUTINE X 2)  URINE CULTURE  RESPIRATORY PANEL BY RT PCR (FLU A&B, COVID)  LACTIC ACID, PLASMA  FERRITIN  TRIGLYCERIDES  LACTIC ACID, PLASMA  PROCALCITONIN  URINALYSIS, ROUTINE W REFLEX MICROSCOPIC  D-DIMER, QUANTITATIVE (NOT AT [facility])  FIBRINOGEN  POC SARS CORONAVIRUS 2 AG -  ED  TROPONIN I (HIGH SENSITIVITY)    EKG EKG Interpretation  Date/Time:  Sunday  February 18 2019 19:32:45 EST Ventricular Rate:  79 PR Interval:    QRS Duration: 96 QT Interval:  390 QTC Calculation: 448 R Axis:   28 Text Interpretation: Sinus rhythm Left atrial enlargement Confirmed by Lennice Sites [phone]) on 02/18/2019 7:38:48 PM   Radiology DG Chest Portable 1 View  Result Date: 02/18/2019 CLINICAL DATA:  Shortness of breath. EXAM: PORTABLE CHEST 1 VIEW COMPARISON:  02/14/2019 and 09/28/2018 FINDINGS: Heart size and pulmonary vascularity are normal. The lungs are clear except for some minimal atelectasis at the right lung base, unchanged. No acute bone abnormality. IMPRESSION: No acute abnormalities. Minimal atelectasis at the right base, unchanged. Electronically Signed   By: Lorriane Shire M.D.   On: 02/18/2019 21:09    Procedures Procedures (including critical care time)  Medications Ordered in ED Medications  methylPREDNISolone sodium succinate (SOLU-MEDROL) 125 mg/2 mL injection 80 mg (has no administration in time range)  albuterol (VENTOLIN HFA) 108 (90 Base) MCG/ACT inhaler 6 puff (6 puffs Inhalation Given 02/18/19 2034)    ED Course  I have reviewed the triage vital signs and the nursing notes.  Pertinent labs & imaging results that were available during my care of the patient were reviewed by me and considered in my medical decision making (see chart for details).    MDM Rules/Calculators/A&P  Shirley [name] is an 78 year old female with history of asthma, chronic pain, fibromyalgia, hypertension who presents to the ED with shortness of breath.  Patient hypoxic in the 78s with EMS.  Was put on BiPAP.  Upon arrival under my care we have transitioned her to 2 L of oxygen.  She does not appear to have hypoxia at rest.  She does have increased work of breathing she has diminished breath sounds throughout with poor air movement.  She does have some crackly sounds being transmitted but she does speak in clear sentences.  Patient is DNR.  Was recently  discharged for a fall and UTI.  Has a history of heart failure.  Denies any chest pain.  Has had gradual shortness of breath since last night.  Has a history of DVT after surgery.  Does have sutures to her left lower extremity from fall a week ago.  Denies any fever.  Has had cough.  Possibly Covid versus CHF exacerbation versus asthma exacerbation.  Less likely PE or ACS.  Point-of-care Covid test is negative.  Will get lab work.  EKG shows sinus rhythm.  Chest x-ray overall unremarkable with some atelectasis.  BNP and troponin mildly elevated.  D-dimer hemolyzed.  Patient with worsening AKI.  No significant leukocytosis, anemia, electrolyte abnormality otherwise.  Point-of-care Covid test is negative.  Some inflammatory markers elevated.  At this time possibly reactive airway disease with some volume overload.  Patient has no obvious risk factors for PE or DVT.  Awaiting D-dimer.  However unable to perform CT scan due to poor kidney function.  Overall shortness of breath is multifactorial.  Could be Covid versus asthma exacerbation versus fluid.  Medicine team to admit for further work-up.  She has been given breathing treatments and steroids.  Will defer antibiotics at this time to primary team.  Patient with D-dimer pending.  Medicine to follow that as well.  Stable throughout my care otherwise.  This chart was dictated using voice recognition software.  Despite best efforts to proofread,  errors can occur which can change the documentation meaning.   Shirley [name] was evaluated in Emergency Department on 02/18/2019 for the symptoms described in the history of present illness. She was evaluated in the context of the global COVID-19 pandemic, which necessitated consideration that the patient might be at risk for infection with the SARS-CoV-2 virus that causes COVID-19. Institutional protocols and algorithms that pertain to the evaluation of patients at risk for COVID-19 are in a state of rapid change based  on information released by regulatory bodies including the CDC and federal and state organizations. These policies and algorithms were followed during the patient's care in the ED.   Final Clinical Impression(s) / ED Diagnoses Final diagnoses:  Acute respiratory failure with hypoxia ([place])  COPD exacerbation [facility])    Rx / DC Orders ED Discharge Orders    None       Lennice Sites, DO 02/18/19 2210

## 2019-02-18 NOTE — H&P (Signed)
History and Physical    Shirley [name] OVZ:[id number] DOB: Sep 09, 1940 DOA: 02/18/2019  PCP: Shirley Dark, PA-C  Patient coming from: Home  Chief Complaint: SOB  HPI: Shirley [name] is a 78 y.o. female with medical history significant of PAT, neuropathy, fibromyalgia, GERD, chronic pain, asthma who presents for confusion, SOB and cough.  [name] was just recently admitted to the hospital for a fall with a laceration on her leg and a UTI.  During the course of her UTI she was very confused.  She was discharged yesterday with plan to complete oral Abx at home.  When I spoke with [name] she was very confused, trying to get out of bed, had moments of lucidity and understanding of her situation, but also at times felt she needed to leave.  She was oriented to everything but the year and was redirectable.    I spoke with [name], the patient's daughter who sent her in to the hospital with EMS.  [name] reports that she was glad to have her mom home yesterday, but feels that she was not back to baseline.  She was still confused and "talking out of her head" at times.  This is very out of the ordinary for [name] unless she is ill.  The continued antibiotics did not make it to the pharmacy, so she has not been on Abx for the last 36 hours.  She fell again today and developed a cough with gurgling and SOB.  She couldn't take a deep breath or form a deep cough.  She had no production of sputum.  [name] went to [facility] and got a pulse oximitry which showed 93% on room air, progressing to 83% on room air by the afternoon and EMS was called.  EMS provided her oxygen and brought her to the ED.  Other symptoms included decreased water intake, decreased PO intake, increased sleeping and increased coughing.  Of note, [name] also completed a course of antibiotics and lansoprazole 1 month ago for a "stomach infection."  [name] also helped review [name]'s medications and she is on many psychoactive  medications which may lead to confusion.   ED Course: In the ED, she was weaned to [facility] and then to [facility].  She is confused.  Found to have worsening renal function, elevated CRP.  PCT of 0.11, WBC of 12.2 with a neutrophil predominance.  Troponin of 35, BNP of 258.  CXR only showed some mild right sided atelectasis.  BUN was 33.    Review of Systems: As per HPI otherwise 10 point review of systems negative.   Past Medical History:  Diagnosis Date  . Allergy    vasomotor rhinitis  . Anemia   . Asthma    h/o asthma and bronchitis  . BV (bacterial vaginosis)    history of frequent  . Cervical spondylosis    herniated disk protruding @ C6-7, impinging cord and left axillary root sleeve.  . Chronic pain   . Closed olecranon fracture, left, initial encounter   . Depression   . DVT of upper extremity (deep vein thrombosis) ([place])    h/o left(after wrist fracture/surgery); no residual thrombus or phlebitis by Dopplers July 2014  . Dyspnea    with exertion  . Edema   . Fibromyalgia    sees Dr.Phillips-pain management  . GERD (gastroesophageal reflux disease)    h/o  . Headache    marigraine- hormal - none in years  . Heart murmur   .  Hypertension    never has been treated that daughter is aware  . Neuropathy   . Neuropathy   . Osteopenia    mild at hip (T-1.3)  . PAT (paroxysmal atrial tachycardia) ([place])   . Pneumonia    years ago  . Recurrent falls 04/16/2016  . Varicose veins   . 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 Eilleen Kempf @ [facility]  . Vision problem    wears glasses  . Vitamin D deficiency     Past Surgical History:  Procedure Laterality Date  . ABDOMINAL HYSTERECTOMY  1999   BSO for endometriosis  . APPENDECTOMY    . BACK SURGERY  age 40   ruptured disk L3-4    . CATARACT EXTRACTION  2000   B/L  . COLONOSCOPY    . FINGER SURGERY     left finger for tender rupture from fall.  Marland Kitchen FOOT SURGERY     multiple(at least 4 on right, 5 on left)-left Dr.Kerner([place])11/08,left Dr.Nunley-excision 2nd and 3rd mt heads w/artho and hammertoe surgery 4th and 5th 04/2006. left great toe IP fusion and revision of fusion of 2nd through 5th toes 12/2005. Right foot surgeries  Dr.Bednarz 04/2008 and 04/2009.  Marland Kitchen KNEE SURGERY Right    right knee replacement  . NECK SURGERY  2007   cervical  . ORIF ELBOW FRACTURE Left 03/22/2016   Procedure: OPEN REDUCTION INTERNAL FIXATION (ORIF) ELBOW/OLECRANON FRACTURE;  Surgeon: Altamese Marion, MD;  Location: [facility];  Service: Orthopedics;  Laterality: Left;  . ORIF ELBOW FRACTURE Left 04/15/2016   Procedure: OPEN REDUCTION INTERNAL FIXATION (ORIF) LEFT ELBOW/OLECRANON FRACTURE;  Surgeon: Altamese Greenfield, MD;  Location: [facility];  Service: Orthopedics;  Laterality: Left;  . RADIOLOGY WITH ANESTHESIA Left 01/06/2016   Procedure: MRI LEFT HIP WITH AND WITHOUT;  Surgeon: Medication Radiologist, MD;  Location: [place];  Service: Radiology;  Laterality: Left;  . REFRACTIVE SURGERY  2007   left eye  . REVERSE SHOULDER ARTHROPLASTY Left 12/31/2016   Procedure: REVERSE LEFT SHOULDER ARTHROPLASTY;  Surgeon: Netta Cedars, MD;  Location: [facility];  Service: Orthopedics;  Laterality: Left;  . SHOULDER SURGERY     x8 (4 on rt side and 4 on left side)  . SPINAL FUSION  6/09   Dr.Cohen  . STERIOD INJECTION Left 04/15/2016   Procedure: LEFT HIP STEROID INJECTION;  Surgeon: Altamese Pineville, MD;  Location: [facility];  Service: Orthopedics;  Laterality: Left;  . TONSILLECTOMY AND ADENOIDECTOMY    . TOTAL HIP ARTHROPLASTY Left 08/18/2016   Procedure: LEFT TOTAL HIP ARTHROPLASTY ANTERIOR APPROACH;  Surgeon: Gaynelle Arabian, MD;  Location: [facility];  Service: Orthopedics;  Laterality: Left;  . TRANSTHORACIC ECHOCARDIOGRAM  July 2012   Normal EF 60-65% , mild concentric LVH.  Grade 2 diastolic dysfunction with elevated EDP. Massive left atrial dilation. Pulmonary pressures 30-40 mm Hg; aortic sclerosis but no stenosis. Moderate TR  . TRANSTHORACIC ECHOCARDIOGRAM  03/2016   EF 65-70%. Normal wall motion. GR 1 DD. Severe LA dilation. Mild to moderate TR with mild to moderately elevated PA pressures (44 mmHg) mild aortic calcification but no stenosis. No source of emboli   . WRIST SURGERY Left    2 surgeries left wrist after fracture(complicated by DVT)    reports that she quit smoking about 61  years ago. She has never used smokeless tobacco. She reports that she does not drink alcohol or use drugs.  Allergies  Allergen Reactions  . Buspirone Hcl Other (See Comments)    Headaches, twitching   No new history per patient.  Family History  Problem Relation Age of Onset  . Stroke Mother   . Osteoarthritis Mother        Died at age 42  . Heart disease Father   . Lung cancer Father   . Heart failure Father        Died at age 60  . Osteoporosis Sister   . Diabetes Brother   . Heart attack Brother 47  . Diabetes Brother   . Diabetes Brother   . Heart attack Brother 66  . Breast cancer Paternal Aunt   . Heart failure Maternal Grandmother        Died at age 81, unsure of her heart disease  . Heart failure Paternal Grandfather        Died at age 18, unsure of cardiac disease.    Prior to Admission medications   Medication Sig Start Date End Date Taking? Authorizing Provider  acetaminophen (TYLENOL) 325 MG tablet Take 2 tablets (650 mg total) by mouth every 6 (six) hours as needed for mild pain, fever or headache (or Fever >/= 101). 09/30/18   Eugenie Filler, MD  amitriptyline (ELAVIL) 25 MG tablet Take 12.5-50 mg by mouth at bedtime.  02/02/19   [provider]  amLODipine (NORVASC) 5 MG tablet Take 5 mg by mouth daily as needed (high blood pressure).  09/01/18   [provider]  cefdinir (OMNICEF) 300 MG capsule Take 1 capsule (300 mg total)  by mouth daily for 3 days. 02/17/19 02/20/19  Lavina Hamman, MD  cyclobenzaprine (FLEXERIL) 10 MG tablet Take 10 mg by mouth 2 (two) times daily.  02/02/19   [provider]  FLUoxetine (PROZAC) 40 MG capsule Take 40 mg by mouth daily.  04/13/18   [provider]  gabapentin (NEURONTIN) 300 MG capsule Take 300-900 mg by mouth See admin instructions. Take 300 mg by mouth in the morning and take 900 mg by mouth at bedtime.    [provider]  hydrOXYzine (VISTARIL) 25 MG capsule Take 25 mg by mouth 2 (two) times daily.  01/24/19   [provider]  lansoprazole (PREVACID) 30 MG capsule Take 30 mg by mouth daily. 01/15/19   [provider]  metoprolol tartrate (LOPRESSOR) 25 MG tablet Take 1 tablet (25 mg total) by mouth 2 (two) times daily. 03/24/16   Rosita Fire, MD  oxyCODONE (ROXICODONE) 15 MG immediate release tablet Take 15 mg by mouth 2 (two) times daily.  03/30/18   [provider]  rosuvastatin (CRESTOR) 5 MG tablet Take 5 mg by mouth daily. 05/18/18   [provider]  torsemide (DEMADEX) 10 MG tablet Take 1 tablet (10 mg total) by mouth every other day as needed (for fluid). 02/17/19   Lavina Hamman, MD    Physical Exam:  Constitutional: Lying in bed, very fidgety and moving continuously in bed Vitals:   02/18/19 2130 02/18/19 2200 02/18/19 2230 02/18/19 2231  BP: (!) 107/56 117/66 (!) 119/91   Pulse:   89   Resp: (!) 24 16 20    Temp:    98.1 F (36.7 C)  TempSrc:    Oral  SpO2:   99%   Weight:      Height:  Eyes: PERRL, but sluggish to react.  Lids and conjunctivae normal.  ENMT: Mucous membranes are dry. Posterior pharynx clear of any exudate or lesions. Neck: normal, supple, no masses, no thyromegaly Respiratory: Clear at bases.  No gurgling when I saw her.  No wheezing Cardiovascular: RR, NR, + systolic murmur.  She has pedal edema to the mid calf, intact pedal pulses Abdomen:  +BS, NT, ND, no new  wounds or rash on abdomen Musculoskeletal: Normal muscle tone, she has chronic changes to the joints of the hands with enlarged nodes Skin: She has a 4cm closed laceration with surrounding erythema on her inner left calf, she has some chronic skin changes and decreased skin turgor Neurologic: Grossly intact, but moving erratically in bed.  She is oriented to person, place, situation, but also confused at times  Psychiatric: Anxious mood, poor insight and judgement   Labs on Admission: I have personally reviewed following labs and imaging studies  CBC: Recent Labs  Lab 02/14/19 1855 02/15/19 0434 02/16/19 0340 02/17/19 0423 02/18/19 1959  WBC 11.1* 8.3 6.9 7.9 12.2*  NEUTROABS  --   --   --   --  9.8*  HGB 10.4* 9.4* 9.6* 10.2* 9.5*  HCT 34.8* 30.3* 30.9* 33.0* 31.6*  MCV 96.9 93.2 93.4 95.1 96.6  PLT 608* 569* 579* 644* 659*   Basic Metabolic Panel: Recent Labs  Lab 02/14/19 1855 02/15/19 0434 02/16/19 0340 02/17/19 0423 02/18/19 1959  NA 136 137 141 138 132*  K 4.6 4.1 3.8 4.5 5.0  CL 100 102 108 108 100  CO2 23 22 22  19* 21*  GLUCOSE 142* 124* 84 139* 164*  BUN 40* 39* 26* 27* 33*  CREATININE 3.06* 2.81* 1.58* 1.48* 2.13*  CALCIUM 8.7* 8.7* 8.7* 8.3* 9.0  MG  --   --  1.9  --   --    GFR: Estimated Creatinine Clearance: 20.6 mL/min (A) (by C-G formula based on SCr of 2.13 mg/dL (H)). Liver Function Tests: Recent Labs  Lab 02/14/19 1855 02/18/19 1959  AST 17 25  ALT 14 15  ALKPHOS 77 67  BILITOT 0.5 0.6  PROT 6.1* 6.3*  ALBUMIN 3.5 3.5   No results for input(s): LIPASE, AMYLASE in the last 168 hours. Recent Labs  Lab 02/15/19 1637  AMMONIA 21   Coagulation Profile: No results for input(s): INR, PROTIME in the last 168 hours. Cardiac Enzymes: No results for input(s): CKTOTAL, CKMB, CKMBINDEX, TROPONINI in the last 168 hours. BNP (last 3 results) No results for input(s): PROBNP in the last 8760 hours. HbA1C: No results for input(s): HGBA1C in the  last 72 hours. CBG: No results for input(s): GLUCAP in the last 168 hours. Lipid Profile: Recent Labs    02/18/19 1956  TRIG 113   Thyroid Function Tests: No results for input(s): TSH, T4TOTAL, FREET4, T3FREE, THYROIDAB in the last 72 hours. Anemia Panel: Recent Labs    02/18/19 1959  FERRITIN 123   Urine analysis:    Component Value Date/Time   COLORURINE YELLOW 02/14/2019 2156   APPEARANCEUR HAZY (A) 02/14/2019 2156   LABSPEC 1.011 02/14/2019 2156   PHURINE 5.0 02/14/2019 2156   GLUCOSEU NEGATIVE 02/14/2019 2156   HGBUR NEGATIVE 02/14/2019 2156   [place] NEGATIVE 02/14/2019 2156   [place] NEGATIVE 02/14/2019 2156   PROTEINUR 30 (A) 02/14/2019 2156   NITRITE POSITIVE (A) 02/14/2019 2156   LEUKOCYTESUR MODERATE (A) 02/14/2019 2156    Radiological Exams on Admission: DG Chest Portable 1 View  Result Date: 02/18/2019  CLINICAL DATA:  Shortness of breath. EXAM: PORTABLE CHEST 1 VIEW COMPARISON:  02/14/2019 and 09/28/2018 FINDINGS: Heart size and pulmonary vascularity are normal. The lungs are clear except for some minimal atelectasis at the right lung base, unchanged. No acute bone abnormality. IMPRESSION: No acute abnormalities. Minimal atelectasis at the right base, unchanged. Electronically Signed   By: Lorriane Shire M.D.   On: 02/18/2019 21:09    EKG: Independently reviewed. TWI AVL, sinus rhythm, no apparent ST changes.   Assessment/Plan AKI (acute kidney injury) ([place]) CKD (chronic kidney disease), stage III Dehydration Klebsiella UTI - Patient with difficult time keeping up with fluid intake, she is also on torsemide for diastolic CHF and lower extremity edema - She has an increase in her Cr to 2.13 which is above baseline of 1.1 - 1.3 - IVF with NS at 100cc/hr for 12 hours, monitor breathing, CXR does not show any pulmonary edema - Trend Cr and UOP - I/Os - She did not complete therapy for UTI, so will start Rocephin and check UA and UC  SOB,  Cough Asthma - Unclear cause.  DDx includes aspiration, viral pneumonia, bacterial pneumonia, asthma exacerbation, intoxication.  By the time I saw her she was breathing comfortably, no wheezing or gurgling - CXR not revealing of a source - COVID POC test negative - Respiratory viral panel pending - D dimer pending - TnI was mildly elevated, EKG unchanged, trend - Telemetry - WBC elevated, BC X 2 pending - Monitor for fever - Check VBG for pH - Continue droplet precautions until influenza test returns  Acute metabolic encephalopathy - Likely related to above, infectious or related to hypoxia - Monitor, treat any infections    PAT (paroxysmal atrial tachycardia) / MAT - Continue home metoprolol - Telemetry - Trend Troponin    Lower extremity edema Chronic diastolic CHF (congestive heart failure)  - She has some persistent edema, but no pulmonary edema on CXR - Last echo from 2018 showed only grade 1 dysfunction - Hold torsemide given renal failure    Chronic pain syndrome Fibromyalgia - Given age and renal function, confusion/encephalopathy could be due to medication effects - Continue flexeril  - Hold scheduled oxycodone - prn only for pain - Continue gabapentin at lower dose - Continue prozac - Decrease amitriptyline to 12.5mg    Hypertension - Continue metoprolol - She is only on amlodipine PRN - if elevated pressures, would restart this  Recurrent falls - PT evaluation    Leg laceration - Sutures placed 4 days ago, if still in house can likely come out in 2-3 days.    DVT prophylaxis: Lovenox Code Status: DNR, confirmed with daughter  Family Communication: [name], daughter - we had a long discussion  Disposition Plan: Admit for fluids and evaluation of encephalopathy  Consults called: None  Admission status: INP, telemetry   Gilles Chiquito MD [facility]  If 7PM-7AM, please contact night-coverage [email or web address] Password [email or web address]  02/18/2019,  10:58 PM

## 2019-02-18 NOTE — ED Notes (Signed)
Ddimer and fibrinogen hemolyzed, needs to be recollected

## 2019-02-18 NOTE — ED Triage Notes (Signed)
Pt able to speak full has auddible coarse crackles, but denies any CP or dyspnea at rest. Hx of previous stroke and back problems and has chronic lean to the right.

## 2019-02-18 NOTE — ED Triage Notes (Signed)
Pt in from home with c/o increasing dyspnea, and rattling cough.  Alert and oriented on NRB on arrival.  States 2 days ago at her ED visit had no SOB.

## 2019-02-19 LAB — BASIC METABOLIC PANEL
Anion gap: 12 (ref 5–15)
BUN: 32 mg/dL — ABNORMAL HIGH (ref 8–23)
CO2: 20 mmol/L — ABNORMAL LOW (ref 22–32)
Calcium: 9.1 mg/dL (ref 8.9–10.3)
Chloride: 103 mmol/L (ref 98–111)
Creatinine, Ser: 2.23 mg/dL — ABNORMAL HIGH (ref 0.44–1.00)
GFR calc Af Amer: 23 mL/min — ABNORMAL LOW (ref >60)
GFR calc non Af Amer: 20 mL/min — ABNORMAL LOW (ref >60)
Glucose, Bld: 164 mg/dL — ABNORMAL HIGH (ref 70–99)
Potassium: 4.6 mmol/L (ref 3.5–5.1)
Sodium: 135 mmol/L (ref 135–145)

## 2019-02-19 LAB — CBC
HCT: 30.1 % — ABNORMAL LOW (ref 36.0–46.0)
Hemoglobin: 9.1 g/dL — ABNORMAL LOW (ref 12.0–15.0)
MCH: 29.1 pg (ref 26.0–34.0)
MCHC: 30.2 g/dL (ref 30.0–36.0)
MCV: 96.2 fL (ref 80.0–100.0)
Platelets: 483 10*3/uL — ABNORMAL HIGH (ref 150–400)
RBC: 3.13 MIL/uL — ABNORMAL LOW (ref 3.87–5.11)
RDW: 14.6 % (ref 11.5–15.5)
WBC: 11.4 10*3/uL — ABNORMAL HIGH (ref 4.0–10.5)
nRBC: 0 % (ref 0.0–0.2)

## 2019-02-19 LAB — FERRITIN: Ferritin: 109 ng/mL (ref 11–307)

## 2019-02-19 LAB — URINALYSIS, ROUTINE W REFLEX MICROSCOPIC
Bilirubin Urine: NEGATIVE
Glucose, UA: NEGATIVE mg/dL
Hgb urine dipstick: NEGATIVE
Ketones, ur: NEGATIVE mg/dL
Leukocytes,Ua: NEGATIVE
Nitrite: NEGATIVE
Protein, ur: NEGATIVE mg/dL
Specific Gravity, Urine: 1.009 (ref 1.005–1.030)
pH: 5 (ref 5.0–8.0)

## 2019-02-19 LAB — IRON AND TIBC
Iron: 23 ug/dL — ABNORMAL LOW (ref 28–170)
Saturation Ratios: 9 % — ABNORMAL LOW (ref 10.4–31.8)
TIBC: 258 ug/dL (ref 250–450)
UIBC: 235 ug/dL

## 2019-02-19 LAB — TSH: TSH: 2.515 u[IU]/mL (ref 0.350–4.500)

## 2019-02-19 LAB — STREP PNEUMONIAE URINARY ANTIGEN: Strep Pneumo Urinary Antigen: NEGATIVE

## 2019-02-19 LAB — GLUCOSE, CAPILLARY: Glucose-Capillary: 181 mg/dL — ABNORMAL HIGH (ref 70–99)

## 2019-02-19 LAB — HEMOGLOBIN A1C
Hgb A1c MFr Bld: 6.4 % — ABNORMAL HIGH (ref 4.8–5.6)
Mean Plasma Glucose: 136.98 mg/dL

## 2019-02-19 LAB — VITAMIN B12: Vitamin B-12: 945 pg/mL — ABNORMAL HIGH (ref 180–914)

## 2019-02-19 LAB — TROPONIN I (HIGH SENSITIVITY): Troponin I (High Sensitivity): 32 ng/L — ABNORMAL HIGH (ref <18)

## 2019-02-19 LAB — VITAMIN B1: Vitamin B1 (Thiamine): 111.4 nmol/L (ref 66.5–200.0)

## 2019-02-19 MED ORDER — SODIUM CHLORIDE 0.9 % IV SOLN
INTRAVENOUS | Status: DC | PRN
  Administered 2019-02-19: 500 mL via INTRAVENOUS

## 2019-02-19 MED ORDER — ENOXAPARIN SODIUM 30 MG/0.3ML ~~LOC~~ SOLN
30.0000 mg | SUBCUTANEOUS | Status: DC
  Administered 2019-02-19 – 2019-02-20 (×2): 30 mg via SUBCUTANEOUS
  Filled 2019-02-19 (×2): qty 0.3

## 2019-02-19 MED ORDER — SODIUM CHLORIDE 0.9 % IV SOLN
1.0000 g | Freq: Once | INTRAVENOUS | Status: AC
  Administered 2019-02-19: 1 g via INTRAVENOUS
  Filled 2019-02-19: qty 10

## 2019-02-19 MED ORDER — ASPIRIN EC 81 MG PO TBEC
81.0000 mg | DELAYED_RELEASE_TABLET | Freq: Every day | ORAL | Status: DC
  Administered 2019-02-19 – 2019-02-22 (×4): 81 mg via ORAL
  Filled 2019-02-19 (×4): qty 1

## 2019-02-19 MED ORDER — ACETAMINOPHEN 325 MG PO TABS
650.0000 mg | ORAL_TABLET | Freq: Four times a day (QID) | ORAL | Status: DC | PRN
  Administered 2019-02-21 – 2019-02-22 (×2): 650 mg via ORAL
  Filled 2019-02-19 (×2): qty 2

## 2019-02-19 MED ORDER — ROSUVASTATIN CALCIUM 5 MG PO TABS
5.0000 mg | ORAL_TABLET | Freq: Every day | ORAL | Status: DC
  Administered 2019-02-19 – 2019-02-22 (×4): 5 mg via ORAL
  Filled 2019-02-19 (×4): qty 1

## 2019-02-19 MED ORDER — SODIUM CHLORIDE 0.9 % IV SOLN: INTRAVENOUS | Status: DC

## 2019-02-19 MED ORDER — SODIUM CHLORIDE 0.9 % IV SOLN
1.0000 g | INTRAVENOUS | Status: DC
  Administered 2019-02-20 – 2019-02-21 (×2): 1 g via INTRAVENOUS
  Filled 2019-02-19 (×2): qty 10

## 2019-02-19 MED ORDER — OXYCODONE HCL 5 MG PO TABS
5.0000 mg | ORAL_TABLET | ORAL | Status: DC | PRN
  Administered 2019-02-19 – 2019-02-22 (×6): 5 mg via ORAL
  Filled 2019-02-19 (×6): qty 1

## 2019-02-19 MED ORDER — ONDANSETRON HCL 4 MG PO TABS: 4.0000 mg | ORAL_TABLET | Freq: Four times a day (QID) | ORAL | Status: DC | PRN

## 2019-02-19 MED ORDER — METOPROLOL TARTRATE 25 MG PO TABS
25.0000 mg | ORAL_TABLET | Freq: Two times a day (BID) | ORAL | Status: DC
  Administered 2019-02-19 – 2019-02-22 (×8): 25 mg via ORAL
  Filled 2019-02-19 (×8): qty 1

## 2019-02-19 MED ORDER — PANTOPRAZOLE SODIUM 40 MG PO TBEC
40.0000 mg | DELAYED_RELEASE_TABLET | Freq: Every day | ORAL | Status: DC
  Administered 2019-02-19 – 2019-02-22 (×4): 40 mg via ORAL
  Filled 2019-02-19 (×4): qty 1

## 2019-02-19 MED ORDER — CYCLOBENZAPRINE HCL 10 MG PO TABS
10.0000 mg | ORAL_TABLET | Freq: Two times a day (BID) | ORAL | Status: DC
  Administered 2019-02-19 – 2019-02-22 (×8): 10 mg via ORAL
  Filled 2019-02-19 (×8): qty 1

## 2019-02-19 MED ORDER — AMITRIPTYLINE HCL 25 MG PO TABS
12.5000 mg | ORAL_TABLET | Freq: Every day | ORAL | Status: DC
  Administered 2019-02-19 – 2019-02-21 (×4): 12.5 mg via ORAL
  Filled 2019-02-19 (×5): qty 0.5

## 2019-02-19 MED ORDER — FLUOXETINE HCL 20 MG PO CAPS
40.0000 mg | ORAL_CAPSULE | Freq: Every day | ORAL | Status: DC
  Administered 2019-02-19 – 2019-02-22 (×4): 40 mg via ORAL
  Filled 2019-02-19 (×4): qty 2

## 2019-02-19 MED ORDER — ONDANSETRON HCL 4 MG/2ML IJ SOLN: 4.0000 mg | Freq: Four times a day (QID) | INTRAMUSCULAR | Status: DC | PRN

## 2019-02-19 MED ORDER — SODIUM CHLORIDE 0.9 % IV SOLN: INTRAVENOUS | Status: AC

## 2019-02-19 MED ORDER — GABAPENTIN 300 MG PO CAPS
300.0000 mg | ORAL_CAPSULE | Freq: Two times a day (BID) | ORAL | Status: DC
  Administered 2019-02-19 – 2019-02-22 (×8): 300 mg via ORAL
  Filled 2019-02-19 (×8): qty 1

## 2019-02-19 MED ORDER — SODIUM CHLORIDE 0.9% FLUSH
3.0000 mL | Freq: Two times a day (BID) | INTRAVENOUS | Status: DC
  Administered 2019-02-19 – 2019-02-21 (×4): 3 mL via INTRAVENOUS

## 2019-02-19 NOTE — ED Notes (Signed)
ED TO INPATIENT HANDOFF REPORT  ED Nurse Name and Phone #: [phone] Lauree Chandler., RN  S Name/Age/Gender Shirley [name] Points 78 y.o. female Room/Bed: 030C/030C  Code Status   Code Status: Prior  Home/SNF/Other Home Patient oriented to: self and situation Is this baseline? No   Triage Complete: Triage complete  Chief Complaint AKI (acute kidney injury) ([place]) [N17.9]  Triage Note Pt in from home with c/o increasing dyspnea, and rattling cough.  Alert and oriented on NRB on arrival.  States 2 days ago at her ED visit had no SOB.    Pt able to speak full has auddible coarse crackles, but denies any CP or dyspnea at rest. Hx of previous stroke and back problems and has chronic lean to the right.       Allergies Allergies  Allergen Reactions  . Buspirone Hcl Other (See Comments)    Headaches, twitching    Level of Care/Admitting Diagnosis ED Disposition    ED Disposition Condition Comment   Admit  Hospital Area: [facility] [[id number]]  Level of Care: Telemetry Medical [104]  Covid Evaluation: Confirmed COVID Negative  Diagnosis: AKI (acute kidney injury) [facility]) [[id number]]  Admitting Physician: Cresenciano Lick  Attending Physician: Sid Falcon [phone]  Estimated length of stay: past midnight tomorrow  Certification:: I certify this patient will need inpatient services for at least 2 midnights       B Medical/Surgery History Past Medical History:  Diagnosis Date  . Allergy    vasomotor rhinitis  . Anemia   . Asthma    h/o asthma and bronchitis  . BV (bacterial vaginosis)    history of frequent  . Cervical spondylosis    herniated disk protruding @ C6-7, impinging cord and left axillary root sleeve.  . Chronic pain   . Closed olecranon fracture, left, initial encounter   . Depression   . DVT of upper extremity (deep vein thrombosis) ([place])    h/o left(after wrist fracture/surgery); no residual thrombus or phlebitis by Dopp[place] July 2014  .  Dyspnea    with exertion  . Edema   . Fibromyalgia    sees Dr.Phillips-pain management  . GERD (gastroesophageal reflux disease)    h/o  . Headache    marigraine- hormal - none in years  . Heart murmur   . Hypertension    never has been treated that daughter is aware  . Neuropathy   . Neuropathy   . Osteopenia    mild at hip (T-1.3)  . PAT (paroxysmal atrial tachycardia) ([place])   . Pneumonia    years ago  . Recurrent falls 04/16/2016  . Varicose veins   . 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 Eilleen Kempf @ [facility]  . Vision problem    wears glasses  . Vitamin D deficiency    Past Surgical History:  Procedure Laterality Date  . ABDOMINAL HYSTERECTOMY  1999   BSO for endometriosis  . APPENDECTOMY    . BACK SURGERY  age 85   ruptured disk L3-4   . CATARACT EXTRACTION  2000   B/L  . COLONOSCOPY    . FINGER SURGERY     left finger for tender rupture from fall.  Marland Kitchen FOOT SURGERY     multiple(at least 4 on  right, 5 on left)-left Dr.Kerner([place])11/08,left Dr.Nunley-excision 2nd and 3rd mt heads w/artho and hammertoe surgery 4th and 5th 04/2006. left great toe IP fusion and revision of fusion of 2nd through 5th toes 12/2005. Right foot surgeries  Dr.Bednarz 04/2008 and 04/2009.  Marland Kitchen KNEE SURGERY Right    right knee replacement  . NECK SURGERY  2007   cervical  . ORIF ELBOW FRACTURE Left 03/22/2016   Procedure: OPEN REDUCTION INTERNAL FIXATION (ORIF) ELBOW/OLECRANON FRACTURE;  Surgeon: Altamese Sumrall, MD;  Location: [place];  Service: Orthopedics;  Laterality: Left;  . ORIF ELBOW FRACTURE Left 04/15/2016   Procedure: OPEN REDUCTION INTERNAL FIXATION (ORIF) LEFT ELBOW/OLECRANON FRACTURE;  Surgeon: Altamese Rockford, MD;  Location: [place];  Service: Orthopedics;  Laterality: Left;  .  RADIOLOGY WITH ANESTHESIA Left 01/06/2016   Procedure: MRI LEFT HIP WITH AND WITHOUT;  Surgeon: Medication Radiologist, MD;  Location: [place];  Service: Radiology;  Laterality: Left;  . REFRACTIVE SURGERY  2007   left eye  . REVERSE SHOULDER ARTHROPLASTY Left 12/31/2016   Procedure: REVERSE LEFT SHOULDER ARTHROPLASTY;  Surgeon: Netta Cedars, MD;  Location: [place];  Service: Orthopedics;  Laterality: Left;  . SHOULDER SURGERY     x8 (4 on rt side and 4 on left side)  . SPINAL FUSION  6/09   Dr.Cohen  . STERIOD INJECTION Left 04/15/2016   Procedure: LEFT HIP STEROID INJECTION;  Surgeon: Altamese Paramount-Long Meadow, MD;  Location: [place];  Service: Orthopedics;  Laterality: Left;  . TONSILLECTOMY AND ADENOIDECTOMY    . TOTAL HIP ARTHROPLASTY Left 08/18/2016   Procedure: LEFT TOTAL HIP ARTHROPLASTY ANTERIOR APPROACH;  Surgeon: Gaynelle Arabian, MD;  Location: [facility];  Service: Orthopedics;  Laterality: Left;  . TRANSTHORACIC ECHOCARDIOGRAM  July 2012   Normal EF 60-65% , mild concentric LVH. Grade 2 diastolic dysfunction with elevated EDP. Massive left atrial dilation. Pulmonary pressures 30-40 mm Hg; aortic sclerosis but no stenosis. Moderate TR  . TRANSTHORACIC ECHOCARDIOGRAM  03/2016   EF 65-70%. Normal wall motion. GR 1 DD. Severe LA dilation. Mild to moderate TR with mild to moderately elevated PA pressures (44 mmHg) mild aortic calcification but no stenosis. No source of emboli   . WRIST SURGERY Left    2 surgeries left wrist after fracture(complicated by DVT)     A IV Location/Drains/Wounds Patient Lines/Drains/Airways Status   Active Line/Drains/Airways    Name:   Placement date:   Placement time:   Site:   Days:   Peripheral IV 02/18/19 Left;Medial Forearm   02/18/19    2153    Forearm   1   Incision (Closed) 02/15/19 Leg Left   02/15/19    0200     4          Intake/Output Last 24 hours No intake or output data in the 24 hours ending 02/19/19 0003  Labs/Imaging Results for orders placed or  performed during the hospital encounter of 02/18/19 (from the past 48 hour(s))  POC SARS Coronavirus 2 Ag-ED - Nasal Swab (BD Veritor Kit)     Status: None   Collection Time: 02/18/19  7:53 PM  Result Value Ref Range   SARS Coronavirus 2 Ag NEGATIVE NEGATIVE    Comment: (NOTE) SARS-CoV-2 antigen NOT DETECTED.  Negative results are presumptive.  Negative results do not preclude SARS-CoV-2 infection and should not be used as the sole basis for treatment or other patient management decisions, including infection  control decisions, particularly in the presence of clinical signs and  symptoms consistent with  COVID-19, or in those who have been in contact with the virus.  Negative results must be combined with clinical observations, patient history, and epidemiological information. The expected result is Negative. Fact Sheet for Patients: PodPark.tn Fact Sheet for Healthcare Providers: GiftContent.is This test is not yet approved or cleared by the [place] FDA and  has been authorized for detection and/or diagnosis of SARS-CoV-2 by FDA under an Emergency Use Authorization (EUA).  This EUA will remain in effect (meaning this test can be used) for the duration of  the COVID-19 de claration under Section 564(b)(1) of the Act, 21 U.S.C. section 360bbb-3(b)(1), unless the authorization is terminated or revoked sooner.   Brain natriuretic peptide     Status: Abnormal   Collection Time: 02/18/19  7:56 PM  Result Value Ref Range   B Natriuretic Peptide 258.6 (H) 0.0 - 100.0 pg/mL    Comment: Performed at [facility] [address] [place], [place] [address]  Lactic acid, plasma     Status: None   Collection Time: 02/18/19  7:56 PM  Result Value Ref Range   Lactic Acid, Venous 1.1 0.5 - 1.9 mmol/L    Comment: Performed at [facility] [address] [place], [place] [address]  Triglycerides     Status: None    Collection Time: 02/18/19  7:56 PM  Result Value Ref Range   Triglycerides 113 <150 mg/dL    Comment: Performed at [facility] [address] [place], [place] [address]  Troponin I (High Sensitivity)     Status: Abnormal   Collection Time: 02/18/19  7:59 PM  Result Value Ref Range   Troponin I (High Sensitivity) 35 (H) <18 ng/L    Comment: (NOTE) Elevated high sensitivity troponin I (hsTnI) values and significant  changes across serial measurements may suggest ACS but many other  chronic and acute conditions are known to elevate hsTnI results.  Refer to the "Links" section for chest pain algorithms and additional  guidance. Performed at [facility], [place] [address], [place], [place] [address]   CBC WITH DIFFERENTIAL     Status: Abnormal   Collection Time: 02/18/19  7:59 PM  Result Value Ref Range   WBC 12.2 (H) 4.0 - 10.5 K/uL   RBC 3.27 (L) 3.87 - 5.11 MIL/uL   Hemoglobin 9.5 (L) 12.0 - 15.0 g/dL   HCT 31.6 (L) 36.0 - 46.0 %   MCV 96.6 80.0 - 100.0 fL   MCH 29.1 26.0 - 34.0 pg   MCHC 30.1 30.0 - 36.0 g/dL   RDW 14.5 11.5 - 15.5 %   Platelets 547 (H) 150 - 400 K/uL   nRBC 0.0 0.0 - 0.2 %   Neutrophils Relative % 80 %   Neutro Abs 9.8 (H) 1.7 - 7.7 K/uL   Lymphocytes Relative 9 %   Lymphs Abs 1.1 0.7 - 4.0 K/uL   Monocytes Relative 7 %   Monocytes Absolute 0.9 0.1 - 1.0 K/uL   Eosinophils Relative 2 %   Eosinophils Absolute 0.3 0.0 - 0.5 K/uL   Basophils Relative 1 %   Basophils Absolute 0.1 0.0 - 0.1 K/uL   Immature Granulocytes 1 %   Abs Immature Granulocytes 0.06 0.00 - 0.07 K/uL    Comment: Performed at [facility], [place] [address], [place], [place] [address]  Comprehensive metabolic panel     Status: Abnormal   Collection Time: 02/18/19  7:59 PM  Result Value Ref Range  Sodium 132 (L) 135 - 145 mmol/L   Potassium 5.0 3.5 - 5.1 mmol/L   Chloride 100 98 - 111 mmol/L   CO2 21 (L) 22 - 32 mmol/L   Glucose, Bld 164 (H) 70 - 99 mg/dL   BUN 33 (H)  8 - 23 mg/dL   Creatinine, Ser 2.13 (H) 0.44 - 1.00 mg/dL   Calcium 9.0 8.9 - 10.3 mg/dL   Total Protein 6.3 (L) 6.5 - 8.1 g/dL   Albumin 3.5 3.5 - 5.0 g/dL   AST 25 15 - 41 U/L   ALT 15 0 - 44 U/L   Alkaline Phosphatase 67 38 - 126 U/L   Total Bilirubin 0.6 0.3 - 1.2 mg/dL   GFR calc non Af Amer 21 (L) >60 mL/min   GFR calc Af Amer 25 (L) >60 mL/min   Anion gap 11 5 - 15    Comment: Performed at [facility] [address], [place], [place] [address]  Procalcitonin     Status: None   Collection Time: 02/18/19  7:59 PM  Result Value Ref Range   Procalcitonin 0.11 ng/mL    Comment:        Interpretation: PCT (Procalcitonin) <= 0.5 ng/mL: Systemic infection (sepsis) is not likely. Local bacterial infection is possible. (NOTE)       Sepsis PCT Algorithm           Lower Respiratory Tract                                      Infection PCT Algorithm    ----------------------------     ----------------------------         PCT < 0.25 ng/mL                PCT < 0.10 ng/mL         Strongly encourage             Strongly discourage   discontinuation of antibiotics    initiation of antibiotics    ----------------------------     -----------------------------       PCT 0.25 - 0.50 ng/mL            PCT 0.10 - 0.25 ng/mL               OR       >80% decrease in PCT            Discourage initiation of                                            antibiotics      Encourage discontinuation           of antibiotics    ----------------------------     -----------------------------         PCT >= 0.50 ng/mL              PCT 0.26 - 0.50 ng/mL               AND        <80% decrease in PCT             Encourage initiation of  antibiotics       Encourage continuation           of antibiotics    ----------------------------     -----------------------------        PCT >= 0.50 ng/mL                  PCT > 0.50 ng/mL               AND         increase  in PCT                  Strongly encourage                                      initiation of antibiotics    Strongly encourage escalation           of antibiotics                                     -----------------------------                                           PCT <= 0.25 ng/mL                                                 OR                                        > 80% decrease in PCT                                     Discontinue / Do not initiate                                             antibiotics Performed at [facility], [address], [place], [place] [address]   Lactate dehydrogenase     Status: Abnormal   Collection Time: 02/18/19  7:59 PM  Result Value Ref Range   LDH 246 (H) 98 - 192 U/L    Comment: Performed at [facility] [address] [place], [place] [address]  Ferritin     Status: None   Collection Time: 02/18/19  7:59 PM  Result Value Ref Range   Ferritin 123 11 - 307 ng/mL    Comment: Performed at [facility], [place] [address], [place], [place] [address]  C-reactive protein     Status: Abnormal   Collection Time: 02/18/19  7:59 PM  Result Value Ref Range   CRP 7.7 (H) <1.0 mg/dL    Comment: Performed at [facility] [address], [place], [place] [address]  Respiratory Panel by RT PCR (Flu A&B, Covid) - Nasopharyngeal Swab     Status: None  Collection Time: 02/18/19  9:21 PM   Specimen: Nasopharyngeal Swab  Result Value Ref Range   SARS Coronavirus 2 by RT PCR NEGATIVE NEGATIVE    Comment: (NOTE) SARS-CoV-2 target nucleic acids are NOT DETECTED. The SARS-CoV-2 RNA is generally detectable in upper respiratoy specimens during the acute phase of infection. The lowest concentration of SARS-CoV-2 viral copies this assay can detect is 131 copies/mL. A negative result does not preclude SARS-Cov-2 infection and should not be used as the sole basis for treatment or other patient management decisions. A negative result  may occur with  improper specimen collection/handling, submission of specimen other than nasopharyngeal swab, presence of viral mutation(s) within the areas targeted by this assay, and inadequate number of viral copies (<131 copies/mL). A negative result must be combined with clinical observations, patient history, and epidemiological information. The expected result is Negative. Fact Sheet for Patients:  PinkCheek.be Fact Sheet for Healthcare Providers:  GravelBags.it This test is not yet ap proved or cleared by the [place] FDA and  has been authorized for detection and/or diagnosis of SARS-CoV-2 by FDA under an Emergency Use Authorization (EUA). This EUA will remain  in effect (meaning this test can be used) for the duration of the COVID-19 declaration under Section 564(b)(1) of the Act, 21 U.S.C. section 360bbb-3(b)(1), unless the authorization is terminated or revoked sooner.    Influenza A by PCR NEGATIVE NEGATIVE   Influenza B by PCR NEGATIVE NEGATIVE    Comment: (NOTE) The Xpert Xpress SARS-CoV-2/FLU/RSV assay is intended as an aid in  the diagnosis of influenza from Nasopharyngeal swab specimens and  should not be used as a sole basis for treatment. Nasal washings and  aspirates are unacceptable for Xpert Xpress SARS-CoV-2/FLU/RSV  testing. Fact Sheet for Patients: PinkCheek.be Fact Sheet for Healthcare Providers: GravelBags.it This test is not yet approved or cleared by the [place] FDA and  has been authorized for detection and/or diagnosis of SARS-CoV-2 by  FDA under an Emergency Use Authorization (EUA). This EUA will remain  in effect (meaning this test can be used) for the duration of the  Covid-19 declaration under Section 564(b)(1) of the Act, 21  U.S.C. section 360bbb-3(b)(1), unless the authorization is  terminated or revoked. Performed at  [facility], [place] [address] [place], [place] [address]   Lactic acid, plasma     Status: None   Collection Time: 02/18/19  9:34 PM  Result Value Ref Range   Lactic Acid, Venous 1.6 0.5 - 1.9 mmol/L    Comment: Performed at [facility] [address] [place], [place] [address]  Troponin I (High Sensitivity)     Status: Abnormal   Collection Time: 02/18/19  9:34 PM  Result Value Ref Range   Troponin I (High Sensitivity) 34 (H) <18 ng/L    Comment: (NOTE) Elevated high sensitivity troponin I (hsTnI) values and significant  changes across serial measurements may suggest ACS but many other  chronic and acute conditions are known to elevate hsTnI results.  Refer to the "Links" section for chest pain algorithms and additional  guidance. Performed at [facility], [place] [address], [place], [place] [address]   D-dimer, quantitative (not at [facility])     Status: Abnormal   Collection Time: 02/18/19  9:34 PM  Result Value Ref Range   D-Dimer, Quant 2.90 (H) 0.00 - 0.50 ug/mL-FEU    Comment: (NOTE) At the manufacturer cut-off of 0.50 ug/mL FEU, this assay has been documented to  exclude PE with a sensitivity and negative predictive value of 97 to 99%.  At this time, this assay has not been approved by the FDA to exclude DVT/VTE. Results should be correlated with clinical presentation. Performed at [facility], [place] [address], [place], [place] [address]   Fibrinogen     Status: Abnormal   Collection Time: 02/18/19  9:34 PM  Result Value Ref Range   Fibrinogen 614 (H) 210 - 475 mg/dL    Comment: Performed at [facility] [address] [place], [place] [address]   DG Chest Portable 1 View  Result Date: 02/18/2019 CLINICAL DATA:  Shortness of breath. EXAM: PORTABLE CHEST 1 VIEW COMPARISON:  02/14/2019 and 09/28/2018 FINDINGS: Heart size and pulmonary vascularity are normal. The lungs are clear except for some minimal atelectasis at the right lung base,  unchanged. No acute bone abnormality. IMPRESSION: No acute abnormalities. Minimal atelectasis at the right base, unchanged. Electronically Signed   By: Lorriane Shire M.D.   On: 02/18/2019 21:09    Pending Labs Unresulted Labs (From admission, onward)    Start     Ordered   02/18/19 1911  Urine culture  ONCE - STAT,   STAT     02/18/19 1910   02/18/19 1910  Urinalysis, Routine w reflex microscopic  Once,   STAT     02/18/19 1910   02/18/19 1908  Blood Culture (routine x 2)  BLOOD CULTURE X 2,   STAT     02/18/19 1907   Signed and Held  Strep pneumoniae urinary antigen  Once,   R     Signed and Held   Signed and Held  Culture, Urine  Once,   R     Signed and Held   Signed and Held  CBC  (enoxaparin (LOVENOX)    CrCl < 30 ml/min)  Once,   R    Comments: Baseline for enoxaparin therapy IF NOT ALREADY DRAWN.  Notify MD if PLT < 100 K.   Question:  Specimen collection method  Answer:  IV Team=IV Team collect   Signed and Held   Signed and Held  Creatinine, serum  (enoxaparin (LOVENOX)    CrCl < 30 ml/min)  Once,   R    Comments: Baseline for enoxaparin therapy IF NOT ALREADY DRAWN.   Question:  Specimen collection method  Answer:  IV Team=IV Team collect   Signed and Held   Signed and Held  Creatinine, serum  (enoxaparin (LOVENOX)    CrCl < 30 ml/min)  Weekly,   R    Comments: while on enoxaparin therapy.   Question:  Specimen collection method  Answer:  IV Team=IV Team collect   Signed and Held   Signed and Held  TSH  Once,   R    Question:  Specimen collection method  Answer:  IV Team=IV Team collect   Signed and Held   Signed and Held  Basic metabolic panel  Tomorrow morning,   R    Question:  Specimen collection method  Answer:  IV Team=IV Team collect   Signed and Held   Signed and Held  CBC  Tomorrow morning,   R    Question:  Specimen collection method  Answer:  IV Team=IV Team collect   Signed and Held          Vitals/Pain Today's Vitals   02/18/19 2130 02/18/19 2200  02/18/19 2230 02/18/19 2231  BP: (!) 107/56 117/66 (!) 119/91  Pulse:   89   Resp: (!) _0 Temp:    98.1 F (36.7 C)  TempSrc:    Oral  SpO2:   99%   Weight:      Height:      PainSc:        Isolation Precautions No active isolations  Medications Medications  methylPREDNISolone sodium succinate (SOLU-MEDROL) 125 mg/2 mL injection 80 mg (has no administration in time range)  albuterol (VENTOLIN HFA) 108 (90 Base) MCG/ACT inhaler 6 puff (6 puffs Inhalation Given 02/18/19 2034)    Mobility walks with device High fall risk   Focused Assessments Pulmonary Assessment Handoff:  Lung sounds: L Breath Sounds: Coarse crackles R Breath Sounds: Coarse crackles O2 Device: Nasal Cannula O2 Flow Rate (L/min): 3 L/min      R Recommendations: See Admitting Provider Note  Report given to:   Additional Notes:

## 2019-02-19 NOTE — Progress Notes (Signed)
PT Cancellation Note  Patient Details Name: JUANETTE URIZAR MRN: [id number] DOB: 08/31/1940   Cancelled Treatment:    Reason Eval/Treat Not Completed: Active bedrest order Will await increase in activity orders prior to PT evaluation. Will follow.   Marguarite Arbour A Kamarah Bilotta 02/19/2019, 6:56 AM Marisa Severin, PT, DPT Acute Rehabilitation Services Pager [phone] Office [phone]

## 2019-02-19 NOTE — NC FL2 (Signed)
[place] LEVEL OF CARE SCREENING TOOL     IDENTIFICATION  Patient Name: Shirley [name] Birthdate: 1940-06-12 Sex: female Admission Date (Current Location): 02/18/2019  [facility] and [place] Number:  Herbalist and Address:  [facility] [place], [place] [address] [place], [place] [address]      Provider Number: [id number]  Attending Physician Name and Address:  Kayleen Memos, DO  Relative Name and Phone Number:  [name] (daughter) [phone]    Current Level of Care: Hospital Recommended Level of Care: [place] Prior Approval Number:    Date Approved/Denied:   PASRR Number: [id number]  Discharge Plan: SNF    Current Diagnoses: Patient Active Problem List   Diagnosis Date Noted  . Acute respiratory failure with hypoxia ([place])   . Fracture of hardware in spine ([place]) 02/14/2019  . Leg laceration 02/14/2019  . Dehydration 09/30/2018  . CKD (chronic kidney disease), stage III 09/28/2018  . Stroke ([place]) 09/28/2018  . Depression with anxiety 09/28/2018  . Chronic diastolic CHF (congestive heart failure) ([place]) 09/28/2018  . Right sided weakness 09/28/2018  . Asthma   . Frequent falls   . 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 OA (osteoarthritis) of hip 08/18/2016  . Pre-operative cardiovascular examination 07/16/2016  . Recurrent falls 04/16/2016  . Chronic pain   . Hypertension   . Neuropathy   . Osteopenia   . Vitamin D deficiency   . Olecranon fracture 04/15/2016  . Fall at home, initial encounter   . Fall   . Anemia   . Post-operative pain   . Fibromyalgia   . Uncomplicated asthma   . Tobacco abuse   . AKI (acute kidney injury) ([place])   . Leukocytosis   . Other secondary hypertension   . Hyperglycemia   . Acute blood loss anemia   . Thrombocytosis ([place])   . Closed fracture of left olecranon process   . Acute renal failure ([place]) 03/18/2016  . Acute metabolic encephalopathy 00/93/8182  . Elbow  fracture, left, closed, initial encounter 03/18/2016  . Acute pain of left hip 12/23/2015  . Acute lower UTI 12/23/2015  . DNR (do not resuscitate) discussion 12/23/2015  . Palliative care by specialist 12/23/2015  . Chronic pain syndrome 12/23/2015  . Left hip pain 12/23/2015  . Effusion of left hip   . Lytic bone lesions on xray   . Exertional dyspnea 09/07/2012  . Varicose veins of both lower extremities with pain 08/27/2012  . Lower extremity edema 08/27/2012  . Abnormal resting ECG findings - sinus rhythm with PACs, and PAT 08/19/2012  . Aortic systolic murmur on examination 08/19/2012  . PAT (paroxysmal atrial tachycardia) / MAT 08/19/2012    Orientation RESPIRATION BLADDER Height & Weight     Self, Place  O2(nasal cannula 3L) Incontinent, External catheter Weight: 165 lb (74.8 kg) Height:  5\' 4"  (162.6 cm)  BEHAVIORAL SYMPTOMS/MOOD NEUROLOGICAL BOWEL NUTRITION STATUS      Continent Diet(see discharge summary)  AMBULATORY STATUS COMMUNICATION OF NEEDS Skin   Extensive Assist Verbally Other (Comment), Skin abrasions(ecchymosis left hip and right leg, abrasion right hip)                       Personal Care Assistance Level of Assistance  Feeding, Bathing, Dressing, Total care Bathing Assistance: Limited assistance Feeding assistance: Independent Dressing Assistance: Limited assistance Total Care Assistance: Limited assistance   Functional Limitations Info  Sight, Hearing, Speech Sight Info: Impaired  Hearing Info: Adequate Speech Info: Adequate    SPECIAL CARE FACTORS FREQUENCY  PT (By licensed PT), OT (By licensed OT)     PT Frequency: min 5x weekly OT Frequency: min 5x weekly            Contractures Contractures Info: Not present    Additional Factors Info  Allergies, Code Status Code Status Info: DNR Allergies Info: Buspirone Hcl           Current Medications (02/19/2019):  This is the current hospital active medication list Current  Facility-Administered Medications  Medication Dose Route Frequency Provider Last Rate Last Admin  . 0.9 %  sodium chloride infusion   Intravenous PRN Sid Falcon, MD 10 mL/hr at 02/19/19 0626 Rate Verify at 02/19/19 0626  . 0.9 %  sodium chloride infusion   Intravenous Continuous Kayleen Memos, DO 60 mL/hr at 02/19/19 0844 Rate Change at 02/19/19 0844  . acetaminophen (TYLENOL) tablet 650 mg  650 mg Oral Q6H PRN Gilles Chiquito B, MD      . amitriptyline (ELAVIL) tablet 12.5 mg  12.5 mg Oral QHS Gilles Chiquito B, MD   12.5 mg at 02/19/19 0311  . aspirin EC tablet 81 mg  81 mg Oral Daily Gilles Chiquito B, MD   81 mg at 02/19/19 0849  . [START ON 02/20/2019] cefTRIAXone (ROCEPHIN) 1 g in sodium chloride 0.9 % 100 mL IVPB  1 g Intravenous Q24H Gilles Chiquito B, MD      . cyclobenzaprine (FLEXERIL) tablet 10 mg  10 mg Oral BID Sid Falcon, MD   10 mg at 02/19/19 0846  . enoxaparin (LOVENOX) injection 30 mg  30 mg Subcutaneous Q24H Gilles Chiquito B, MD   30 mg at 02/19/19 1324  . FLUoxetine (PROZAC) capsule 40 mg  40 mg Oral Daily Gilles Chiquito B, MD   40 mg at 02/19/19 0847  . gabapentin (NEURONTIN) capsule 300 mg  300 mg Oral BID Gilles Chiquito B, MD   300 mg at 02/19/19 0846  . metoprolol tartrate (LOPRESSOR) tablet 25 mg  25 mg Oral BID Sid Falcon, MD   25 mg at 02/19/19 0846  . ondansetron (ZOFRAN) tablet 4 mg  4 mg Oral Q6H PRN Sid Falcon, MD       Or  . ondansetron [facility]) injection 4 mg  4 mg Intravenous Q6H PRN Gilles Chiquito B, MD      . oxyCODONE (Oxy IR/ROXICODONE) immediate release tablet 5 mg  5 mg Oral Q4H PRN Sid Falcon, MD      . pantoprazole (PROTONIX) EC tablet 40 mg  40 mg Oral Daily Gilles Chiquito B, MD   40 mg at 02/19/19 0848  . rosuvastatin (CRESTOR) tablet 5 mg  5 mg Oral Daily Gilles Chiquito B, MD   5 mg at 02/19/19 0848  . sodium chloride flush (NS) 0.9 % injection 3 mL  3 mL Intravenous Q12H Sid Falcon, MD   3 mL at 02/19/19 0155     Discharge  Medications: Please see discharge summary for a list of discharge medications.  Relevant Imaging Results:  Relevant Lab Results:   Additional Information [id number]  Alberteen Sam, LCSW

## 2019-02-19 NOTE — Progress Notes (Signed)
Updated patient's daughter [name] via phone.  All questions answered.

## 2019-02-19 NOTE — TOC Initial Note (Signed)
Transition of Care [facility]) - Initial/Assessment Note    Patient Details  Name: Shirley [name] MRN: [id number] Date of Birth: 02/13/1941  [facility]) CM/SW Contact:    Alberteen Sam, [place] Phone Number: [phone] 02/19/2019, 4:02 PM  Clinical Narrative:                  CSW spoke with patient's daughter [name] and advised of PT recommendation of SNF for short term rehab. [name] expressed hesitations due to past negative experience at [facility]. [name] is agreeable for CSW to fax out referrals to SNFs for bed offers. She also inquired as to [facility] services as this may be an option for family. They currently have hired a caregiver that works 8am-8pm.   CSW to fax out referrals and inform [name] of bed offers.  Expected Discharge Plan: (TBD) Barriers to Discharge: Continued Medical Work up   Patient Goals and CMS Choice   CMS Medicare.gov Compare Post Acute Care list provided to:: Patient Represenative (must comment)(Jennifer (daughter)) Choice offered to / list presented to : Adult Children  Expected Discharge Plan and Services Expected Discharge Plan: (TBD)       Living arrangements for the past 2 months: Single Family Home                                      Prior Living Arrangements/Services Living arrangements for the past 2 months: Single Family Home Lives with:: Spouse Patient language and need for interpreter reviewed:: Yes Do you feel safe going back to the place where you live?: Yes      Need for Family Participation in Patient Care: Yes (Comment) Care giver support system in place?: Yes (comment)   Criminal Activity/Legal Involvement Pertinent to Current Situation/Hospitalization: No - Comment as needed  Activities of Daily Living Home Assistive Devices/Equipment: Eyeglasses, Walker (specify type) ADL Screening (condition at time of admission) Patient's cognitive ability adequate to safely complete daily activities?: No Is the patient  deaf or have difficulty hearing?: No Does the patient have difficulty seeing, even when wearing glasses/contacts?: No Does the patient have difficulty concentrating, remembering, or making decisions?: Yes Patient able to express need for assistance with ADLs?: Yes Does the patient have difficulty dressing or bathing?: Yes Independently performs ADLs?: No Communication: Independent Dressing (OT): Needs assistance Is this a change from baseline?: Pre-admission baseline Grooming: Needs assistance Is this a change from baseline?: Pre-admission baseline Feeding: Needs assistance Is this a change from baseline?: Pre-admission baseline Bathing: Needs assistance Is this a change from baseline?: Pre-admission baseline Toileting: Needs assistance Is this a change from baseline?: Pre-admission baseline In/Out Bed: Needs assistance Is this a change from baseline?: Pre-admission baseline Walks in Home: Independent with device (comment) Does the patient have difficulty walking or climbing stairs?: Yes Weakness of Legs: Both Weakness of Arms/Hands: Both  Permission Sought/Granted Permission sought to share information with : Case Manager, Customer service manager, Family Supports Permission granted to share information with : Yes, Verbal Permission Granted  Share Information with NAME: [name]  Permission granted to share info w AGENCY: SNFs  Permission granted to share info w Relationship: daughter  Permission granted to share info w Contact Information: [phone]  Emotional Assessment Appearance:: Appears stated age Attitude/Demeanor/Rapport: Unable to Assess Affect (typically observed): Unable to Assess Orientation: : Oriented to Self, Oriented to Place Alcohol / Substance Use: Not Applicable Psych Involvement: No (comment)  Admission diagnosis:  COPD exacerbation ([place]) [J44.1] Acute respiratory failure with hypoxia ([place]) [J96.01] AKI (acute kidney injury) ([place])  [N17.9] Patient Active Problem List   Diagnosis Date Noted  . Acute respiratory failure with hypoxia ([place])   . Fracture of hardware in spine ([place]) 02/14/2019  . Leg laceration 02/14/2019  . Dehydration 09/30/2018  . CKD (chronic kidney disease), stage III 09/28/2018  . Stroke ([place]) 09/28/2018  . Depression with anxiety 09/28/2018  . Chronic diastolic CHF (congestive heart failure) ([place]) 09/28/2018  . Right sided weakness 09/28/2018  . Asthma   . Frequent falls   . 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 OA (osteoarthritis) of hip 08/18/2016  . Pre-operative cardiovascular examination 07/16/2016  . Recurrent falls 04/16/2016  . Chronic pain   . Hypertension   . Neuropathy   . Osteopenia   . Vitamin D deficiency   . Olecranon fracture 04/15/2016  . Fall at home, initial encounter   . Fall   . Anemia   . Post-operative pain   . Fibromyalgia   . Uncomplicated asthma   . Tobacco abuse   . AKI (acute kidney injury) ([place])   . Leukocytosis   . Other secondary hypertension   . Hyperglycemia   . Acute blood loss anemia   . Thrombocytosis ([place])   . Closed fracture of left olecranon process   . Acute renal failure ([place]) 03/18/2016  . Acute metabolic encephalopathy 24/23/5361  . Elbow fracture, left, closed, initial encounter 03/18/2016  . Acute pain of left hip 12/23/2015  . Acute lower UTI 12/23/2015  . DNR (do not resuscitate) discussion 12/23/2015  . Palliative care by specialist 12/23/2015  . Chronic pain syndrome 12/23/2015  . Left hip pain 12/23/2015  . Effusion of left hip   . Lytic bone lesions on xray   . Exertional dyspnea 09/07/2012  . Varicose veins of both lower extremities with pain 08/27/2012  . Lower extremity edema 08/27/2012  . Abnormal resting ECG findings - sinus rhythm with PACs, and PAT 08/19/2012  . Aortic systolic murmur on examination 08/19/2012  . PAT (paroxysmal atrial tachycardia) / MAT 08/19/2012   PCP:  Ardith Dark, PA-C Pharmacy:    [id number] - , [place] [address] Phone: [phone] Fax: [phone]     Social Determinants of Health (SDOH) Interventions    Readmission Risk Interventions No flowsheet data found.

## 2019-02-19 NOTE — Plan of Care (Signed)
  Problem: Education: Goal: Knowledge of General Education information will improve Description: Including pain rating scale, medication(s)/side effects and non-pharmacologic comfort measures Outcome: Progressing   Problem: Health Behavior/Discharge Planning: Goal: Ability to manage health-related needs will improve Outcome: Progressing   Problem: Clinical Measurements: Goal: Respiratory complications will improve Outcome: Progressing   Problem: Nutrition: Goal: Adequate nutrition will be maintained Outcome: Progressing   Problem: Elimination: Goal: Will not experience complications related to urinary retention Outcome: Progressing

## 2019-02-19 NOTE — Progress Notes (Signed)
PROGRESS NOTE  Shirley [name] EHU:[id number] DOB: 07/21/1940 DOA: 02/18/2019 PCP: Ardith Dark, PA-C  HPI/Recap of past 24 hours: Shirley [name] is a 78 y.o. female with medical history significant of PAT, neuropathy, fibromyalgia, GERD, chronic pain, asthma who presents for confusion, SOB and cough.  Shirley [name] was just recently admitted to the hospital for a fall with a laceration on her leg and a UTI.  During the course of her UTI she was very confused.  She was discharged yesterday with plan to complete oral Abx at home.  When I spoke with Shirley [name] she was very confused, trying to get out of bed, had moments of lucidity and understanding of her situation, but also at times felt she needed to leave.  She was oriented to everything but the year and was redirectable.    I spoke with [name], the patient's daughter who sent her in to the hospital with EMS.  Shirley [name] reports that she was glad to have her mom home yesterday, but feels that she was not back to baseline.  She was still confused and "talking out of her head" at times.  This is very out of the ordinary for Shirley [name] unless she is ill.  The continued antibiotics did not make it to the pharmacy, so she has not been on Abx for the last 36 hours.  She fell again today and developed a cough with gurgling and SOB.  She couldn't take a deep breath or form a deep cough.  She had no production of sputum.  Shirley [name] went to [facility] and got a pulse oximitry which showed 93% on room air, progressing to 83% on room air by the afternoon and EMS was called.  EMS provided her oxygen and brought her to the ED.  Other symptoms included decreased water intake, decreased PO intake, increased sleeping and increased coughing.  Of note, Shirley [name] also completed a course of antibiotics and lansoprazole 1 month ago for a "stomach infection."  Shirley [name] also helped review Shirley [name]'s medications and she is on many psychoactive medications which may lead to  confusion.   ED Course: In the ED, she was weaned to [facility] and then to [facility].  She is confused.  Found to have worsening renal function, elevated CRP.  PCT of 0.11, WBC of 12.2 with a neutrophil predominance.  Troponin of 35, BNP of 258.  CXR only showed some mild right sided atelectasis.  BUN was 33.    02/19/19: Seen and examined.  Confused, oriented to place and year.  Reports lower abdominal discomfort with palpation.  Denies nausea.  On Rocephin for Klebsiella UTI.  On IV fluid due to AKI likely prerenal secondary to dehydration.  Assessment/Plan: Active Problems:   PAT (paroxysmal atrial tachycardia) / MAT   Lower extremity edema   Chronic pain syndrome   Acute metabolic encephalopathy   Fibromyalgia   AKI (acute kidney injury) ([place])   Hypertension   Recurrent falls   CKD (chronic kidney disease), stage III   Chronic diastolic CHF (congestive heart failure) (HCC)   Asthma   Dehydration   Leg laceration  AKI (acute kidney injury) ([place]) CKD (chronic kidney disease), stage III Dehydration Klebsiella UTI - Patient with difficult time keeping up with fluid intake, she is also on torsemide for diastolic CHF and lower extremity edema  baseline creatinine appears to be 1.4 with GFR 33 Presented with creatinine of 2.2 with GFR of 20 AKI likely prerenal in the setting of dehydration  Currently on IV fluid normal saline at 100 cc/h.  Will decrease rate to 60 cc/h to avoid volume overload in the setting of chronic diastolic CHF. Monitor urine output Avoid nephrotoxins Daily BMPs  Klebsiella UTI Urine culture grew greater than 100,000 colonies of Klebsiella pneumonia, resistant to ampicillin, sensitive to Rocephin Continue Rocephin Leukocytosis improving and procalcitonin is negative  Mildly elevated troponin Likely elevated in the setting of worsening renal function Troponin S peaked at 35 and trended down. Denies chest pain at the time of this visit  Hyperglycemia Presented with  elevated blood sugar Obtain A1c Note on antidepressant medication at home  Anemia of chronic disease Hemoglobin trending down to 9.1 from 9.5, MCV 96 No sign of overt bleeding Obtain iron studies Continue to monitor H&H  Chronic diastolic CHF Euvolemic on exam Last 2D echo 03/18/16, LVEF 65 to 70% with grade 1 diastolic dysfunction Continue strict I's and O's and daily weight Diuretics on hold due to AKI, likely prerenal in the setting of dehydration. Continue to closely monitor volume status  Acute metabolic encephalopathy in the setting of UTI and acute illness Treat underlying conditions Reorient as needed CT head done on 02/14/2019 showed no acute intracranial findings.  Revealed findings consistent with age-related atrophy and chronic small vessel ischemia, prior left parietal lobe occipital lobe infarct.  Essential hypertension Continue home medications Blood pressure is at goal  Peripheral neuropathy Continue home medications    PAT (paroxysmal atrial tachycardia) / MAT - Continue home metoprolol - Telemetry - Trend Troponin    Chronic pain syndrome Fibromyalgia - Given age and renal function, confusion/encephalopathy could be due to medication effects - Continue flexeril  - Hold scheduled oxycodone - prn only for pain - Continue gabapentin at lower dose - Continue prozac - Decrease amitriptyline to 12.5mg    Ambulatory dysfunction/recurrent falls - PT evaluation Fall precautions    Leg laceration - Sutures placed 4 days ago, if still in house can likely come out in 2-3 days.    DVT prophylaxis: Lovenox subcu daily Code Status: DNR, confirmed with daughter  Family Communication:  None at bedside.  Disposition Plan:  Patient is currently not appropriate for discharge at this time due to AKI likely prerenal in the setting of poor oral intake and dehydration, requiring IV fluid hydration.  Also due to acute metabolic encephalopathy likely in the setting of  UTI and acute illness requiring IV antibiotics.  Patient will require at least 2 midnights for further evaluation and treatment of present condition.  Consults called: None    Objective: Vitals:   02/18/19 2330 02/19/19 0000 02/19/19 0057 02/19/19 0415  BP: (!) 109/59 (!) 100/54 123/67 91/71  Pulse: 84 83 77 78  Resp: 16 15 20 19   Temp:   98.7 F (37.1 C) 98 F (36.7 C)  TempSrc:   Oral Oral  SpO2: 97% 98% 100% 96%  Weight:   74.8 kg   Height:   5\' 4"  (1.626 m)     Intake/Output Summary (Last 24 hours) at 02/19/2019 0711 Last data filed at 02/19/2019 2355 Gross per 24 hour  Intake 671.71 ml  Output 210 ml  Net 461.71 ml   Filed Weights   02/18/19 1913 02/19/19 0057  Weight: 72.6 kg 74.8 kg    Exam:  . General: 78 y.o. year-old female well developed well nourished in no acute distress.  Alert and confused. . Cardiovascular: Regular rate and rhythm with no rubs or gallops.  No thyromegaly or JVD noted.  Marland Kitchen  Respiratory: Clear to auscultation with no wheezes or rales. Good inspiratory effort. . Abdomen: Soft nontender nondistended with normal bowel sounds x4 quadrants. . Musculoskeletal: Trace lower extremity edema. 2/4 pulses in all 4 extremities. Marland Kitchen Psychiatry: Mood is appropriate for condition and setting   Data Reviewed: CBC: Recent Labs  Lab 02/15/19 0434 02/16/19 0340 02/17/19 0423 02/18/19 1959 02/19/19 0255  WBC 8.3 6.9 7.9 12.2* 11.4*  NEUTROABS  --   --   --  9.8*  --   HGB 9.4* 9.6* 10.2* 9.5* 9.1*  HCT 30.3* 30.9* 33.0* 31.6* 30.1*  MCV 93.2 93.4 95.1 96.6 96.2  PLT 569* 579* 644* 547* 638*   Basic Metabolic Panel: Recent Labs  Lab 02/15/19 0434 02/16/19 0340 02/17/19 0423 02/18/19 1959 02/19/19 0255  NA 137 141 138 132* 135  K 4.1 3.8 4.5 5.0 4.6  CL 102 108 108 100 103  CO2 22 22 19* 21* 20*  GLUCOSE 124* 84 139* 164* 164*  BUN 39* 26* 27* 33* 32*  CREATININE 2.81* 1.58* 1.48* 2.13* 2.23*  CALCIUM 8.7* 8.7* 8.3* 9.0 9.1  MG  --  1.9   --   --   --    GFR: Estimated Creatinine Clearance: 19.9 mL/min (A) (by C-G formula based on SCr of 2.23 mg/dL (H)). Liver Function Tests: Recent Labs  Lab 02/14/19 1855 02/18/19 1959  AST 17 25  ALT 14 15  ALKPHOS 77 67  BILITOT 0.5 0.6  PROT 6.1* 6.3*  ALBUMIN 3.5 3.5   No results for input(s): LIPASE, AMYLASE in the last 168 hours. Recent Labs  Lab 02/15/19 1637  AMMONIA 21   Coagulation Profile: No results for input(s): INR, PROTIME in the last 168 hours. Cardiac Enzymes: No results for input(s): CKTOTAL, CKMB, CKMBINDEX, TROPONINI in the last 168 hours. BNP (last 3 results) No results for input(s): PROBNP in the last 8760 hours. HbA1C: No results for input(s): HGBA1C in the last 72 hours. CBG: Recent Labs  Lab 02/19/19 0625  GLUCAP 181*   Lipid Profile: Recent Labs    02/18/19 1956  TRIG 113   Thyroid Function Tests: Recent Labs    02/19/19 0255  TSH 2.515   Anemia Panel: Recent Labs    02/18/19 1959  FERRITIN 123   Urine analysis:    Component Value Date/Time   COLORURINE YELLOW 02/19/2019 0558   APPEARANCEUR CLEAR 02/19/2019 0558   LABSPEC 1.009 02/19/2019 0558   PHURINE 5.0 02/19/2019 0558   GLUCOSEU NEGATIVE 02/19/2019 0558   HGBUR NEGATIVE 02/19/2019 0558   BILIRUBINUR NEGATIVE 02/19/2019 0558   KETONESUR NEGATIVE 02/19/2019 0558   PROTEINUR NEGATIVE 02/19/2019 0558   NITRITE NEGATIVE 02/19/2019 0558   LEUKOCYTESUR NEGATIVE 02/19/2019 0558   Sepsis Labs: @LABRCNTIP (procalcitonin:4,lacticidven:4)  ) Recent Results (from the past 240 hour(s))  SARS CORONAVIRUS 2 (TAT 6-24 HRS) Nasopharyngeal Nasopharyngeal Swab     Status: None   Collection Time: 02/14/19  9:18 PM   Specimen: Nasopharyngeal Swab  Result Value Ref Range Status   SARS Coronavirus 2 NEGATIVE NEGATIVE Final    Comment: (NOTE) SARS-CoV-2 target nucleic acids are NOT DETECTED. The SARS-CoV-2 RNA is generally detectable in upper and lower respiratory specimens during  the acute phase of infection. Negative results do not preclude SARS-CoV-2 infection, do not rule out co-infections with other pathogens, and should not be used as the sole basis for treatment or other patient management decisions. Negative results must be combined with clinical observations, patient history, and epidemiological information. The expected result is  Negative. Fact Sheet for Patients: SugarRoll.be Fact Sheet for Healthcare Providers: https://www.woods-mathews.com/ This test is not yet approved or cleared by the [place] FDA and  has been authorized for detection and/or diagnosis of SARS-CoV-2 by FDA under an Emergency Use Authorization (EUA). This EUA will remain  in effect (meaning this test can be used) for the duration of the COVID-19 declaration under Section 56 4(b)(1) of the Act, 21 U.S.C. section 360bbb-3(b)(1), unless the authorization is terminated or revoked sooner. Performed at [facility], [place] [address], [place], [place] [address]   Culture, Urine     Status: Abnormal   Collection Time: 02/15/19 12:40 AM   Specimen: Urine, Random  Result Value Ref Range Status   Specimen Description URINE, RANDOM  Final   Special Requests   Final    NONE Performed at [facility], [place] [address], [place], [place] [address]    Culture >=100,000 COLONIES/mL KLEBSIELLA PNEUMONIAE (A)  Final   Report Status 02/17/2019 FINAL  Final   Organism ID, Bacteria KLEBSIELLA PNEUMONIAE (A)  Final      Susceptibility   Klebsiella pneumoniae - MIC*    AMPICILLIN >=32 RESISTANT Resistant     CEFAZOLIN <=4 SENSITIVE Sensitive     CEFTRIAXONE <=1 SENSITIVE Sensitive     CIPROFLOXACIN <=0.25 SENSITIVE Sensitive     GENTAMICIN <=1 SENSITIVE Sensitive     IMIPENEM <=0.25 SENSITIVE Sensitive     NITROFURANTOIN 64 INTERMEDIATE Intermediate     TRIMETH/SULFA <=20 SENSITIVE Sensitive     AMPICILLIN/SULBACTAM 4 SENSITIVE Sensitive      PIP/TAZO <=4 SENSITIVE Sensitive     * >=100,000 COLONIES/mL KLEBSIELLA PNEUMONIAE  MRSA PCR Screening     Status: None   Collection Time: 02/15/19  4:00 AM   Specimen: Nasal Mucosa; Nasopharyngeal  Result Value Ref Range Status   MRSA by PCR NEGATIVE NEGATIVE Final    Comment:        The GeneXpert MRSA Assay (FDA approved for NASAL specimens only), is one component of a comprehensive MRSA colonization surveillance program. It is not intended to diagnose MRSA infection nor to guide or monitor treatment for MRSA infections. Performed at [facility], [place] [address] [place], [place] [address]   Respiratory Panel by RT PCR (Flu A&B, Covid) - Nasopharyngeal Swab     Status: None   Collection Time: 02/18/19  9:21 PM   Specimen: Nasopharyngeal Swab  Result Value Ref Range Status   SARS Coronavirus 2 by RT PCR NEGATIVE NEGATIVE Final    Comment: (NOTE) SARS-CoV-2 target nucleic acids are NOT DETECTED. The SARS-CoV-2 RNA is generally detectable in upper respiratoy specimens during the acute phase of infection. The lowest concentration of SARS-CoV-2 viral copies this assay can detect is 131 copies/mL. A negative result does not preclude SARS-Cov-2 infection and should not be used as the sole basis for treatment or other patient management decisions. A negative result may occur with  improper specimen collection/handling, submission of specimen other than nasopharyngeal swab, presence of viral mutation(s) within the areas targeted by this assay, and inadequate number of viral copies (<131 copies/mL). A negative result must be combined with clinical observations, patient history, and epidemiological information. The expected result is Negative. Fact Sheet for Patients:  PinkCheek.be Fact Sheet for Healthcare Providers:  GravelBags.it This test is not yet ap proved or cleared by the [place] FDA and  has been  authorized for detection and/or diagnosis of SARS-CoV-2 by FDA under an Emergency Use  Authorization (EUA). This EUA will remain  in effect (meaning this test can be used) for the duration of the COVID-19 declaration under Section 564(b)(1) of the Act, 21 U.S.C. section 360bbb-3(b)(1), unless the authorization is terminated or revoked sooner.    Influenza A by PCR NEGATIVE NEGATIVE Final   Influenza B by PCR NEGATIVE NEGATIVE Final    Comment: (NOTE) The Xpert Xpress SARS-CoV-2/FLU/RSV assay is intended as an aid in  the diagnosis of influenza from Nasopharyngeal swab specimens and  should not be used as a sole basis for treatment. Nasal washings and  aspirates are unacceptable for Xpert Xpress SARS-CoV-2/FLU/RSV  testing. Fact Sheet for Patients: PinkCheek.be Fact Sheet for Healthcare Providers: GravelBags.it This test is not yet approved or cleared by the [place] FDA and  has been authorized for detection and/or diagnosis of SARS-CoV-2 by  FDA under an Emergency Use Authorization (EUA). This EUA will remain  in effect (meaning this test can be used) for the duration of the  Covid-19 declaration under Section 564(b)(1) of the Act, 21  U.S.C. section 360bbb-3(b)(1), unless the authorization is  terminated or revoked. Performed at [facility], [place] [address] [place], [place] [address]       Studies: DG Chest Portable 1 View  Result Date: 02/18/2019 CLINICAL DATA:  Shortness of breath. EXAM: PORTABLE CHEST 1 VIEW COMPARISON:  02/14/2019 and 09/28/2018 FINDINGS: Heart size and pulmonary vascularity are normal. The lungs are clear except for some minimal atelectasis at the right lung base, unchanged. No acute bone abnormality. IMPRESSION: No acute abnormalities. Minimal atelectasis at the right base, unchanged. Electronically Signed   By: Lorriane Shire M.D.   On: 02/18/2019 21:09    Scheduled Meds: .  amitriptyline  12.5 mg Oral QHS  . aspirin EC  81 mg Oral Daily  . cyclobenzaprine  10 mg Oral BID  . enoxaparin (LOVENOX) injection  30 mg Subcutaneous Q24H  . FLUoxetine  40 mg Oral Daily  . gabapentin  300 mg Oral BID  . metoprolol tartrate  25 mg Oral BID  . pantoprazole  40 mg Oral Daily  . rosuvastatin  5 mg Oral Daily  . sodium chloride flush  3 mL Intravenous Q12H    Continuous Infusions: . sodium chloride 100 mL/hr at 02/19/19 0626  . sodium chloride 10 mL/hr at 02/19/19 0626  . [START ON 02/20/2019] cefTRIAXone (ROCEPHIN)  IV       LOS: 1 day     Kayleen Memos, MD [facility] Pager [phone]  If 7PM-7AM, please contact night-coverage [email or web address] Password [email or web address] 02/19/2019, 7:11 AM

## 2019-02-19 NOTE — Evaluation (Addendum)
Physical Therapy Evaluation Patient Details Name: Shirley [name] MRN: [id number] DOB: 1940/04/08 Today's Date: 02/19/2019   History of Present Illness  Patient is a 78 y/o female who presents with confusion, SOB, cough and falls. Admitted with AKI and Klebsiella UTI. CXR- right atelectasis. Pt recently admitted 12/23 for left leg laceration due to fall and UTI. PMH includes chronic LBP, recurrent UTIs, recurrent falls.  Clinical Impression  Patient presents with generalized weakness, tremors, impaired balance, decreased activity tolerance and impaired mobility s/p above. Pt with hx of falls and recent admission on 12/23 s/p fall. Has caregivers each day until 9 pm per chart and needs assist with ADLs and IADLs. Lives with spouse who has dementia. Today, pt requires Max A for bed mobility and standing transfer with heavy posterior and right lateral lean. Limited endurance and fatigues quickly. Requires assist for static sitting balance. Pt weaker than previous admission and is already a fall risk. Would benefit from SNF to maximize independence and mobility prior to return home. If family declines, needs 24/7 supervision/assist for all mobility if able to care for pt at this level. Will follow.    Follow Up Recommendations SNF;Supervision for mobility/OOB;Supervision/Assistance - 24 hour    Equipment Recommendations  None recommended by PT    Recommendations for Other Services       Precautions / Restrictions Precautions Precautions: Fall Precaution Comments: multiple falls at home Restrictions Weight Bearing Restrictions: No      Mobility  Bed Mobility Overal bed mobility: Needs Assistance Bed Mobility: Supine to Sit;Sit to Supine;Rolling Rolling: Min assist   Supine to sit: Max assist;HOB elevated Sit to supine: Mod assist;HOB elevated   General bed mobility comments: Assist to bring LES to EOB and to elevate trunk. Assist to bring LEs into bed to return to supine. Posterior lean  and to the right.  Transfers Overall transfer level: Needs assistance Equipment used: Rolling walker (2 wheeled) Transfers: Sit to/from Stand Sit to Stand: Mod assist;From elevated surface         General transfer comment: Heavvy Mod A to stand from elevated bed height with right lateral lean and narrow boS.  Ambulation/Gait             General Gait Details: Unable at this time.  Stairs            Wheelchair Mobility    Modified Rankin (Stroke Patients Only)       Balance Overall balance assessment: Needs assistance Sitting-balance support: Feet supported;Bilateral upper extremity supported Sitting balance-Leahy Scale: Poor Sitting balance - Comments: Requires UE support for static sitting balance and Min-MOd A due to posterior lean and tremoring. Postural control: Posterior lean;Right lateral lean Standing balance support: During functional activity Standing balance-Leahy Scale: Poor Standing balance comment: requires Mod A and UE support on RW.                             Pertinent Vitals/Pain Pain Assessment: Faces Faces Pain Scale: Hurts little more Pain Location: right ankle Pain Descriptors / Indicators: Tender;Sore Pain Intervention(s): Repositioned;Monitored during session    Home Living Family/patient expects to be discharged to:: Private residence Living Arrangements: Children Available Help at Discharge: Personal care attendant;Available PRN/intermittently;Family Type of Home: House Home Access: Stairs to enter Entrance Stairs-Rails: None Entrance Stairs-Number of Steps: 1+1 Home Layout: One level Home Equipment: Environmental consultant - 4 wheels;Shower seat;Bedside commode;Grab bars - tub/shower Additional Comments: Pt has daytime caregivers until 9 p  Prior Function Level of Independence: Needs assistance   Gait / Transfers Assistance Needed: uses rollator   ADL's / Homemaking Assistance Needed: caregivers assist with bathing, dressing  and iADLS        Hand Dominance   Dominant Hand: Right    Extremity/Trunk Assessment   Upper Extremity Assessment Upper Extremity Assessment: Defer to OT evaluation LUE Deficits / Details: L shoulder ROM deficit from prior sx    Lower Extremity Assessment Lower Extremity Assessment: Generalized weakness;RLE deficits/detail RLE Deficits / Details: decreased knee ROM, TKA RLE Coordination: decreased fine motor;decreased gross motor LLE Deficits / Details: decreased knee ROM, TKA LLE Coordination: decreased fine motor    Cervical / Trunk Assessment Cervical / Trunk Assessment: Kyphotic  Communication   Communication: No difficulties  Cognition Arousal/Alertness: Awake/alert Behavior During Therapy: WFL for tasks assessed/performed Overall Cognitive Status: Impaired/Different from baseline Area of Impairment: Memory;Problem solving                     Memory: Decreased short-term memory       Problem Solving: Slow processing;Requires verbal cues;Requires tactile cues General Comments: Confusion seems to be slowly improving. Slow processing noted.      General Comments General comments (skin integrity, edema, etc.): Sutures clean dry and intact.    Exercises     Assessment/Plan    PT Assessment Patient needs continued PT services  PT Problem List Decreased strength;Decreased activity tolerance;Decreased balance;Decreased mobility;Decreased cognition;Decreased safety awareness;Decreased coordination;Cardiopulmonary status limiting activity;Decreased skin integrity;Pain       PT Treatment Interventions DME instruction;Gait training;Stair training;Functional mobility training;Therapeutic activities;Therapeutic exercise;Balance training;Cognitive remediation;Patient/family education;Wheelchair mobility training    PT Goals (Current goals can be found in the Care Plan section)  Acute Rehab PT Goals Patient Stated Goal: feel better PT Goal Formulation: With  patient Time For Goal Achievement: 03/19/2019 Potential to Achieve Goals: Good    Frequency Min 3X/week   Barriers to discharge Decreased caregiver support caregiver until 9pm    Co-evaluation               AM-PAC PT "6 Clicks" Mobility  Outcome Measure Help needed turning from your back to your side while in a flat bed without using bedrails?: A Little Help needed moving from lying on your back to sitting on the side of a flat bed without using bedrails?: Total Help needed moving to and from a bed to a chair (including a wheelchair)?: Total Help needed standing up from a chair using your arms (e.g., wheelchair or bedside chair)?: Total Help needed to walk in hospital room?: A Lot Help needed climbing 3-5 steps with a railing? : Total 6 Click Score: 9    End of Session Equipment Utilized During Treatment: Gait belt Activity Tolerance: Patient limited by fatigue Patient left: in bed;with call bell/phone within reach;with nursing/sitter in room Nurse Communication: Mobility status PT Visit Diagnosis: Unsteadiness on feet (R26.81);Other abnormalities of gait and mobility (R26.89);Muscle weakness (generalized) (M62.81);Repeated falls (R29.6);Difficulty in walking, not elsewhere classified (R26.2)    Time: 7517-0017 PT Time Calculation (min) (ACUTE ONLY): 18 min   Charges:   PT Evaluation $PT Eval Moderate Complexity: 1 Mod          Marisa Severin, PT, DPT Acute Rehabilitation Services Pager [phone] Office [place] 02/19/2019, 10:41 AM

## 2019-02-19 NOTE — Progress Notes (Signed)
Patient arrived to unit.  Cardiac monitoring initiated and verified (NSR). Patient has no complaints and is resting in bed. Will continue to monitor.

## 2019-02-19 NOTE — Progress Notes (Signed)
Spoke with pt's daughter Isra Lindy) regarding plan of care. Questions and concerns answered.

## 2019-02-20 ENCOUNTER — Encounter (HOSPITAL_COMMUNITY): Payer: Self-pay | Admitting: Internal Medicine

## 2019-02-20 LAB — BASIC METABOLIC PANEL
Anion gap: 10 (ref 5–15)
BUN: 32 mg/dL — ABNORMAL HIGH (ref 8–23)
CO2: 20 mmol/L — ABNORMAL LOW (ref 22–32)
Calcium: 8.7 mg/dL — ABNORMAL LOW (ref 8.9–10.3)
Chloride: 109 mmol/L (ref 98–111)
Creatinine, Ser: 1.47 mg/dL — ABNORMAL HIGH (ref 0.44–1.00)
GFR calc Af Amer: 39 mL/min — ABNORMAL LOW (ref >60)
GFR calc non Af Amer: 33 mL/min — ABNORMAL LOW (ref >60)
Glucose, Bld: 109 mg/dL — ABNORMAL HIGH (ref 70–99)
Potassium: 4.3 mmol/L (ref 3.5–5.1)
Sodium: 139 mmol/L (ref 135–145)

## 2019-02-20 LAB — URINE CULTURE: Culture: NO GROWTH

## 2019-02-20 LAB — FOLATE RBC
Folate, Hemolysate: 247 ng/mL
Folate, RBC: 908 ng/mL (ref >498)
Hematocrit: 27.2 % — ABNORMAL LOW (ref 34.0–46.6)

## 2019-02-20 LAB — GLUCOSE, CAPILLARY: Glucose-Capillary: 106 mg/dL — ABNORMAL HIGH (ref 70–99)

## 2019-02-20 MED ORDER — SODIUM CHLORIDE 3 % IN NEBU
4.0000 mL | INHALATION_SOLUTION | Freq: Every day | RESPIRATORY_TRACT | Status: DC
  Filled 2019-02-20: qty 4

## 2019-02-20 MED ORDER — ENOXAPARIN SODIUM 40 MG/0.4ML ~~LOC~~ SOLN
40.0000 mg | SUBCUTANEOUS | Status: DC
  Administered 2019-02-21: 40 mg via SUBCUTANEOUS
  Filled 2019-02-20: qty 0.4

## 2019-02-20 MED ORDER — HYDROCOD POLST-CPM POLST ER 10-8 MG/5ML PO SUER: 5.0000 mL | Freq: Two times a day (BID) | ORAL | Status: DC

## 2019-02-20 MED ORDER — GUAIFENESIN-DM 100-10 MG/5ML PO SYRP: 10.0000 mL | ORAL_SOLUTION | Freq: Three times a day (TID) | ORAL | Status: DC

## 2019-02-20 MED ORDER — GUAIFENESIN-DM 100-10 MG/5ML PO SYRP
10.0000 mL | ORAL_SOLUTION | Freq: Three times a day (TID) | ORAL | Status: DC
  Administered 2019-02-20 – 2019-02-22 (×5): 10 mL via ORAL
  Filled 2019-02-20 (×6): qty 10

## 2019-02-20 MED ORDER — MUPIROCIN CALCIUM 2 % EX CREA
TOPICAL_CREAM | Freq: Every day | CUTANEOUS | Status: DC
  Filled 2019-02-20: qty 15

## 2019-02-20 MED ORDER — AZITHROMYCIN 250 MG PO TABS
250.0000 mg | ORAL_TABLET | Freq: Every day | ORAL | Status: DC
  Administered 2019-02-21 – 2019-02-22 (×2): 250 mg via ORAL
  Filled 2019-02-20 (×2): qty 1

## 2019-02-20 MED ORDER — HYDROCOD POLST-CPM POLST ER 10-8 MG/5ML PO SUER
5.0000 mL | Freq: Two times a day (BID) | ORAL | Status: DC
  Administered 2019-02-20 – 2019-02-22 (×4): 5 mL via ORAL
  Filled 2019-02-20 (×4): qty 5

## 2019-02-20 MED ORDER — IPRATROPIUM-ALBUTEROL 0.5-2.5 (3) MG/3ML IN SOLN
3.0000 mL | Freq: Two times a day (BID) | RESPIRATORY_TRACT | Status: DC
  Administered 2019-02-20 – 2019-02-21 (×3): 3 mL via RESPIRATORY_TRACT
  Filled 2019-02-20 (×4): qty 3

## 2019-02-20 MED ORDER — FERROUS SULFATE 325 (65 FE) MG PO TABS
325.0000 mg | ORAL_TABLET | Freq: Every day | ORAL | Status: DC
  Administered 2019-02-21 – 2019-02-22 (×2): 325 mg via ORAL
  Filled 2019-02-20 (×2): qty 1

## 2019-02-20 MED ORDER — DM-GUAIFENESIN ER 30-600 MG PO TB12
2.0000 | ORAL_TABLET | Freq: Two times a day (BID) | ORAL | Status: DC
  Administered 2019-02-20: 2 via ORAL
  Filled 2019-02-20: qty 2

## 2019-02-20 MED ORDER — AZITHROMYCIN 500 MG PO TABS
500.0000 mg | ORAL_TABLET | Freq: Every day | ORAL | Status: AC
  Administered 2019-02-20: 500 mg via ORAL
  Filled 2019-02-20: qty 1

## 2019-02-20 MED ORDER — SODIUM CHLORIDE 3 % IN NEBU: 4.0000 mL | INHALATION_SOLUTION | Freq: Every day | RESPIRATORY_TRACT | Status: DC

## 2019-02-20 NOTE — Progress Notes (Signed)
Patient unable to ambulate. Oxygen saturation checked for 15 min without oxygen nasal canula.  Patient Saturations on Room Air at Rest 15 minutes after oxygen removal pt dropped to  85%   Patient Saturations on 2 Liters of oxygen while sitting= 98%  Please briefly explain why patient needs home oxygen: patient oxygen saturation dropped to 85% at room air after removing nasal canula. Pt quickly recovered when placed on 2L nasal canula to 98% oxygen saturation.

## 2019-02-20 NOTE — TOC Progression Note (Signed)
Transition of Care [facility]) - Progression Note    Patient Details  Name: TEZRA MAHR MRN: [id number] Date of Birth: 06/08/1940  Transition of Care [facility]) CM/SW [place], [place] Phone Number: [phone] 02/20/2019, 2:06 PM  Clinical Narrative:     CSW spoke with patient's daughter [name] to provide bed offers, she reports her brother is going to look at [facility] as they are leaning towards this choice. Juliann Pulse with [facility] To call [name] today to answer any facility related questions including visitation and quarantine process.   Expected Discharge Plan: (TBD) Barriers to Discharge: Continued Medical Work up  Expected Discharge Plan and Services Expected Discharge Plan: (TBD)       Living arrangements for the past 2 months: Single Family Home                                       Social Determinants of Health (SDOH) Interventions    Readmission Risk Interventions No flowsheet data found.

## 2019-02-20 NOTE — Progress Notes (Signed)
PROGRESS NOTE  Shirley [name]:[id number] DOB: 1940/06/02 DOA: 02/18/2019 PCP: Ardith Dark, PA-C  HPI/Recap of past 24 hours: Shirley [name] is a 78 y.o. female with medical history significant of PAT, neuropathy, fibromyalgia, GERD, chronic pain, asthma who presents for confusion, SOB and cough.  Shirley [name] was just recently admitted to the hospital for a fall with a laceration on her leg and a UTI.  During the course of her UTI she was very confused.  She was discharged yesterday with plan to complete oral Abx at home.  When I spoke with Shirley [name] she was very confused, trying to get out of bed, had moments of lucidity and understanding of her situation, but also at times felt she needed to leave.  She was oriented to everything but the year and was redirectable.    I spoke with Shirley [name], the patient's daughter who sent her in to the hospital with EMS.  Shirley [name] reports that she was glad to have her mom home yesterday, but feels that she was not back to baseline.  She was still confused and "talking out of her head" at times.  This is very out of the ordinary for Shirley [name] unless she is ill.  The continued antibiotics did not make it to the pharmacy, so she has not been on Abx for the last 36 hours.  She fell again today and developed a cough with gurgling and SOB.  She couldn't take a deep breath or form a deep cough.  She had no production of sputum.  Shirley [name] went to [facility] and got a pulse oximitry which showed 93% on room air, progressing to 83% on room air by the afternoon and EMS was called.  EMS provided her oxygen and brought her to the ED.  Other symptoms included decreased water intake, decreased PO intake, increased sleeping and increased coughing.  Of note, Shirley [name] also completed a course of antibiotics and lansoprazole 1 month ago for a "stomach infection."  Shirley [name] also helped review Shirley [name]'s medications and she is on many psychoactive medications which may lead to  confusion.   ED Course: In the ED, she was weaned to [facility] and then to RA.  She is confused.  Found to have worsening renal function, elevated CRP.  PCT of 0.11, WBC of 12.2 with a neutrophil predominance.  Troponin of 35, BNP of 258.  CXR only showed some mild right sided atelectasis.  BUN was 33.    02/20/19: Seen and examined.  More alert today.  Semiproductive cough noted on exam.  Denies chest pain.  Added pulmonary toilet.   Assessment/Plan: Active Problems:   PAT (paroxysmal atrial tachycardia) / MAT   Lower extremity edema   Chronic pain syndrome   Acute metabolic encephalopathy   Fibromyalgia   AKI (acute kidney injury) ([place])   Hypertension   Recurrent falls   CKD (chronic kidney disease), stage III   Chronic diastolic CHF (congestive heart failure) (HCC)   Asthma   Dehydration   Leg laceration  AKI on CKD 3B likely prerenal in the setting of dehydration from poor oral intake.   - Patient with difficult time keeping up with fluid intake, she is also on torsemide for diastolic CHF and lower extremity edema. Baseline creatinine 1.4 with GFR 30. Back to her baseline creatinine Continue to avoid nephrotoxins Continue to monitor urine output Continue daily BMPs  Klebsiella UTI Urine culture from 02/15/2019 grew greater than 100,000 colonies of Klebsiella pneumonia, resistant to  ampicillin, sensitive to Rocephin Repeat urine culture on 02/19/2019 no growth final. Leukocytosis improved. Repeat CBC in the morning with procalcitonin if negative DC antibiotics  Semiproductive cough, suspect secondary to mild pulmonary edema versus acute bronchitis Negative procalcitonin Start pulmonary toilet Start Z-pack for its anti-inflammatory properties. Holding off diuretics due to recent AKI requiring IV fluid hydration O2 saturation 99% on 2 L  Right ankle chronic wound, POA Local wound dressing per wound care specialist recommendations.  Mildly elevated troponin Likely elevated  in the setting of worsening renal function Troponin S peaked at 35 and trended down. Denies chest pain at the time of this visit  Hyperglycemia Presented with elevated blood sugar Hemoglobin A1c 6.4 on 02/19/2019. Not on diabetic medications at home.  Anemia of chronic disease Hemoglobin trending down to 9.1 from 9.5, MCV 96 No sign of overt bleeding Iron studies suggestive of iron deficiency. Start ferrous sulfate 325 mg daily. Continue to monitor H&H  Chronic diastolic CHF Euvolemic on exam Last 2D echo 03/18/16, LVEF 65 to 70% with grade 1 diastolic dysfunction Continue strict I's and O's and daily weight Diuretics on hold due to AKI, likely prerenal in the setting of dehydration. Continue to closely monitor volume status  Acute metabolic encephalopathy in the setting of UTI and acute illness Treat underlying conditions Reorient as needed CT head done on 02/14/2019 showed no acute intracranial findings.  Revealed findings consistent with age-related atrophy and chronic small vessel ischemia, prior left parietal lobe occipital lobe infarct. Fall precautions in place.  Essential hypertension Blood pressure is at goal Continue home medications  Peripheral neuropathy Continue home medications    PAT (paroxysmal atrial tachycardia) / MAT - Continue home metoprolol -Continue to monitor on telemetry    Chronic pain syndrome Fibromyalgia - Given age and renal function, confusion/encephalopathy could be due to medication effects - Continue flexeril  - Hold scheduled oxycodone - prn only for pain - Continue gabapentin at lower dose - Continue prozac - Decrease amitriptyline to 12.5mg    Ambulatory dysfunction/recurrent falls -PT assessed and recommended SNF CSW following and assisting with placement. Continue PT OT with assistance and fall precautions.      Leg laceration - Sutures placed 4 days ago, if still in house can likely come out in 2-3 days.  -No acute  issues.   DVT prophylaxis: Lovenox subcu daily Code Status: DNR, confirmed with daughter  Family Communication:  Updated her daughter via phone on 02/19/2019.  Disposition Plan:  Plan to discharge in the next 24 to 48 hours once bed is obtained and patient is hemodynamically stable.  Consults called: None    Objective: Vitals:   02/19/19 2125 02/20/19 0002 02/20/19 0422 02/20/19 0824  BP: 140/76 104/77 118/67 140/73  Pulse: 76 71 67 71  Resp: 17  15 18   Temp: 98 F (36.7 C) 97.7 F (36.5 C) 97.6 F (36.4 C) 97.7 F (36.5 C)  TempSrc: Oral Oral Oral Oral  SpO2: 96% 98% 98% 99%  Weight:      Height:        Intake/Output Summary (Last 24 hours) at 02/20/2019 0910 Last data filed at 02/20/2019 0540 Gross per 24 hour  Intake 960 ml  Output 1150 ml  Net -190 ml   Filed Weights   02/18/19 1913 02/19/19 0057  Weight: 72.6 kg 74.8 kg    Exam:  . General: 78 y.o. year-old female well-developed well-nourished in no acute distress.  Alert and interactive. . Cardiovascular: Regular rate and rhythm no rubs  or gallops. Marland Kitchen Respiratory: Mild rales at bases no wheezing noted.  Poor inspiratory effort. . Abdomen: Soft nontender normal bowel sounds present. . Musculoskeletal: Trace lower extremity edema bilaterally. Psychiatry: Mood is appropriate for condition and setting.  Data Reviewed: CBC: Recent Labs  Lab 02/15/19 0434 02/16/19 0340 02/17/19 0423 02/18/19 1959 02/19/19 0255  WBC 8.3 6.9 7.9 12.2* 11.4*  NEUTROABS  --   --   --  9.8*  --   HGB 9.4* 9.6* 10.2* 9.5* 9.1*  HCT 30.3* 30.9* 33.0* 31.6* 30.1*  MCV 93.2 93.4 95.1 96.6 96.2  PLT 569* 579* 644* 547* 631*   Basic Metabolic Panel: Recent Labs  Lab 02/16/19 0340 02/17/19 0423 02/18/19 1959 02/19/19 0255 02/20/19 0430  NA 141 138 132* 135 139  K 3.8 4.5 5.0 4.6 4.3  CL 108 108 100 103 109  CO2 22 19* 21* 20* 20*  GLUCOSE 84 139* 164* 164* 109*  BUN 26* 27* 33* 32* 32*  CREATININE 1.58* 1.48*  2.13* 2.23* 1.47*  CALCIUM 8.7* 8.3* 9.0 9.1 8.7*  MG 1.9  --   --   --   --    GFR: Estimated Creatinine Clearance: 30.2 mL/min (A) (by C-G formula based on SCr of 1.47 mg/dL (H)). Liver Function Tests: Recent Labs  Lab 02/14/19 1855 02/18/19 1959  AST 17 25  ALT 14 15  ALKPHOS 77 67  BILITOT 0.5 0.6  PROT 6.1* 6.3*  ALBUMIN 3.5 3.5   No results for input(s): LIPASE, AMYLASE in the last 168 hours. Recent Labs  Lab 02/15/19 1637  AMMONIA 21   Coagulation Profile: No results for input(s): INR, PROTIME in the last 168 hours. Cardiac Enzymes: No results for input(s): CKTOTAL, CKMB, CKMBINDEX, TROPONINI in the last 168 hours. BNP (last 3 results) No results for input(s): PROBNP in the last 8760 hours. HbA1C: Recent Labs    02/19/19 0406  HGBA1C 6.4*   CBG: Recent Labs  Lab 02/19/19 0625 02/20/19 0615  GLUCAP 181* 106*   Lipid Profile: Recent Labs    02/18/19 1956  TRIG 113   Thyroid Function Tests: Recent Labs    02/19/19 0255  TSH 2.515   Anemia Panel: Recent Labs    02/18/19 1959 02/19/19 0805  VITAMINB12  --  945*  FERRITIN 123 109  TIBC  --  258  IRON  --  23*   Urine analysis:    Component Value Date/Time   COLORURINE YELLOW 02/19/2019 [place] 02/19/2019 0558   LABSPEC 1.009 02/19/2019 0558   PHURINE 5.0 02/19/2019 0558   GLUCOSEU NEGATIVE 02/19/2019 0558   HGBUR NEGATIVE 02/19/2019 0558   BILIRUBINUR NEGATIVE 02/19/2019 0558   KETONESUR NEGATIVE 02/19/2019 0558   PROTEINUR NEGATIVE 02/19/2019 0558   NITRITE NEGATIVE 02/19/2019 0558   LEUKOCYTESUR NEGATIVE 02/19/2019 0558   Sepsis Labs: @LABRCNTIP (procalcitonin:4,lacticidven:4)  ) Recent Results (from the past 240 hour(s))  SARS CORONAVIRUS 2 (TAT 6-24 HRS) Nasopharyngeal Nasopharyngeal Swab     Status: None   Collection Time: 02/14/19  9:18 PM   Specimen: Nasopharyngeal Swab  Result Value Ref Range Status   SARS Coronavirus 2 NEGATIVE NEGATIVE Final     Comment: (NOTE) SARS-CoV-2 target nucleic acids are NOT DETECTED. The SARS-CoV-2 RNA is generally detectable in upper and lower respiratory specimens during the acute phase of infection. Negative results do not preclude SARS-CoV-2 infection, do not rule out co-infections with other pathogens, and should not be used as the sole basis for treatment or other patient  management decisions. Negative results must be combined with clinical observations, patient history, and epidemiological information. The expected result is Negative. Fact Sheet for Patients: SugarRoll.be Fact Sheet for Healthcare Providers: https://www.woods-mathews.com/ This test is not yet approved or cleared by the [place] FDA and  has been authorized for detection and/or diagnosis of SARS-CoV-2 by FDA under an Emergency Use Authorization (EUA). This EUA will remain  in effect (meaning this test can be used) for the duration of the COVID-19 declaration under Section 56 4(b)(1) of the Act, 21 U.S.C. section 360bbb-3(b)(1), unless the authorization is terminated or revoked sooner. Performed at [facility], [place] [address] [place], [place] [address]   Culture, Urine     Status: Abnormal   Collection Time: 02/15/19 12:40 AM   Specimen: Urine, Random  Result Value Ref Range Status   Specimen Description URINE, RANDOM  Final   Special Requests   Final    NONE Performed at [facility], [place] [address], [place], [place] [address]    Culture >=100,000 COLONIES/mL KLEBSIELLA PNEUMONIAE (A)  Final   Report Status 02/17/2019 FINAL  Final   Organism ID, Bacteria KLEBSIELLA PNEUMONIAE (A)  Final      Susceptibility   Klebsiella pneumoniae - MIC*    AMPICILLIN >=32 RESISTANT Resistant     CEFAZOLIN <=4 SENSITIVE Sensitive     CEFTRIAXONE <=1 SENSITIVE Sensitive     CIPROFLOXACIN <=0.25 SENSITIVE Sensitive     GENTAMICIN <=1 SENSITIVE Sensitive     IMIPENEM <=0.25  SENSITIVE Sensitive     NITROFURANTOIN 64 INTERMEDIATE Intermediate     TRIMETH/SULFA <=20 SENSITIVE Sensitive     AMPICILLIN/SULBACTAM 4 SENSITIVE Sensitive     PIP/TAZO <=4 SENSITIVE Sensitive     * >=100,000 COLONIES/mL KLEBSIELLA PNEUMONIAE  MRSA PCR Screening     Status: None   Collection Time: 02/15/19  4:00 AM   Specimen: Nasal Mucosa; Nasopharyngeal  Result Value Ref Range Status   MRSA by PCR NEGATIVE NEGATIVE Final    Comment:        The GeneXpert MRSA Assay (FDA approved for NASAL specimens only), is one component of a comprehensive MRSA colonization surveillance program. It is not intended to diagnose MRSA infection nor to guide or monitor treatment for MRSA infections. Performed at [facility], [place] [address], [place], [place] [address]   Blood Culture (routine x 2)     Status: None (Preliminary result)   Collection Time: 02/18/19  7:56 PM   Specimen: BLOOD RIGHT HAND  Result Value Ref Range Status   Specimen Description BLOOD RIGHT HAND  Final   Special Requests   Final    BOTTLES DRAWN AEROBIC AND ANAEROBIC Blood Culture results may not be optimal due to an inadequate volume of blood received in culture bottles   Culture   Final    NO GROWTH < 24 HOURS Performed at [facility], [place] [address] [place], [place] [address]    Report Status PENDING  Incomplete  Blood Culture (routine x 2)     Status: None (Preliminary result)   Collection Time: 02/18/19  7:56 PM   Specimen: BLOOD LEFT FOREARM  Result Value Ref Range Status   Specimen Description BLOOD LEFT FOREARM  Final   Special Requests   Final    BOTTLES DRAWN AEROBIC AND ANAEROBIC Blood Culture adequate volume   Culture   Final    NO GROWTH < 24 HOURS Performed at [facility], [place]  [address] [place], [place] [address]    Report Status PENDING  Incomplete  Respiratory Panel by RT PCR (Flu A&B, Covid) - Nasopharyngeal Swab     Status: None   Collection Time: 02/18/19  9:21 PM    Specimen: Nasopharyngeal Swab  Result Value Ref Range Status   SARS Coronavirus 2 by RT PCR NEGATIVE NEGATIVE Final    Comment: (NOTE) SARS-CoV-2 target nucleic acids are NOT DETECTED. The SARS-CoV-2 RNA is generally detectable in upper respiratoy specimens during the acute phase of infection. The lowest concentration of SARS-CoV-2 viral copies this assay can detect is 131 copies/mL. A negative result does not preclude SARS-Cov-2 infection and should not be used as the sole basis for treatment or other patient management decisions. A negative result may occur with  improper specimen collection/handling, submission of specimen other than nasopharyngeal swab, presence of viral mutation(s) within the areas targeted by this assay, and inadequate number of viral copies (<131 copies/mL). A negative result must be combined with clinical observations, patient history, and epidemiological information. The expected result is Negative. Fact Sheet for Patients:  PinkCheek.be Fact Sheet for Healthcare Providers:  GravelBags.it This test is not yet ap proved or cleared by the [place] FDA and  has been authorized for detection and/or diagnosis of SARS-CoV-2 by FDA under an Emergency Use Authorization (EUA). This EUA will remain  in effect (meaning this test can be used) for the duration of the COVID-19 declaration under Section 564(b)(1) of the Act, 21 U.S.C. section 360bbb-3(b)(1), unless the authorization is terminated or revoked sooner.    Influenza A by PCR NEGATIVE NEGATIVE Final   Influenza B by PCR NEGATIVE NEGATIVE Final    Comment: (NOTE) The Xpert Xpress SARS-CoV-2/FLU/RSV assay is intended as an aid in  the diagnosis of influenza from Nasopharyngeal swab specimens and  should not be used as a sole basis for treatment. Nasal washings and  aspirates are unacceptable for Xpert Xpress SARS-CoV-2/FLU/RSV  testing. Fact Sheet  for Patients: PinkCheek.be Fact Sheet for Healthcare Providers: GravelBags.it This test is not yet approved or cleared by the [place] FDA and  has been authorized for detection and/or diagnosis of SARS-CoV-2 by  FDA under an Emergency Use Authorization (EUA). This EUA will remain  in effect (meaning this test can be used) for the duration of the  Covid-19 declaration under Section 564(b)(1) of the Act, 21  U.S.C. section 360bbb-3(b)(1), unless the authorization is  terminated or revoked. Performed at [facility], [place] [address] [place], [place] [address]   Culture, Urine     Status: None   Collection Time: 02/19/19  5:49 AM   Specimen: Urine, Catheterized  Result Value Ref Range Status   Specimen Description URINE, CATHETERIZED  Final   Special Requests NONE  Final   Culture   Final    NO GROWTH Performed at [facility], [address], [place],  [address]    Report Status 02/20/2019 FINAL  Final      Studies: No results found.  Scheduled Meds: . amitriptyline  12.5 mg Oral QHS  . aspirin EC  81 mg Oral Daily  . cyclobenzaprine  10 mg Oral BID  . enoxaparin (LOVENOX) injection  30 mg Subcutaneous Q24H  . FLUoxetine  40 mg Oral Daily  . gabapentin  300 mg Oral BID  . metoprolol tartrate  25 mg Oral BID  . mupirocin cream   Topical Daily  . pantoprazole  40 mg Oral Daily  .  rosuvastatin  5 mg Oral Daily  . sodium chloride flush  3 mL Intravenous Q12H    Continuous Infusions: . sodium chloride 10 mL/hr at 02/19/19 0626  . cefTRIAXone (ROCEPHIN)  IV 1 g (02/20/19 0024)     LOS: 2 days     Kayleen Memos, MD [facility] Pager [phone]  If 7PM-7AM, please contact night-coverage [email or web address] Password [email or web address] 02/20/2019, 9:10 AM

## 2019-02-20 NOTE — Evaluation (Signed)
Occupational Therapy Evaluation Patient Details Name: Shirley [name] MRN: [id number] DOB: 03/26/1940 Today's Date: 02/20/2019    History of Present Illness Patient is a 78 y/o female who presents with confusion, SOB, cough and falls. Admitted with AKI and Klebsiella UTI. CXR- right atelectasis. Pt recently admitted 12/23 for left leg laceration due to fall and UTI. PMH includes chronic LBP, recurrent UTIs, recurrent falls.   Clinical Impression   This 78 y/o female presents with the above. PTA pt reports living at home, has daily assist from an aide (8am-8pm) for ADL/iADL tasks, reports using rollator for mobility but with increased number of falls recently. Pt presents supine in bed pleasant and willing to participate in therapy session. Pt requiring overall min-modA for bed mobility and functional transfers using RW, tolerating sit<>stand and taking few side steps along EOB with minA, pt requesting return to sitting due to fatigue. She currently requires minA for seated UB ADL, maxA for LB ADL. Pt on 2L O2 during session with VSS. She will benefit from continued acute OT services, currently recommend SNF level therapies at time of discharge however if family able to provide necessary 24hr supervision/assist pt may be able to transition home with [facility] services. Pt will require hands on 24hr assist given recent falls and currently at a higher risk for falls if she is to go home. Will follow.     Follow Up Recommendations  Supervision/Assistance - 24 hour;SNF    Equipment Recommendations  Wheelchair (measurements OT);Wheelchair cushion (measurements OT)(pending progress)           Precautions / Restrictions Precautions Precautions: Fall Precaution Comments: multiple falls at home Restrictions Weight Bearing Restrictions: No      Mobility Bed Mobility Overal bed mobility: Needs Assistance Bed Mobility: Supine to Sit;Sit to Supine     Supine to sit: Min assist Sit to supine: Mod  assist   General bed mobility comments: assist for trunk elevation   Transfers Overall transfer level: Needs assistance Equipment used: Rolling walker (2 wheeled) Transfers: Sit to/from Stand Sit to Stand: Min assist         General transfer comment: boosting and steadying assist at [place], pt able to take few side steps along EOB with minA throughout, fatigues easily and requested return to sitting    Balance Overall balance assessment: Needs assistance Sitting-balance support: Feet supported Sitting balance-Leahy Scale: Fair     Standing balance support: Bilateral upper extremity supported Standing balance-Leahy Scale: Poor                             ADL either performed or assessed with clinical judgement   ADL Overall ADL's : Needs assistance/impaired Eating/Feeding: Sitting;Set up   Grooming: Set up;Minimal assistance;Sitting;Wash/dry face;Oral care   Upper Body Bathing: Min guard;Minimal assistance;Sitting   Lower Body Bathing: Maximal assistance;Sit to/from stand   Upper Body Dressing : Minimal assistance;Sitting   Lower Body Dressing: Maximal assistance;Sit to/from stand Lower Body Dressing Details (indicate cue type and reason): minA to stand to RW     Toileting- Clothing Manipulation and Hygiene: Maximal assistance;Sit to/from stand       Functional mobility during ADLs: Minimal assistance;Rolling walker General ADL Comments: pt tolerated sitting EOB for brief period but reports increased pain in buttocks region and very restless, able to stand and take few side steps towards [facility] with minA and RW, fatigues easily     Vision  Perception     Praxis      Pertinent Vitals/Pain Pain Assessment: Faces Faces Pain Scale: Hurts little more Pain Location: generalized, buttocks Pain Descriptors / Indicators: Discomfort;Grimacing;Guarding(restless/repositioning herself seated EOB) Pain Intervention(s): Limited activity within patient's  tolerance;Monitored during session;Repositioned     Hand Dominance Right   Extremity/Trunk Assessment Upper Extremity Assessment Upper Extremity Assessment: Generalized weakness;LUE deficits/detail LUE Deficits / Details: L shoulder ROM deficit from prior sx LUE Coordination: decreased gross motor   Lower Extremity Assessment Lower Extremity Assessment: Defer to PT evaluation       Communication Communication Communication: No difficulties   Cognition Arousal/Alertness: Awake/alert Behavior During Therapy: WFL for tasks assessed/performed Overall Cognitive Status: Impaired/Different from baseline Area of Impairment: Memory;Problem solving                     Memory: Decreased short-term memory       Problem Solving: Slow processing;Requires verbal cues;Requires tactile cues General Comments: pt endoreses word finding difficulties, aware that her cognition is not quite at her baseline   General Comments  VSS on 2L O2    Exercises     Shoulder Instructions      Home Living Family/patient expects to be discharged to:: Private residence Living Arrangements: Children Available Help at Discharge: Personal care attendant;Available PRN/intermittently;Family Type of Home: House Home Access: Stairs to enter [employer] of Steps: 1+1 Entrance Stairs-Rails: None Home Layout: One level     Bathroom Shower/Tub: Occupational psychologist: Standard     Home Equipment: Environmental consultant - 4 wheels;Shower seat;Bedside commode;Grab bars - tub/shower   Additional Comments: Pt has daytime caregivers until 9 p      Prior Functioning/Environment Level of Independence: Needs assistance  Gait / Transfers Assistance Needed: uses rollator  ADL's / Homemaking Assistance Needed: caregivers assist with bathing, dressing and iADLS            OT Problem List: Decreased strength;Decreased range of motion;Decreased activity tolerance;Impaired balance (sitting and/or  standing);Decreased cognition;Decreased safety awareness;Decreased knowledge of use of DME or AE;Pain      OT Treatment/Interventions: Self-care/ADL training;Therapeutic exercise;Energy conservation;DME and/or AE instruction;Therapeutic activities;Visual/perceptual remediation/compensation;Patient/family education;Balance training;Cognitive remediation/compensation    OT Goals(Current goals can be found in the care plan section) Acute Rehab OT Goals Patient Stated Goal: feel better OT Goal Formulation: With patient Time For Goal Achievement: 03/06/19 Potential to Achieve Goals: Good  OT Frequency: Min 2X/week   Barriers to D/C:            Co-evaluation              AM-PAC OT "6 Clicks" Daily Activity     Outcome Measure Help from another person eating meals?: A Little Help from another person taking care of personal grooming?: A Little Help from another person toileting, which includes using toliet, bedpan, or urinal?: A Lot Help from another person bathing (including washing, rinsing, drying)?: A Lot Help from another person to put on and taking off regular upper body clothing?: A Little Help from another person to put on and taking off regular lower body clothing?: A Lot 6 Click Score: 15   End of Session Equipment Utilized During Treatment: Rolling walker;Oxygen Nurse Communication: Mobility status  Activity Tolerance: Patient tolerated treatment well Patient left: in bed;with call bell/phone within reach;with nursing/sitter in room(safety sitter present)  OT Visit Diagnosis: Muscle weakness (generalized) (M62.81);History of falling (Z91.81);Unsteadiness on feet (R26.81)  Time: 5733-4483 OT Time Calculation (min): 26 min Charges:  OT General Charges $OT Visit: 1 Visit OT Evaluation $OT Eval Moderate Complexity: 1 Mod OT Treatments $Self Care/Home Management : 8-22 mins  Shirley [name], OT Supplemental Rehabilitation Services Pager  [phone] Office [phone]   Raymondo Band 02/20/2019, 12:35 PM

## 2019-02-20 NOTE — Consult Note (Addendum)
[facility] Nurse Consult Note: Reason for Consult: Consult requested for right ankle.  Pt states she "had a chronic wound to this location for years." Wound type: Right outer ankle with full thickness wound, .2X.2X.2cm, dry red woundbed, no odor, drainage, or fluctuance Dressing procedure/placement/frequency: Topical treatment orders to promote moist healing provided for staff nurses to perform Q day as follows: Apply Bactroban to right ankle Q day and cover with foam dressing.  (Change foam dressing Q 3 days or PRN soiling.) Please re-consult if further assistance is needed.  Thank-you,  Julien Girt MSN, [place], [place], [place], [place]

## 2019-02-21 ENCOUNTER — Inpatient Hospital Stay (HOSPITAL_COMMUNITY): Payer: PPO

## 2019-02-21 LAB — BASIC METABOLIC PANEL
Anion gap: 8 (ref 5–15)
BUN: 24 mg/dL — ABNORMAL HIGH (ref 8–23)
CO2: 22 mmol/L (ref 22–32)
Calcium: 8.7 mg/dL — ABNORMAL LOW (ref 8.9–10.3)
Chloride: 108 mmol/L (ref 98–111)
Creatinine, Ser: 1.21 mg/dL — ABNORMAL HIGH (ref 0.44–1.00)
GFR calc Af Amer: 49 mL/min — ABNORMAL LOW (ref >60)
GFR calc non Af Amer: 42 mL/min — ABNORMAL LOW (ref >60)
Glucose, Bld: 89 mg/dL (ref 70–99)
Potassium: 4.3 mmol/L (ref 3.5–5.1)
Sodium: 138 mmol/L (ref 135–145)

## 2019-02-21 LAB — CBC WITH DIFFERENTIAL/PLATELET
Abs Immature Granulocytes: 0.06 10*3/uL (ref 0.00–0.07)
Basophils Absolute: 0.1 10*3/uL (ref 0.0–0.1)
Basophils Relative: 1 %
Eosinophils Absolute: 0.3 10*3/uL (ref 0.0–0.5)
Eosinophils Relative: 4 %
HCT: 29.8 % — ABNORMAL LOW (ref 36.0–46.0)
Hemoglobin: 9.2 g/dL — ABNORMAL LOW (ref 12.0–15.0)
Immature Granulocytes: 1 %
Lymphocytes Relative: 19 %
Lymphs Abs: 1.7 10*3/uL (ref 0.7–4.0)
MCH: 28.9 pg (ref 26.0–34.0)
MCHC: 30.9 g/dL (ref 30.0–36.0)
MCV: 93.7 fL (ref 80.0–100.0)
Monocytes Absolute: 0.8 10*3/uL (ref 0.1–1.0)
Monocytes Relative: 9 %
Neutro Abs: 6 10*3/uL (ref 1.7–7.7)
Neutrophils Relative %: 66 %
Platelets: 655 10*3/uL — ABNORMAL HIGH (ref 150–400)
RBC: 3.18 MIL/uL — ABNORMAL LOW (ref 3.87–5.11)
RDW: 14.4 % (ref 11.5–15.5)
WBC: 8.9 10*3/uL (ref 4.0–10.5)
nRBC: 0 % (ref 0.0–0.2)

## 2019-02-21 LAB — PROCALCITONIN: Procalcitonin: <0.1 ng/mL

## 2019-02-21 LAB — BRAIN NATRIURETIC PEPTIDE: B Natriuretic Peptide: 673.1 pg/mL — ABNORMAL HIGH (ref 0.0–100.0)

## 2019-02-21 MED ORDER — FUROSEMIDE 10 MG/ML IJ SOLN
20.0000 mg | Freq: Once | INTRAMUSCULAR | Status: AC
  Administered 2019-02-21: 20 mg via INTRAVENOUS
  Filled 2019-02-21: qty 2

## 2019-02-21 NOTE — Progress Notes (Signed)
Update the patient's daughter [name] via phone.  All questions answered.

## 2019-02-21 NOTE — Progress Notes (Signed)
PROGRESS NOTE  Shirley [name] VOH:[id number] DOB: 02/16/1941 DOA: 02/18/2019 PCP: Ardith Dark, PA-C  HPI/Recap of past 24 hours: Shirley [name] is a 78 y.o. female with medical history significant of PAT, neuropathy, fibromyalgia, GERD, chronic pain syndrome, asthma who presents for confusion, SOB and cough.  [name] was just recently admitted to the hospital for a fall with a laceration on her medial left leg and a UTI.   Elevated creatinine on presentation.  Received IV fluids with improvement of her renal function.  IV fluids stopped due to concern for volume overload.  Admission x-ray unrevealing.  Persistent nonproductive cough prompted a repeat chest x-ray 12/30 which showed cardiomegaly with mild increase in pulmonary vascularity suggestive of pulmonary edema.  IV Lasix added judiciously to avoid dehydration and recurrence of AKI.  Procalcitonin negative.  Urine culture done during this admission no growth final.  Will DC Rocephin.  She is on p.o. azithromycin for its anti-inflammatory properties.  02/21/19: Seen and examined.  She is alert and oriented x3.  States her cough is persistent but improving.  Assessment/Plan: Active Problems:   PAT (paroxysmal atrial tachycardia) / MAT   Lower extremity edema   Chronic pain syndrome   Acute metabolic encephalopathy   Fibromyalgia   AKI (acute kidney injury) ([place])   Hypertension   Recurrent falls   CKD (chronic kidney disease), stage III   Chronic diastolic CHF (congestive heart failure) (HCC)   Asthma   Dehydration   Leg laceration  AKI on CKD 3B likely prerenal in the setting of dehydration from poor oral intake.   - Patient with difficult time keeping up with fluid intake, also on torsemide for diastolic CHF and lower extremity edema. Baseline creatinine 1.2 with GFR 42. Torsemide was held on admission and she received IV fluids. She is back to her baseline renal function. Continue to avoid nephrotoxins Continue to monitor  urine output Continue daily BMPs.  Resolved Klebsiella UTI Urine culture from 02/15/2019 grew greater than 100,000 colonies of Klebsiella pneumonia, resistant to ampicillin, sensitive to Rocephin Repeat urine culture on 02/19/2019 no growth final. Leukocytosis resolved and afebrile. Procalcitonin negative. DC Rocephin 02/21/2019.  Mild pulmonary edema post IV fluid hydration Negative procalcitonin Continue pulmonary toilet Continue and complete Z-Pak course Judicious use of IV Lasix to avoid dehydration/ intravascular volume depletion/AKI O2 saturation 100% on room air.  Right ankle chronic wound, POA Continue local wound care as recommended by wound care specialist.    Mildly elevated troponin Likely elevated in the setting of worsening renal function Troponin S peaked at 35 and trended down. Denies any anginal symptoms at the time of this visit.  Hyperglycemia, resolved. Presented with elevated blood sugar Hemoglobin A1c 6.4 on 02/19/2019. Not on diabetic medications at home. Blood sugar at goal at this time.  Anemia of chronic disease complicated by iron deficiency Iron studies suggestive of iron deficiency. Continue ferrous sulfate 325 mg daily. Hemoglobin is stable.  Chronic diastolic CHF Euvolemic on exam Last 2D echo 03/18/16, LVEF 65 to 70% with grade 1 diastolic dysfunction Continue strict I's and O's and daily weight Judicious use of IV Lasix, diuretics, to avoid dehydration/ intravascular volume depletion/AKI  Resolved acute metabolic encephalopathy in the setting of UTI and acute illness Treat underlying conditions At the time of this visit she is alert and oriented x3. CT head done on 02/14/2019 showed no acute intracranial findings.  Revealed findings consistent with age-related atrophy and chronic small vessel ischemia, prior left parietal lobe occipital  lobe infarct. Continue fall precautions in place.  Essential hypertension Blood pressure is at  goal Continue home medications  Peripheral neuropathy Continue home medications    PAT (paroxysmal atrial tachycardia) / MAT - Continue home metoprolol -Continue to monitor on telemetry    Chronic pain syndrome Fibromyalgia - Given age and renal function, confusion/encephalopathy could be due to medication effects - Continue flexeril  - Hold scheduled oxycodone - prn only for pain - Continue gabapentin at lower dose - Continue prozac - Decrease amitriptyline to 12.5mg    Ambulatory dysfunction/recurrent falls -PT assessed and recommended SNF CSW following and assisting with placement. Continue PT OT with assistance and fall precautions.      Leg laceration - Sutures placed 4 days ago, if still in house can likely come out in 2-3 days.  -No acute issues.   DVT prophylaxis: Lovenox subcu daily Code Status: DNR, confirmed with daughter  Family Communication:  Updated her daughter via phone on 02/20/2019.  Disposition Plan:  Plan to discharge in the next 24 to 48 hours once bed is available.  Consults called: None    Objective: Vitals:   02/21/19 0006 02/21/19 0447 02/21/19 1052 02/21/19 1200  BP:  (!) 142/66  (!) 148/71  Pulse:  63  66  Resp:  20  18  Temp:  97.7 F (36.5 C)  98 F (36.7 C)  TempSrc:  Oral  Oral  SpO2:  97% 99% 100%  Weight: 75.8 kg     Height:        Intake/Output Summary (Last 24 hours) at 02/21/2019 1211 Last data filed at 02/21/2019 0700 Gross per 24 hour  Intake 820 ml  Output 1050 ml  Net -230 ml   Filed Weights   02/18/19 1913 02/19/19 0057 02/21/19 0006  Weight: 72.6 kg 74.8 kg 75.8 kg    Exam:  . General: 78 y.o. year-old female pleasant well-developed well-nourished no acute distress.  Alert and oriented x3.   . Cardiovascular: Regular rate and rhythm no rubs or gallops. Marland Kitchen Respiratory: Mild rales at bases no wheezing noted. Poor inspiratory effort. . Abdomen: Soft nontender normal bowel sounds  present.. Musculoskeletal: Trace lower extremity edema bilaterally.   Psychiatry: Mood is appropriate for condition and setting.  Data Reviewed: CBC: Recent Labs  Lab 02/16/19 0340 02/17/19 0423 02/18/19 1959 02/19/19 0255 02/19/19 0805 02/21/19 0507  WBC 6.9 7.9 12.2* 11.4*  --  8.9  NEUTROABS  --   --  9.8*  --   --  6.0  HGB 9.6* 10.2* 9.5* 9.1*  --  9.2*  HCT 30.9* 33.0* 31.6* 30.1* 27.2* 29.8*  MCV 93.4 95.1 96.6 96.2  --  93.7  PLT 579* 644* 547* 483*  --  381*   Basic Metabolic Panel: Recent Labs  Lab 02/16/19 0340 02/17/19 0423 02/18/19 1959 02/19/19 0255 02/20/19 0430 02/21/19 0507  NA 141 138 132* 135 139 138  K 3.8 4.5 5.0 4.6 4.3 4.3  CL 108 108 100 103 109 108  CO2 22 19* 21* 20* 20* 22  GLUCOSE 84 139* 164* 164* 109* 89  BUN 26* 27* 33* 32* 32* 24*  CREATININE 1.58* 1.48* 2.13* 2.23* 1.47* 1.21*  CALCIUM 8.7* 8.3* 9.0 9.1 8.7* 8.7*  MG 1.9  --   --   --   --   --    GFR: Estimated Creatinine Clearance: 36.9 mL/min (A) (by C-G formula based on SCr of 1.21 mg/dL (H)). Liver Function Tests: Recent Labs  Lab 02/14/19 1855 02/18/19  1959  AST 17 25  ALT 14 15  ALKPHOS 77 67  BILITOT 0.5 0.6  PROT 6.1* 6.3*  ALBUMIN 3.5 3.5   No results for input(s): LIPASE, AMYLASE in the last 168 hours. Recent Labs  Lab 02/15/19 1637  AMMONIA 21   Coagulation Profile: No results for input(s): INR, PROTIME in the last 168 hours. Cardiac Enzymes: No results for input(s): CKTOTAL, CKMB, CKMBINDEX, TROPONINI in the last 168 hours. BNP (last 3 results) No results for input(s): PROBNP in the last 8760 hours. HbA1C: Recent Labs    02/19/19 0406  HGBA1C 6.4*   CBG: Recent Labs  Lab 02/19/19 0625 02/20/19 0615  GLUCAP 181* 106*   Lipid Profile: Recent Labs    02/18/19 1956  TRIG 113   Thyroid Function Tests: Recent Labs    02/19/19 0255  TSH 2.515   Anemia Panel: Recent Labs    02/18/19 1959 02/19/19 0805  VITAMINB12  --  945*  FERRITIN  123 109  TIBC  --  258  IRON  --  23*   Urine analysis:    Component Value Date/Time   COLORURINE YELLOW 02/19/2019 [place] 02/19/2019 0558   LABSPEC 1.009 02/19/2019 0558   PHURINE 5.0 02/19/2019 0558   GLUCOSEU NEGATIVE 02/19/2019 0558   HGBUR NEGATIVE 02/19/2019 0558   BILIRUBINUR NEGATIVE 02/19/2019 0558   KETONESUR NEGATIVE 02/19/2019 0558   PROTEINUR NEGATIVE 02/19/2019 0558   NITRITE NEGATIVE 02/19/2019 0558   LEUKOCYTESUR NEGATIVE 02/19/2019 0558   Sepsis Labs: @LABRCNTIP (procalcitonin:4,lacticidven:4)  ) Recent Results (from the past 240 hour(s))  SARS CORONAVIRUS 2 (TAT 6-24 HRS) Nasopharyngeal Nasopharyngeal Swab     Status: None   Collection Time: 02/14/19  9:18 PM   Specimen: Nasopharyngeal Swab  Result Value Ref Range Status   SARS Coronavirus 2 NEGATIVE NEGATIVE Final    Comment: (NOTE) SARS-CoV-2 target nucleic acids are NOT DETECTED. The SARS-CoV-2 RNA is generally detectable in upper and lower respiratory specimens during the acute phase of infection. Negative results do not preclude SARS-CoV-2 infection, do not rule out co-infections with other pathogens, and should not be used as the sole basis for treatment or other patient management decisions. Negative results must be combined with clinical observations, patient history, and epidemiological information. The expected result is Negative. Fact Sheet for Patients: SugarRoll.be Fact Sheet for Healthcare Providers: https://www.woods-mathews.com/ This test is not yet approved or cleared by the [place] FDA and  has been authorized for detection and/or diagnosis of SARS-CoV-2 by FDA under an Emergency Use Authorization (EUA). This EUA will remain  in effect (meaning this test can be used) for the duration of the COVID-19 declaration under Section 56 4(b)(1) of the Act, 21 U.S.C. section 360bbb-3(b)(1), unless the authorization is terminated  or revoked sooner. Performed at [facility], [place] [address] [place], [place] [address]   Culture, Urine     Status: Abnormal   Collection Time: 02/15/19 12:40 AM   Specimen: Urine, Random  Result Value Ref Range Status   Specimen Description URINE, RANDOM  Final   Special Requests   Final    NONE Performed at [facility], [place] [address], [place], [place] [address]    Culture >=100,000 COLONIES/mL KLEBSIELLA PNEUMONIAE (A)  Final   Report Status 02/17/2019 FINAL  Final   Organism ID, Bacteria KLEBSIELLA PNEUMONIAE (A)  Final      Susceptibility   Klebsiella pneumoniae - MIC*    AMPICILLIN >=32 RESISTANT Resistant  CEFAZOLIN <=4 SENSITIVE Sensitive     CEFTRIAXONE <=1 SENSITIVE Sensitive     CIPROFLOXACIN <=0.25 SENSITIVE Sensitive     GENTAMICIN <=1 SENSITIVE Sensitive     IMIPENEM <=0.25 SENSITIVE Sensitive     NITROFURANTOIN 64 INTERMEDIATE Intermediate     TRIMETH/SULFA <=20 SENSITIVE Sensitive     AMPICILLIN/SULBACTAM 4 SENSITIVE Sensitive     PIP/TAZO <=4 SENSITIVE Sensitive     * >=100,000 COLONIES/mL KLEBSIELLA PNEUMONIAE  MRSA PCR Screening     Status: None   Collection Time: 02/15/19  4:00 AM   Specimen: Nasal Mucosa; Nasopharyngeal  Result Value Ref Range Status   MRSA by PCR NEGATIVE NEGATIVE Final    Comment:        The GeneXpert MRSA Assay (FDA approved for NASAL specimens only), is one component of a comprehensive MRSA colonization surveillance program. It is not intended to diagnose MRSA infection nor to guide or monitor treatment for MRSA infections. Performed at [facility], [place] [address], [place], [place] [address]   Blood Culture (routine x 2)     Status: None (Preliminary result)   Collection Time: 02/18/19  7:56 PM   Specimen: BLOOD RIGHT HAND  Result Value Ref Range Status   Specimen Description BLOOD RIGHT HAND  Final   Special Requests   Final    BOTTLES DRAWN AEROBIC AND ANAEROBIC Blood Culture results may not be  optimal due to an inadequate volume of blood received in culture bottles   Culture   Final    NO GROWTH 3 DAYS Performed at [facility], [place] [address], [place], [place] [address]    Report Status PENDING  Incomplete  Blood Culture (routine x 2)     Status: None (Preliminary result)   Collection Time: 02/18/19  7:56 PM   Specimen: BLOOD LEFT FOREARM  Result Value Ref Range Status   Specimen Description BLOOD LEFT FOREARM  Final   Special Requests   Final    BOTTLES DRAWN AEROBIC AND ANAEROBIC Blood Culture adequate volume   Culture   Final    NO GROWTH 3 DAYS Performed at [facility], [place] [address], [place], [place] [address]    Report Status PENDING  Incomplete  Respiratory Panel by RT PCR (Flu A&B, Covid) - Nasopharyngeal Swab     Status: None   Collection Time: 02/18/19  9:21 PM   Specimen: Nasopharyngeal Swab  Result Value Ref Range Status   SARS Coronavirus 2 by RT PCR NEGATIVE NEGATIVE Final    Comment: (NOTE) SARS-CoV-2 target nucleic acids are NOT DETECTED. The SARS-CoV-2 RNA is generally detectable in upper respiratoy specimens during the acute phase of infection. The lowest concentration of SARS-CoV-2 viral copies this assay can detect is 131 copies/mL. A negative result does not preclude SARS-Cov-2 infection and should not be used as the sole basis for treatment or other patient management decisions. A negative result may occur with  improper specimen collection/handling, submission of specimen other than nasopharyngeal swab, presence of viral mutation(s) within the areas targeted by this assay, and inadequate number of viral copies (<131 copies/mL). A negative result must be combined with clinical observations, patient history, and epidemiological information. The expected result is Negative. Fact Sheet for Patients:  PinkCheek.be Fact Sheet for Healthcare Providers:  GravelBags.it This test  is not yet ap proved or cleared by the [place] FDA and  has been authorized for detection and/or diagnosis of SARS-CoV-2 by FDA under an Emergency Use Authorization (EUA).  This EUA will remain  in effect (meaning this test can be used) for the duration of the COVID-19 declaration under Section 564(b)(1) of the Act, 21 U.S.C. section 360bbb-3(b)(1), unless the authorization is terminated or revoked sooner.    Influenza A by PCR NEGATIVE NEGATIVE Final   Influenza B by PCR NEGATIVE NEGATIVE Final    Comment: (NOTE) The Xpert Xpress SARS-CoV-2/FLU/RSV assay is intended as an aid in  the diagnosis of influenza from Nasopharyngeal swab specimens and  should not be used as a sole basis for treatment. Nasal washings and  aspirates are unacceptable for Xpert Xpress SARS-CoV-2/FLU/RSV  testing. Fact Sheet for Patients: PinkCheek.be Fact Sheet for Healthcare Providers: GravelBags.it This test is not yet approved or cleared by the [place] FDA and  has been authorized for detection and/or diagnosis of SARS-CoV-2 by  FDA under an Emergency Use Authorization (EUA). This EUA will remain  in effect (meaning this test can be used) for the duration of the  Covid-19 declaration under Section 564(b)(1) of the Act, 21  U.S.C. section 360bbb-3(b)(1), unless the authorization is  terminated or revoked. Performed at [facility], [place] [address] [place], [place] [address]   Culture, Urine     Status: None   Collection Time: 02/19/19  5:49 AM   Specimen: Urine, Catheterized  Result Value Ref Range Status   Specimen Description URINE, CATHETERIZED  Final   Special Requests NONE  Final   Culture   Final    NO GROWTH Performed at [facility], [address], [place], [place] [address]    Report Status 02/20/2019 FINAL  Final      Studies: DG CHEST PORT 1 VIEW  Result Date: 02/21/2019 CLINICAL DATA:  Cough and  shortness of breath EXAM: PORTABLE CHEST 1 VIEW COMPARISON:  Three days ago FINDINGS: Low volume chest with hazy and streaky density at the bases. No effusion or pneumothorax. Normal heart size when accounting for rotation. 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 Spinal fixation with scoliosis. IMPRESSION: Stable low volume chest with infiltrate or atelectasis at the bases. Electronically Signed   By: Monte Fantasia M.D.   On: 02/21/2019 08:14    Scheduled Meds: . amitriptyline  12.5 mg Oral QHS  . aspirin EC  81 mg Oral Daily  . azithromycin  250 mg Oral Daily  . chlorpheniramine-HYDROcodone  5 mL Oral Q12H  . cyclobenzaprine  10 mg Oral BID  . enoxaparin (LOVENOX) injection  40 mg Subcutaneous Q24H  . ferrous sulfate  325 mg Oral Q breakfast  . FLUoxetine  40 mg Oral Daily  . furosemide  20 mg Intravenous Once  . gabapentin  300 mg Oral BID  . guaiFENesin-dextromethorphan  10 mL Oral TID  . ipratropium-albuterol  3 mL Nebulization BID  . metoprolol tartrate  25 mg Oral BID  . mupirocin cream   Topical Daily  . pantoprazole  40 mg Oral Daily  . rosuvastatin  5 mg Oral Daily  . sodium chloride flush  3 mL Intravenous Q12H    Continuous Infusions: . sodium chloride 10 mL/hr at 02/19/19 0626  . cefTRIAXone (ROCEPHIN)  IV 1 g (02/21/19 0224)     LOS: 3 days     Kayleen Memos, MD [facility] Pager [phone]  If 7PM-7AM, please contact night-coverage [email or web address] Password [email or web address] 02/21/2019, 12:11 PM

## 2019-02-21 NOTE — Evaluation (Signed)
Clinical/Bedside Swallow Evaluation Patient Details  Name: Shirley [name] MRN: [id number] Date of Birth: 09-10-1940  Today's Date: 02/21/2019 Time: SLP Start Time (ACUTE ONLY): 1452 SLP Stop Time (ACUTE ONLY): 1503 SLP Time Calculation (min) (ACUTE ONLY): 11 min  Past Medical History:  Past Medical History:  Diagnosis Date  . Allergy    vasomotor rhinitis  . Anemia   . Asthma    h/o asthma and bronchitis  . BV (bacterial vaginosis)    history of frequent  . Cervical spondylosis    herniated disk protruding @ C6-7, impinging cord and left axillary root sleeve.  . Chronic pain   . Closed olecranon fracture, left, initial encounter   . Depression   . DVT of upper extremity (deep vein thrombosis) ([place])    h/o left(after wrist fracture/surgery); no residual thrombus or phlebitis by Dopplers July 2014  . Dyspnea    with exertion  . Edema   . Fibromyalgia    sees Dr.Phillips-pain management  . GERD (gastroesophageal reflux disease)    h/o  . Headache    marigraine- hormal - none in years  . Heart murmur   . Hypertension    never has been treated that daughter is aware  . Neuropathy   . Neuropathy   . Osteopenia    mild at hip (T-1.3)  . PAT (paroxysmal atrial tachycardia) ([place])   . Pneumonia    years ago  . Recurrent falls 04/16/2016  . Varicose veins   . 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 Eilleen Kempf @ [facility]  . Vision problem    wears glasses  . Vitamin D deficiency    Past Surgical History:  Past Surgical History:  Procedure Laterality Date  . ABDOMINAL HYSTERECTOMY  1999   BSO for endometriosis  . APPENDECTOMY    . BACK SURGERY  age 35   ruptured disk L3-4   . CATARACT EXTRACTION  2000   B/L  . COLONOSCOPY    . FINGER SURGERY     left finger for  tender rupture from fall.  Marland Kitchen FOOT SURGERY     multiple(at least 4 on right, 5 on left)-left Dr.Kerner([place])11/08,left Dr.Nunley-excision 2nd and 3rd mt heads w/artho and hammertoe surgery 4th and 5th 04/2006. left great toe IP fusion and revision of fusion of 2nd through 5th toes 12/2005. Right foot surgeries  Dr.Bednarz 04/2008 and 04/2009.  Marland Kitchen KNEE SURGERY Right    right knee replacement  . NECK SURGERY  2007   cervical  . ORIF ELBOW FRACTURE Left 03/22/2016   Procedure: OPEN REDUCTION INTERNAL FIXATION (ORIF) ELBOW/OLECRANON FRACTURE;  Surgeon: Altamese [place], MD;  Location: [place];  Service: Orthopedics;  Laterality: Left;  . ORIF ELBOW FRACTURE Left 04/15/2016   Procedure: OPEN REDUCTION INTERNAL FIXATION (ORIF) LEFT ELBOW/OLECRANON FRACTURE;  Surgeon: Altamese [place], MD;  Location: [facility];  Service: Orthopedics;  Laterality: Left;  . RADIOLOGY WITH ANESTHESIA Left 01/06/2016   Procedure: MRI LEFT HIP WITH AND WITHOUT;  Surgeon: Medication Radiologist, MD;  Location: [facility];  Service: Radiology;  Laterality: Left;  . REFRACTIVE SURGERY  2007   left eye  . REVERSE SHOULDER ARTHROPLASTY Left 12/31/2016   Procedure: REVERSE LEFT SHOULDER ARTHROPLASTY;  Surgeon: Netta Cedars, MD;  Location: [facility];  Service: Orthopedics;  Laterality: Left;  .  SHOULDER SURGERY     x8 (4 on rt side and 4 on left side)  . SPINAL FUSION  6/09   Dr.Cohen  . STERIOD INJECTION Left 04/15/2016   Procedure: LEFT HIP STEROID INJECTION;  Surgeon: Altamese Springer, MD;  Location: [facility];  Service: Orthopedics;  Laterality: Left;  . TONSILLECTOMY AND ADENOIDECTOMY    . TOTAL HIP ARTHROPLASTY Left 08/18/2016   Procedure: LEFT TOTAL HIP ARTHROPLASTY ANTERIOR APPROACH;  Surgeon: Gaynelle Arabian, MD;  Location: [facility];  Service: Orthopedics;  Laterality: Left;  . TRANSTHORACIC ECHOCARDIOGRAM  July 2012   Normal EF 60-65% , mild concentric LVH. Grade 2 diastolic dysfunction with elevated EDP. Massive left atrial dilation. Pulmonary  pressures 30-40 mm Hg; aortic sclerosis but no stenosis. Moderate TR  . TRANSTHORACIC ECHOCARDIOGRAM  03/2016   EF 65-70%. Normal wall motion. GR 1 DD. Severe LA dilation. Mild to moderate TR with mild to moderately elevated PA pressures (44 mmHg) mild aortic calcification but no stenosis. No source of emboli   . WRIST SURGERY Left    2 surgeries left wrist after fracture(complicated by DVT)   HPI:  Patient is a 78 y/o female who presents with confusion, SOB, cough and falls. Admitted with AKI and Klebsiella UTI. CXR- right atelectasis. Pt recently admitted 12/23 for left leg laceration due to fall and UTI. PMH includes chronic LBP, recurrent UTIs, recurrent falls. Patient denies difficulty chewing, denies coughing or choking with solids or liquids. However, patient does endorse globus sensation and points to sternum. She has hx of GERD, reports she takes some "acid reflux medicine" OTC and reports this helps alleviate the symptoms.   Assessment / Plan / Recommendation Clinical Impression    Pt seen for bedside swallowing evaluation. Pt in bed, HOB raised, on room air. Patient denies difficulty chewing, denies coughing or choking with solids or liquids. However, patient does endorse globus sensation and points to sternum when asked where sensation occurs. She has hx of GERD, reports she takes some "acid reflux medicine" OTC and reports this helps alleviate the symptoms. Oral mechanism and cranial nerve exam unremarkable aside from noted very mild tongue tremor. Patient seen with thin liquids, regular solids and puree solids. Pt able to self-feed independently, good oral acceptance, bolus manipulation/cohesion, oral transit, swallow did not appear to be delayed. Good oral clearance achieved. No overt s/s aspiration or distress were noted during this BSE. Recommend regular solids/thin liquids diet. No acute dysphagia services are recommended at this time. Please re-order if swallowing concerns should  arise.  Recommend referral to GI for symptoms consistent with esophageal phase dysfunction.  SLP Visit Diagnosis: Dysphagia, unspecified (R13.10)    Aspiration Risk       Diet Recommendation Regular;Thin liquid   Medication Administration: Whole meds with liquid Supervision: Patient able to self feed Postural Changes: Seated upright at 90 degrees    Other  Recommendations Recommended Consults: Consider GI evaluation;Consider esophageal assessment Oral Care Recommendations: Oral care BID   Follow up Recommendations Other (comment)(Esophageal consult)      Frequency and Duration            Prognosis        Swallow Study   General HPI: Patient is a 78 y/o female who presents with confusion, SOB, cough and falls. Admitted with AKI and Klebsiella UTI. CXR- right atelectasis. Pt recently admitted 12/23 for left leg laceration due to fall and UTI. PMH includes chronic LBP, recurrent UTIs, recurrent falls. Previous Swallow Assessment: none known Diet Prior to  this Study: Regular;Thin liquids Temperature Spikes Noted: No Respiratory Status: Room air Behavior/Cognition: Alert;Cooperative Oral Cavity Assessment: Within Functional Limits Oral Cavity - Dentition: Adequate natural dentition Vision: Functional for self-feeding Self-Feeding Abilities: Able to feed self Patient Positioning: Upright in bed Baseline Vocal Quality: Normal Volitional Cough: Strong Volitional Swallow: Able to elicit    Oral/Motor/Sensory Function Overall Oral Motor/Sensory Function: Within functional limits   Ice Chips Ice chips: Not tested   Thin Liquid Thin Liquid: Within functional limits Presentation: Self Fed;Straw;Cup    Nectar Thick Nectar Thick Liquid: Not tested   Honey Thick Honey Thick Liquid: Not tested   Puree Puree: Within functional limits Presentation: Self Fed   Solid     Solid: Within functional limits Presentation: Self Fed      Marina Goodell, M.Ed., CCC-SLP Speech  Therapy Acute Rehabilitation 02/21/2019,3:33 PM

## 2019-02-21 NOTE — Plan of Care (Signed)
  Problem: Pain Managment: Goal: General experience of comfort will improve Outcome: Progressing   Problem: Safety: Goal: Ability to remain free from injury will improve Outcome: Progressing   

## 2019-02-21 NOTE — Progress Notes (Signed)
Physical Therapy Treatment Patient Details Name: Shirley [name] MRN: [id number] DOB: 1940-03-29 Today's Date: 02/21/2019    History of Present Illness Patient is a 78 y/o female who presents with confusion, Shortness of breath, cough and falls. Admitted with AKI and Klebsiella UTI. CXR- right atelectasis. Pt recently admitted 12/23 for left leg laceration due to fall and UTI. PMH includes chronic LBP, recurrent UTIs, recurrent falls.    PT Comments    Pt making good progress today.  She was able to transfer with min A and ambulated 60' with RW and min A. O2 sats were stable on RA.  Does need cues for safety, transfer technique, and assist with RW.    Follow Up Recommendations  SNF; or Home with HHPT and supervision/Assistance - 24 hour - pending family decision and if 24 hr assist available.      Equipment Recommendations  None recommended by PT    Recommendations for Other Services       Precautions / Restrictions Precautions Precautions: Fall    Mobility  Bed Mobility Overal bed mobility: Needs Assistance Bed Mobility: Supine to Sit;Sit to Supine Rolling: Min assist   Supine to sit: Min assist Sit to supine: Min assist   General bed mobility comments: increased time; HOB elevated; cues for technique  Transfers Overall transfer level: Needs assistance Equipment used: Rolling walker (2 wheeled) Transfers: Sit to/from Stand Sit to Stand: Min assist         General transfer comment: required cues for safe hand placment and min A to boost  Ambulation/Gait Ambulation/Gait assistance: Min Web designer (Feet): 60 Feet   Gait Pattern/deviations: Step-through pattern;Decreased stride length;Shuffle     General Gait Details: noted pt with severe genu varus and toe out (baseline); mild unsteadiness but no overt LOB; assisted with RW; fatigued easily   Stairs             Wheelchair Mobility    Modified Rankin (Stroke Patients Only)       Balance  Overall balance assessment: Needs assistance Sitting-balance support: Feet supported;Bilateral upper extremity supported Sitting balance-Leahy Scale: Good     Standing balance support: Bilateral upper extremity supported;During functional activity Standing balance-Leahy Scale: Fair Standing balance comment: used RW; leans right due to posture but no LOB                            Cognition Arousal/Alertness: Awake/alert Behavior During Therapy: WFL for tasks assessed/performed Overall Cognitive Status: No family/caregiver present to determine baseline cognitive functioning                                 General Comments: Pt with sitter      Exercises      General Comments General comments (skin integrity, edema, etc.): on RA with O2 sats 97-98%      Pertinent Vitals/Pain Pain Assessment: No/denies pain Pain Score: 2  Faces Pain Scale: Hurts a little bit Pain Location: shoulder/neck Pain Descriptors / Indicators: Discomfort Pain Intervention(s): Monitored during session    Home Living                      Prior Function            PT Goals (current goals can now be found in the care plan section) Progress towards PT goals: Progressing toward goals    Frequency  Min 3X/week      PT Plan Current plan remains appropriate    Co-evaluation              AM-PAC PT "6 Clicks" Mobility   Outcome Measure  Help needed turning from your back to your side while in a flat bed without using bedrails?: A Little Help needed moving from lying on your back to sitting on the side of a flat bed without using bedrails?: A Little Help needed moving to and from a bed to a chair (including a wheelchair)?: A Little Help needed standing up from a chair using your arms (e.g., wheelchair or bedside chair)?: A Little Help needed to walk in hospital room?: A Little Help needed climbing 3-5 steps with a railing? : A Lot 6 Click Score: 17     End of Session Equipment Utilized During Treatment: Gait belt Activity Tolerance: Patient tolerated treatment well Patient left: in bed;with call bell/phone within reach;with nursing/sitter in room Nurse Communication: Mobility status PT Visit Diagnosis: Unsteadiness on feet (R26.81);Other abnormalities of gait and mobility (R26.89);Muscle weakness (generalized) (M62.81);Repeated falls (R29.6);Difficulty in walking, not elsewhere classified (R26.2)     Time: 0947-0962 PT Time Calculation (min) (ACUTE ONLY): 19 min  Charges:  $Gait Training: 8-22 mins                     Maggie Font, PT Acute Rehab Services Pager [phone] [facility] [phone] [facility] [place] 02/21/2019, 4:56 PM

## 2019-02-21 NOTE — Plan of Care (Signed)
  Problem: Activity: Goal: Risk for activity intolerance will decrease Outcome: Progressing   Problem: Coping: Goal: Level of anxiety will decrease Outcome: Progressing   Problem: Safety: Goal: Ability to remain free from injury will improve Outcome: Progressing   

## 2019-02-22 LAB — BASIC METABOLIC PANEL
Anion gap: 10 (ref 5–15)
BUN: 23 mg/dL (ref 8–23)
CO2: 22 mmol/L (ref 22–32)
Calcium: 8.5 mg/dL — ABNORMAL LOW (ref 8.9–10.3)
Chloride: 106 mmol/L (ref 98–111)
Creatinine, Ser: 1.28 mg/dL — ABNORMAL HIGH (ref 0.44–1.00)
GFR calc Af Amer: 46 mL/min — ABNORMAL LOW (ref >60)
GFR calc non Af Amer: 39 mL/min — ABNORMAL LOW (ref >60)
Glucose, Bld: 109 mg/dL — ABNORMAL HIGH (ref 70–99)
Potassium: 4.4 mmol/L (ref 3.5–5.1)
Sodium: 138 mmol/L (ref 135–145)

## 2019-02-22 LAB — SARS CORONAVIRUS 2 (TAT 6-24 HRS): SARS Coronavirus 2: NEGATIVE

## 2019-02-22 MED ORDER — FERROUS SULFATE 325 (65 FE) MG PO TABS: 325.0000 mg | ORAL_TABLET | Freq: Every day | ORAL | 0 refills | Status: AC

## 2019-02-22 MED ORDER — HYDROCOD POLST-CPM POLST ER 10-8 MG/5ML PO SUER: 5.0000 mL | Freq: Two times a day (BID) | ORAL | 0 refills | Status: AC

## 2019-02-22 MED ORDER — ASPIRIN 81 MG PO TBEC: 81.0000 mg | DELAYED_RELEASE_TABLET | Freq: Every day | ORAL | 0 refills | Status: AC

## 2019-02-22 MED ORDER — AMITRIPTYLINE HCL 25 MG PO TABS: 12.5000 mg | ORAL_TABLET | Freq: Every day | ORAL | 0 refills | Status: AC

## 2019-02-22 MED ORDER — GABAPENTIN 300 MG PO CAPS: 300.0000 mg | ORAL_CAPSULE | Freq: Two times a day (BID) | ORAL | 0 refills | Status: AC

## 2019-02-22 MED ORDER — OXYCODONE HCL 5 MG PO TABS: 5.0000 mg | ORAL_TABLET | Freq: Three times a day (TID) | ORAL | 0 refills | Status: AC | PRN

## 2019-02-22 MED ORDER — METOPROLOL TARTRATE 25 MG PO TABS: 25.0000 mg | ORAL_TABLET | Freq: Two times a day (BID) | ORAL | 0 refills | Status: AC

## 2019-02-22 MED ORDER — MUPIROCIN CALCIUM 2 % EX CREA: TOPICAL_CREAM | Freq: Every day | CUTANEOUS | 0 refills | Status: AC

## 2019-02-22 MED ORDER — IPRATROPIUM-ALBUTEROL 0.5-2.5 (3) MG/3ML IN SOLN
3.0000 mL | Freq: Two times a day (BID) | RESPIRATORY_TRACT | Status: DC
  Administered 2019-02-22: 3 mL via RESPIRATORY_TRACT

## 2019-02-22 MED ORDER — AZITHROMYCIN 250 MG PO TABS: ORAL_TABLET | ORAL | 0 refills | Status: AC

## 2019-02-22 MED ORDER — FLUOXETINE HCL 40 MG PO CAPS: 40.0000 mg | ORAL_CAPSULE | Freq: Every day | ORAL | 0 refills | Status: AC

## 2019-02-22 NOTE — TOC Transition Note (Addendum)
Transition of Care [facility]) - CM/SW Discharge Note   Patient Details  Name: Shirley [name] MRN: [id number] Date of Birth: [birth year]  [facility] of [facility]) CM/SW Contact:  Alberteen Sam,  Phone Number: [phone] 02/22/2019, 2:21 PM   Clinical Narrative:     CSW spoke with patient son Ron and patient daughter [name] both in agreement with patient going home with home health PT, OT, RN and aide,  first preference for [facility], otherwise agreeable to fax out referrals. No DME needs identified.   CSW initially reached out to [place] with [facility], no answer.   CSW sent referral to [facility], spoke with Brittney and she reports they are able to accept patient for home health PT and OT. [name] has been informed. Ron will pick patient up around 3pm today, RN made aware.     Final next level of care: Home w Home Health Services Barriers to Discharge: No Barriers Identified   Patient Goals and CMS Choice   CMS Medicare.gov Compare Post Acute Care list provided to:: Patient Represenative (must comment)(Ron) Choice offered to / list presented to : Adult Children  Discharge Placement                Patient to be transferred to facility by: family to take home (Ron) Name of family member notified: Ron Patient and family notified of of transfer: 02/22/19  Discharge Plan and Services                          HH Arranged: PT, OT HH Agency: [place] Date [place]: 02/22/19 Time [place]: [id number] Representative spoke with at [place]: [place] ([place]) Interventions     Readmission Risk Interventions No flowsheet data found.

## 2019-02-22 NOTE — Progress Notes (Signed)
Physical Therapy Treatment Patient Details Name: Shirley [name] MRN: [id number] DOB: Jul 30, 1940 Today's Date: 02/22/2019    History of Present Illness Patient is a 78 y/o female who presents with confusion, Shortness of breath, cough and falls. Admitted with AKI and Klebsiella UTI. CXR- right atelectasis. Pt recently admitted 12/23 for left leg laceration due to fall and UTI. PMH includes chronic LBP, recurrent UTIs, recurrent falls.    PT Comments    Patient is making progress toward PT goals and overall requires min guard/min A for mobility. Pt continues to become SOB with mobility and SpO2 WNL on RA. Pt will continue to benefit from further skilled PT services to maximize independence and safety with mobility.    HHPT appropriate pending family's ability to prove 24 hour supervision/assistance   Follow Up Recommendations  SNF;Supervision for mobility/OOB;Supervision/Assistance - 24 hour     Equipment Recommendations  None recommended by PT    Recommendations for Other Services       Precautions / Restrictions Precautions Precautions: Fall Precaution Comments: multiple falls at home Restrictions Weight Bearing Restrictions: No    Mobility  Bed Mobility Overal bed mobility: Needs Assistance Bed Mobility: Supine to Sit     Supine to sit: Min guard     General bed mobility comments: min guard for safety; use of rail and increased time to gett into sitting; pt slightly SOB with bed mobility  Transfers Overall transfer level: Needs assistance Equipment used: Rolling walker (2 wheeled) Transfers: Sit to/from Stand Sit to Stand: Min assist         General transfer comment: cues for safe hand placement; assist to power up into standing from EOB and recliner  Ambulation/Gait Ambulation/Gait assistance: Min assist;Min guard Gait Distance (Feet): 100 Feet Assistive device: Rolling walker (2 wheeled) Gait Pattern/deviations: Step-through pattern;Decreased stride  length Gait velocity: decreased   General Gait Details: assist to steady; no LOB; pt fatigued and SOB with ambulation   Stairs             Wheelchair Mobility    Modified Rankin (Stroke Patients Only)       Balance Overall balance assessment: Needs assistance Sitting-balance support: Feet supported;Bilateral upper extremity supported Sitting balance-Leahy Scale: Good     Standing balance support: Bilateral upper extremity supported;During functional activity Standing balance-Leahy Scale: Poor                              Cognition Arousal/Alertness: Awake/alert Behavior During Therapy: WFL for tasks assessed/performed Overall Cognitive Status: Within Functional Limits for tasks assessed                                        Exercises      General Comments General comments (skin integrity, edema, etc.): SpO2 WNL on RA      Pertinent Vitals/Pain Pain Assessment: No/denies pain    Home Living                      Prior Function            PT Goals (current goals can now be found in the care plan section) Progress towards PT goals: Progressing toward goals    Frequency    Min 3X/week      PT Plan Current plan remains appropriate    Co-evaluation  AM-PAC PT "6 Clicks" Mobility   Outcome Measure  Help needed turning from your back to your side while in a flat bed without using bedrails?: A Little Help needed moving from lying on your back to sitting on the side of a flat bed without using bedrails?: A Little Help needed moving to and from a bed to a chair (including a wheelchair)?: A Little Help needed standing up from a chair using your arms (e.g., wheelchair or bedside chair)?: A Little Help needed to walk in hospital room?: A Little Help needed climbing 3-5 steps with a railing? : A Lot 6 Click Score: 17    End of Session Equipment Utilized During Treatment: Gait belt Activity  Tolerance: Patient tolerated treatment well Patient left: with call bell/phone within reach;in chair;with chair alarm set Nurse Communication: Mobility status PT Visit Diagnosis: Unsteadiness on feet (R26.81);Other abnormalities of gait and mobility (R26.89);Muscle weakness (generalized) (M62.81);Repeated falls (R29.6);Difficulty in walking, not elsewhere classified (R26.2)     Time: 5809-9833 PT Time Calculation (min) (ACUTE ONLY): 27 min  Charges:  $Gait Training: 23-37 mins                     Earney Navy, PTA Acute Rehabilitation Services Pager: [phone] Office: [phone]     Darliss Cheney 02/22/2019, 2:55 PM

## 2019-02-22 NOTE — Discharge Summary (Signed)
Discharge Summary  Shirley [name] FKC:[id number] DOB: 11/18/1940  PCP: Shirley Dark, PA-C  Admit date: 02/18/2019 Discharge date: 02/22/2019  Time spent: 35 minutes   Recommendations for Outpatient Follow-up:  1. Follow-up with your primary care provider. 2. Please obtain GI referral from your primary care provider for symptoms suspicious for esophageal phase dysfunction. 3. Continue your medications as prescribed. 4. Continue PT OT with assistance and fall precautions.         Per speech therapist: Diet Recommendation   Assessment / Plan / Recommendation Clinical Impression    Pt seen for bedside swallowing evaluation. Pt in bed, HOB raised, on room air. Patient denies difficulty chewing, denies coughing or choking with solids or liquids. However, patient does endorse globus sensation and points to sternum when asked where sensation occurs. She has hx of GERD, reports she takes some "acid reflux medicine" OTC and reports this helps alleviate the symptoms. Oral mechanism and cranial nerve exam unremarkable aside from noted very mild tongue tremor. Patient seen with thin liquids, regular solids and puree solids. Pt able to self-feed independently, good oral acceptance, bolus manipulation/cohesion, oral transit, swallow did not appear to be delayed. Good oral clearance achieved. No overt s/s aspiration or distress were noted during this BSE. Recommend regular solids/thin liquids diet. No acute dysphagia services are recommended at this time. Please re-order if swallowing concerns should arise.  Recommend referral to GI for symptoms consistent with esophageal phase dysfunction.  SLP Visit Diagnosis: Dysphagia, unspecified (R13.10)    Regular;Thin liquid   Medication Administration: Whole meds with liquid Supervision: Patient able to self feed Postural Changes: Seated upright at 90 degrees    Other  Recommendations Recommended Consults: Consider GI evaluation; Consider  esophageal assessment Oral Care Recommendations: Oral care BID   Follow up Recommendations Other (comment)(Esophageal consult)       WOUND CARE RECOMMENDATIONS:  [facility] Nurse Consult Note: 02/20/19. Reason for Consult: Consult requested for right ankle.  Pt states she "had a chronic wound to this location for years." Wound type: Right outer ankle with full thickness wound, .2X.2X.2cm, dry red woundbed, no odor, drainage, or fluctuance Dressing procedure/placement/frequency: Topical treatment orders to promote moist healing provided for staff nurses to perform Q day as follows: Apply Bactroban to right ankle Q day and cover with foam dressing.  (Change foam dressing Q 3 days or PRN soiling.) Please re-consult if further assistance is needed.  Thank-you.  [facility] Nurse Consult Note: 02/15/19. Patient receiving care in [facility] 5N06.  Consult completed remotely after review of record. Photo of area not available. Reason for Consult: "leg lac" Wound type: trauma injury to leg requiring suturing in the ED. Pressure Injury POA: Yes/No/NA Measurement: Wound bed: Drainage (amount, consistency, odor)  Periwound: Dressing procedure/placement/frequency:  Place a Xeroform gauze Kellie Simmering [phone]) over the leg laceration, top with an ABD pad, secure with kerlex. Change daily.   Discharge Diagnoses:  Active Hospital Problems   Diagnosis Date Noted  . Leg laceration 02/14/2019  . Dehydration 09/30/2018  . CKD (chronic kidney disease), stage III 09/28/2018  . Chronic diastolic CHF (congestive heart failure) ([place]) 09/28/2018  . Asthma   . Recurrent falls 04/16/2016  . Hypertension   . AKI (acute kidney injury) ([place])   . Fibromyalgia   . Acute metabolic encephalopathy 44/96/7591  . Chronic pain syndrome 12/23/2015  . Lower extremity edema 08/27/2012  . PAT (paroxysmal atrial tachycardia) / MAT 08/19/2012    Resolved Hospital Problems  No resolved problems to display.  Discharge Condition:  Stable  Diet recommendation: Resume previous diet  Vitals:   02/22/19 0700 02/22/19 1213  BP:  107/63  Pulse: 62 66  Resp: 18 18  Temp:  98 F (36.7 C)  SpO2: 98% 99%    History of present illness:  Shirley J Autryis a 78 y.o.femalewith medical history significant ofPAT, neuropathy, fibromyalgia, GERD, chronic pain syndrome, asthma who presents for confusion, SOB and cough. Shirley [name] was just recently admitted to the hospital for a fall with a laceration on her medial left leg and a UTI.  Elevated creatinine on presentation.  Received IV fluids with improvement of her renal function.  IV fluids stopped due to concern for volume overload.  Admission x-ray unrevealing.  Persistent nonproductive cough prompted a repeat chest x-ray 12/30 which showed cardiomegaly with mild increase in pulmonary vascularity suggestive of pulmonary edema.  IV Lasix added judiciously to avoid dehydration and recurrence of AKI.  Procalcitonin negative.  Urine culture done during this admission no growth final.  Will DC Rocephin.  She is on p.o. azithromycin for its anti-inflammatory properties.  02/22/19: Seen and examined.  No acute events overnight.  She is alert and oriented x 3.  Answers all questions appropriately.  She has no new complaints.  Passed swallow evaluation by speech therapist with recommendation for regular consistency diet and thin liquids.  Vital signs and labs reviewed and are stable.  Repeat COVID-19 negative.  On the day of discharge, the patient was hemodynamically stable.  She will need to follow-up with her primary care provider posthospitalization and take her medications as prescribed.  She will also need to continue PT OT with assistance and fall precautions.   Hospital Course:  Active Problems:   PAT (paroxysmal atrial tachycardia) / MAT   Lower extremity edema   Chronic pain syndrome   Acute metabolic encephalopathy   Fibromyalgia   AKI (acute kidney injury) ([place])    Hypertension   Recurrent falls   CKD (chronic kidney disease), stage III   Chronic diastolic CHF (congestive heart failure) ([facility])   Asthma   Dehydration   Leg laceration  Resolving AKI on CKD 3B likely prerenal in the setting of dehydration from poor oral intake.   Previously on torsemide for diastolic CHF and lower extremity edema. Baseline creatinine 1.2 with GFR 42. Torsemide was held on admission and she received IV fluids. She is back to her baseline renal function. Continue to avoid nephrotoxins Follow-up with your primary care provider.  Resolved Klebsiella UTI Urine culture from 02/15/2019 grew greater than 100,000 colonies of Klebsiella pneumonia, resistant to ampicillin, sensitive to Rocephin.  Received Rocephin which was discontinued on 02/21/2019.  Repeat urine culture on 02/19/2019 no growth final. Leukocytosis resolved and afebrile. Procalcitonin negative.  Resolving mild pulmonary edema post IV fluid hydration/resolved acute on chronic hypoxic respiratory failure Not on oxygen supplementation at baseline Received bronchodilators and 1 dose of IV Lasix 20 mg once. O2 saturation 99% on room air. Negative procalcitonin Will complete course of azithromycin.  Right ankle chronic wound, POA Continue local wound care as recommended by wound care specialist.  Recommendations stated above  Mildly elevated troponin Likely elevated in the setting of worsening renal function Troponin S peaked at 35 and trended down. Denies any anginal symptoms.  Hyperglycemia, resolved, possibly stress-induced. Hemoglobin A1c 6.4 on 02/19/2019. Not on diabetic medications at home. Follow-up with your PCP.  Anemia of chronic disease complicated by iron deficiency Iron studies suggestive of iron deficiency. Continue ferrous sulfate 325  mg daily. Hemoglobin is stable.  Chronic diastolic CHF Euvolemic on exam Last 2D echo 03/18/16, LVEF 65 to 70% with grade 1 diastolic  dysfunction Diuretics on hold due to recent AKI  Resolved acute metabolic encephalopathy in the setting of UTI and acute illness CT head done on 02/14/2019 showed no acute intracranial findings.  Revealed findings consistent with age-related atrophy and chronic small vessel ischemia, prior left parietal lobe occipital lobe infarct. Continue fall precautions in place At the time of this visit patient is alert oriented x3 and answers all questions appropriately.  Essential hypertension Blood pressure is at goal. Continue metoprolol p.o. 25 mg twice daily.  Peripheral neuropathy Continue home medications  PAT (paroxysmal atrial tachycardia) / MAT - Continue home metoprolol -Continue to monitor on telemetry  Chronic pain syndrome Fibromyalgia -Continue home medication-follow-up with your PCP  Ambulatory dysfunction/recurrent falls -PT assessed and recommended SNF CSW following and assisting with placement. Continue PT OT with assistance and fall precautions.    Leg laceration -Continue local wound care.  Code Status:DNR, confirmed with daughter   Consults called:None     Discharge Exam: BP 107/63 (BP Location: Left Arm)   Pulse 66   Temp 98 F (36.7 C)   Resp 18   Ht 5\' 4"  (1.626 m)   Wt 76.2 kg   SpO2 99%   BMI 28.84 kg/m  . General: 78 y.o. year-old female well developed well nourished in no acute distress.  Alert and oriented x3. . Cardiovascular: Regular rate and rhythm with no rubs or gallops.  No thyromegaly or JVD noted.  Marland Kitchen Respiratory: Clear to auscultation with no wheezes or rales. Good inspiratory effort. . Abdomen: Soft nontender nondistended with normal bowel sounds x4 quadrants. . Musculoskeletal: No lower extremity edema. Marland Kitchen Psychiatry: Mood is appropriate for condition and setting  Discharge Instructions You were cared for by a hospitalist during your hospital stay. If you have any questions about your discharge medications or the  care you received while you were in the hospital after you are discharged, you can call the unit and asked to speak with the hospitalist on call if the hospitalist that took care of you is not available. Once you are discharged, your primary care physician will handle any further medical issues. Please note that NO REFILLS for any discharge medications will be authorized once you are discharged, as it is imperative that you return to your primary care physician (or establish a relationship with a primary care physician if you do not have one) for your aftercare needs so that they can reassess your need for medications and monitor your lab values.   Allergies as of 02/22/2019      Reactions   Buspirone Hcl Other (See Comments)   Headaches, twitching      Medication List    STOP taking these medications   amLODipine 5 MG tablet Commonly known as: NORVASC   buPROPion 300 MG 24 hr tablet Commonly known as: WELLBUTRIN XL   cefdinir 300 MG capsule Commonly known as: OMNICEF   cefpodoxime 200 MG tablet Commonly known as: VANTIN   cyclobenzaprine 10 MG tablet Commonly known as: FLEXERIL   hydrOXYzine 25 MG capsule Commonly known as: VISTARIL     TAKE these medications   acetaminophen 325 MG tablet Commonly known as: TYLENOL Take 2 tablets (650 mg total) by mouth every 6 (six) hours as needed for mild pain, fever or headache (or Fever >/= 101).   amitriptyline 25 MG tablet Commonly known as:  ELAVIL Take 0.5 tablets (12.5 mg total) by mouth at bedtime. What changed: how much to take   aspirin 81 MG EC tablet Take 1 tablet (81 mg total) by mouth daily.   azithromycin 250 MG tablet Commonly known as: ZITHROMAX Acute bronchitis   chlorpheniramine-HYDROcodone 10-8 MG/5ML Suer Commonly known as: TUSSIONEX Take 5 mLs by mouth every 12 (twelve) hours for 3 days.   ferrous sulfate 325 (65 FE) MG tablet Take 1 tablet (325 mg total) by mouth daily with breakfast.   FLUoxetine 40 MG  capsule Commonly known as: PROZAC Take 1 capsule (40 mg total) by mouth daily.   gabapentin 300 MG capsule Commonly known as: NEURONTIN Take 1 capsule (300 mg total) by mouth 2 (two) times daily. What changed:   how much to take  when to take this  additional instructions   lansoprazole 30 MG capsule Commonly known as: PREVACID Take 30 mg by mouth daily.   metoprolol tartrate 25 MG tablet Commonly known as: LOPRESSOR Take 1 tablet (25 mg total) by mouth 2 (two) times daily.   mupirocin cream 2 % Commonly known as: BACTROBAN Apply topically daily for 5 days.   oxyCODONE 5 MG immediate release tablet Commonly known as: Oxy IR/ROXICODONE Take 1 tablet (5 mg total) by mouth every 8 (eight) hours as needed for moderate pain. What changed:   medication strength  how much to take  when to take this  reasons to take this   rosuvastatin 5 MG tablet Commonly known as: CRESTOR Take 5 mg by mouth daily.   torsemide 10 MG tablet Commonly known as: DEMADEX Take 1 tablet (10 mg total) by mouth every other day as needed (for fluid).            Durable Medical Equipment  (From admission, onward)         Start     Ordered   02/21/19 0539  For home use only DME standard manual wheelchair with seat cushion  Once    Comments: Patient suffers from ambulatory dysfunction which impairs their ability to perform daily activities like walking in the home.  A walking aid will not resolve issue with performing activities of daily living. A wheelchair will allow patient to safely perform daily activities. Patient can safely propel the wheelchair in the home or has a caregiver who can provide assistance. Length of need 6 months. Accessories: elevating leg rests, wheel locks, extensions and anti-tippers.   02/21/19 0539         Allergies  Allergen Reactions  . Buspirone Hcl Other (See Comments)    Headaches, twitching   Contact information for after-discharge care     Destination    [facility] .   Service: Skilled Nursing Contact information: [address] [place] [phone]               The results of significant diagnostics from this hospitalization (including imaging, microbiology, ancillary and laboratory) are listed below for reference.    Significant Diagnostic Studies: DG Chest 1 View  Result Date: 02/14/2019 CLINICAL DATA:  Altered behavior, low back pain, pelvic pain, distal LEFT lower leg laceration, fell at home today, history asthma, heart murmur, hypertension, pneumonia, spinal surgery and scoliosis, former smoker, initial encounter EXAM: CHEST  1 VIEW COMPARISON:  09/28/2018 FINDINGS: Rotated to the RIGHT. Upper normal heart size. Tortuous aorta. Mediastinal contours and pulmonary vascularity grossly stable. RIGHT basilar atelectasis without infiltrate, pleural effusion or pneumothorax. Bones demineralized  with note of prior LEFT shoulder replacement and thoracolumbar spinal fusion. Single screw noted at RIGHT infra glenoid region of scapula. Degenerative changes of RIGHT glenohumeral joint with chronic rotator cuff tear suspected. IMPRESSION: RIGHT basilar atelectasis. Electronically Signed   By: Lavonia Dana M.D.   On: 02/14/2019 20:52   DG Lumbar Spine Complete  Result Date: 02/14/2019 CLINICAL DATA:  Altered behavior, low back pain, pelvic pain, distal LEFT lower leg laceration, fell at home today, history asthma, heart murmur, hypertension, pneumonia, spinal surgery and scoliosis, former smoker, initial encounter EXAM: LUMBAR SPINE - COMPLETE 4+ VIEW COMPARISON:  None FINDINGS: Osseous demineralization. Prior thoracolumbar fusion extending from T10 into sacrum. Fracture of the RIGHT fusion rod inferior to the RIGHT S1 pedicle screw. Remaining hardware appears intact. Fixation hardware extends across RIGHT SI joint into RIGHT iliac bone. Dextroconvex scoliosis. No fracture,  subluxation or bone destruction. Atherosclerotic calcifications aorta. IMPRESSION: Prior thoracolumbosacral fusion with fracture of the RIGHT spinal rod inferior to the RIGHT S1 pedicle screw. Dextroconvex scoliosis. Osseous demineralization without definite acute bony abnormality. Electronically Signed   By: Lavonia Dana M.D.   On: 02/14/2019 20:51   DG Pelvis 1-2 Views  Result Date: 02/14/2019 CLINICAL DATA:  Altered behavior, low back pain, pelvic pain, distal LEFT lower leg laceration, fell at home today, history asthma, heart murmur, hypertension, pneumonia, spinal surgery and scoliosis, former smoker, initial encounter EXAM: PELVIS - 1-2 VIEW COMPARISON:  None FINDINGS: Prior lumbo spinal fusion with a RIGHT-side screw extending into RIGHT iliac bone. Fracture of the RIGHT spinal rod inferior to the RIGHT S1 pedicle screw. Post LEFT total hip arthroplasty. Pelvis intact. No acute fracture, dislocation, or bone destruction identified. IMPRESSION: Fracture of the RIGHT spinal rod inferior to the RIGHT S1 pedicle screw. LEFT hip prosthesis. No acute fracture or dislocation identified. Electronically Signed   By: Lavonia Dana M.D.   On: 02/14/2019 20:54   DG Tibia/Fibula Left  Result Date: 02/14/2019 CLINICAL DATA:  Altered behavior, low back pain, pelvic pain, distal LEFT lower leg laceration, fell at home today, history asthma, heart murmur, hypertension, pneumonia, spinal surgery and scoliosis, former smoker, initial encounter EXAM: LEFT TIBIA AND FIBULA - 2 VIEW COMPARISON:  None FINDINGS: Osseous demineralization. Knee and ankle joint alignments normal. Chondrocalcinosis LEFT knee question CPPD. No acute fracture, dislocation, or bone destruction. Dorsal hardware at mid LEFT foot question TMT fusion. IMPRESSION: No acute osseous abnormalities. Electronically Signed   By: Lavonia Dana M.D.   On: 02/14/2019 20:58   CT Head  Result Date: 02/14/2019 CLINICAL DATA:  Fall, head trauma EXAM: CT HEAD  WITHOUT CONTRAST TECHNIQUE: Contiguous axial images were obtained from the base of the skull through the vertex without intravenous contrast. COMPARISON:  September 28, 2018 FINDINGS: Brain: No evidence of acute territorial infarction, hemorrhage, hydrocephalus,extra-axial collection or mass lesion/mass effect. Prior left parietooccipital area encephalomalacia. There is dilatation the ventricles and sulci consistent with age-related atrophy. Low-attenuation changes in the deep white matter consistent with small vessel ischemia. Vascular: No hyperdense vessel or unexpected calcification. Skull: The skull is intact. No fracture or focal lesion identified. Sinuses/Orbits: The visualized paranasal sinuses and mastoid air cells are clear. The orbits and globes intact. Other: None IMPRESSION: No acute intracranial abnormality. Findings consistent with age related atrophy and chronic small vessel ischemia Prior left parietooccipital lobe infarct. Electronically Signed   By: Prudencio Pair M.D.   On: 02/14/2019 20:10   US RENAL  Result Date: 02/14/2019 CLINICAL DATA:  78 year old female with acute  renal insufficiency. EXAM: RENAL / URINARY TRACT ULTRASOUND COMPLETE COMPARISON:  Renal ultrasound dated 03/18/2016. FINDINGS: Right Kidney: Renal measurements: 8.6 x 3.9 x 3.6 cm = volume: 63 mL. Increased renal echogenicity. No hydronephrosis or shadowing stone. Left Kidney: Renal measurements: 8.0 x 3.6 x 3.1 cm = volume: 46 mL. Increased renal echogenicity. No hydronephrosis or shadowing stone. A 1 cm upper pole cyst again noted. Bladder: Appears normal for degree of bladder distention. Other: None. IMPRESSION: Increased renal echogenicity in keeping with chronic kidney disease. No hydronephrosis or shadowing stone. Electronically Signed   By: Anner Crete M.D.   On: 02/14/2019 23:58   DG CHEST PORT 1 VIEW  Result Date: 02/21/2019 CLINICAL DATA:  Cough and shortness of breath EXAM: PORTABLE CHEST 1 VIEW COMPARISON:   Three days ago FINDINGS: Low volume chest with hazy and streaky density at the bases. No effusion or pneumothorax. Normal heart size when accounting for rotation. 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 Spinal fixation with scoliosis. IMPRESSION: Stable low volume chest with infiltrate or atelectasis at the bases. Electronically Signed   By: Monte Fantasia M.D.   On: 02/21/2019 08:14   DG Chest Portable 1 View  Result Date: 02/18/2019 CLINICAL DATA:  Shortness of breath. EXAM: PORTABLE CHEST 1 VIEW COMPARISON:  02/14/2019 and 09/28/2018 FINDINGS: Heart size and pulmonary vascularity are normal. The lungs are clear except for some minimal atelectasis at the right lung base, unchanged. No acute bone abnormality. IMPRESSION: No acute abnormalities. Minimal atelectasis at the right base, unchanged. Electronically Signed   By: Lorriane Shire M.D.   On: 02/18/2019 21:09    Microbiology: Recent Results (from the past 240 hour(s))  SARS CORONAVIRUS 2 (TAT 6-24 HRS) Nasopharyngeal Nasopharyngeal Swab     Status: None   Collection Time: 02/14/19  9:18 PM   Specimen: Nasopharyngeal Swab  Result Value Ref Range Status   SARS Coronavirus 2 NEGATIVE NEGATIVE Final    Comment: (NOTE) SARS-CoV-2 target nucleic acids are NOT DETECTED. The SARS-CoV-2 RNA is generally detectable in upper and lower respiratory specimens during the acute phase of infection. Negative results do not preclude SARS-CoV-2 infection, do not rule out co-infections with other pathogens, and should not be used as the sole basis for treatment or other patient management decisions. Negative results must be combined with clinical observations, patient history, and epidemiological information. The expected result is Negative. Fact Sheet for Patients: SugarRoll.be Fact Sheet for Healthcare Providers: https://www.woods-mathews.com/ This test is not yet approved or cleared  by the [place] FDA and  has been authorized for detection and/or diagnosis of SARS-CoV-2 by FDA under an Emergency Use Authorization (EUA). This EUA will remain  in effect (meaning this test can be used) for the duration of the COVID-19 declaration under Section 56 4(b)(1) of the Act, 21 U.S.C. section 360bbb-3(b)(1), unless the authorization is terminated or revoked sooner. Performed at [facility], [place] [address] [place], [place] [address]   Culture, Urine     Status: Abnormal   Collection Time: 02/15/19 12:40 AM   Specimen: Urine, Random  Result Value Ref Range Status   Specimen Description URINE, RANDOM  Final   Special Requests   Final    NONE Performed at [facility], [place] [address], [place], [place] [address]    Culture >=100,000 COLONIES/mL KLEBSIELLA PNEUMONIAE (A)  Final   Report Status 02/17/2019 FINAL  Final   Organism ID, Bacteria KLEBSIELLA PNEUMONIAE (A)  Final  Susceptibility   Klebsiella pneumoniae - MIC*    AMPICILLIN >=32 RESISTANT Resistant     CEFAZOLIN <=4 SENSITIVE Sensitive     CEFTRIAXONE <=1 SENSITIVE Sensitive     CIPROFLOXACIN <=0.25 SENSITIVE Sensitive     GENTAMICIN <=1 SENSITIVE Sensitive     IMIPENEM <=0.25 SENSITIVE Sensitive     NITROFURANTOIN 64 INTERMEDIATE Intermediate     TRIMETH/SULFA <=20 SENSITIVE Sensitive     AMPICILLIN/SULBACTAM 4 SENSITIVE Sensitive     PIP/TAZO <=4 SENSITIVE Sensitive     * >=100,000 COLONIES/mL KLEBSIELLA PNEUMONIAE  MRSA PCR Screening     Status: None   Collection Time: 02/15/19  4:00 AM   Specimen: Nasal Mucosa; Nasopharyngeal  Result Value Ref Range Status   MRSA by PCR NEGATIVE NEGATIVE Final    Comment:        The GeneXpert MRSA Assay (FDA approved for NASAL specimens only), is one component of a comprehensive MRSA colonization surveillance program. It is not intended to diagnose MRSA infection nor to guide or monitor treatment for MRSA infections. Performed at [facility], [place] [address], [place], [place] [address]   Blood Culture (routine x 2)     Status: None (Preliminary result)   Collection Time: 02/18/19  7:56 PM   Specimen: BLOOD RIGHT HAND  Result Value Ref Range Status   Specimen Description BLOOD RIGHT HAND  Final   Special Requests   Final    BOTTLES DRAWN AEROBIC AND ANAEROBIC Blood Culture results may not be optimal due to an inadequate volume of blood received in culture bottles   Culture   Final    NO GROWTH 4 DAYS Performed at [facility], [place] [address], [place], [place] [address]    Report Status PENDING  Incomplete  Blood Culture (routine x 2)     Status: None (Preliminary result)   Collection Time: 02/18/19  7:56 PM   Specimen: BLOOD LEFT FOREARM  Result Value Ref Range Status   Specimen Description BLOOD LEFT FOREARM  Final   Special Requests   Final    BOTTLES DRAWN AEROBIC AND ANAEROBIC Blood Culture adequate volume   Culture   Final    NO GROWTH 4 DAYS Performed at [facility], [place] [address] [address] [place], [place] [address]    Report Status PENDING  Incomplete  Respiratory Panel by RT PCR (Flu A&B, Covid) - Nasopharyngeal Swab     Status: None   Collection Time: 02/18/19  9:21 PM   Specimen: Nasopharyngeal Swab  Result Value Ref Range Status   SARS Coronavirus 2 by RT PCR NEGATIVE NEGATIVE Final    Comment: (NOTE) SARS-CoV-2 target nucleic acids are NOT DETECTED. The SARS-CoV-2 RNA is generally detectable in upper respiratoy specimens during the acute phase of infection. The lowest concentration of SARS-CoV-2 viral copies this assay can detect is 131 copies/mL. A negative result does not preclude SARS-Cov-2 infection and should not be used as the sole basis for treatment or other patient management decisions. A negative result may occur with  improper specimen collection/handling, submission of specimen other than nasopharyngeal swab, presence of viral mutation(s) within the areas targeted by this  assay, and inadequate number of viral copies (<131 copies/mL). A negative result must be combined with clinical observations, patient history, and epidemiological information. The expected result is Negative. Fact Sheet for Patients:  PinkCheek.be Fact Sheet for Healthcare Providers:  GravelBags.it This test is not yet ap proved or cleared by the [place] and  has been authorized for detection and/or diagnosis of SARS-CoV-2 by FDA under an Emergency Use Authorization (EUA). This EUA will remain  in effect (meaning this test can be used) for the duration of the COVID-19 declaration under Section 564(b)(1) of the Act, 21 U.S.C. section 360bbb-3(b)(1), unless the authorization is terminated or revoked sooner.    Influenza A by PCR NEGATIVE NEGATIVE Final   Influenza B by PCR NEGATIVE NEGATIVE Final    Comment: (NOTE) The Xpert Xpress SARS-CoV-2/FLU/RSV assay is intended as an aid in  the diagnosis of influenza from Nasopharyngeal swab specimens and  should not be used as a sole basis for treatment. Nasal washings and  aspirates are unacceptable for Xpert Xpress SARS-CoV-2/FLU/RSV  testing. Fact Sheet for Patients: PinkCheek.be Fact Sheet for Healthcare Providers: GravelBags.it This test is not yet approved or cleared by the [place] FDA and  has been authorized for detection and/or diagnosis of SARS-CoV-2 by  FDA under an Emergency Use Authorization (EUA). This EUA will remain  in effect (meaning this test can be used) for the duration of the  Covid-19 declaration under Section 564(b)(1) of the Act, 21  U.S.C. section 360bbb-3(b)(1), unless the authorization is  terminated or revoked. Performed at [facility], [place] [address] [place], [place] [address]   Culture, Urine     Status: None   Collection Time: 02/19/19  5:49 AM   Specimen: Urine,  Catheterized  Result Value Ref Range Status   Specimen Description URINE, CATHETERIZED  Final   Special Requests NONE  Final   Culture   Final    NO GROWTH Performed at [facility], [address] [place], [place] [address]    Report Status 02/20/2019 FINAL  Final  SARS CORONAVIRUS 2 (TAT 6-24 HRS) Nasopharyngeal Nasopharyngeal Swab     Status: None   Collection Time: 02/22/19  7:05 AM   Specimen: Nasopharyngeal Swab  Result Value Ref Range Status   SARS Coronavirus 2 NEGATIVE NEGATIVE Final    Comment: (NOTE) SARS-CoV-2 target nucleic acids are NOT DETECTED. The SARS-CoV-2 RNA is generally detectable in upper and lower respiratory specimens during the acute phase of infection. Negative results do not preclude SARS-CoV-2 infection, do not rule out co-infections with other pathogens, and should not be used as the sole basis for treatment or other patient management decisions. Negative results must be combined with clinical observations, patient history, and epidemiological information. The expected result is Negative. Fact Sheet for Patients: SugarRoll.be Fact Sheet for Healthcare Providers: https://www.woods-mathews.com/ This test is not yet approved or cleared by the [place] FDA and  has been authorized for detection and/or diagnosis of SARS-CoV-2 by FDA under an Emergency Use Authorization (EUA). This EUA will remain  in effect (meaning this test can be used) for the duration of the COVID-19 declaration under Section 56 4(b)(1) of the Act, 21 U.S.C. section 360bbb-3(b)(1), unless the authorization is terminated or revoked sooner. Performed at [facility], [place] [address], [place], [place] [address]      Labs: Basic Metabolic Panel: Recent Labs  Lab 02/16/19 0340 02/18/19 1959 02/19/19 0255 02/20/19 0430 02/21/19 0507 02/22/19 0449  NA 141 132* 135 139 138 138  K 3.8 5.0 4.6 4.3 4.3 4.4  CL 108 100 103  109 108 106  CO2 22 21* 20* 20* 22 22  GLUCOSE 84 164* 164* 109* 89 109*  BUN 26* 33* 32* 32* 24* 23  CREATININE 1.58* 2.13* 2.23* 1.47* 1.21* 1.28*  CALCIUM  8.7* 9.0 9.1 8.7* 8.7* 8.5*  MG 1.9  --   --   --   --   --    Liver Function Tests: Recent Labs  Lab 02/18/19 1959  AST 25  ALT 15  ALKPHOS 67  BILITOT 0.6  PROT 6.3*  ALBUMIN 3.5   No results for input(s): LIPASE, AMYLASE in the last 168 hours. Recent Labs  Lab 02/15/19 1637  AMMONIA 21   CBC: Recent Labs  Lab 02/16/19 0340 02/17/19 0423 02/18/19 1959 02/19/19 0255 02/19/19 0805 02/21/19 0507  WBC 6.9 7.9 12.2* 11.4*  --  8.9  NEUTROABS  --   --  9.8*  --   --  6.0  HGB 9.6* 10.2* 9.5* 9.1*  --  9.2*  HCT 30.9* 33.0* 31.6* 30.1* 27.2* 29.8*  MCV 93.4 95.1 96.6 96.2  --  93.7  PLT 579* 644* 547* 483*  --  655*   Cardiac Enzymes: No results for input(s): CKTOTAL, CKMB, CKMBINDEX, TROPONINI in the last 168 hours. BNP: BNP (last 3 results) Recent Labs    09/28/18 0938 02/18/19 1956 02/21/19 0507  BNP 249.8* 258.6* 673.1*    ProBNP (last 3 results) No results for input(s): PROBNP in the last 8760 hours.  CBG: Recent Labs  Lab 02/19/19 0625 02/20/19 0615  GLUCAP 181* 106*       Signed:  Kayleen Memos, MD [facility] 02/22/2019, 1:45 PM

## 2019-02-22 NOTE — Care Management Important Message (Signed)
Important Message  Patient Details  Name: Shirley [name] MRN: [id number] Date of Birth: December 22, 1940   Medicare Important Message Given:  Yes     Shelda Altes 02/22/2019, 2:33 PM

## 2019-02-22 NOTE — Progress Notes (Signed)
Patient alert and oriented, iv and tele removed. D/c instruction  And prescribtion explain and given to the patient, all questions answered, patient verbalized understanding.

## 2019-02-23 LAB — CULTURE, BLOOD (ROUTINE X 2)
Culture: NO GROWTH
Culture: NO GROWTH
Special Requests: ADEQUATE

## 2019-03-04 ENCOUNTER — Emergency Department (HOSPITAL_COMMUNITY): Payer: Medicare (Managed Care)

## 2019-03-04 ENCOUNTER — Encounter (HOSPITAL_COMMUNITY): Payer: Self-pay | Admitting: Emergency Medicine

## 2019-03-04 ENCOUNTER — Other Ambulatory Visit: Payer: Self-pay

## 2019-03-04 ENCOUNTER — Inpatient Hospital Stay (HOSPITAL_COMMUNITY)
Admission: EM | Admit: 2019-03-04 | Discharge: 2019-03-26 | DRG: 951 | Disposition: E | Payer: Medicare (Managed Care) | Attending: Internal Medicine | Admitting: Internal Medicine

## 2019-03-04 DIAGNOSIS — J9600 Acute respiratory failure, unspecified whether with hypoxia or hypercapnia: Secondary | ICD-10-CM | POA: Diagnosis present

## 2019-03-04 DIAGNOSIS — N1832 Chronic kidney disease, stage 3b: Secondary | ICD-10-CM | POA: Diagnosis present

## 2019-03-04 DIAGNOSIS — Z20822 Contact with and (suspected) exposure to covid-19: Secondary | ICD-10-CM | POA: Diagnosis present

## 2019-03-04 DIAGNOSIS — Z515 Encounter for palliative care: Principal | ICD-10-CM

## 2019-03-04 DIAGNOSIS — R4182 Altered mental status, unspecified: Secondary | ICD-10-CM

## 2019-03-04 DIAGNOSIS — Z66 Do not resuscitate: Secondary | ICD-10-CM | POA: Diagnosis present

## 2019-03-04 DIAGNOSIS — G9341 Metabolic encephalopathy: Secondary | ICD-10-CM | POA: Diagnosis present

## 2019-03-04 DIAGNOSIS — M545 Low back pain: Secondary | ICD-10-CM | POA: Diagnosis present

## 2019-03-04 DIAGNOSIS — Z7189 Other specified counseling: Secondary | ICD-10-CM

## 2019-03-04 DIAGNOSIS — M797 Fibromyalgia: Secondary | ICD-10-CM | POA: Diagnosis present

## 2019-03-04 DIAGNOSIS — R569 Unspecified convulsions: Secondary | ICD-10-CM | POA: Diagnosis present

## 2019-03-04 DIAGNOSIS — I5032 Chronic diastolic (congestive) heart failure: Secondary | ICD-10-CM | POA: Diagnosis present

## 2019-03-04 DIAGNOSIS — D473 Essential (hemorrhagic) thrombocythemia: Secondary | ICD-10-CM

## 2019-03-04 DIAGNOSIS — G894 Chronic pain syndrome: Secondary | ICD-10-CM | POA: Diagnosis present

## 2019-03-04 DIAGNOSIS — K219 Gastro-esophageal reflux disease without esophagitis: Secondary | ICD-10-CM | POA: Diagnosis present

## 2019-03-04 DIAGNOSIS — N179 Acute kidney failure, unspecified: Secondary | ICD-10-CM | POA: Diagnosis present

## 2019-03-04 DIAGNOSIS — D72829 Elevated white blood cell count, unspecified: Secondary | ICD-10-CM

## 2019-03-04 DIAGNOSIS — J45909 Unspecified asthma, uncomplicated: Secondary | ICD-10-CM | POA: Diagnosis present

## 2019-03-04 DIAGNOSIS — D75839 Thrombocytosis, unspecified: Secondary | ICD-10-CM | POA: Diagnosis present

## 2019-03-04 DIAGNOSIS — J189 Pneumonia, unspecified organism: Secondary | ICD-10-CM | POA: Diagnosis present

## 2019-03-04 DIAGNOSIS — Z96612 Presence of left artificial shoulder joint: Secondary | ICD-10-CM | POA: Diagnosis present

## 2019-03-04 DIAGNOSIS — Z96651 Presence of right artificial knee joint: Secondary | ICD-10-CM | POA: Diagnosis present

## 2019-03-04 DIAGNOSIS — Z981 Arthrodesis status: Secondary | ICD-10-CM

## 2019-03-04 DIAGNOSIS — R1314 Dysphagia, pharyngoesophageal phase: Secondary | ICD-10-CM | POA: Diagnosis present

## 2019-03-04 DIAGNOSIS — R6510 Systemic inflammatory response syndrome (SIRS) of non-infectious origin without acute organ dysfunction: Secondary | ICD-10-CM | POA: Diagnosis not present

## 2019-03-04 DIAGNOSIS — R011 Cardiac murmur, unspecified: Secondary | ICD-10-CM | POA: Diagnosis present

## 2019-03-04 DIAGNOSIS — D649 Anemia, unspecified: Secondary | ICD-10-CM

## 2019-03-04 DIAGNOSIS — Z823 Family history of stroke: Secondary | ICD-10-CM | POA: Diagnosis not present

## 2019-03-04 DIAGNOSIS — Z87891 Personal history of nicotine dependence: Secondary | ICD-10-CM

## 2019-03-04 DIAGNOSIS — G629 Polyneuropathy, unspecified: Secondary | ICD-10-CM | POA: Diagnosis present

## 2019-03-04 DIAGNOSIS — Z96642 Presence of left artificial hip joint: Secondary | ICD-10-CM | POA: Diagnosis present

## 2019-03-04 DIAGNOSIS — Z803 Family history of malignant neoplasm of breast: Secondary | ICD-10-CM

## 2019-03-04 DIAGNOSIS — Z8249 Family history of ischemic heart disease and other diseases of the circulatory system: Secondary | ICD-10-CM

## 2019-03-04 DIAGNOSIS — Z801 Family history of malignant neoplasm of trachea, bronchus and lung: Secondary | ICD-10-CM

## 2019-03-04 DIAGNOSIS — N189 Chronic kidney disease, unspecified: Secondary | ICD-10-CM | POA: Diagnosis present

## 2019-03-04 DIAGNOSIS — N170 Acute kidney failure with tubular necrosis: Secondary | ICD-10-CM | POA: Diagnosis not present

## 2019-03-04 DIAGNOSIS — D631 Anemia in chronic kidney disease: Secondary | ICD-10-CM | POA: Diagnosis present

## 2019-03-04 DIAGNOSIS — Z79899 Other long term (current) drug therapy: Secondary | ICD-10-CM

## 2019-03-04 DIAGNOSIS — N183 Chronic kidney disease, stage 3 unspecified: Secondary | ICD-10-CM

## 2019-03-04 HISTORY — DX: Heart failure, unspecified: I50.9

## 2019-03-04 LAB — CBC WITH DIFFERENTIAL/PLATELET
Abs Immature Granulocytes: 0.07 10*3/uL (ref 0.00–0.07)
Basophils Absolute: 0.1 10*3/uL (ref 0.0–0.1)
Basophils Relative: 1 %
Eosinophils Absolute: 0.1 10*3/uL (ref 0.0–0.5)
Eosinophils Relative: 1 %
HCT: 36.9 % (ref 36.0–46.0)
Hemoglobin: 10.8 g/dL — ABNORMAL LOW (ref 12.0–15.0)
Immature Granulocytes: 1 %
Lymphocytes Relative: 5 %
Lymphs Abs: 0.8 10*3/uL (ref 0.7–4.0)
MCH: 28.8 pg (ref 26.0–34.0)
MCHC: 29.3 g/dL — ABNORMAL LOW (ref 30.0–36.0)
MCV: 98.4 fL (ref 80.0–100.0)
Monocytes Absolute: 0.9 10*3/uL (ref 0.1–1.0)
Monocytes Relative: 6 %
Neutro Abs: 13 10*3/uL — ABNORMAL HIGH (ref 1.7–7.7)
Neutrophils Relative %: 86 %
Platelets: 1011 10*3/uL (ref 150–400)
RBC: 3.75 MIL/uL — ABNORMAL LOW (ref 3.87–5.11)
RDW: 14.6 % (ref 11.5–15.5)
WBC: 14.9 10*3/uL — ABNORMAL HIGH (ref 4.0–10.5)
nRBC: 0 % (ref 0.0–0.2)

## 2019-03-04 LAB — COMPREHENSIVE METABOLIC PANEL
ALT: 15 U/L (ref 0–44)
AST: 26 U/L (ref 15–41)
Albumin: 3.7 g/dL (ref 3.5–5.0)
Alkaline Phosphatase: 81 U/L (ref 38–126)
Anion gap: 14 (ref 5–15)
BUN: 26 mg/dL — ABNORMAL HIGH (ref 8–23)
CO2: 21 mmol/L — ABNORMAL LOW (ref 22–32)
Calcium: 8.9 mg/dL (ref 8.9–10.3)
Chloride: 103 mmol/L (ref 98–111)
Creatinine, Ser: 3.02 mg/dL — ABNORMAL HIGH (ref 0.44–1.00)
GFR calc Af Amer: 16 mL/min — ABNORMAL LOW (ref >60)
GFR calc non Af Amer: 14 mL/min — ABNORMAL LOW (ref >60)
Glucose, Bld: 163 mg/dL — ABNORMAL HIGH (ref 70–99)
Potassium: 4.8 mmol/L (ref 3.5–5.1)
Sodium: 138 mmol/L (ref 135–145)
Total Bilirubin: 0.6 mg/dL (ref 0.3–1.2)
Total Protein: 6.8 g/dL (ref 6.5–8.1)

## 2019-03-04 LAB — URINALYSIS, ROUTINE W REFLEX MICROSCOPIC
Bilirubin Urine: NEGATIVE
Glucose, UA: NEGATIVE mg/dL
Hgb urine dipstick: NEGATIVE
Ketones, ur: NEGATIVE mg/dL
Leukocytes,Ua: NEGATIVE
Nitrite: NEGATIVE
Protein, ur: NEGATIVE mg/dL
Specific Gravity, Urine: 1.012 (ref 1.005–1.030)
pH: 6 (ref 5.0–8.0)

## 2019-03-04 LAB — TROPONIN I (HIGH SENSITIVITY): Troponin I (High Sensitivity): 30 ng/L — ABNORMAL HIGH (ref <18)

## 2019-03-04 LAB — CBG MONITORING, ED: Glucose-Capillary: 153 mg/dL — ABNORMAL HIGH (ref 70–99)

## 2019-03-04 LAB — LACTIC ACID, PLASMA: Lactic Acid, Venous: 1.9 mmol/L (ref 0.5–1.9)

## 2019-03-04 LAB — BRAIN NATRIURETIC PEPTIDE: B Natriuretic Peptide: 221.8 pg/mL — ABNORMAL HIGH (ref 0.0–100.0)

## 2019-03-04 LAB — SARS CORONAVIRUS 2 (TAT 6-24 HRS): SARS Coronavirus 2: NEGATIVE

## 2019-03-04 MED ORDER — ACETAMINOPHEN 325 MG PO TABS: 650.0000 mg | ORAL_TABLET | Freq: Four times a day (QID) | ORAL | Status: DC | PRN

## 2019-03-04 MED ORDER — ACETAMINOPHEN 650 MG RE SUPP: 650.0000 mg | Freq: Four times a day (QID) | RECTAL | Status: DC | PRN

## 2019-03-04 MED ORDER — MORPHINE SULFATE (PF) 2 MG/ML IV SOLN: 2.0000 mg | INTRAVENOUS | Status: DC | PRN

## 2019-03-04 MED ORDER — HYDROMORPHONE HCL 1 MG/ML IJ SOLN: 0.5000 mg | INTRAMUSCULAR | Status: DC | PRN

## 2019-03-04 MED ORDER — HALOPERIDOL 0.5 MG PO TABS
0.5000 mg | ORAL_TABLET | ORAL | Status: DC | PRN
  Filled 2019-03-04: qty 1

## 2019-03-04 MED ORDER — DIPHENHYDRAMINE HCL 50 MG/ML IJ SOLN: 12.5000 mg | INTRAMUSCULAR | Status: DC | PRN

## 2019-03-04 MED ORDER — LORAZEPAM 2 MG/ML IJ SOLN
0.5000 mg | Freq: Two times a day (BID) | INTRAMUSCULAR | Status: DC
  Administered 2019-03-04 – 2019-03-05 (×2): 0.5 mg via INTRAVENOUS
  Filled 2019-03-04 (×2): qty 1

## 2019-03-04 MED ORDER — SODIUM CHLORIDE 0.9 % IV SOLN
3.0000 mg/h | INTRAVENOUS | Status: DC
  Administered 2019-03-04 – 2019-03-05 (×3): 2 mg/h via INTRAVENOUS
  Administered 2019-03-05: 20:00:00 3 mg/h via INTRAVENOUS
  Filled 2019-03-04 (×6): qty 2.5

## 2019-03-04 MED ORDER — LORAZEPAM 2 MG/ML IJ SOLN
1.0000 mg | INTRAMUSCULAR | Status: AC
  Administered 2019-03-04: 16:00:00 1 mg via INTRAVENOUS
  Filled 2019-03-04: qty 1

## 2019-03-04 MED ORDER — HALOPERIDOL LACTATE 2 MG/ML PO CONC
0.5000 mg | ORAL | Status: DC | PRN
  Filled 2019-03-04: qty 0.3

## 2019-03-04 MED ORDER — HALOPERIDOL LACTATE 5 MG/ML IJ SOLN
0.5000 mg | INTRAMUSCULAR | Status: DC | PRN
  Administered 2019-03-04 – 2019-03-05 (×2): 0.5 mg via INTRAVENOUS
  Filled 2019-03-04 (×2): qty 1

## 2019-03-04 MED ORDER — HYDROMORPHONE BOLUS VIA INFUSION
1.0000 mg | INTRAVENOUS | Status: DC | PRN
  Administered 2019-03-04 – 2019-03-05 (×9): 1 mg via INTRAVENOUS
  Filled 2019-03-04: qty 1

## 2019-03-04 MED ORDER — SCOPOLAMINE 1 MG/3DAYS TD PT72
1.0000 | MEDICATED_PATCH | TRANSDERMAL | Status: DC | PRN
  Administered 2019-03-05: 04:00:00 1.5 mg via TRANSDERMAL
  Filled 2019-03-04: qty 1

## 2019-03-04 MED ORDER — LORAZEPAM 2 MG/ML IJ SOLN
1.0000 mg | INTRAMUSCULAR | Status: DC | PRN
  Administered 2019-03-04 – 2019-03-05 (×3): 1 mg via INTRAVENOUS
  Filled 2019-03-04 (×2): qty 1

## 2019-03-04 MED ORDER — ONDANSETRON HCL 4 MG/2ML IJ SOLN: 4.0000 mg | Freq: Four times a day (QID) | INTRAMUSCULAR | Status: DC | PRN

## 2019-03-04 MED ORDER — MORPHINE SULFATE (PF) 2 MG/ML IV SOLN
2.0000 mg | INTRAVENOUS | Status: DC | PRN
  Administered 2019-03-04 (×2): 2 mg via INTRAVENOUS

## 2019-03-04 MED ORDER — MORPHINE 100MG IN NS 100ML (1MG/ML) PREMIX INFUSION
3.0000 mg/h | INTRAVENOUS | Status: DC
  Administered 2019-03-04: 13:00:00 1 mg/h via INTRAVENOUS
  Filled 2019-03-04: qty 100

## 2019-03-04 NOTE — Progress Notes (Signed)
   03/14/2019 1130  Clinical Encounter Type  Visited With Patient and family together  Visit Type ED;Patient actively dying  Referral From Nurse  Consult/Referral To [facility];Grief support   Chaplain responded for page of family support needed due to pt at end of life. Chaplain had conversation with the family and offered ministry of presence. Chaplain remains available for support as needs arise.   Chaplain Resident, Evelene Croon, [place] [phone] on-call

## 2019-03-04 NOTE — Consult Note (Addendum)
Consultation Note Date: 03/19/2019   Patient Name: Shirley [name]  DOB: 09-Sep-1940  MRN: [id number]  Age / Sex: 79 y.o., female  PCP: Sheral Apley Referring Physician: Norval Morton, MD  Reason for Consultation: Non pain symptom management, Pain control, Psychosocial/spiritual support and Terminal Care  HPI/Patient Profile: 79 y.o. female  with past medical history of chronic pain, many orthopedic surgeries, DVT, falls, CKD 3, CHF, asthma and esophageal dysphagia who was admitted on 03/04/2019 for end of life comfort care.  She had been found down after nausea and vomiting.  Labs indicate that her kidney have failed and her platelets are over 1000.   PMT was contacted by the attending for severe symptoms.  Clinical Assessment and Goals of Care:  I have reviewed medical records including EPIC notes, labs and imaging, received report from [place], assessed the patient and spoke with her family members at bedside to discuss diagnosis prognosis and EOL wishes.  I introduced Palliative Medicine as specialized medical care for people living with serious illness. It focuses on providing relief from the symptoms and stress of a serious illness.   We discussed a brief life review of the patient.  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  She supported many charities and was a generous caring person.  She is of the [employer].  The patient is actively dying on my exam.  She has pallor and her breaths are agonal.  Heart rate is slowed.  She is showing no signs of distress.  Husband at bedside is grieving.  States he did not expect this.  Even though she was just released from the hospital recently she had a good day yesterday - eating chick fil a and enjoying family.  Husband requests Chaplain support.  Support offered. Questions and concerns were addressed.  The  family was encouraged to call me with questions or concerns.   Primary Decision Maker:  NEXT OF KIN    SUMMARY OF RECOMMENDATIONS    Will change from morphine to dilaudid given renal failure.  Morphine has a propensity to cause agitation and delirium at EOL in renal failure.  Will adjust dose.  Patient was on oxy 30 mg q 4 PRN at home for chronic pain.  Given her inability to swallow her psychiatric meds I will schedule bid ativan IV.  Chaplain paged.  Discussed with Percell Locus, bedside RN.  Code Status/Advance Care Planning:  DNR   Symptom Management:   As above  Additional Recommendations (Limitations, Scope, Preferences):  Full Comfort Care  Palliative Prophylaxis:   Frequent Pain Assessment  Psycho-social/Spiritual:   Desire for further Chaplaincy support: requested.  Prognosis:  hours    Discharge Planning: Anticipated Hospital Death      Primary Diagnoses: Present on Admission: . Acute renal failure superimposed on chronic kidney disease ([place]) . Chronic diastolic CHF (congestive heart failure) ([place]) . Leukocytosis . Thrombocytosis ([place]) . SIRS (systemic inflammatory response syndrome) ([facility])   I have reviewed  the medical record, interviewed the patient and family, and examined the patient. The following aspects are pertinent.  Past Medical History:  Diagnosis Date  . Allergy    vasomotor rhinitis  . Anemia   . Asthma    h/o asthma and bronchitis  . BV (bacterial vaginosis)    history of frequent  . Cervical spondylosis    herniated disk protruding @ C6-7, impinging cord and left axillary root sleeve.  . CHF (congestive heart failure) ([place])   . Chronic pain   . Closed olecranon fracture, left, initial encounter   . Depression   . DVT of upper extremity (deep vein thrombosis) ([place])    h/o left(after wrist fracture/surgery); no residual thrombus or phlebitis by Dopplers July 2014  . Dyspnea    with exertion  . Edema   . Fibromyalgia    sees  Dr.Phillips-pain management  . GERD (gastroesophageal reflux disease)    h/o  . Headache    marigraine- hormal - none in years  . Heart murmur   . Hypertension    never has been treated that daughter is aware  . Neuropathy   . Neuropathy   . Osteopenia    mild at hip (T-1.3)  . PAT (paroxysmal atrial tachycardia) ([place])   . Pneumonia    years ago  . Recurrent falls 04/16/2016  . Varicose veins   . 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 Eilleen Kempf @ [facility]  . Vision problem    wears glasses  . Vitamin D deficiency    Social History   Socioeconomic History  . Marital status: Married    Spouse name: Florence  . Number of children: Not on file  . Years of education: Not on file  . Highest education level: Not on file  Occupational History  . Not on file  Tobacco Use  . Smoking status: Former Smoker    Quit date: 02/22/1958    Years since quitting: 61.0  . Smokeless tobacco: Never Used  . Tobacco comment: doesn't smoke every day  Substance and Sexual Activity  . Alcohol use: No    Alcohol/week: 0.0 standard drinks  . Drug use: No  . Sexual activity: Not on file  Other Topics Concern  . Not on file  Social History Narrative   Lives with husband.  3 children ([place], [place], [place]), 5 grandchildren.  Education trade school.  Retired.  Caffeine 2 x daily.     Social Determinants of Health   Financial Resource Strain:   . Difficulty of Paying Living Expenses: Not on file  Food Insecurity:   . Worried About Charity fundraiser in the Last Year: Not on file  . Ran Out of Food in the Last Year: Not on file  Transportation Needs:   . Lack of Transportation (Medical): Not on file  . Lack of Transportation (Non-Medical): Not on file  Physical Activity:   . Days of  Exercise per Week: Not on file  . Minutes of Exercise per Session: Not on file  Stress:   . Feeling of Stress : Not on file  Social Connections:   . Frequency of Communication with Friends and Family: Not on file  . Frequency of Social Gatherings with Friends and Family: Not on file  . Attends Religious  Services: Not on file  . Active Member of Clubs or Organizations: Not on file  . Attends Archivist Meetings: Not on file  . Marital Status: Not on file   Family History  Problem Relation Age of Onset  . Stroke Mother   . Osteoarthritis Mother        Died at age 72  . Heart disease Father   . Lung cancer Father   . Heart failure Father        Died at age 52  . Osteoporosis Sister   . Diabetes Brother   . Heart attack Brother 54  . Diabetes Brother   . Diabetes Brother   . Heart attack Brother 64  . Breast cancer Paternal Aunt   . Heart failure Maternal Grandmother        Died at age 72, unsure of her heart disease  . Heart failure Paternal Grandfather        Died at age 62, unsure of cardiac disease.   Scheduled Meds: Continuous Infusions: . HYDROmorphone 2 mg/hr (02/27/2019 1621)   PRN Meds:.acetaminophen **OR** acetaminophen, diphenhydrAMINE, haloperidol **OR** haloperidol **OR** haloperidol lactate, HYDROmorphone, HYDROmorphone (DILAUDID) injection, LORazepam, ondansetron (ZOFRAN) IV, scopolamine Allergies  Allergen Reactions  . Buspirone Hcl Other (See Comments)    Headaches, twitching   Review of Systems unable to give  Physical Exam  Well developed female, unresponsive, actively dying. +pallor CV brady Resp agonal Abdomen soft  Vital Signs: BP (!) 86/53 (BP Location: Right Arm)   Pulse 71   Temp 98.1 F (36.7 C) (Oral)   Resp (!) 8   SpO2 100%  Pain Scale: PAINAD   Pain Score: 0-No pain   SpO2: SpO2: 100 % O2 Device:SpO2: 100 % O2 Flow Rate: .O2 Flow Rate (L/min): 2 L/min  IO: Intake/output summary: No intake or output data in the 24  hours ending 03/15/2019 1631  LBM:   Baseline Weight:   Most recent weight:       Palliative Assessment/Data: 10%     Time In: 4:00 Time Out: 4:42 Time Total: 42 min. Visit consisted of counseling and education dealing with the complex and emotionally intense issues surrounding the need for palliative care and symptom management in the setting of serious and potentially life-threatening illness. Greater than 50%  of this time was spent counseling and coordinating care related to the above assessment and plan.  Signed by: Florentina Jenny, PA-C Palliative Medicine Pager: [phone]  Please contact Palliative Medicine Team phone at [phone] for questions and concerns.  For individual provider: See Shea Evans

## 2019-03-04 NOTE — ED Notes (Signed)
Daughter is at bedside  

## 2019-03-04 NOTE — ED Triage Notes (Signed)
Pt here with AMS that started last night. Was here recently for UTI.

## 2019-03-04 NOTE — ED Notes (Signed)
Date and time results received: 02/26/2019 1143 (use smartphrase ".now" to insert current time) Test: platelets Critical Value: 1011  Name of Provider Notified: dykstra  Orders Received? Or Actions Taken?: .

## 2019-03-04 NOTE — Progress Notes (Signed)
Patient's family educated on the policy regarding visitation for palliative patients; 4 visitors are currently in the room after being informed that only 2 visitors may be present at the time; A 5th visitor arrived and this RN reinforced the visitation policy; Patient's relative made the statement "If you think that Trump stuff was bad, just wait until my family finds out only 4 people can come up here. They might tear the walls down." Charge Nurse Rakita made aware; AC Carlene made aware.

## 2019-03-04 NOTE — Progress Notes (Signed)
Patient experiencing labored breathing, tremors and has verbally expressed she is in pain; Shirley Julian, MD made aware; Verbal order to increase rate of morphine drip to 34ml/hr and give a 2nd bolus of morphine; Patient also given Ativan as ordered; Will continue to monitor this patient.

## 2019-03-04 NOTE — ED Notes (Signed)
Chaplain paged  

## 2019-03-04 NOTE — ED Provider Notes (Signed)
[facility] EMERGENCY DEPARTMENT Provider Note   CSN: [id number] Arrival date & time: 03/12/2019  6226     History Chief Complaint  Patient presents with  . Altered Mental Status    Shirley [name] is a 79 y.o. female.  Presents to ER with concern for altered mental status.  Recent admission, discharged on 1231 after she was found to have AKI, possible UTI though urine culture was negative.  AKI resolved with IV fluids but concern for volume overload and was given diuretics.  Mental status improved and had near return to normal.  Daughter reports since yesterday afternoon/evening has noted increased confusion, altered mental status.  States she never had complete return to her prior baseline after last hospitalization but seem to be doing better.  Denies any new falls.  No known rashes.  States mother has not complained of any chest pain or difficulty breathing or fever.  No new cough.  Patient has some confusion but answers basic questions, she specifically denies any chest pain, abdominal pain, vomiting, fever, chills.  HPI     Past Medical History:  Diagnosis Date  . Allergy    vasomotor rhinitis  . Anemia   . Asthma    h/o asthma and bronchitis  . BV (bacterial vaginosis)    history of frequent  . Cervical spondylosis    herniated disk protruding @ C6-7, impinging cord and left axillary root sleeve.  . CHF (congestive heart failure) ([place])   . Chronic pain   . Closed olecranon fracture, left, initial encounter   . Depression   . DVT of upper extremity (deep vein thrombosis) ([place])    h/o left(after wrist fracture/surgery); no residual thrombus or phlebitis by [facility] July 2014  . Dyspnea    with exertion  . Edema   . Fibromyalgia    sees Dr.Phillips-pain management  . GERD (gastroesophageal reflux disease)    h/o  . Headache    marigraine- hormal - none in years  . Heart murmur   . Hypertension    never has been treated that daughter is aware  .  Neuropathy   . Neuropathy   . Osteopenia    mild at hip (T-1.3)  . PAT (paroxysmal atrial tachycardia) ([place])   . Pneumonia    years ago  . Recurrent falls 04/16/2016  . Varicose veins   . 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 Eilleen Kempf @ [facility]  . Vision problem    wears glasses  . Vitamin D deficiency     Patient Active Problem List   Diagnosis Date Noted  . Comfort measures only status 02/23/2019  . SIRS (systemic inflammatory response syndrome) ([place]) 03/13/2019  . Acute respiratory failure with hypoxia ([place])   . Fracture of hardware in spine ([place]) 02/14/2019  . Leg laceration 02/14/2019  . Dehydration 09/30/2018  . CKD (chronic kidney disease), stage III 09/28/2018  . Stroke ([place]) 09/28/2018  . Depression with anxiety 09/28/2018  . Chronic diastolic CHF (congestive heart failure) ([place]) 09/28/2018  . Right sided weakness 09/28/2018  . Asthma   . Frequent falls   . 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 OA (osteoarthritis) of hip 08/18/2016  . Pre-operative cardiovascular examination 07/16/2016  . Recurrent falls 04/16/2016  . Chronic pain   .  Hypertension   . Neuropathy   . Osteopenia   . Vitamin D deficiency   . Olecranon fracture 04/15/2016  . Fall at home, initial encounter   . Fall   . Anemia   . Post-operative pain   . Fibromyalgia   . Uncomplicated asthma   . Tobacco abuse   . AKI (acute kidney injury) ([place])   . Leukocytosis   . Other secondary hypertension   . Hyperglycemia   . Acute blood loss anemia   . Thrombocytosis ([place])   . Closed fracture of left olecranon process   . Acute renal failure superimposed on chronic kidney disease ([place]) 03/18/2016  . Acute metabolic encephalopathy 69/48/5462  . Elbow fracture, left, closed, initial  encounter 03/18/2016  . Acute pain of left hip 12/23/2015  . Acute lower UTI 12/23/2015  . DNR (do not resuscitate) discussion 12/23/2015  . Palliative care by specialist 12/23/2015  . Chronic pain syndrome 12/23/2015  . Left hip pain 12/23/2015  . Effusion of left hip   . Lytic bone lesions on xray   . Exertional dyspnea 09/07/2012  . Varicose veins of both lower extremities with pain 08/27/2012  . Lower extremity edema 08/27/2012  . Abnormal resting ECG findings - sinus rhythm with PACs, and PAT 08/19/2012  . Aortic systolic murmur on examination 08/19/2012  . PAT (paroxysmal atrial tachycardia) / MAT 08/19/2012    Past Surgical History:  Procedure Laterality Date  . ABDOMINAL HYSTERECTOMY  1999   BSO for endometriosis  . APPENDECTOMY    . BACK SURGERY  age 30   ruptured disk L3-4   . CATARACT EXTRACTION  2000   B/L  . COLONOSCOPY    . FINGER SURGERY     left finger for tender rupture from fall.  Marland Kitchen FOOT SURGERY     multiple(at least 4 on right, 5 on left)-left Dr.Kerner([place])11/08,left Dr.Nunley-excision 2nd and 3rd mt heads w/artho and hammertoe surgery 4th and 5th 04/2006. left great toe IP fusion and revision of fusion of 2nd through 5th toes 12/2005. Right foot surgeries  Dr.Bednarz 04/2008 and 04/2009.  Marland Kitchen KNEE SURGERY Right    right knee replacement  . NECK SURGERY  2007   cervical  . ORIF ELBOW FRACTURE Left 03/22/2016   Procedure: OPEN REDUCTION INTERNAL FIXATION (ORIF) ELBOW/OLECRANON FRACTURE;  Surgeon: Altamese Banks, MD;  Location: [facility];  Service: Orthopedics;  Laterality: Left;  . ORIF ELBOW FRACTURE Left 04/15/2016   Procedure: OPEN REDUCTION INTERNAL FIXATION (ORIF) LEFT ELBOW/OLECRANON FRACTURE;  Surgeon: Altamese Topton, MD;  Location: [facility];  Service: Orthopedics;  Laterality: Left;  . RADIOLOGY WITH ANESTHESIA Left 01/06/2016   Procedure: MRI LEFT HIP WITH AND WITHOUT;  Surgeon: Medication Radiologist, MD;  Location: [facility];  Service: Radiology;  Laterality:  Left;  . REFRACTIVE SURGERY  2007   left eye  . REVERSE SHOULDER ARTHROPLASTY Left 12/31/2016   Procedure: REVERSE LEFT SHOULDER ARTHROPLASTY;  Surgeon: Netta Cedars, MD;  Location: [facility];  Service: Orthopedics;  Laterality: Left;  . SHOULDER SURGERY     x8 (4 on rt side and 4 on left side)  . SPINAL FUSION  6/09   Dr.Cohen  . STERIOD INJECTION Left 04/15/2016   Procedure: LEFT HIP STEROID INJECTION;  Surgeon: Altamese [place], MD;  Location: [facility];  Service: Orthopedics;  Laterality: Left;  . TONSILLECTOMY AND ADENOIDECTOMY    . TOTAL HIP ARTHROPLASTY Left 08/18/2016   Procedure: LEFT TOTAL HIP ARTHROPLASTY ANTERIOR APPROACH;  Surgeon: Gaynelle Arabian, MD;  Location: [facility];  Service: Orthopedics;  Laterality: Left;  . TRANSTHORACIC ECHOCARDIOGRAM  July 2012   Normal EF 60-65% , mild concentric LVH. Grade 2 diastolic dysfunction with elevated EDP. Massive left atrial dilation. Pulmonary pressures 30-40 mm Hg; aortic sclerosis but no stenosis. Moderate TR  . TRANSTHORACIC ECHOCARDIOGRAM  03/2016   EF 65-70%. Normal wall motion. GR 1 DD. Severe LA dilation. Mild to moderate TR with mild to moderately elevated PA pressures (44 mmHg) mild aortic calcification but no stenosis. No source of emboli   . WRIST SURGERY Left    2 surgeries left wrist after fracture(complicated by DVT)     OB History   No obstetric history on file.     Family History  Problem Relation Age of Onset  . Stroke Mother   . Osteoarthritis Mother        Died at age 20  . Heart disease Father   . Lung cancer Father   . Heart failure Father        Died at age 56  . Osteoporosis Sister   . Diabetes Brother   . Heart attack Brother 21  . Diabetes Brother   . Diabetes Brother   . Heart attack Brother 31  . Breast cancer Paternal Aunt   . Heart failure Maternal Grandmother        Died at age 58, unsure of her heart disease  . Heart failure Paternal Grandfather        Died at age 92, unsure of cardiac disease.     Social History   Tobacco Use  . Smoking status: Former Smoker    Quit date: 02/22/1958    Years since quitting: 61.0  . Smokeless tobacco: Never Used  . Tobacco comment: doesn't smoke every day  Substance Use Topics  . Alcohol use: No    Alcohol/week: 0.0 standard drinks  . Drug use: No    Home Medications Prior to Admission medications   Medication Sig Start Date End Date Taking? Authorizing Provider  acetaminophen (TYLENOL) 325 MG tablet Take 2 tablets (650 mg total) by mouth every 6 (six) hours as needed for mild pain, fever or headache (or Fever >/= 101). 09/30/18   Eugenie Filler, MD  amitriptyline (ELAVIL) 25 MG tablet Take 0.5 tablets (12.5 mg total) by mouth at bedtime. 02/22/19   Kayleen Memos, DO  aspirin EC 81 MG EC tablet Take 1 tablet (81 mg total) by mouth daily. 02/22/19   Kayleen Memos, DO  ferrous sulfate 325 (65 FE) MG tablet Take 1 tablet (325 mg total) by mouth daily with breakfast. 02/22/19   Kayleen Memos, DO  FLUoxetine (PROZAC) 40 MG capsule Take 1 capsule (40 mg total) by mouth daily. 02/22/19   Kayleen Memos, DO  gabapentin (NEURONTIN) 300 MG capsule Take 1 capsule (300 mg total) by mouth 2 (two) times daily. 02/22/19   Kayleen Memos, DO  lansoprazole (PREVACID) 30 MG capsule Take 30 mg by mouth daily. 01/15/19   [provider]  metoprolol tartrate (LOPRESSOR) 25 MG tablet Take 1 tablet (25 mg total) by mouth 2 (two) times daily. 02/22/19   Kayleen Memos, DO  oxyCODONE (OXY IR/ROXICODONE) 5 MG immediate release tablet Take 1 tablet (5 mg total) by mouth every 8 (eight) hours as needed for moderate pain. 02/22/19   Kayleen Memos, DO  rosuvastatin (CRESTOR) 5 MG tablet Take 5 mg by mouth daily. 05/18/18   [provider]  torsemide (DEMADEX) 10 MG  tablet Take 1 tablet (10 mg total) by mouth every other day as needed (for fluid). 02/17/19   Lavina Hamman, MD    Allergies    Buspirone hcl  Review of Systems   Review of Systems   Unable to perform ROS: Mental status change    Physical Exam Updated Vital Signs BP (!) 86/53 (BP Location: Right Arm)   Pulse 71   Temp 98.1 F (36.7 C) (Oral)   Resp (!) 8   SpO2 100%   Physical Exam Vitals and nursing note reviewed.  Constitutional:      General: She is not in acute distress.    Appearance: She is well-developed.     Comments: Pleasantly confused but no distress  HENT:     Head: Normocephalic and atraumatic.  Eyes:     Conjunctiva/sclera: Conjunctivae normal.  Cardiovascular:     Rate and Rhythm: Normal rate and regular rhythm.     Heart sounds: No murmur.  Pulmonary:     Effort: Pulmonary effort is normal. No respiratory distress.     Breath sounds: Normal breath sounds.  Abdominal:     Palpations: Abdomen is soft.     Tenderness: There is no abdominal tenderness.  Musculoskeletal:        General: No swelling or tenderness.     Cervical back: Neck supple.     Comments: Right lower leg anterior laceration healing well, no erythema, no induration  Skin:    General: Skin is warm and dry.  Neurological:     Mental Status: She is alert.     Comments: Alert, oriented to person, place but not time, strength and sensation intact in all 4 extremities     ED Results / Procedures / Treatments   Labs (all labs ordered are listed, but only abnormal results are displayed) Labs Reviewed  CBC WITH DIFFERENTIAL/PLATELET - Abnormal; Notable for the following components:      Result Value   WBC 14.9 (*)    RBC 3.75 (*)    Hemoglobin 10.8 (*)    MCHC 29.3 (*)    Platelets 1,011 (*)    Neutro Abs 13.0 (*)    All other components within normal limits  COMPREHENSIVE METABOLIC PANEL - Abnormal; Notable for the following components:   CO2 21 (*)    Glucose, Bld 163 (*)    BUN 26 (*)    Creatinine, Ser 3.02 (*)    GFR calc non Af Amer 14 (*)    GFR calc Af Amer 16 (*)    All other components within normal limits  BRAIN NATRIURETIC PEPTIDE - Abnormal;  Notable for the following components:   B Natriuretic Peptide 221.8 (*)    All other components within normal limits  CBG MONITORING, ED - Abnormal; Notable for the following components:   Glucose-Capillary 153 (*)    All other components within normal limits  TROPONIN I (HIGH SENSITIVITY) - Abnormal; Notable for the following components:   Troponin I (High Sensitivity) 30 (*)    All other components within normal limits  SARS CORONAVIRUS 2 (TAT 6-24 HRS)  URINALYSIS, ROUTINE W REFLEX MICROSCOPIC  LACTIC ACID, PLASMA  PATHOLOGIST SMEAR REVIEW    EKG EKG Interpretation  Date/Time:  Sunday March 04 2019 09:18:33 EST Ventricular Rate:  105 PR Interval:    QRS Duration: 87 QT Interval:  366 QTC Calculation: 484 R Axis:   60 Text Interpretation: Sinus tachycardia Consider right atrial enlargement Nonspecific T abnormalities, lateral leads Baseline  wander in lead(s) I II aVR Confirmed by Madalyn Rob [phone]) on 03/24/2019 9:24:42 AM   Radiology CT Head Wo Contrast  Result Date: 03/22/2019 CLINICAL DATA:  Encephalopathy, altered mental status EXAM: CT HEAD WITHOUT CONTRAST TECHNIQUE: Contiguous axial images were obtained from the base of the skull through the vertex without intravenous contrast. COMPARISON:  CT head dated 12/15/2018 FINDINGS: Brain: No evidence of acute infarction, hemorrhage, hydrocephalus, extra-axial collection or mass lesion/mass effect. Periventricular white matter hypoattenuation likely represents chronic small vessel ischemic disease. Encephalomalacia of the left parietal/occipital lobes is unchanged. Vascular: There are vascular calcifications in the carotid siphons. Skull: Normal. Negative for fracture or focal lesion. Sinuses/Orbits: None there is a bilateral sphenoid sinus disease. There is a trace right mastoid effusion. Other: None. IMPRESSION: 1. No acute intracranial process. Electronically Signed   By: Zerita Boers M.D.   On: 03/10/2019 09:57   DG  Chest Portable 1 View  Result Date: 03/24/2019 CLINICAL DATA:  Shortness of breath with altered mental status EXAM: PORTABLE CHEST 1 VIEW COMPARISON:  February 21, 2019 FINDINGS: There is slight bibasilar atelectasis. Lungs elsewhere are clear. Heart size and pulmonary vascularity are normal. No adenopathy. Aorta is tortuous but stable. There is a total shoulder replacement on the left. There is postoperative change in the right shoulder region with marked superior migration of the right humeral head and widening of the acromioclavicular joint. There is postoperative change in the lower thoracic and visualized upper lumbar regions. IMPRESSION: Mild bibasilar atelectasis. No edema or consolidation. Stable cardiac silhouette. No adenopathy. Areas of postoperative change. Advanced arthropathy in the right shoulder. Electronically Signed   By: Lowella Grip III M.D.   On: 03/03/2019 10:03    Procedures .Critical Care Performed by: Lucrezia Starch, MD Authorized by: Lucrezia Starch, MD   Critical care provider statement:    Critical care time (minutes):  45   Critical care was necessary to treat or prevent imminent or life-threatening deterioration of the following conditions:  CNS failure or compromise and shock   Critical care was time spent personally by me on the following activities:  Discussions with consultants, evaluation of patient's response to treatment, examination of patient, ordering and performing treatments and interventions, ordering and review of laboratory studies, ordering and review of radiographic studies, pulse oximetry, re-evaluation of patient's condition, obtaining history from patient or surrogate and review of old charts   (including critical care time)  Medications Ordered in ED Medications  LORazepam (ATIVAN) injection 1 mg (1 mg Intravenous Given 03/10/2019 1505)  ondansetron (ZOFRAN) injection 4 mg (has no administration in time range)  acetaminophen (TYLENOL)  tablet 650 mg (has no administration in time range)    Or  acetaminophen (TYLENOL) suppository 650 mg (has no administration in time range)  diphenhydrAMINE (BENADRYL) injection 12.5 mg (has no administration in time range)  haloperidol (HALDOL) tablet 0.5 mg ( Oral See Alternative 03/20/2019 1535)    Or  haloperidol (HALDOL) 2 MG/ML solution 0.5 mg ( Sublingual See Alternative 03/09/2019 1535)    Or  haloperidol lactate (HALDOL) injection 0.5 mg (0.5 mg Intravenous Given 03/15/2019 1535)  scopolamine (TRANSDERM-SCOP) 1 MG/3DAYS 1.5 mg (has no administration in time range)  HYDROmorphone (DILAUDID) 25 mg in sodium chloride 0.9 % 50 mL (0.5 mg/mL) infusion (has no administration in time range)  HYDROmorphone (DILAUDID) bolus via infusion 1 mg (has no administration in time range)  HYDROmorphone (DILAUDID) injection 0.5 mg (has no administration in time range)  LORazepam (ATIVAN) injection 1  mg (1 mg Intravenous Given 03/17/2019 1554)    ED Course  I have reviewed the triage vital signs and the nursing notes.  Pertinent labs & imaging results that were available during my care of the patient were reviewed by me and considered in my medical decision making (see chart for details).  Clinical Course as of Mar 03 1606  Sun Mar 04, 2019  1127 Called to bedisde, patient diaphoretic, lethargic, hypotensive; long discussion with daughter at bedside; had deceided DNR/DNI, after last hospitalization, patient had made clear she would not want extensive medical intervention, given circumstances, daughter would like to pursue comfort measure only at this time   [RD]  1204 D/w Tamala Julian who will accept   [RD]    Clinical Course User Index [RD] Lucrezia Starch, MD   MDM Rules/Calculators/A&P                      79 year old lady presents to ER with concern for altered mental status.  On initial assessment, noted to have some confusion but was not in any distress and had stable vital signs.  Daughter, POA,  Monica Codd, came to bedside, obtain additional history.  Multiple recent hospitalizations, general decline.  Patient abruptly became diaphoretic, hypotensive, lethargic.  Reviewed goals of care with daughter, given current circumstances, recent conversations with patient, want to focus on comfort measures only at this time. Updated staff, placed order for DNR.  Consulted hospitalist for admission.  Dr. Tamala Julian will admit.  Final Clinical Impression(s) / ED Diagnoses Final diagnoses:  Altered mental status, unspecified altered mental status type  Admission for palliative care    Rx / DC Orders ED Discharge Orders    None       Lucrezia Starch, MD 03/01/2019 1609

## 2019-03-04 NOTE — H&P (Addendum)
History and Physical    Shirley [name] CLE:[id number] DOB: [birth year] DOA: 03/21/2019  Referring MD/NP/PA: Madalyn Rob, MD PCP: Ardith Dark, PA-C  Patient coming from: Home via EMS  Chief Complaint: Altered  I have personally briefly reviewed patient's old medical records in [place]   HPI: Shirley [name] is a 79 y.o. female with medical history significant of asthma, CHF, PAT, neuropathy, fibromyalgia, chronic pain, GERD.  She presents with family after being found to be acutely altered nausea and vomiting this morning.  Patient had just been recently hospitalized from 12/27-12/30 for acute respiratory failure with hypoxia, AKI superimposed on chronic kidney disease, possible UTI, and pulmonary edema secondary to IV fluids resolved with IV diuretics.  At discharge patient went home with family arranging for in home health care.  Patient had never returned to baseline, but family notes that morning she was acutely confused this a.m.  They confirmed that the patient is to remain DNR and they want to just keep her comfortable at this time.  They do not want to proceed any aggressive treatment measures to sustain her life.   ED Course: Upon admission into the emergency department patient was noted to be afebrile, pulse 65-106, respirations 12-40, blood pressures as low as 61/38, and O2 saturations 92-99% on 2 L nasal cannula oxygen.  Labs significant for WBC 14.9, hemoglobin 10.8, platelets 1011, BUN 26, creatinine 3.02, BNP 221, and high-sensitivity troponin 30.  CT scan of the brain did not show any acute abnormalities.  Urinalysis was negative for any signs of infection.  Chest x-ray noted mild bibasilar atelectasis without notable edema or consolidation.  Patient was ordered morphine, Ativan, and Zofran if needed.  Plan to admit for comfort care measures only.  Review of Systems  Unable to perform ROS: Patient unresponsive    Past Medical History:  Diagnosis Date   Allergy      vasomotor rhinitis   Anemia    Asthma    h/o asthma and bronchitis   BV (bacterial vaginosis)    history of frequent   Cervical spondylosis    herniated disk protruding @ C6-7, impinging cord and left axillary root sleeve.   CHF (congestive heart failure) (HCC)    Chronic pain    Closed olecranon fracture, left, initial encounter    Depression    DVT of upper extremity (deep vein thrombosis) (HCC)    h/o left(after wrist fracture/surgery); no residual thrombus or phlebitis by Dopplers July 2014   Dyspnea    with exertion   Edema    Fibromyalgia    sees Dr.Phillips-pain management   GERD (gastroesophageal reflux disease)    h/o   Headache    marigraine- hormal - none in years   Heart murmur    Hypertension    never has been treated that daughter is aware   Neuropathy    Neuropathy    Osteopenia    mild at hip (T-1.3)   PAT (paroxysmal atrial tachycardia) ([place])    Pneumonia    years ago   Recurrent falls 04/16/2016   Varicose veins    Varicose veins of both legs with edema 08/2012   Also pain; bilateral GSV dilated with reflux, R Short SV dilated with reflux; not amenable to VNUS ablation due to tortuosity and superficial nature of veins -- recommeded referral to Dr. Eilleen Kempf @ [facility]   Vision problem    wears glasses   Vitamin D deficiency  Past Surgical History:  Procedure Laterality Date   ABDOMINAL HYSTERECTOMY  1999   BSO for endometriosis   APPENDECTOMY     BACK SURGERY  age 52   ruptured disk L3-4    CATARACT EXTRACTION  2000   B/L   COLONOSCOPY     FINGER SURGERY     left finger for tender rupture from fall.   FOOT SURGERY     multiple(at least 4 on right, 5 on left)-left Dr.Kerner([place])11/08,left Dr.Nunley-excision 2nd and 3rd mt heads w/artho and hammertoe surgery 4th and 5th 04/2006. left great toe IP fusion and revision of fusion of 2nd through 5th toes 12/2005. Right foot surgeries  Dr.Bednarz  04/2008 and 04/2009.   KNEE SURGERY Right    right knee replacement   NECK SURGERY  2007   cervical   ORIF ELBOW FRACTURE Left 03/22/2016   Procedure: OPEN REDUCTION INTERNAL FIXATION (ORIF) ELBOW/OLECRANON FRACTURE;  Surgeon: Altamese Livingston, MD;  Location: [place];  Service: Orthopedics;  Laterality: Left;   ORIF ELBOW FRACTURE Left 04/15/2016   Procedure: OPEN REDUCTION INTERNAL FIXATION (ORIF) LEFT ELBOW/OLECRANON FRACTURE;  Surgeon: Altamese [place], MD;  Location: [place];  Service: Orthopedics;  Laterality: Left;   RADIOLOGY WITH ANESTHESIA Left 01/06/2016   Procedure: MRI LEFT HIP WITH AND WITHOUT;  Surgeon: Medication Radiologist, MD;  Location: [place];  Service: Radiology;  Laterality: Left;   REFRACTIVE SURGERY  2007   left eye   REVERSE SHOULDER ARTHROPLASTY Left 12/31/2016   Procedure: REVERSE LEFT SHOULDER ARTHROPLASTY;  Surgeon: Netta Cedars, MD;  Location: [place];  Service: Orthopedics;  Laterality: Left;   SHOULDER SURGERY     x8 (4 on rt side and 4 on left side)   SPINAL FUSION  6/09   Dr.Cohen   STERIOD INJECTION Left 04/15/2016   Procedure: LEFT HIP STEROID INJECTION;  Surgeon: Altamese [place]aboa, MD;  Location: [place];  Service: Orthopedics;  Laterality: Left;   TONSILLECTOMY AND ADENOIDECTOMY     TOTAL HIP ARTHROPLASTY Left 08/18/2016   Procedure: LEFT TOTAL HIP ARTHROPLASTY ANTERIOR APPROACH;  Surgeon: Gaynelle Arabian, MD;  Location: [facility];  Service: Orthopedics;  Laterality: Left;   TRANSTHORACIC ECHOCARDIOGRAM  July 2012   Normal EF 60-65% , mild concentric LVH. Grade 2 diastolic dysfunction with elevated EDP. Massive left atrial dilation. Pulmonary pressures 30-40 mm Hg; aortic sclerosis but no stenosis. Moderate TR   TRANSTHORACIC ECHOCARDIOGRAM  03/2016   EF 65-70%. Normal wall motion. GR 1 DD. Severe LA dilation. Mild to moderate TR with mild to moderately elevated PA pressures (44 mmHg) mild aortic calcification but no stenosis. No source of emboli    WRIST SURGERY  Left    2 surgeries left wrist after fracture(complicated by DVT)     reports that she quit smoking about 61 years ago. She has never used smokeless tobacco. She reports that she does not drink alcohol or use drugs.  Allergies  Allergen Reactions   Buspirone Hcl Other (See Comments)    Headaches, twitching    Family History  Problem Relation Age of Onset   Stroke Mother    Osteoarthritis Mother        Died at age 30   Heart disease Father    Lung cancer Father    Heart failure Father        Died at age 47   Osteoporosis Sister    Diabetes Brother    Heart attack Brother 41   Diabetes Brother    Diabetes Brother  Heart attack Brother 52   Breast cancer Paternal Aunt    Heart failure Maternal Grandmother        Died at age 25, unsure of her heart disease   Heart failure Paternal Grandfather        Died at age 48, unsure of cardiac disease.    Prior to Admission medications   Medication Sig Start Date End Date Taking? Authorizing Provider  acetaminophen (TYLENOL) 325 MG tablet Take 2 tablets (650 mg total) by mouth every 6 (six) hours as needed for mild pain, fever or headache (or Fever >/= 101). 09/30/18   Eugenie Filler, MD  amitriptyline (ELAVIL) 25 MG tablet Take 0.5 tablets (12.5 mg total) by mouth at bedtime. 02/22/19   Kayleen Memos, DO  aspirin EC 81 MG EC tablet Take 1 tablet (81 mg total) by mouth daily. 02/22/19   Kayleen Memos, DO  ferrous sulfate 325 (65 FE) MG tablet Take 1 tablet (325 mg total) by mouth daily with breakfast. 02/22/19   Kayleen Memos, DO  FLUoxetine (PROZAC) 40 MG capsule Take 1 capsule (40 mg total) by mouth daily. 02/22/19   Kayleen Memos, DO  gabapentin (NEURONTIN) 300 MG capsule Take 1 capsule (300 mg total) by mouth 2 (two) times daily. 02/22/19   Kayleen Memos, DO  lansoprazole (PREVACID) 30 MG capsule Take 30 mg by mouth daily. 01/15/19   [provider]  metoprolol tartrate (LOPRESSOR) 25 MG tablet Take 1  tablet (25 mg total) by mouth 2 (two) times daily. 02/22/19   Kayleen Memos, DO  oxyCODONE (OXY IR/ROXICODONE) 5 MG immediate release tablet Take 1 tablet (5 mg total) by mouth every 8 (eight) hours as needed for moderate pain. 02/22/19   Kayleen Memos, DO  rosuvastatin (CRESTOR) 5 MG tablet Take 5 mg by mouth daily. 05/18/18   [provider]  torsemide (DEMADEX) 10 MG tablet Take 1 tablet (10 mg total) by mouth every other day as needed (for fluid). 02/17/19   Lavina Hamman, MD    Physical Exam:  Constitutional: Elderly female who appears in responsive at this time  Vitals:   03/17/2019 1152 03/21/2019 1153 03/21/2019 1154 02/28/2019 1155  BP:    (!) 65/41  Pulse: 69 70 68 65  Resp: 18 (!) _0 Temp:      TempSrc:      SpO2: 96% 97% 95% 96%   Eyes: lids and conjunctivae normal ENMT: Mucous membranes are dry.  Neck: normal, supple, no masses, no thyromegaly Respiratory: Intermittent episodes of apnea.  Currently on 2 L of nasal cannula oxygen. Cardiovascular: Bradycardic, positive 2/6 systolic ejection murmur appreciated. No extremity edema. 2+ pedal pulses. No carotid bruits.  Abdomen: no tenderness, no masses palpated. No hepatosplenomegaly. Bowel sounds positive.  Musculoskeletal: no clubbing / cyanosis. No joint deformity upper and lower extremities. Good ROM, no contractures. Normal muscle tone.  Skin: no rashes, lesions, ulcers. No induration Neurologic: CN 2-12 grossly intact.  Patient able to move extremities. Psychiatric: Unresponsive    Labs on Admission: I have personally reviewed following labs and imaging studies  CBC: Recent Labs  Lab 03/01/2019 1044  WBC 14.9*  NEUTROABS 13.0*  HGB 10.8*  HCT 36.9  MCV 98.4  PLT 6,389*   Basic Metabolic Panel: Recent Labs  Lab 02/27/2019 1044  NA 138  K 4.8  CL 103  CO2 21*  GLUCOSE 163*  BUN 26*  CREATININE 3.02*  CALCIUM 8.9  GFR: Estimated Creatinine Clearance: 14.8 mL/min (A) (by C-G formula based on  SCr of 3.02 mg/dL (H)). Liver Function Tests: Recent Labs  Lab 03/11/2019 1044  AST 26  ALT 15  ALKPHOS 81  BILITOT 0.6  PROT 6.8  ALBUMIN 3.7   No results for input(s): LIPASE, AMYLASE in the last 168 hours. No results for input(s): AMMONIA in the last 168 hours. Coagulation Profile: No results for input(s): INR, PROTIME in the last 168 hours. Cardiac Enzymes: No results for input(s): CKTOTAL, CKMB, CKMBINDEX, TROPONINI in the last 168 hours. BNP (last 3 results) No results for input(s): PROBNP in the last 8760 hours. HbA1C: No results for input(s): HGBA1C in the last 72 hours. CBG: Recent Labs  Lab 03/17/2019 1029  GLUCAP 153*   Lipid Profile: No results for input(s): CHOL, HDL, LDLCALC, TRIG, CHOLHDL, LDLDIRECT in the last 72 hours. Thyroid Function Tests: No results for input(s): TSH, T4TOTAL, FREET4, T3FREE, THYROIDAB in the last 72 hours. Anemia Panel: No results for input(s): VITAMINB12, FOLATE, FERRITIN, TIBC, IRON, RETICCTPCT in the last 72 hours. Urine analysis:    Component Value Date/Time   COLORURINE YELLOW 03/15/2019 1101   APPEARANCEUR CLEAR 03/23/2019 1101   LABSPEC 1.012 03/18/2019 1101   PHURINE 6.0 02/23/2019 1101   GLUCOSEU NEGATIVE 02/25/2019 1101   HGBUR NEGATIVE 03/02/2019 1101   BILIRUBINUR NEGATIVE 03/25/2019 1101   [place] 02/23/2019 1101   PROTEINUR NEGATIVE 03/06/2019 1101   NITRITE NEGATIVE 03/16/2019 [place] 03/06/2019 1101   Sepsis Labs: No results found for this or any previous visit (from the past 240 hour(s)).   Radiological Exams on Admission: CT Head Wo Contrast  Result Date: 03/10/2019 CLINICAL DATA:  Encephalopathy, altered mental status EXAM: CT HEAD WITHOUT CONTRAST TECHNIQUE: Contiguous axial images were obtained from the base of the skull through the vertex without intravenous contrast. COMPARISON:  CT head dated 12/15/2018 FINDINGS: Brain: No evidence of acute infarction, hemorrhage,  hydrocephalus, extra-axial collection or mass lesion/mass effect. Periventricular white matter hypoattenuation likely represents chronic small vessel ischemic disease. Encephalomalacia of the left parietal/occipital lobes is unchanged. Vascular: There are vascular calcifications in the carotid siphons. Skull: Normal. Negative for fracture or focal lesion. Sinuses/Orbits: None there is a bilateral sphenoid sinus disease. There is a trace right mastoid effusion. Other: None. IMPRESSION: 1. No acute intracranial process. Electronically Signed   By: Zerita Boers M.D.   On: 03/14/2019 09:57   DG Chest Portable 1 View  Result Date: 03/23/2019 CLINICAL DATA:  Shortness of breath with altered mental status EXAM: PORTABLE CHEST 1 VIEW COMPARISON:  February 21, 2019 FINDINGS: There is slight bibasilar atelectasis. Lungs elsewhere are clear. Heart size and pulmonary vascularity are normal. No adenopathy. Aorta is tortuous but stable. There is a total shoulder replacement on the left. There is postoperative change in the right shoulder region with marked superior migration of the right humeral head and widening of the acromioclavicular joint. There is postoperative change in the lower thoracic and visualized upper lumbar regions. IMPRESSION: Mild bibasilar atelectasis. No edema or consolidation. Stable cardiac silhouette. No adenopathy. Areas of postoperative change. Advanced arthropathy in the right shoulder. Electronically Signed   By: Lowella Grip III M.D.   On: 03/25/2019 10:03     Assessment/Plan SIRS Acute renal failure superimposed on chronic kidney disease stage IIIb Hypotension Acute metabolic encephalopathy Anemia of chronic disease Chronic diastolic congestive heart failure DNR present on admission Comfort care measures only Patient presents after being found at home altered  with nausea vomiting.  Patient met SIRS criteria and was hypotensive as low as 61/38, but no clear source of infection  was appreciated.  Labs signified acute renal failure superimposed on chronic kidney disease.  Family present at bedside and requests patient to be made comfort care.  - Admit to a MedSurg bed -End-of-life order set initiated - Discontinue cardiac monitoring - Routine vital sign checks - Discontinued home medications - N.p.o. - Okay for RN to pronounce death - Aspiration precautions - Maintain IV access - IV fluids KVO  - Continuous nasal cannula oxygen as needed for comfort - Morphine gtt - Zofran IV prn nausea/vomiting - Ativan IV prn anxiety - Haloperidol IV prn agitation or delirium - Consult to palliative care in a.m.    DVT prophylaxis: None  Code Status: DNR  Family Communication: Discuss plan care with patient's family present at bedside Disposition Plan: Possibly transfer to inpatient hospice or death Consults called: Palliative care Admission status: Inpatient   Norval Morton MD [facility] Pager [phone]   If 7PM-7AM, please contact night-coverage [email or web address] Password [facility]  02/28/2019, 12:04 PM

## 2019-03-05 DIAGNOSIS — R569 Unspecified convulsions: Secondary | ICD-10-CM

## 2019-03-05 LAB — PATHOLOGIST SMEAR REVIEW

## 2019-03-05 MED ORDER — LORAZEPAM BOLUS VIA INFUSION: 2.0000 mg | INTRAVENOUS | Status: DC | PRN

## 2019-03-05 MED ORDER — HYDROMORPHONE BOLUS VIA INFUSION
2.0000 mg | INTRAVENOUS | Status: DC | PRN
  Administered 2019-03-05: 19:00:00 2 mg via INTRAVENOUS
  Filled 2019-03-05: qty 2

## 2019-03-05 MED ORDER — MIDAZOLAM 50MG/50ML (1MG/ML) PREMIX INFUSION
2.0000 mg/h | INTRAVENOUS | Status: DC
  Administered 2019-03-05: 2 mg/h via INTRAVENOUS
  Filled 2019-03-05 (×2): qty 50

## 2019-03-05 MED ORDER — MIDAZOLAM BOLUS VIA INFUSION
4.0000 mg | INTRAVENOUS | Status: DC | PRN
  Filled 2019-03-05: qty 4

## 2019-03-05 MED ORDER — LORAZEPAM 2 MG/ML IJ SOLN
1.0000 mg | INTRAMUSCULAR | Status: AC
  Administered 2019-03-05: 1 mg via INTRAVENOUS
  Filled 2019-03-05: qty 1

## 2019-03-05 MED ORDER — GLYCOPYRROLATE 0.2 MG/ML IJ SOLN
0.4000 mg | Freq: Three times a day (TID) | INTRAMUSCULAR | Status: DC
  Administered 2019-03-05: 16:00:00 0.4 mg via INTRAVENOUS
  Filled 2019-03-05: qty 2

## 2019-03-05 MED ORDER — LORAZEPAM 2 MG/ML IJ SOLN: 2.0000 mg | INTRAMUSCULAR | Status: DC | PRN

## 2019-03-05 MED ORDER — LORAZEPAM 2 MG/ML IJ SOLN: 1.0000 mg/h | INTRAVENOUS | Status: DC

## 2019-03-05 MED ORDER — LORAZEPAM 2 MG/ML IJ SOLN: 2.0000 mg | INTRAMUSCULAR | Status: AC | PRN

## 2019-03-05 MED ORDER — LORAZEPAM 2 MG/ML IJ SOLN
INTRAMUSCULAR | Status: AC
  Administered 2019-03-05: 12:00:00 2 mg
  Filled 2019-03-05: qty 1

## 2019-03-05 MED ORDER — LORAZEPAM 2 MG/ML IJ SOLN: 2.0000 mg | Freq: Once | INTRAMUSCULAR | Status: AC

## 2019-03-26 NOTE — Care Management (Signed)
Consult for home with hospice. Per palliative note 1/10 patient actively dying and anticipate hospital death. Will continue to follow.   Magdalen Spatz RN

## 2019-03-26 NOTE — Progress Notes (Signed)
Palliative Medicine RN Note: Rec'd request from PMT PA Florentina Jenny to check on Shirley [name], as RN Percell Locus had paged her for twitching despite Dilaudid boluses. Haynes Dage ordered a 2mg  Ativan dose.  I arrived to the floor; Percell Locus was leaving and reported that she was aware of order & would administer when she returned. She reported pt was still having seizure-like activity. I got charge RN Drue Dun to administer medication and assist with repositioning. Mouth care completed. Family has lip gloss they've been using; it's thicker than what the hospital has, so I recommended they continue using it for her. I left mouth swabs with the family, and we discussed making sure that they are damp but not wet to minimize fluid build-up in her mouth.   Shirley [name]'s family is very concerned that she is not comfortable. They describe frequent grimacing, and the "twitching" is very bothersome. This appears to be more as myoclonic activity, and it did mostly resolve with 2 mg Ativan. Her family describes many decades of high-dose use of opioids due to various physical issues, and they're concerned that our doses are not high enough for her.  After my visit with Shirley [name], I contacted PA Haynes Dage about my concerns, and she gave orders to adjust the medication orders. I confirmed that the family has our team contact information.  Shirley Skiff Lynett Brasil, RN, BSN, [facility] Palliative Medicine Team 03/06/2019 8:42 AM Office [phone]

## 2019-03-26 NOTE — Progress Notes (Signed)
Wasted in stericycle 32ml of IV dilaudid and 26 mls of Versed IV medication was drawn up with syringe and wasted with Marya Landry RN patient had expired.

## 2019-03-26 NOTE — Progress Notes (Signed)
Nutrition Brief Note RD working remotely. Chart reviewed. Pt now transitioning to comfort care.  No further nutrition interventions warranted at this time.  Please re-consult as needed.   Deatrice Spanbauer A. Ahmani Daoud, RD, LDN, CDCES Registered Dietitian II Certified Diabetes Care and Education Specialist Pager: [phone] After hours Pager: [phone]  

## 2019-03-26 NOTE — Progress Notes (Addendum)
Daily Progress Note   Patient Name: Shirley [name]       Date: 03-31-19 DOB: 18-Apr-1940  Age: 79 y.o. MRN#: [id number] Attending Physician: Lorella Nimrod, MD Primary Care Physician: Ardith Dark, PA-C Admit Date: 02/25/2019  Reason for Consultation/Follow-up: Non pain symptom management, Pain control, Psychosocial/spiritual support and Terminal Care  Called by bedside RN Percell Locus and patient was having seizure like activity.  She seemed in distress despite receiving PRNs. Patient seen by Palliative RN earlier this afternoon.  Dilaudid drip rate increased and Versed drip initiated for tonic clonic seizure activity.  Subjective: Family at bedside is tired.  They express that even though she is comfortable now it is very hard to sit at bedside.  Husband is tearful.  He asks if there is anything else he can do.   Family concerned about sound of secretions in the back of her throat.  We discussed why this happens and that it is not noticeable to her - even though it sounds bad to [place].   Assessment: Patient actively dying.   Tonic clonic seizure like activity subsided with increased opioids and the addition of a benzodiazepine gtt..     Patient Profile/HPI:  79 y.o. female  with past medical history of chronic pain, many orthopedic surgeries, DVT, falls, CKD 3, CHF, asthma and esophageal dysphagia who was admitted on 03/09/2019 for end of life comfort care.  She had been found down after nausea and vomiting.  Labs indicate that her kidney have failed and her platelets are over 1000.   PMT was contacted by the attending for severe symptoms.    Length of Stay: 1  Current Medications: Scheduled Meds:  . glycopyrrolate  0.4 mg Intravenous TID    Continuous Infusions: . HYDROmorphone 3 mg/hr  (03/31/19 1334)  . midazolam      PRN Meds: acetaminophen **OR** acetaminophen, diphenhydrAMINE, haloperidol **OR** haloperidol **OR** haloperidol lactate, HYDROmorphone, LORazepam, midazolam, ondansetron (ZOFRAN) IV, scopolamine  Physical Exam       Well developed woman with family at bedside.  She is non-responsive CV rrr resp coarse breath sounds. Abdomen soft, nt, nd  Vital Signs: BP (!) 89/53 (BP Location: Right Arm)   Pulse 84   Temp 99.1 F (37.3 C) (Oral)   Resp 15   SpO2 95%  SpO2: SpO2: 95 % O2  Device: O2 Device: Nasal Cannula O2 Flow Rate: O2 Flow Rate (L/min): 2 L/min  Intake/output summary:   Intake/Output Summary (Last 24 hours) at March 30, 2019 1542 Last data filed at 03/30/19 1300 Gross per 24 hour  Intake 0 ml  Output 0 ml  Net 0 ml   LBM:   Baseline Weight:   Most recent weight:         Palliative Assessment/Data:  10%      Patient Active Problem List   Diagnosis Date Noted  . Comfort measures only status 03/03/2019  . SIRS (systemic inflammatory response syndrome) ([place]) 03/09/2019  . Terminal care   . Palliative care encounter   . Acute respiratory failure with hypoxia ([place])   . Fracture of hardware in spine ([place]) 02/14/2019  . Leg laceration 02/14/2019  . Dehydration 09/30/2018  . CKD (chronic kidney disease), stage III 09/28/2018  . Stroke ([place]) 09/28/2018  . Depression with anxiety 09/28/2018  . Chronic diastolic CHF (congestive heart failure) ([place]) 09/28/2018  . Right sided weakness 09/28/2018  . Asthma   . Frequent falls   . 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 OA (osteoarthritis) of hip 08/18/2016  . Pre-operative cardiovascular examination 07/16/2016  . Recurrent falls 04/16/2016  . Chronic pain   . Hypertension   . Neuropathy   . Osteopenia   . Vitamin D deficiency   . Olecranon fracture 04/15/2016  . Fall at home, initial encounter   . Fall   . Anemia   . Post-operative pain   . Fibromyalgia   . Uncomplicated  asthma   . Tobacco abuse   . AKI (acute kidney injury) ([place])   . Leukocytosis   . Other secondary hypertension   . Hyperglycemia   . Acute blood loss anemia   . Thrombocytosis ([place])   . Closed fracture of left olecranon process   . Acute renal failure superimposed on chronic kidney disease ([place]) 03/18/2016  . Acute metabolic encephalopathy 57/26/2035  . Elbow fracture, left, closed, initial encounter 03/18/2016  . Acute pain of left hip 12/23/2015  . Acute lower UTI 12/23/2015  . DNR (do not resuscitate) discussion 12/23/2015  . Palliative care by specialist 12/23/2015  . Chronic pain syndrome 12/23/2015  . Left hip pain 12/23/2015  . Effusion of left hip   . Lytic bone lesions on xray   . Exertional dyspnea 09/07/2012  . Varicose veins of both lower extremities with pain 08/27/2012  . Lower extremity edema 08/27/2012  . Abnormal resting ECG findings - sinus rhythm with PACs, and PAT 08/19/2012  . Aortic systolic murmur on examination 08/19/2012  . PAT (paroxysmal atrial tachycardia) / MAT 08/19/2012    Palliative Care Plan    Recommendations/Plan:  Continue comfort measures.  Hopeful that increased opioid gtt and addition of benzo gtt will keep her comfortable and control seizures.  Will add scheduled robinul for secretions.  If seizures are refractory we may need to consider propofol.  Goals of Care and Additional Recommendations:  Limitations on Scope of Treatment: Full Comfort Care  Code Status:  DNR  Prognosis:   Hours - Days   Discharge Planning:  Anticipated Hospital Death  Care plan was discussed with Palliative RN, Bedside RN, family.  Thank you for allowing the Palliative Medicine Team to assist in the care of this patient.  Total time spent:  25 min.     Greater than 50%  of this time was spent counseling and coordinating care related to the above assessment and plan.  Florentina Jenny, PA-C Palliative Medicine  Please contact Palliative  MedicineTeam phone at [phone] for questions and concerns between 7 am - 7 pm.   Please see AMION for individual provider pager numbers.

## 2019-03-26 NOTE — Progress Notes (Signed)
PROGRESS NOTE    LIYAT FAULKENBERRY  YKD:[id number] DOB: 04/10/1940 DOA: 2019/04/02 PCP: Ardith Dark, PA-C   Brief Narrative:  JENAFER WINTERTON is a 79 y.o. female with medical history significant of asthma, CHF, PAT, neuropathy, fibromyalgia, chronic pain, GERD.  She presents with family after being found to be acutely altered nausea and vomiting this morning.  Patient had just been recently hospitalized from 12/27-12/30 for acute respiratory failure with hypoxia, AKI superimposed on chronic kidney disease, possible UTI, and pulmonary edema secondary to IV fluids resolved with IV diuretics.  At discharge patient went home with home hospice. Family does not want any aggressive measures and want her to be comfortable only.  Subjective: Patient appears comfortable with some gurgling sounds which is very distressful for the family.  Having some apneic spells.  On morphine gtt.  Assessment & Plan:   Principal Problem:   Comfort measures only status Active Problems:   DNR (do not resuscitate) discussion   Acute renal failure superimposed on chronic kidney disease (HCC)   Leukocytosis   Thrombocytosis (HCC)   Chronic diastolic CHF (congestive heart failure) (HCC)   SIRS (systemic inflammatory response syndrome) (HCC)   Terminal care   Palliative care encounter   Convulsions ([place])  SIRS Acute renal failure superimposed on chronic kidney disease stage IIIb Hypotension Acute metabolic encephalopathy Anemia of chronic disease Chronic diastolic congestive heart failure DNR present on admission Comfort care measures only. Palliative care was consulted and patient was placed on comfort measures. Morphine GTT was started. Continue with comfort measures. Actively dying-anticipate hospital death  Objective: Vitals:   Apr 02, 2019 1220 04/02/19 1225 Apr 02, 2019 1339 04/03/2019 0631  BP: (!) 66/40 (!) 71/41 (!) 86/53 (!) 89/53  Pulse: 65 71 71 84  Resp: 12 17 (!) 8 15  Temp:   98.1 F (36.7 C)  99.1 F (37.3 C)  TempSrc:   Oral Oral  SpO2: 97% 98% 100% 95%    Intake/Output Summary (Last 24 hours) at Apr 03, 2019 1705 Last data filed at April 03, 2019 1300 Gross per 24 hour  Intake 0 ml  Output 0 ml  Net 0 ml   There were no vitals filed for this visit.  Examination:  General exam: Appears calm and comfortable  Respiratory system: Clear to auscultation.  Brief apneic spells. Cardiovascular system: S1 & S2 heard, RRR.  Gastrointestinal system: Soft, nontender, nondistended, bowel sounds positive.  DVT prophylaxis: None is on comfort measures. Code Status: DNR Family Communication: Multiple family members at bedside Disposition Plan: Anticipate hospital death.  Consultants:   Palliative  Procedures:  Antimicrobials:   Data Reviewed: I have personally reviewed following labs and imaging studies  CBC: Recent Labs  Lab 04-02-19 1044  WBC 14.9*  NEUTROABS 13.0*  HGB 10.8*  HCT 36.9  MCV 98.4  PLT 3,976*   Basic Metabolic Panel: Recent Labs  Lab Apr 02, 2019 1044  NA 138  K 4.8  CL 103  CO2 21*  GLUCOSE 163*  BUN 26*  CREATININE 3.02*  CALCIUM 8.9   GFR: Estimated Creatinine Clearance: 14.8 mL/min (A) (by C-G formula based on SCr of 3.02 mg/dL (H)). Liver Function Tests: Recent Labs  Lab 04-02-2019 1044  AST 26  ALT 15  ALKPHOS 81  BILITOT 0.6  PROT 6.8  ALBUMIN 3.7   No results for input(s): LIPASE, AMYLASE in the last 168 hours. No results for input(s): AMMONIA in the last 168 hours. Coagulation Profile: No results for input(s): INR, PROTIME in the last 168 hours. Cardiac Enzymes: No  results for input(s): CKTOTAL, CKMB, CKMBINDEX, TROPONINI in the last 168 hours. BNP (last 3 results) No results for input(s): PROBNP in the last 8760 hours. HbA1C: No results for input(s): HGBA1C in the last 72 hours. CBG: Recent Labs  Lab 03/20/2019 1029  GLUCAP 153*   Lipid Profile: No results for input(s): CHOL, HDL, LDLCALC, TRIG, CHOLHDL, LDLDIRECT in  the last 72 hours. Thyroid Function Tests: No results for input(s): TSH, T4TOTAL, FREET4, T3FREE, THYROIDAB in the last 72 hours. Anemia Panel: No results for input(s): VITAMINB12, FOLATE, FERRITIN, TIBC, IRON, RETICCTPCT in the last 72 hours. Sepsis Labs: Recent Labs  Lab 03/23/2019 1143  LATICACIDVEN 1.9    Recent Results (from the past 240 hour(s))  SARS CORONAVIRUS 2 (TAT 6-24 HRS) Nasopharyngeal Nasopharyngeal Swab     Status: None   Collection Time: 03/10/2019 12:01 PM   Specimen: Nasopharyngeal Swab  Result Value Ref Range Status   SARS Coronavirus 2 NEGATIVE NEGATIVE Final    Comment: (NOTE) SARS-CoV-2 target nucleic acids are NOT DETECTED. The SARS-CoV-2 RNA is generally detectable in upper and lower respiratory specimens during the acute phase of infection. Negative results do not preclude SARS-CoV-2 infection, do not rule out co-infections with other pathogens, and should not be used as the sole basis for treatment or other patient management decisions. Negative results must be combined with clinical observations, patient history, and epidemiological information. The expected result is Negative. Fact Sheet for Patients: SugarRoll.be Fact Sheet for Healthcare Providers: https://www.woods-mathews.com/ This test is not yet approved or cleared by the [place] FDA and  has been authorized for detection and/or diagnosis of SARS-CoV-2 by FDA under an Emergency Use Authorization (EUA). This EUA will remain  in effect (meaning this test can be used) for the duration of the COVID-19 declaration under Section 56 4(b)(1) of the Act, 21 U.S.C. section 360bbb-3(b)(1), unless the authorization is terminated or revoked sooner. Performed at [facility], [place] [address] [place], [place] [address]      Radiology Studies: CT Head Wo Contrast  Result Date: 02/26/2019 CLINICAL DATA:  Encephalopathy, altered mental status EXAM: CT  HEAD WITHOUT CONTRAST TECHNIQUE: Contiguous axial images were obtained from the base of the skull through the vertex without intravenous contrast. COMPARISON:  CT head dated 12/15/2018 FINDINGS: Brain: No evidence of acute infarction, hemorrhage, hydrocephalus, extra-axial collection or mass lesion/mass effect. Periventricular white matter hypoattenuation likely represents chronic small vessel ischemic disease. Encephalomalacia of the left parietal/occipital lobes is unchanged. Vascular: There are vascular calcifications in the carotid siphons. Skull: Normal. Negative for fracture or focal lesion. Sinuses/Orbits: None there is a bilateral sphenoid sinus disease. There is a trace right mastoid effusion. Other: None. IMPRESSION: 1. No acute intracranial process. Electronically Signed   By: Zerita Boers M.D.   On: 02/25/2019 09:57   DG Chest Portable 1 View  Result Date: 03/19/2019 CLINICAL DATA:  Shortness of breath with altered mental status EXAM: PORTABLE CHEST 1 VIEW COMPARISON:  February 21, 2019 FINDINGS: There is slight bibasilar atelectasis. Lungs elsewhere are clear. Heart size and pulmonary vascularity are normal. No adenopathy. Aorta is tortuous but stable. There is a total shoulder replacement on the left. There is postoperative change in the right shoulder region with marked superior migration of the right humeral head and widening of the acromioclavicular joint. There is postoperative change in the lower thoracic and visualized upper lumbar regions. IMPRESSION: Mild bibasilar atelectasis. No edema or consolidation. Stable cardiac silhouette. No adenopathy. Areas of postoperative change. Advanced arthropathy in  the right shoulder. Electronically Signed   By: Lowella Grip III M.D.   On: 03/03/2019 10:03    Scheduled Meds: . glycopyrrolate  0.4 mg Intravenous TID   Continuous Infusions: . HYDROmorphone 3 mg/hr (Mar 10, 2019 1334)  . midazolam 2 mg/hr (2019-03-10 1610)     LOS: 1 day   Time  spent: 20 minutes.  Lorella Nimrod, MD [facility] Pager [phone]  If 7PM-7AM, please contact night-coverage [email or web address] Password [facility] 03-10-19, 5:05 PM   This record has been created using Systems analyst. Errors have been sought and corrected,but may not always be located. Such creation errors do not reflect on the standard of care.

## 2019-03-26 NOTE — Discharge Summary (Signed)
Death Summary  Shirley [name] IWP:[id number] DOB: [birth year] DOA: Mar 21, 2019  PCP: Ardith Dark, PA-C  Admit date: Mar 21, 2019 Date of Death: 2019-03-22 Time of Death: 7 PM Notification: Ardith Dark, PA-C notified of death of 03-24-19   History of present illness:  Shirley [name] is a 79 y.o. female with a history of asthma,CHF,PAT, neuropathy, fibromyalgia, chronic pain, GERD. Patient had just been recently hospitalized from 12/27-12/30 foracute respiratory failure with hypoxia, AKI superimposed on chronic kidney disease, possible UTI, and pulmonary edema secondary to IV fluids resolved with IV diuretics. At discharge patient went home with home hospice. Shirley [name] presented with complaint of altered mental status with nausea and vomiting.  Some concern of aspiration.  Family decided to make her comfort care. Palliative care was consulted and patient was started on comfort measures. She was accompanied by her family at the time of death.  Final Diagnoses:    Acute respiratory failure Metabolic encephalopathy Diastolic heart failure.   The results of significant diagnostics from this hospitalization (including imaging, microbiology, ancillary and laboratory) are listed below for reference.    Significant Diagnostic Studies: DG Chest 1 View  Result Date: 02/14/2019 CLINICAL DATA:  Altered behavior, low back pain, pelvic pain, distal LEFT lower leg laceration, fell at home today, history asthma, heart murmur, hypertension, pneumonia, spinal surgery and scoliosis, former smoker, initial encounter EXAM: CHEST  1 VIEW COMPARISON:  09/28/2018 FINDINGS: Rotated to the RIGHT. Upper normal heart size. Tortuous aorta. Mediastinal contours and pulmonary vascularity grossly stable. RIGHT basilar atelectasis without infiltrate, pleural effusion or pneumothorax. Bones demineralized with note of prior LEFT shoulder replacement and thoracolumbar spinal fusion. Single screw noted at RIGHT  infra glenoid region of scapula. Degenerative changes of RIGHT glenohumeral joint with chronic rotator cuff tear suspected. IMPRESSION: RIGHT basilar atelectasis. Electronically Signed   By: Lavonia Dana M.D.   On: 02/14/2019 20:52   DG Lumbar Spine Complete  Result Date: 02/14/2019 CLINICAL DATA:  Altered behavior, low back pain, pelvic pain, distal LEFT lower leg laceration, fell at home today, history asthma, heart murmur, hypertension, pneumonia, spinal surgery and scoliosis, former smoker, initial encounter EXAM: LUMBAR SPINE - COMPLETE 4+ VIEW COMPARISON:  None FINDINGS: Osseous demineralization. Prior thoracolumbar fusion extending from T10 into sacrum. Fracture of the RIGHT fusion rod inferior to the RIGHT S1 pedicle screw. Remaining hardware appears intact. Fixation hardware extends across RIGHT SI joint into RIGHT iliac bone. Dextroconvex scoliosis. No fracture, subluxation or bone destruction. Atherosclerotic calcifications aorta. IMPRESSION: Prior thoracolumbosacral fusion with fracture of the RIGHT spinal rod inferior to the RIGHT S1 pedicle screw. Dextroconvex scoliosis. Osseous demineralization without definite acute bony abnormality. Electronically Signed   By: Lavonia Dana M.D.   On: 02/14/2019 20:51   DG Pelvis 1-2 Views  Result Date: 02/14/2019 CLINICAL DATA:  Altered behavior, low back pain, pelvic pain, distal LEFT lower leg laceration, fell at home today, history asthma, heart murmur, hypertension, pneumonia, spinal surgery and scoliosis, former smoker, initial encounter EXAM: PELVIS - 1-2 VIEW COMPARISON:  None FINDINGS: Prior lumbo spinal fusion with a RIGHT-side screw extending into RIGHT iliac bone. Fracture of the RIGHT spinal rod inferior to the RIGHT S1 pedicle screw. Post LEFT total hip arthroplasty. Pelvis intact. No acute fracture, dislocation, or bone destruction identified. IMPRESSION: Fracture of the RIGHT spinal rod inferior to the RIGHT S1 pedicle screw. LEFT hip  prosthesis. No acute fracture or dislocation identified. Electronically Signed   By: Lavonia Dana M.D.   On: 02/14/2019 20:54  DG Tibia/Fibula Left  Result Date: 02/14/2019 CLINICAL DATA:  Altered behavior, low back pain, pelvic pain, distal LEFT lower leg laceration, fell at home today, history asthma, heart murmur, hypertension, pneumonia, spinal surgery and scoliosis, former smoker, initial encounter EXAM: LEFT TIBIA AND FIBULA - 2 VIEW COMPARISON:  None FINDINGS: Osseous demineralization. Knee and ankle joint alignments normal. Chondrocalcinosis LEFT knee question CPPD. No acute fracture, dislocation, or bone destruction. Dorsal hardware at mid LEFT foot question TMT fusion. IMPRESSION: No acute osseous abnormalities. Electronically Signed   By: Lavonia Dana M.D.   On: 02/14/2019 20:58   CT Head Wo Contrast  Result Date: 03/01/2019 CLINICAL DATA:  Encephalopathy, altered mental status EXAM: CT HEAD WITHOUT CONTRAST TECHNIQUE: Contiguous axial images were obtained from the base of the skull through the vertex without intravenous contrast. COMPARISON:  CT head dated 12/15/2018 FINDINGS: Brain: No evidence of acute infarction, hemorrhage, hydrocephalus, extra-axial collection or mass lesion/mass effect. Periventricular white matter hypoattenuation likely represents chronic small vessel ischemic disease. Encephalomalacia of the left parietal/occipital lobes is unchanged. Vascular: There are vascular calcifications in the carotid siphons. Skull: Normal. Negative for fracture or focal lesion. Sinuses/Orbits: None there is a bilateral sphenoid sinus disease. There is a trace right mastoid effusion. Other: None. IMPRESSION: 1. No acute intracranial process. Electronically Signed   By: Zerita Boers M.D.   On: 03/09/2019 09:57   CT Head  Result Date: 02/14/2019 CLINICAL DATA:  Fall, head trauma EXAM: CT HEAD WITHOUT CONTRAST TECHNIQUE: Contiguous axial images were obtained from the base of the skull through  the vertex without intravenous contrast. COMPARISON:  September 28, 2018 FINDINGS: Brain: No evidence of acute territorial infarction, hemorrhage, hydrocephalus,extra-axial collection or mass lesion/mass effect. Prior left parietooccipital area encephalomalacia. There is dilatation the ventricles and sulci consistent with age-related atrophy. Low-attenuation changes in the deep white matter consistent with small vessel ischemia. Vascular: No hyperdense vessel or unexpected calcification. Skull: The skull is intact. No fracture or focal lesion identified. Sinuses/Orbits: The visualized paranasal sinuses and mastoid air cells are clear. The orbits and globes intact. Other: None IMPRESSION: No acute intracranial abnormality. Findings consistent with age related atrophy and chronic small vessel ischemia Prior left parietooccipital lobe infarct. Electronically Signed   By: Prudencio Pair M.D.   On: 02/14/2019 20:10   US RENAL  Result Date: 02/14/2019 CLINICAL DATA:  79 year old female with acute renal insufficiency. EXAM: RENAL / URINARY TRACT ULTRASOUND COMPLETE COMPARISON:  Renal ultrasound dated 03/18/2016. FINDINGS: Right Kidney: Renal measurements: 8.6 x 3.9 x 3.6 cm = volume: 63 mL. Increased renal echogenicity. No hydronephrosis or shadowing stone. Left Kidney: Renal measurements: 8.0 x 3.6 x 3.1 cm = volume: 46 mL. Increased renal echogenicity. No hydronephrosis or shadowing stone. A 1 cm upper pole cyst again noted. Bladder: Appears normal for degree of bladder distention. Other: None. IMPRESSION: Increased renal echogenicity in keeping with chronic kidney disease. No hydronephrosis or shadowing stone. Electronically Signed   By: Anner Crete M.D.   On: 02/14/2019 23:58   DG Chest Portable 1 View  Result Date: 03/17/2019 CLINICAL DATA:  Shortness of breath with altered mental status EXAM: PORTABLE CHEST 1 VIEW COMPARISON:  February 21, 2019 FINDINGS: There is slight bibasilar atelectasis. Lungs elsewhere  are clear. Heart size and pulmonary vascularity are normal. No adenopathy. Aorta is tortuous but stable. There is a total shoulder replacement on the left. There is postoperative change in the right shoulder region with marked superior migration of the right humeral head and widening of  the acromioclavicular joint. There is postoperative change in the lower thoracic and visualized upper lumbar regions. IMPRESSION: Mild bibasilar atelectasis. No edema or consolidation. Stable cardiac silhouette. No adenopathy. Areas of postoperative change. Advanced arthropathy in the right shoulder. Electronically Signed   By: Lowella Grip III M.D.   On: 03/25/2019 10:03   DG CHEST PORT 1 VIEW  Result Date: 02/21/2019 CLINICAL DATA:  Cough and shortness of breath EXAM: PORTABLE CHEST 1 VIEW COMPARISON:  Three days ago FINDINGS: Low volume chest with hazy and streaky density at the bases. No effusion or pneumothorax. Normal heart size when accounting for rotation. 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 Spinal fixation with scoliosis. IMPRESSION: Stable low volume chest with infiltrate or atelectasis at the bases. Electronically Signed   By: Monte Fantasia M.D.   On: 02/21/2019 08:14   DG Chest Portable 1 View  Result Date: 02/18/2019 CLINICAL DATA:  Shortness of breath. EXAM: PORTABLE CHEST 1 VIEW COMPARISON:  02/14/2019 and 09/28/2018 FINDINGS: Heart size and pulmonary vascularity are normal. The lungs are clear except for some minimal atelectasis at the right lung base, unchanged. No acute bone abnormality. IMPRESSION: No acute abnormalities. Minimal atelectasis at the right base, unchanged. Electronically Signed   By: Lorriane Shire M.D.   On: 02/18/2019 21:09    Microbiology: Recent Results (from the past 240 hour(s))  SARS CORONAVIRUS 2 (TAT 6-24 HRS) Nasopharyngeal Nasopharyngeal Swab     Status: None   Collection Time: 02/26/2019 12:01 PM   Specimen: Nasopharyngeal Swab    Result Value Ref Range Status   SARS Coronavirus 2 NEGATIVE NEGATIVE Final    Comment: (NOTE) SARS-CoV-2 target nucleic acids are NOT DETECTED. The SARS-CoV-2 RNA is generally detectable in upper and lower respiratory specimens during the acute phase of infection. Negative results do not preclude SARS-CoV-2 infection, do not rule out co-infections with other pathogens, and should not be used as the sole basis for treatment or other patient management decisions. Negative results must be combined with clinical observations, patient history, and epidemiological information. The expected result is Negative. Fact Sheet for Patients: SugarRoll.be Fact Sheet for Healthcare Providers: https://www.woods-mathews.com/ This test is not yet approved or cleared by the [place] FDA and  has been authorized for detection and/or diagnosis of SARS-CoV-2 by FDA under an Emergency Use Authorization (EUA). This EUA will remain  in effect (meaning this test can be used) for the duration of the COVID-19 declaration under Section 56 4(b)(1) of the Act, 21 U.S.C. section 360bbb-3(b)(1), unless the authorization is terminated or revoked sooner. Performed at [facility], [place] [address], [place], [place] [address]      Labs: Basic Metabolic Panel: Recent Labs  Lab 03/19/2019 1044  NA 138  K 4.8  CL 103  CO2 21*  GLUCOSE 163*  BUN 26*  CREATININE 3.02*  CALCIUM 8.9   Liver Function Tests: Recent Labs  Lab 03/07/2019 1044  AST 26  ALT 15  ALKPHOS 81  BILITOT 0.6  PROT 6.8  ALBUMIN 3.7   No results for input(s): LIPASE, AMYLASE in the last 168 hours. No results for input(s): AMMONIA in the last 168 hours. CBC: Recent Labs  Lab 03/03/2019 1044  WBC 14.9*  NEUTROABS 13.0*  HGB 10.8*  HCT 36.9  MCV 98.4  PLT 1,011*   Cardiac Enzymes: No results for input(s): CKTOTAL, CKMB, CKMBINDEX, TROPONINI in the last 168 hours. D-Dimer No results  for input(s): DDIMER in the last 72 hours. BNP: Invalid input(s):  POCBNP CBG: Recent Labs  Lab 03/01/2019 1029  GLUCAP 153*   Anemia work up No results for input(s): VITAMINB12, FOLATE, FERRITIN, TIBC, IRON, RETICCTPCT in the last 72 hours. Urinalysis    Component Value Date/Time   COLORURINE YELLOW 03/19/2019 1101   APPEARANCEUR CLEAR 02/28/2019 1101   LABSPEC 1.012 02/25/2019 1101   PHURINE 6.0 03/03/2019 1101   GLUCOSEU NEGATIVE 03/24/2019 1101   HGBUR NEGATIVE 03/11/2019 1101   BILIRUBINUR NEGATIVE 03/21/2019 1101   [place] 03/12/2019 1101   PROTEINUR NEGATIVE 02/28/2019 1101   NITRITE NEGATIVE 03/21/2019 1101   LEUKOCYTESUR NEGATIVE 03/24/2019 1101   Sepsis Labs Invalid input(s): PROCALCITONIN,  WBC,  LACTICIDVEN  SIGNED:  Lorella Nimrod, MD  [facility] 03/07/2019, 4:44 PM Pager   If 7PM-7AM, please contact night-coverage [email or web address] Password [email or web address]  This record has been created using Systems analyst. Errors have been sought and corrected,but may not always be located. Such creation errors do not reflect on the standard of care.

## 2019-03-26 DEATH — deceased

## 2019-12-27 ENCOUNTER — Ambulatory Visit: Payer: PPO | Admitting: Family Medicine
